# Patient Record
Sex: Male | Born: 1935 | Race: Black or African American | Hispanic: No | Marital: Married | Smoking: Never smoker
Health system: Southern US, Community
[De-identification: ages and names within clinical notes are randomized; demographics above are authoritative.]

## PROBLEM LIST (undated history)

## (undated) DIAGNOSIS — I639 Cerebral infarction, unspecified: Secondary | ICD-10-CM

## (undated) DIAGNOSIS — F0280 Dementia in other diseases classified elsewhere without behavioral disturbance: Secondary | ICD-10-CM

## (undated) DIAGNOSIS — F0390 Unspecified dementia without behavioral disturbance: Secondary | ICD-10-CM

## (undated) DIAGNOSIS — E119 Type 2 diabetes mellitus without complications: Secondary | ICD-10-CM

## (undated) NOTE — *Deleted (*Deleted)
Medical screening examination/treatment/procedure(s) were conducted as a shared visit with non-physician practitioner(s) and myself.  I personally evaluated the patient during the encounter  

---

## 2010-12-18 ENCOUNTER — Observation Stay: Payer: Self-pay | Admitting: *Deleted

## 2010-12-18 IMAGING — CT CT HEAD WITHOUT CONTRAST
2 series · 16 of 30 positions shown, 20 images · non-contrast
Comparison: none

REASON FOR EXAM: weakness
COMMENTS:

[Series 2: without · axial · non-contrast · 0.45mm/px · z∈[-140,-14]mm · 13 of 31 slices shown, 17 images]
[im 3/31  brain]
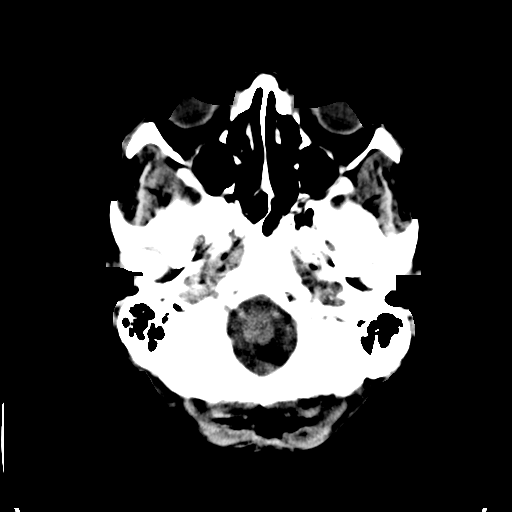
[im 3/31  bone]
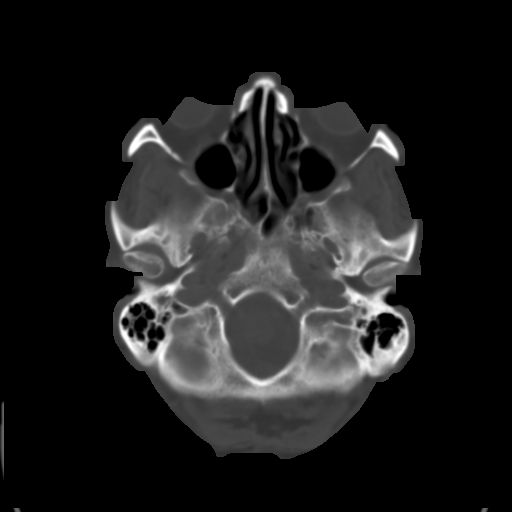
[im 5/31  brain]
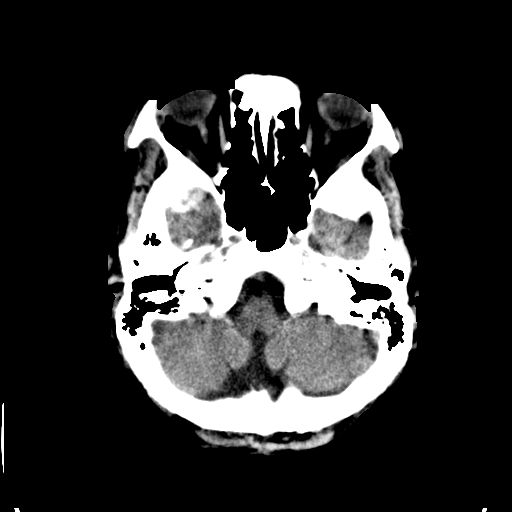
[im 7/31  brain]
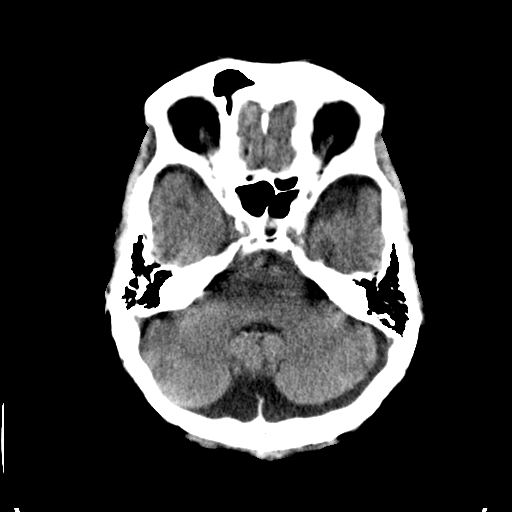
[im 9/31  brain]
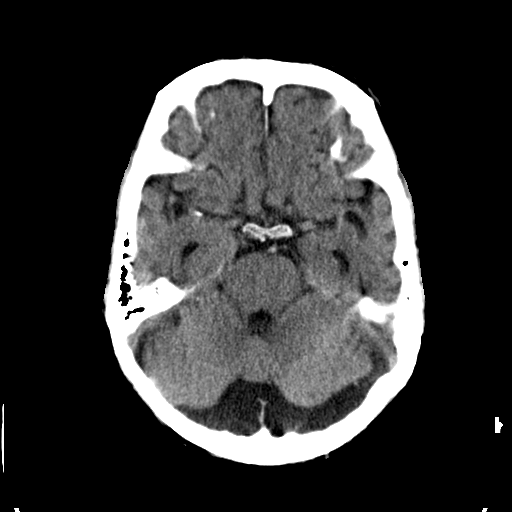
[im 11/31  brain]
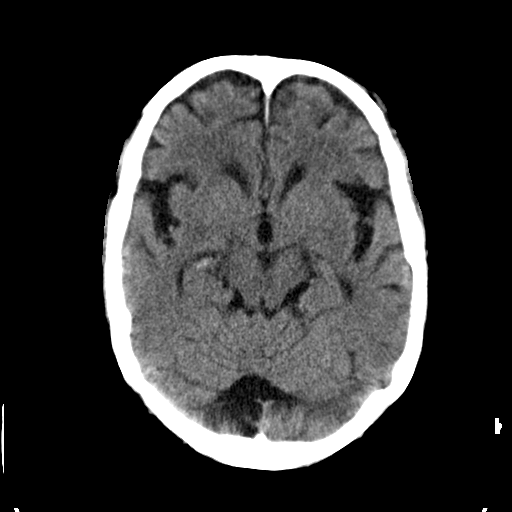
[im 11/31  bone]
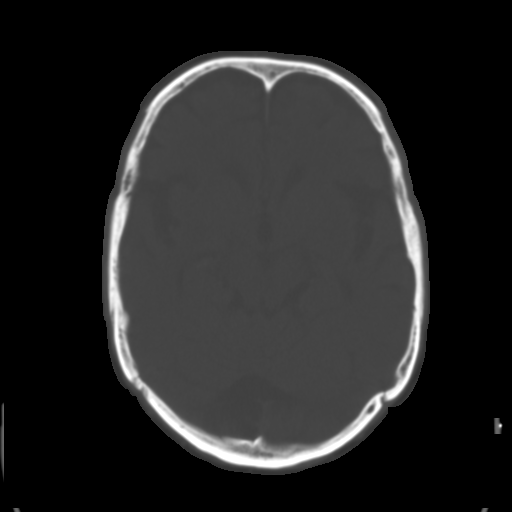
[im 13/31  brain]
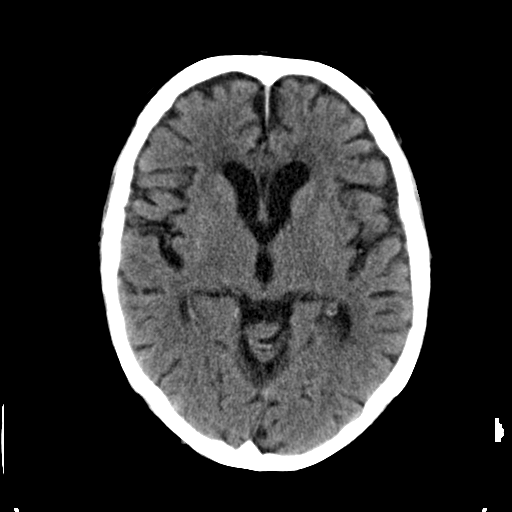
[im 16/31  brain]
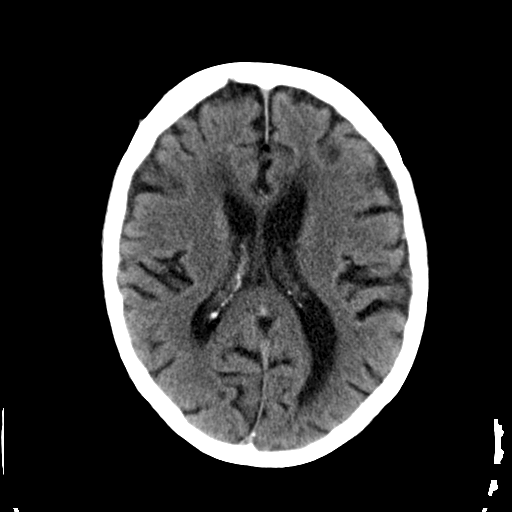
[im 18/31  brain]
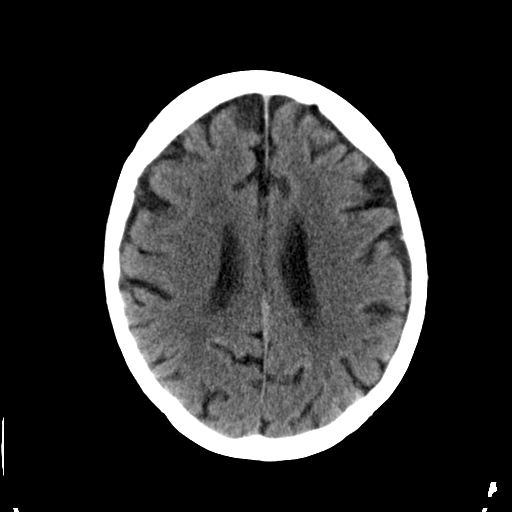
[im 20/31  brain]
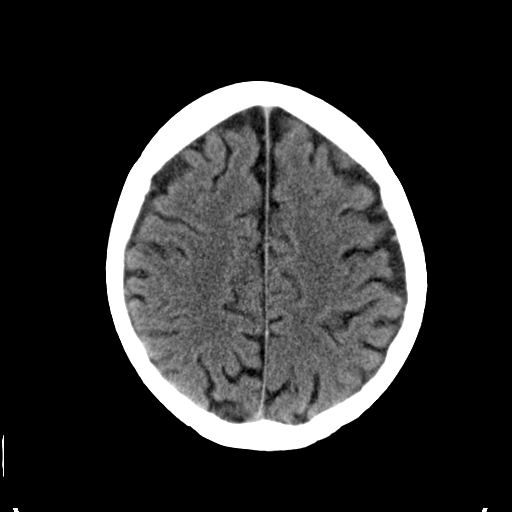
[im 20/31  bone]
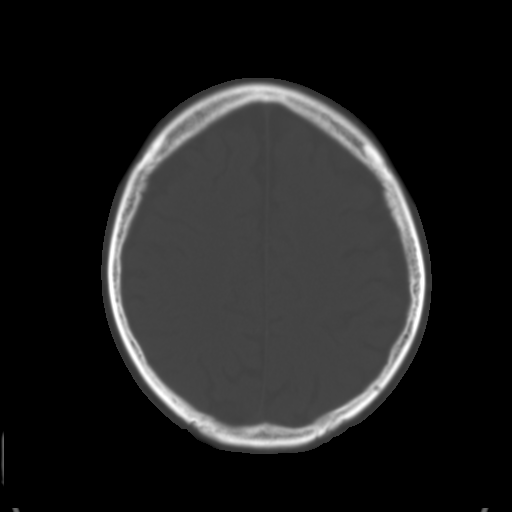
[im 22/31  brain]
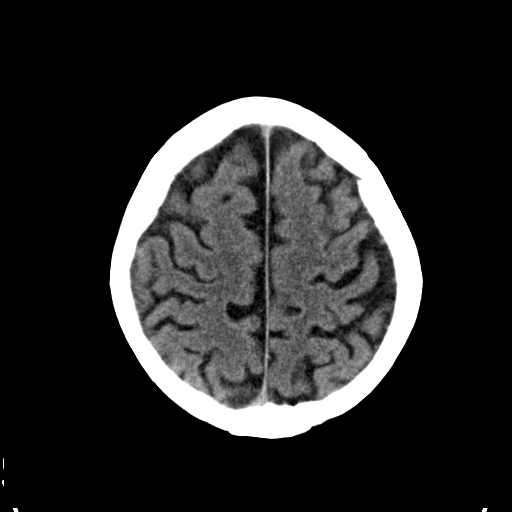
[im 24/31  brain]
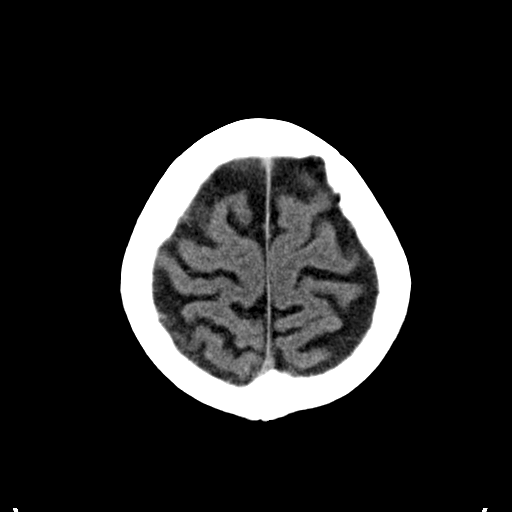
[im 26/31  brain]
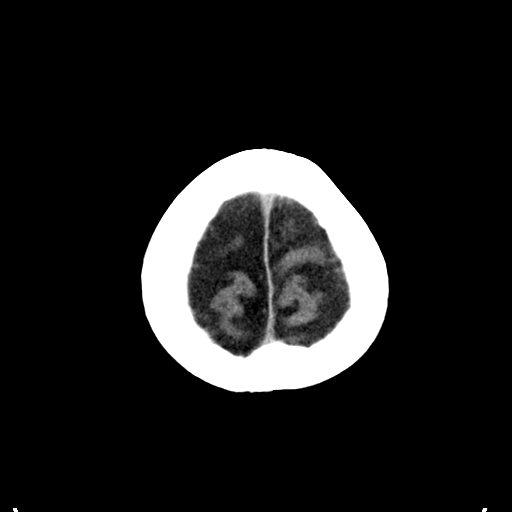
[im 28/31  brain]
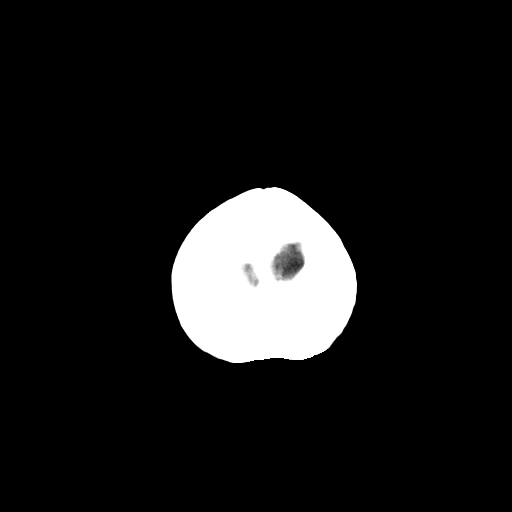
[im 28/31  bone]
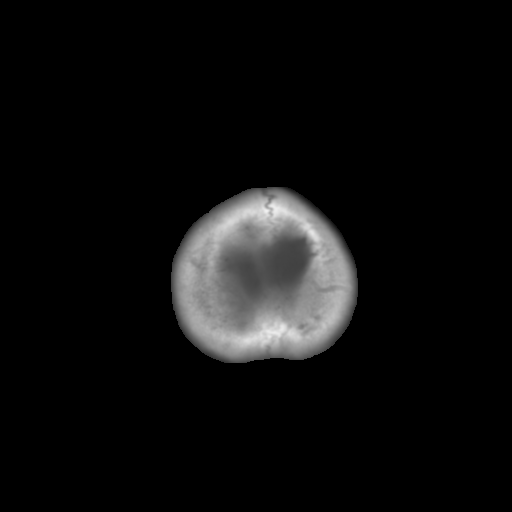

[Series 3: bone · axial · 0.45mm/px · z∈[-140,-100]mm · 3 of 31 slices shown]
[im 3/31  bone]
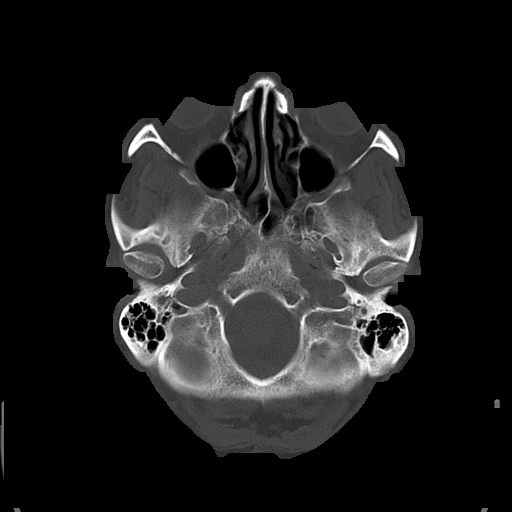
[im 7/31  bone]
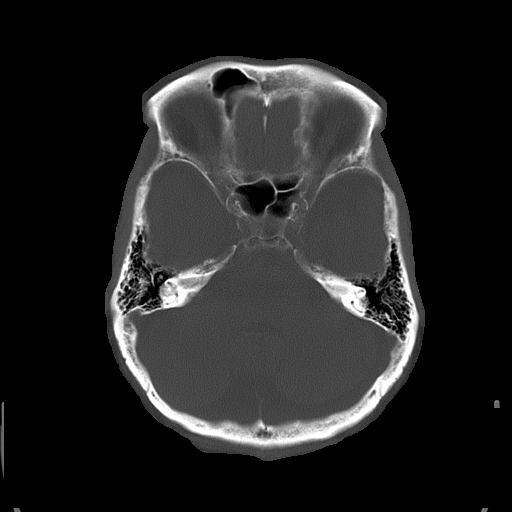
[im 11/31  bone]
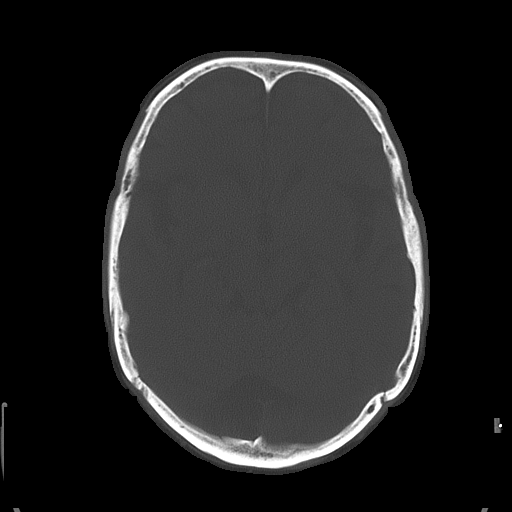

[16 of 30 positions shown; findings below may reference images not displayed]

PROCEDURE:     CT  - CT HEAD WITHOUT CONTRAST  - [DATE]  [DATE]

RESULT:     Emergent noncontrast CT of the brain demonstrates changes of
atrophy with mild prominence of the ventricles and sulci. There appears to
be a megacisterna magna or posterior fossa arachnoid cyst. There is no large
territorial infarct, hemorrhage, mass or mass effect. Low-attenuation in the
periventricular and subcortical white matter regions is most likely
secondary to chronic small vessel ischemic disease. The included sinuses and
mastoid air cells show grossly normal aeration. The left frontal sinus is
aplastic.
IMPRESSION: Changes of atrophy with chronic small vessel ischemic
disease. No acute intracranial abnormality evident.

## 2010-12-18 IMAGING — CR DG CHEST 2V
1 series · 2 of 2 positions shown · non-contrast
Comparison: none

REASON FOR EXAM: weakness
COMMENTS:

[Series 1: view not recorded · 0.17mm/px · 2 of 2 slices shown]
[im 1/2]
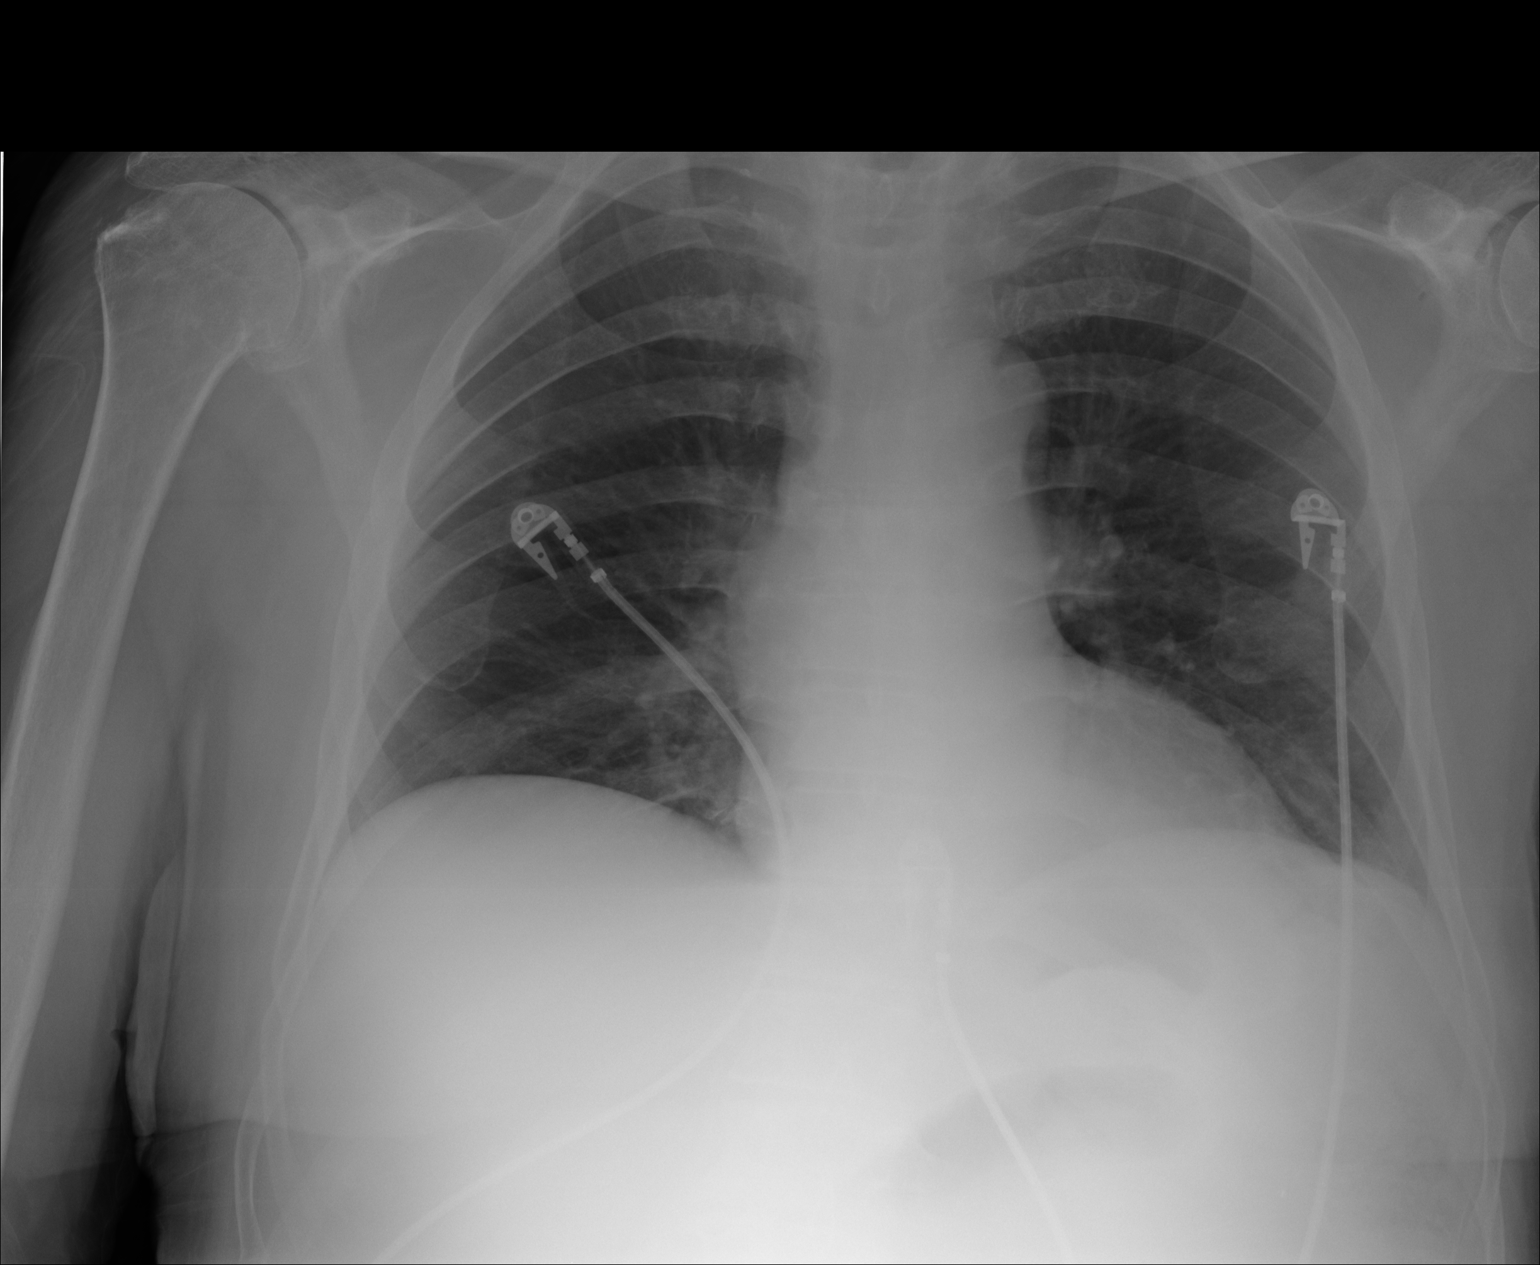
[im 2/2]
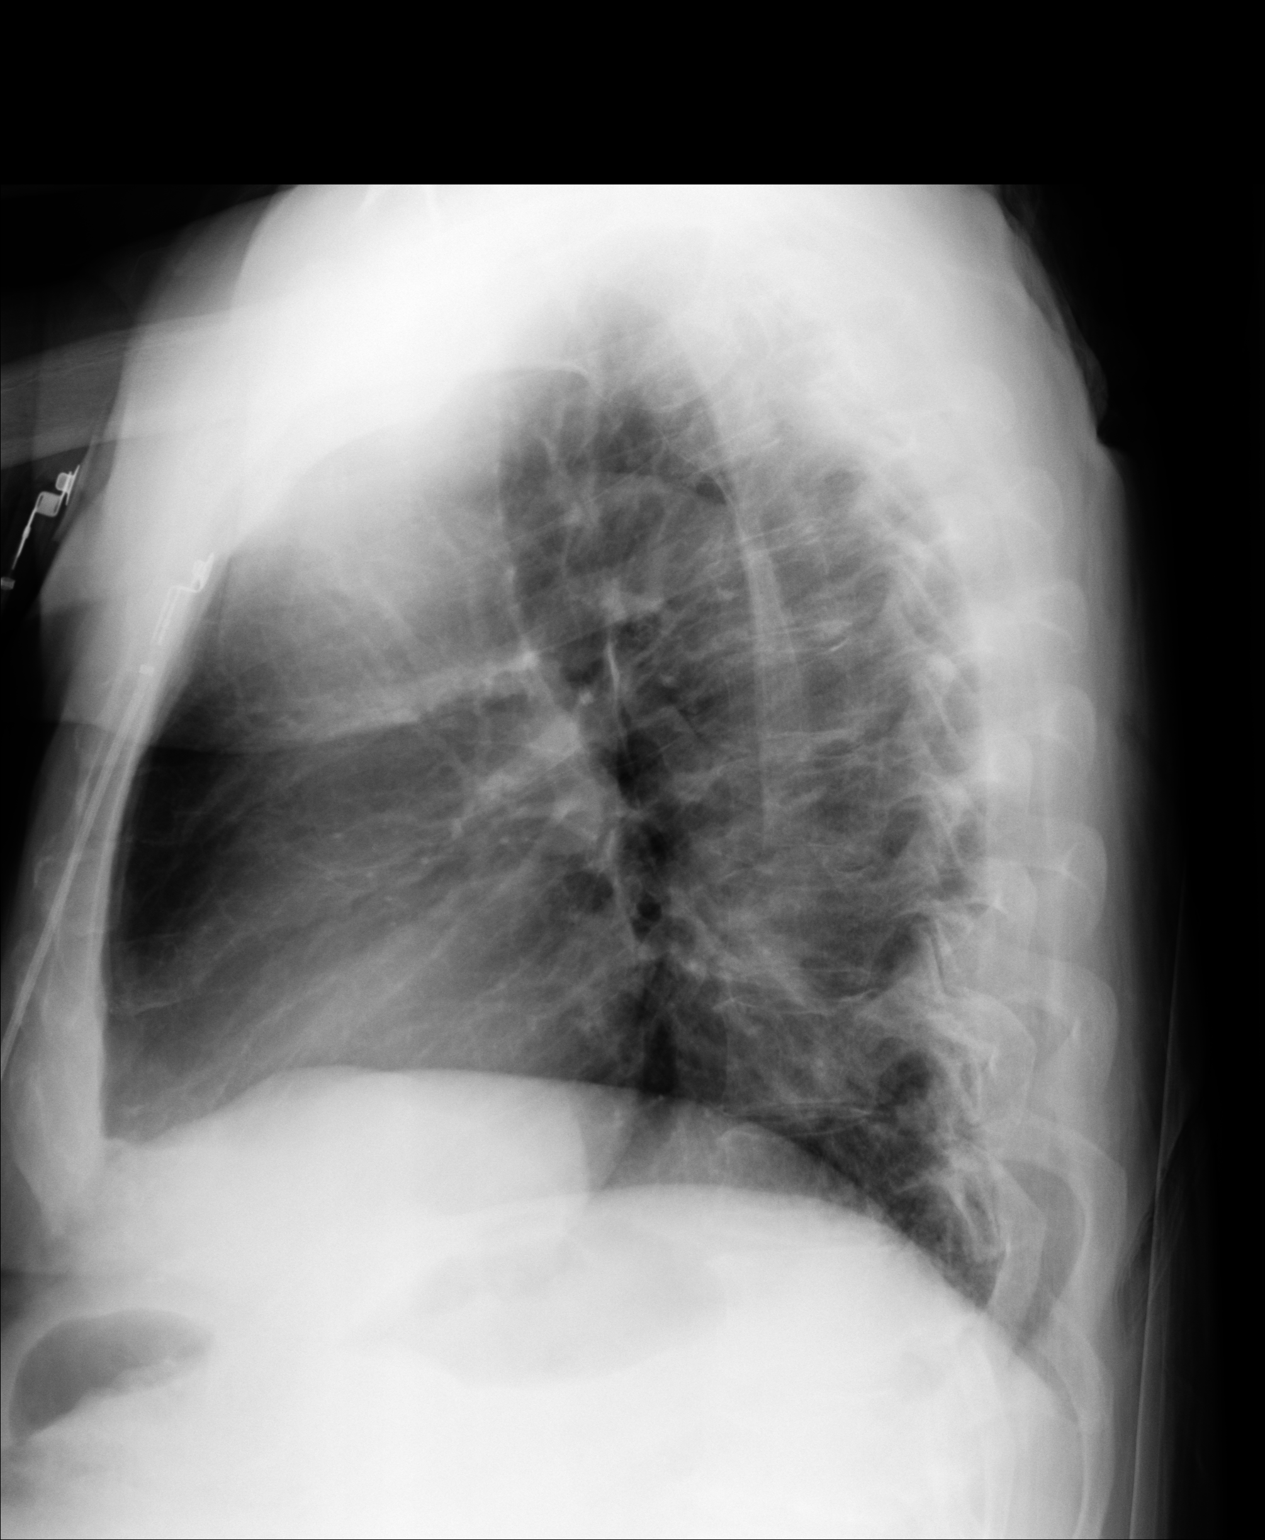

[2 of 2 positions shown; findings below may reference images not displayed]

PROCEDURE:     DXR - DXR CHEST PA (OR AP) AND LATERAL  - [DATE]  [DATE]

RESULT:     There is a slight increase in density lateral to the right heart
border and consistent with minimal fibrosis or atelectasis in the right
lower lobe. The lung fields otherwise are clear. No pneumonia, pneumothorax
or pleural effusion is seen. Heart size is normal. The mediastinal and
osseous structures show no acute changes.
IMPRESSION: 1. No acute changes are identified.
2. There is minimal discoid atelectasis or fibrosis in the right lower lobe.
3. Heart size is normal.

## 2019-01-19 ENCOUNTER — Other Ambulatory Visit: Payer: Self-pay

## 2019-01-19 ENCOUNTER — Emergency Department (HOSPITAL_COMMUNITY): Payer: Medicare Other

## 2019-01-19 ENCOUNTER — Encounter (HOSPITAL_COMMUNITY): Payer: Self-pay

## 2019-01-19 ENCOUNTER — Emergency Department (HOSPITAL_COMMUNITY)
Admission: EM | Admit: 2019-01-19 | Discharge: 2019-01-19 | Disposition: A | Discharge status: Home / Self Care | Payer: Medicare Other | Attending: Emergency Medicine | Admitting: Emergency Medicine

## 2019-01-19 DIAGNOSIS — W19XXXA Unspecified fall, initial encounter: Secondary | ICD-10-CM | POA: Insufficient documentation

## 2019-01-19 DIAGNOSIS — E119 Type 2 diabetes mellitus without complications: Secondary | ICD-10-CM | POA: Insufficient documentation

## 2019-01-19 DIAGNOSIS — F0280 Dementia in other diseases classified elsewhere without behavioral disturbance: Secondary | ICD-10-CM | POA: Diagnosis not present

## 2019-01-19 DIAGNOSIS — S0083XA Contusion of other part of head, initial encounter: Secondary | ICD-10-CM | POA: Diagnosis not present

## 2019-01-19 DIAGNOSIS — F0390 Unspecified dementia without behavioral disturbance: Secondary | ICD-10-CM | POA: Insufficient documentation

## 2019-01-19 DIAGNOSIS — Y999 Unspecified external cause status: Secondary | ICD-10-CM | POA: Insufficient documentation

## 2019-01-19 DIAGNOSIS — G309 Alzheimer's disease, unspecified: Secondary | ICD-10-CM | POA: Diagnosis not present

## 2019-01-19 DIAGNOSIS — Z7902 Long term (current) use of antithrombotics/antiplatelets: Secondary | ICD-10-CM | POA: Insufficient documentation

## 2019-01-19 DIAGNOSIS — Y939 Activity, unspecified: Secondary | ICD-10-CM | POA: Insufficient documentation

## 2019-01-19 DIAGNOSIS — S0993XA Unspecified injury of face, initial encounter: Secondary | ICD-10-CM | POA: Diagnosis present

## 2019-01-19 DIAGNOSIS — Y92129 Unspecified place in nursing home as the place of occurrence of the external cause: Secondary | ICD-10-CM | POA: Insufficient documentation

## 2019-01-19 HISTORY — DX: Dementia in other diseases classified elsewhere, unspecified severity, without behavioral disturbance, psychotic disturbance, mood disturbance, and anxiety: F02.80

## 2019-01-19 HISTORY — DX: Type 2 diabetes mellitus without complications: E11.9

## 2019-01-19 HISTORY — DX: Unspecified dementia, unspecified severity, without behavioral disturbance, psychotic disturbance, mood disturbance, and anxiety: F03.90

## 2019-01-19 LAB — CBC WITH DIFFERENTIAL/PLATELET
Abs Immature Granulocytes: 0.04 10*3/uL (ref 0.00–0.07)
Basophils Absolute: 0 10*3/uL (ref 0.0–0.1)
Basophils Relative: 0 %
Eosinophils Absolute: 0.1 10*3/uL (ref 0.0–0.5)
Eosinophils Relative: 1 %
HCT: 45.1 % (ref 39.0–52.0)
Hemoglobin: 14.2 g/dL (ref 13.0–17.0)
Immature Granulocytes: 1 %
Lymphocytes Relative: 20 %
Lymphs Abs: 1.7 10*3/uL (ref 0.7–4.0)
MCH: 28.6 pg (ref 26.0–34.0)
MCHC: 31.5 g/dL (ref 30.0–36.0)
MCV: 90.9 fL (ref 80.0–100.0)
Monocytes Absolute: 0.5 10*3/uL (ref 0.1–1.0)
Monocytes Relative: 6 %
Neutro Abs: 6.2 10*3/uL (ref 1.7–7.7)
Neutrophils Relative %: 72 %
Platelets: 223 10*3/uL (ref 150–400)
RBC: 4.96 MIL/uL (ref 4.22–5.81)
RDW: 13.1 % (ref 11.5–15.5)
WBC: 8.6 10*3/uL (ref 4.0–10.5)
nRBC: 0 % (ref 0.0–0.2)

## 2019-01-19 LAB — BASIC METABOLIC PANEL
Anion gap: 10 (ref 5–15)
BUN: 13 mg/dL (ref 8–23)
CO2: 25 mmol/L (ref 22–32)
Calcium: 9 mg/dL (ref 8.9–10.3)
Chloride: 102 mmol/L (ref 98–111)
Creatinine, Ser: 0.8 mg/dL (ref 0.61–1.24)
GFR calc Af Amer: >60 mL/min (ref >60)
GFR calc non Af Amer: >60 mL/min (ref >60)
Glucose, Bld: 266 mg/dL — ABNORMAL HIGH (ref 70–99)
Potassium: 4.5 mmol/L (ref 3.5–5.1)
Sodium: 137 mmol/L (ref 135–145)

## 2019-01-19 LAB — PROTIME-INR
INR: 1 (ref 0.8–1.2)
Prothrombin Time: 13.1 seconds (ref 11.4–15.2)

## 2019-01-19 IMAGING — CT CT HEAD W/O CM
3 of 4 series · 12 of 47 positions shown, 14 images · non-contrast
Comparison: [DATE]

CLINICAL DATA: Fall, headache, forehead hematoma

EXAM:
CT HEAD WITHOUT CONTRAST
CT CERVICAL SPINE WITHOUT CONTRAST
TECHNIQUE: Multidetector CT imaging of the head and cervical spine was
performed following the standard protocol without intravenous
contrast. Multiplanar CT image reconstructions of the cervical spine
were also generated.

[Series 3: head wo · axial · 0.36mm/px · z∈[-74,+70]mm · 7 of 42 slices shown, 9 images]
[im 6/42  brain]
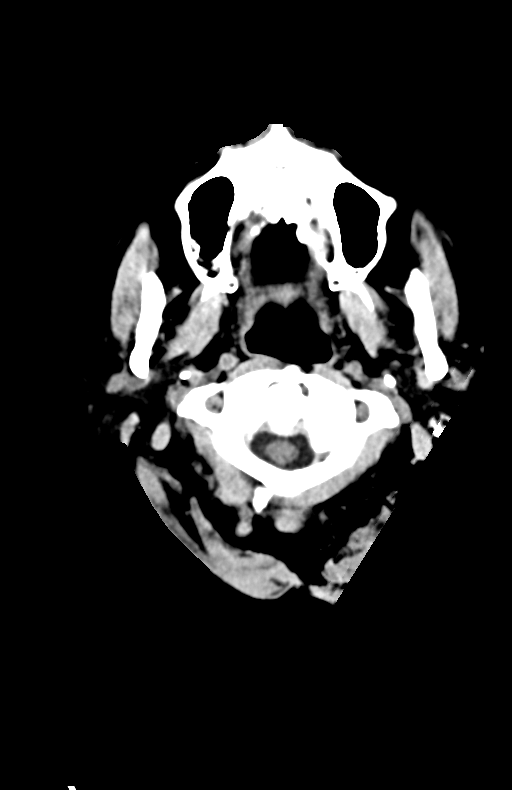
[im 6/42  bone]
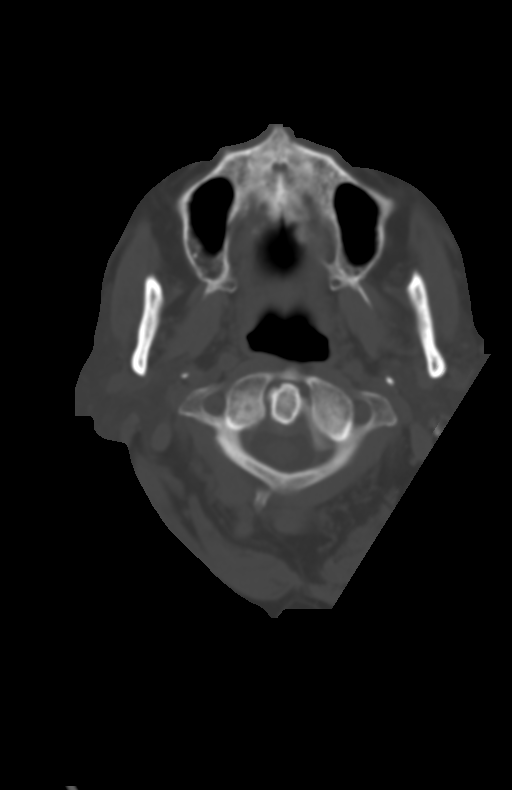
[im 11/42  brain]
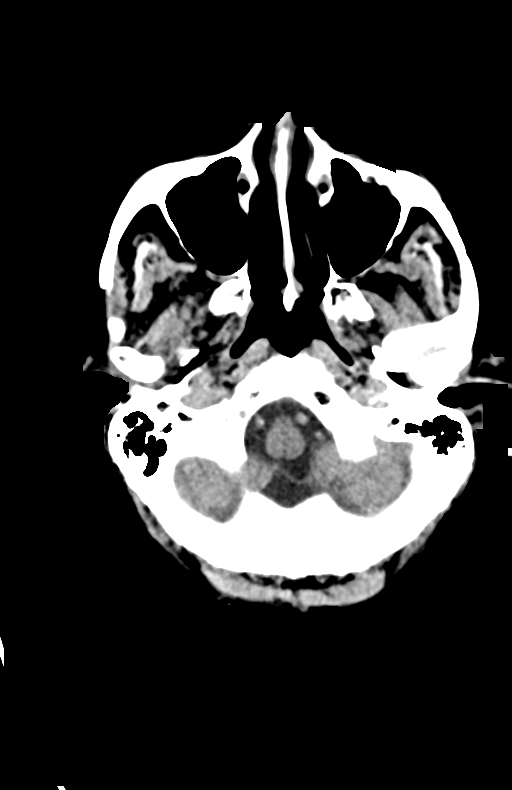
[im 16/42  brain]
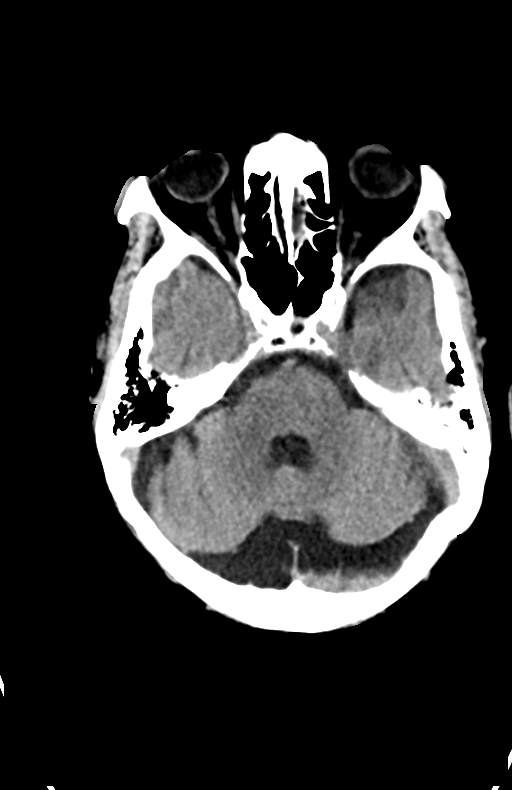
[im 21/42  brain]
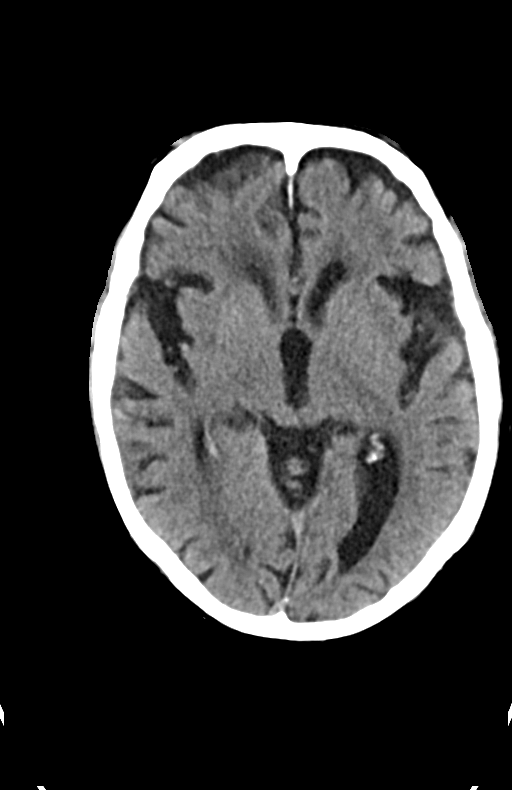
[im 26/42  brain]
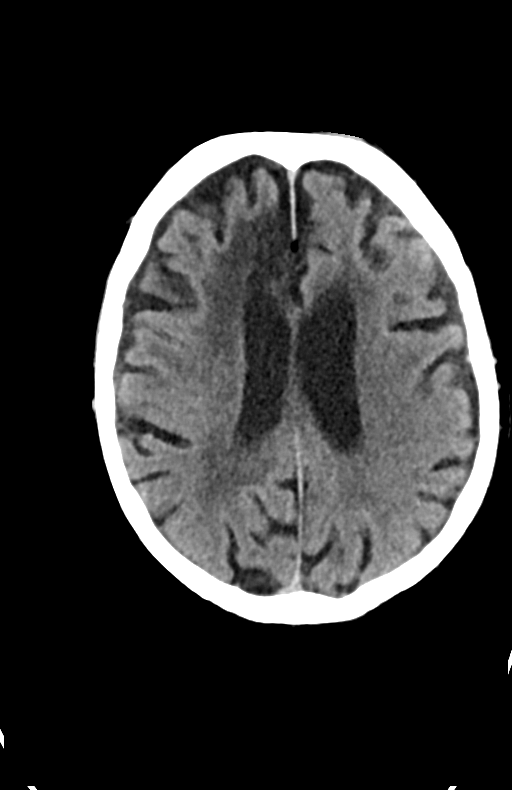
[im 26/42  bone]
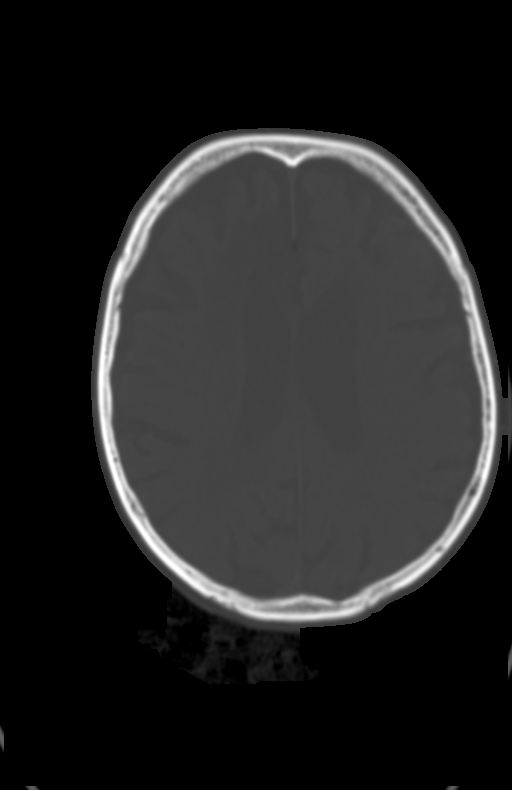
[im 31/42  brain]
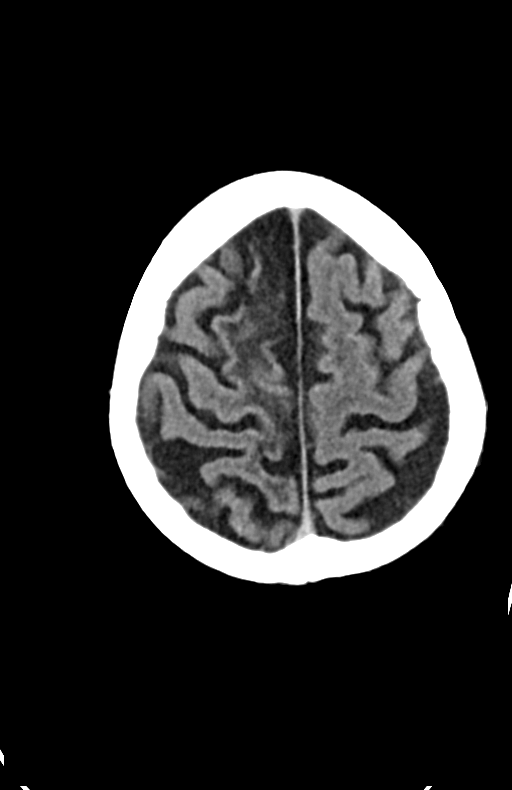
[im 36/42  brain]
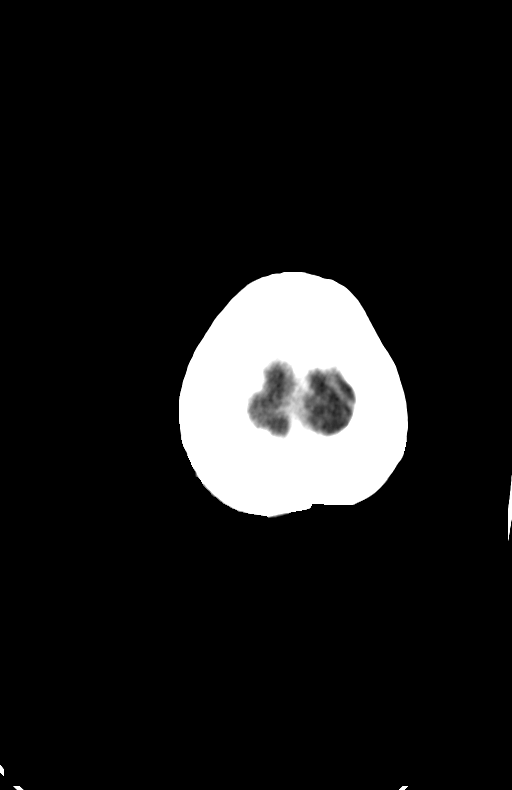

[Series 4: head bone · axial · 0.48mm/px · z∈[-115,-97]mm · 2 of 92 slices shown]
[im 10/92  bone]
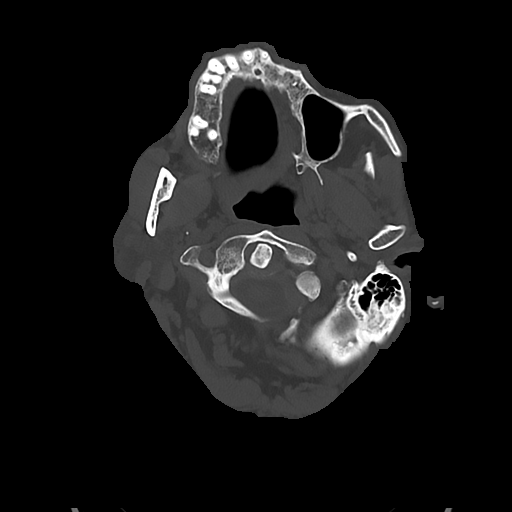
[im 19/92  bone]
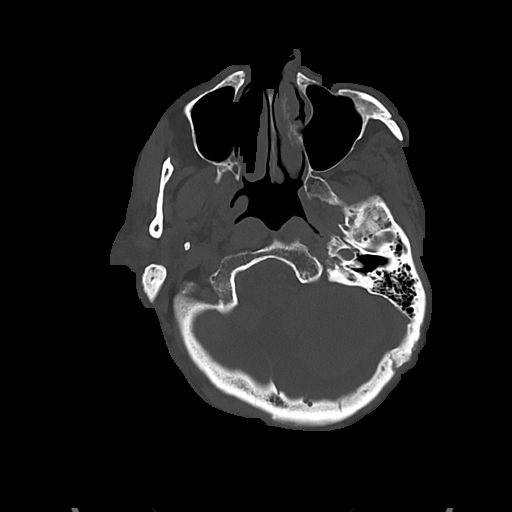

[Series 5: cor soft · coronal · 0.36mm/px · 3 of 96 slices shown]
[im 32/96  brain]
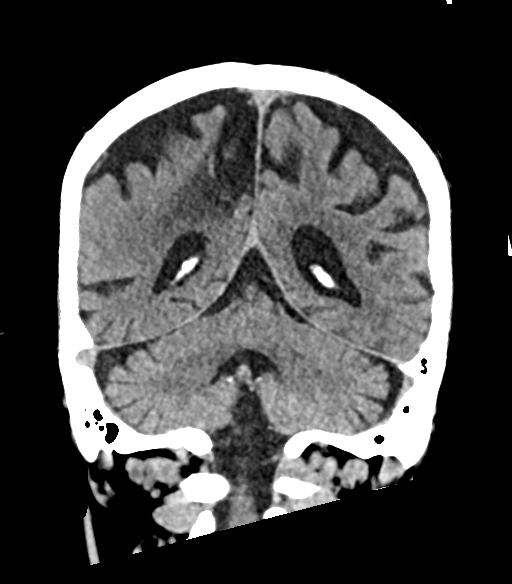
[im 43/96  brain]
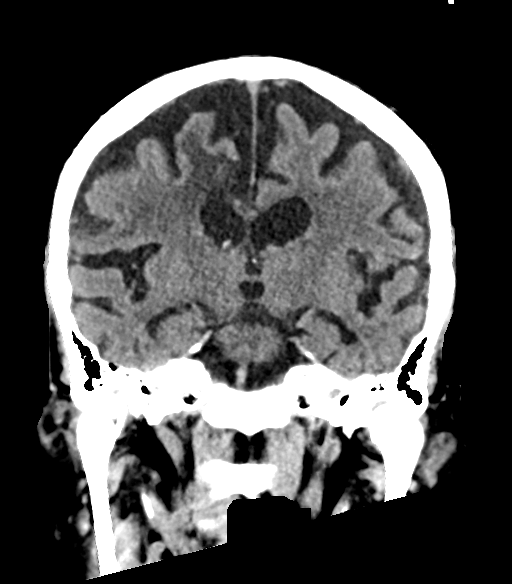
[im 53/96  brain]
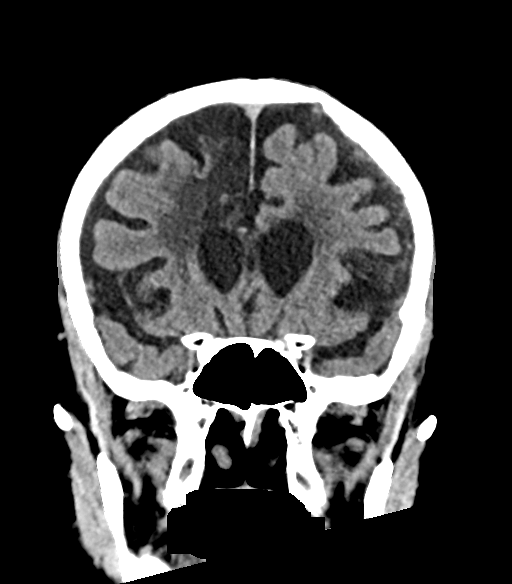

[12 of 47 positions shown; findings below may reference images not displayed]

FINDINGS: CT HEAD FINDINGS

Brain: No evidence of acute infarction, hemorrhage, hydrocephalus,
extra-axial collection or mass lesion/mass effect.

Global cortical atrophy. Subcortical white matter and
periventricular small vessel ischemic changes.

Old right ACA distribution infarct (series 3/image 115).

Vascular: Intracranial atherosclerosis.

Skull: Normal. Negative for fracture or focal lesion.

Sinuses/Orbits: The visualized paranasal sinuses are essentially
clear. The mastoid air cells are unopacified.

Other: Mild soft tissue swelling/hematoma overlying the left frontal
bone (series 3/image 16).

CT CERVICAL SPINE FINDINGS

Alignment: Normal cervical lordosis.

Skull base and vertebrae: No acute fracture. No primary bone lesion
or focal pathologic process.

Soft tissues and spinal canal: No prevertebral fluid or swelling. No
visible canal hematoma.

Disc levels: Mild degenerative changes of the mid/lower cervical
spine. Spinal canal is patent.

Upper chest: Visualized lung apices are clear.

Other: 14 mm right thyroid nodule.
IMPRESSION: Mild soft tissue swelling/extracranial hematoma overlying the left
frontal bone. No evidence of calvarial fracture.

No evidence of acute intracranial abnormality. Atrophy with small
vessel ischemic changes. Old right ACA distribution infarct.

No evidence of traumatic injury to the cervical spine. Mild
degenerative changes of the mid/lower cervical spine.

## 2019-01-19 IMAGING — DX DG PELVIS 1-2V
1 series · 1 of 1 positions shown · non-contrast
Comparison: None.

CLINICAL DATA: Unwitnessed fall

EXAM:
PELVIS - 1-2 VIEW

[pelvis]
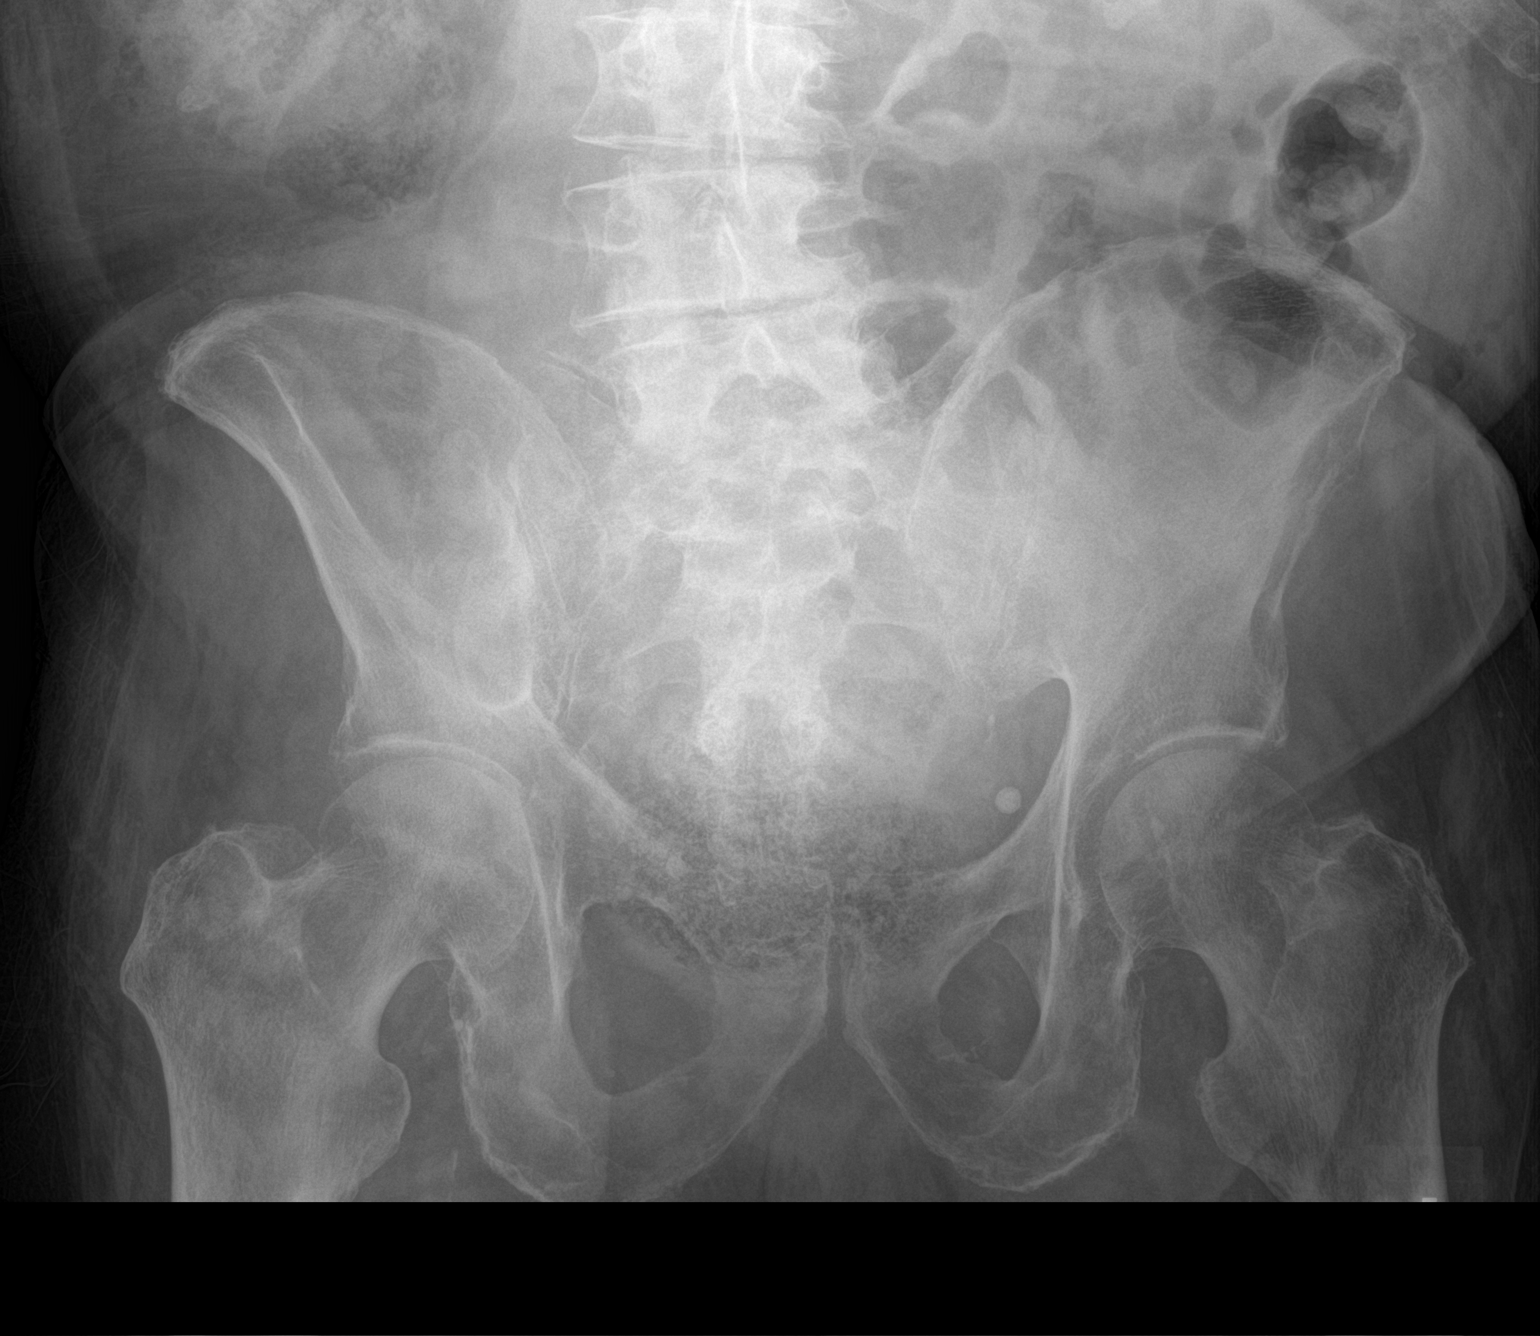

[1 of 1 positions shown; findings below may reference images not displayed]

FINDINGS: There is no acute displaced fracture. No dislocation. There are mild
degenerative changes of both hips. There is a moderate amount of
stool in the colon.
IMPRESSION: No acute osseous abnormality.

## 2019-01-19 IMAGING — DX DG CHEST 1V
1 series · 1 of 1 positions shown · non-contrast
Comparison: [DATE]

CLINICAL DATA: Unwitnessed fall

EXAM:
CHEST  1 VIEW

[chest]
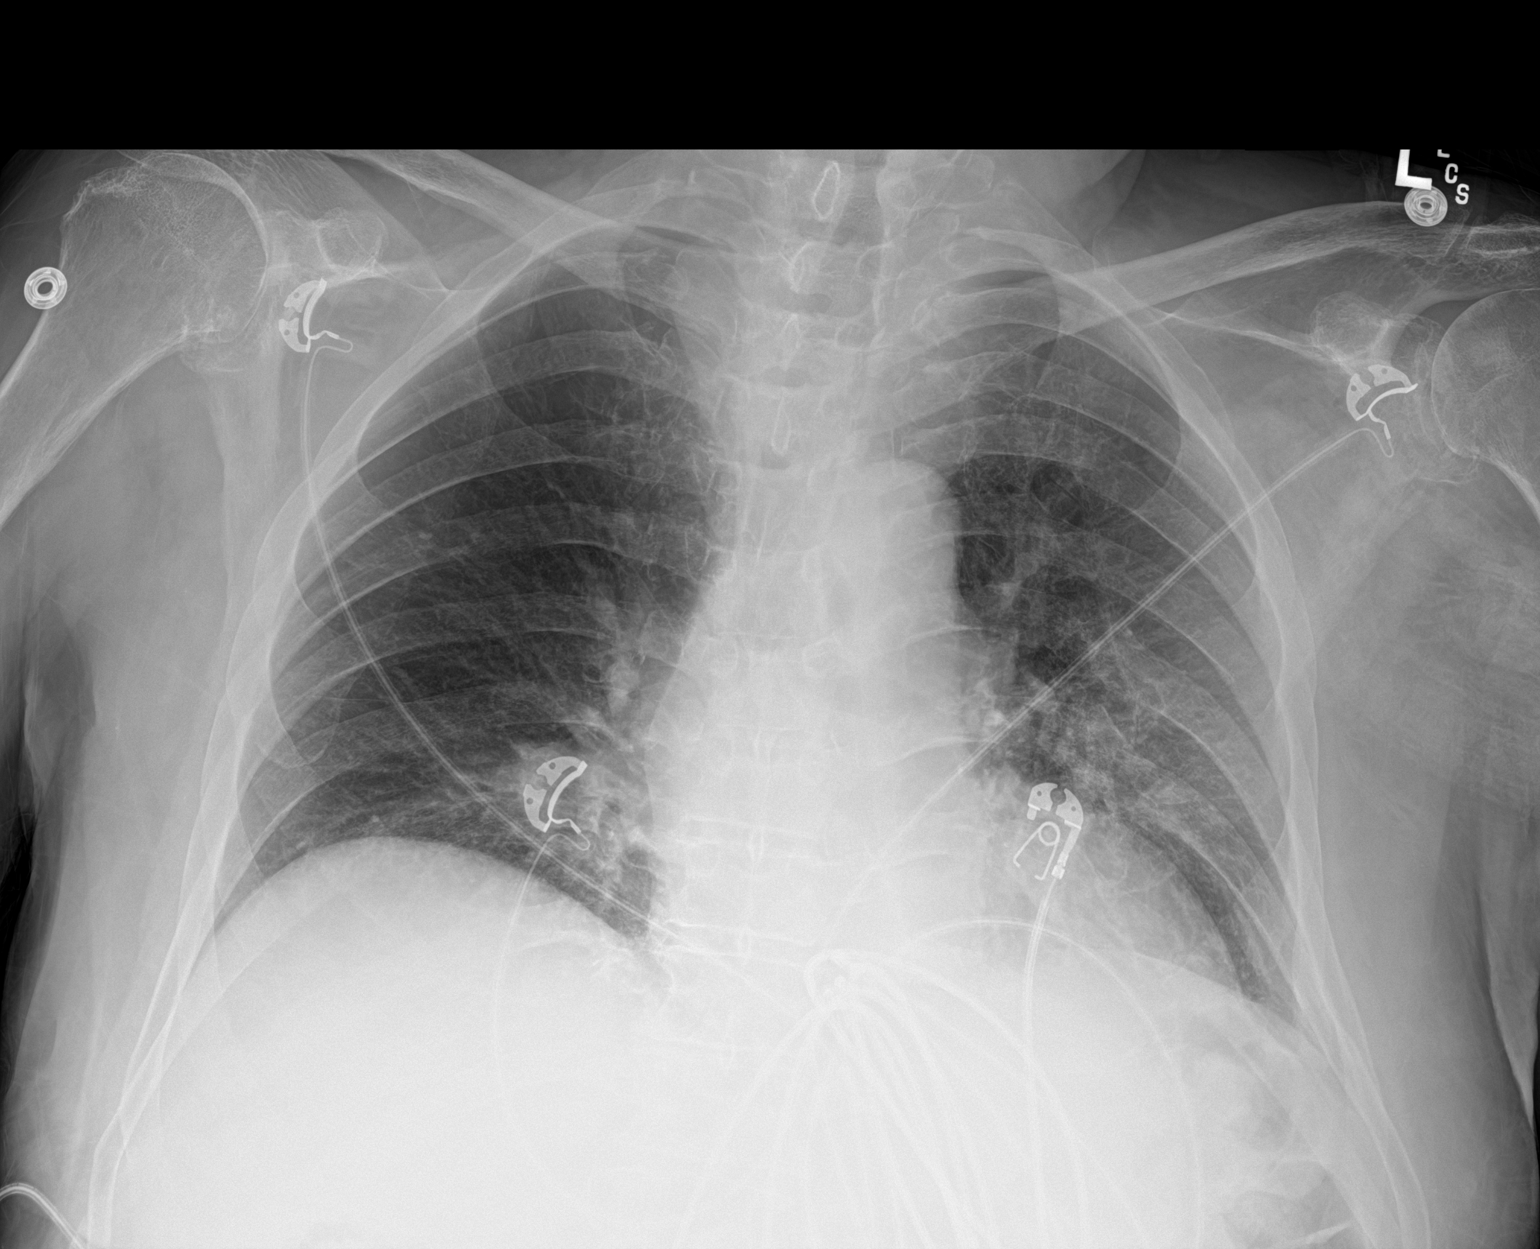

[1 of 1 positions shown; findings below may reference images not displayed]

FINDINGS: The heart size is stable from prior study. There is no pneumothorax.
No large pleural effusion. No acute osseous abnormality. Advanced
degenerative changes are noted of both glenohumeral joints. There is
atelectasis at the lung bases.
IMPRESSION: No active disease.

## 2019-01-19 IMAGING — CT CT CERVICAL SPINE W/O CM
4 series · 15 of 35 positions shown, 18 images · non-contrast
Comparison: [DATE]

CLINICAL DATA: Fall, headache, forehead hematoma

EXAM:
CT HEAD WITHOUT CONTRAST
CT CERVICAL SPINE WITHOUT CONTRAST
TECHNIQUE: Multidetector CT imaging of the head and cervical spine was
performed following the standard protocol without intravenous
contrast. Multiplanar CT image reconstructions of the cervical spine
were also generated.

[Series 5: c spine soft · axial · 0.38mm/px · z∈[-240,-206]mm · 2 of 101 slices shown]
[im 17/101  soft-tissue]
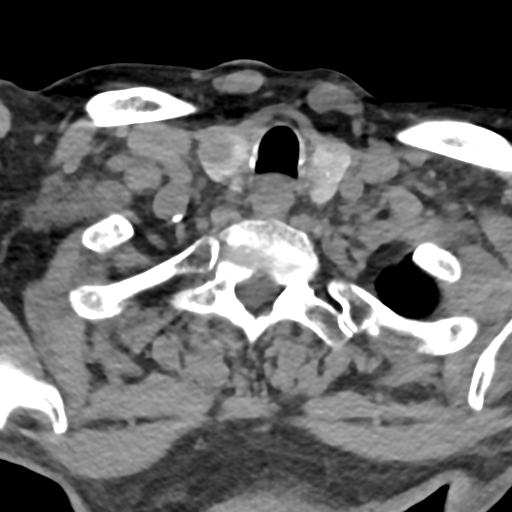
[im 34/101  soft-tissue]
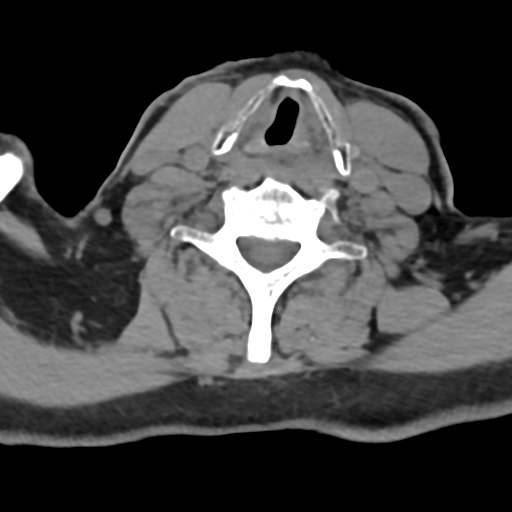

[Series 8: sag bone · sagittal · 0.45mm/px · 5 of 101 slices shown, 6 images]
[im 34/101  bone]
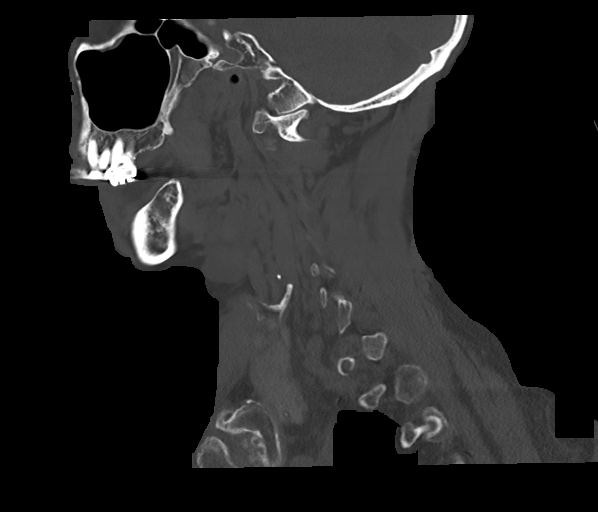
[im 42/101  bone]
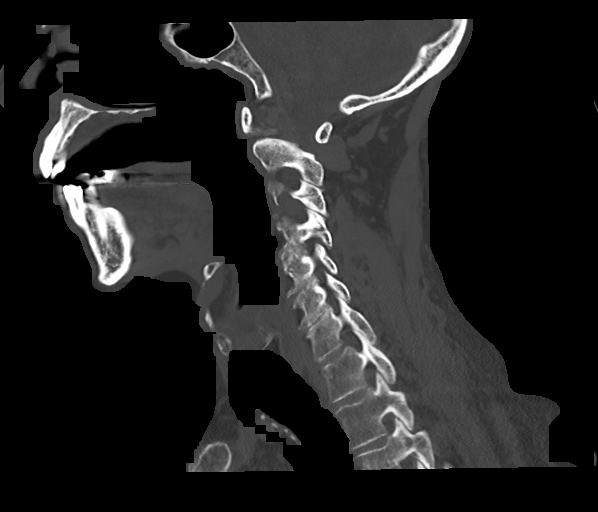
[im 51/101  soft-tissue]
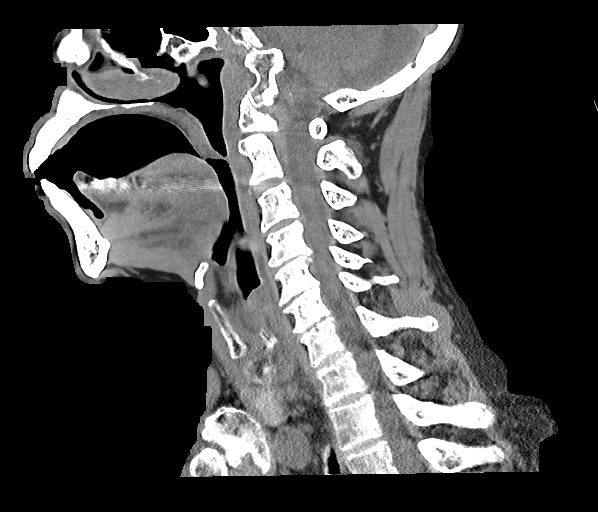
[im 51/101  bone]
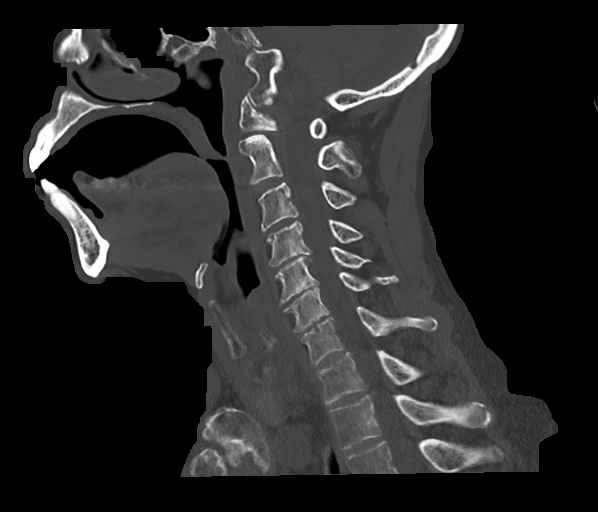
[im 59/101  bone]
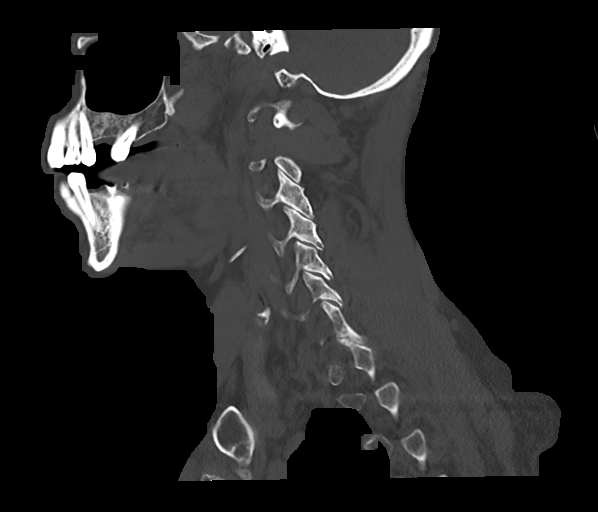
[im 67/101  bone]
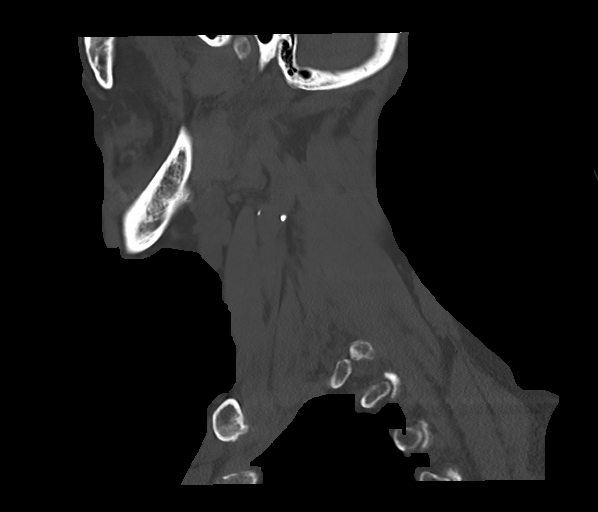

[Series 9: cor bone · coronal · 0.42mm/px · 3 of 134 slices shown]
[im 27/134  bone]
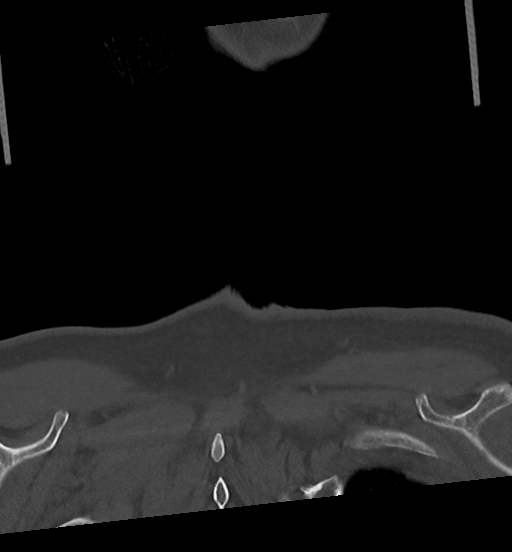
[im 54/134  bone]
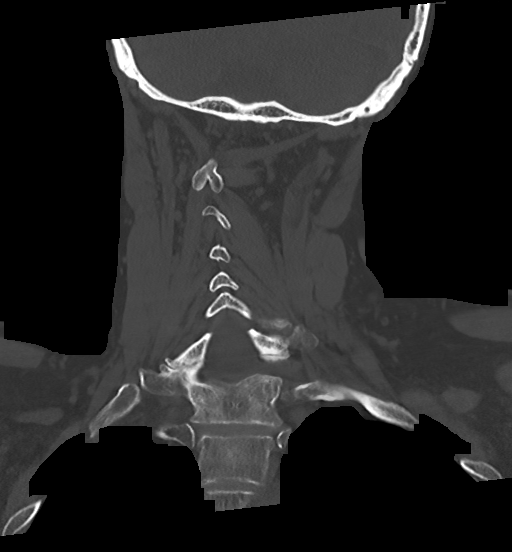
[im 80/134  bone]
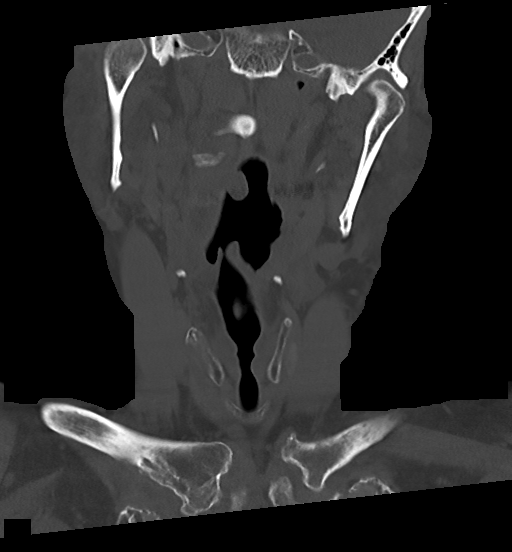

[Series 10: orthogonal axials · axial · 0.21mm/px · z∈[-253,-130]mm · 5 of 100 slices shown, 7 images]
[im 17/100  soft-tissue]
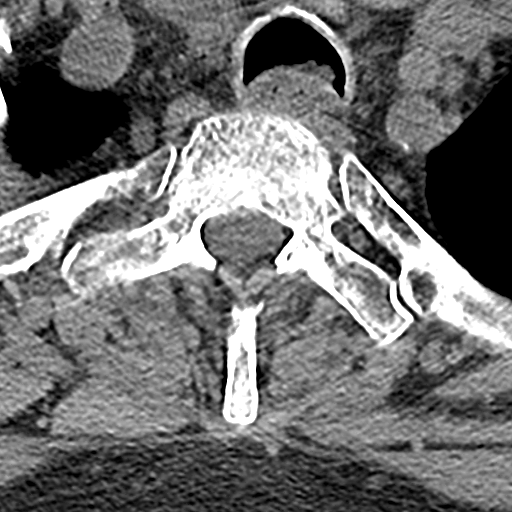
[im 17/100  bone]
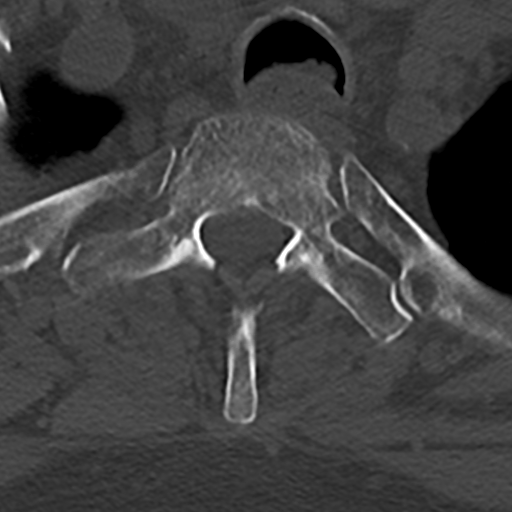
[im 34/100  bone]
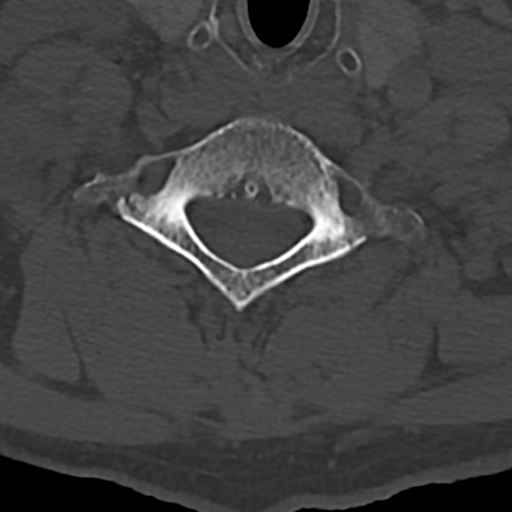
[im 50/100  bone]
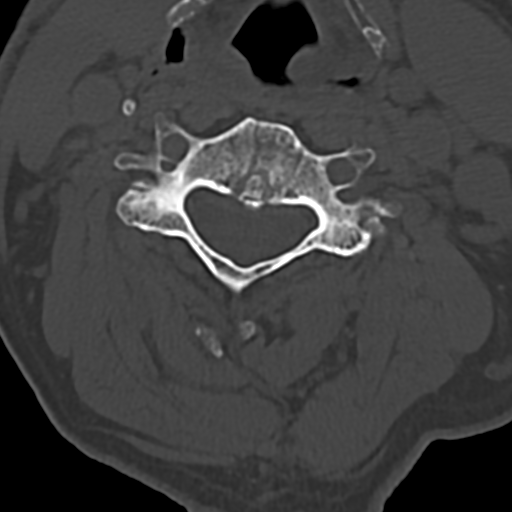
[im 67/100  bone]
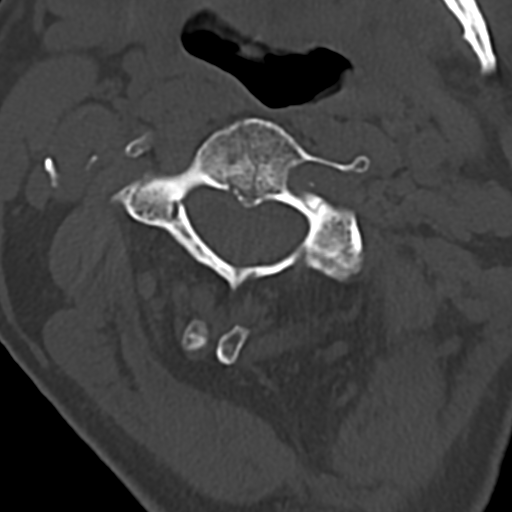
[im 83/100  soft-tissue]
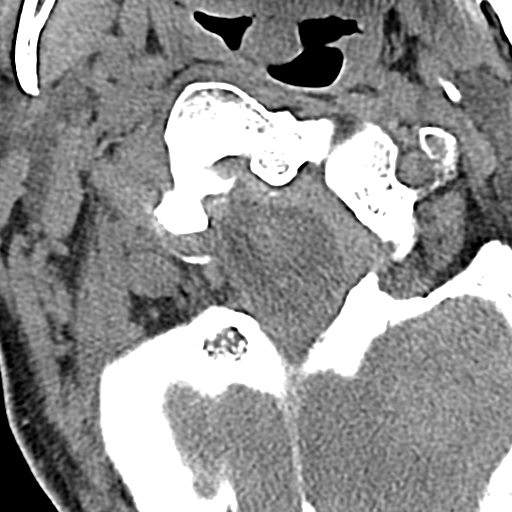
[im 83/100  bone]
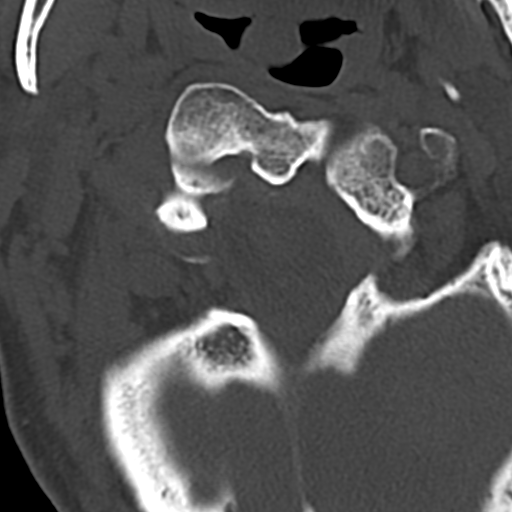

[15 of 35 positions shown; findings below may reference images not displayed]

FINDINGS: CT HEAD FINDINGS

Brain: No evidence of acute infarction, hemorrhage, hydrocephalus,
extra-axial collection or mass lesion/mass effect.

Global cortical atrophy. Subcortical white matter and
periventricular small vessel ischemic changes.

Old right ACA distribution infarct (series 3/image 115).

Vascular: Intracranial atherosclerosis.

Skull: Normal. Negative for fracture or focal lesion.

Sinuses/Orbits: The visualized paranasal sinuses are essentially
clear. The mastoid air cells are unopacified.

Other: Mild soft tissue swelling/hematoma overlying the left frontal
bone (series 3/image 16).

CT CERVICAL SPINE FINDINGS

Alignment: Normal cervical lordosis.

Skull base and vertebrae: No acute fracture. No primary bone lesion
or focal pathologic process.

Soft tissues and spinal canal: No prevertebral fluid or swelling. No
visible canal hematoma.

Disc levels: Mild degenerative changes of the mid/lower cervical
spine. Spinal canal is patent.

Upper chest: Visualized lung apices are clear.

Other: 14 mm right thyroid nodule.
IMPRESSION: Mild soft tissue swelling/extracranial hematoma overlying the left
frontal bone. No evidence of calvarial fracture.

No evidence of acute intracranial abnormality. Atrophy with small
vessel ischemic changes. Old right ACA distribution infarct.

No evidence of traumatic injury to the cervical spine. Mild
degenerative changes of the mid/lower cervical spine.

## 2019-01-19 NOTE — ED Provider Notes (Signed)
[facility] EMERGENCY DEPARTMENT Provider Note   CSN: [id number] Arrival date & time: 01/19/19  1740     History   Chief Complaint Chief Complaint  Patient presents with  . Fall    HPI Jeffery Haynes is a 85 y.o. male.  Presents to ER after fall.  Patient has history of dementia, resides at [facility].  Unwitnessed fall, noted head trauma.  Patient is on Plavix.  At time of interview, patient states that he has no acute complaints.  Specifically he denies any new neck, back, chest or abdominal pain.  History limited due to patient's baseline dementia.  Level 5 caveat.     HPI  Past Medical History:  Diagnosis Date  . Alzheimer disease (HCC)   . Dementia (HCC)   . Diabetes mellitus without complication [facility])     Patient Active Problem List   Diagnosis Date Noted  . Alzheimer disease (HCC)   . Dementia (HCC)      Home Medications    Prior to Admission medications   Not on File    Family History No family history on file.  Social History Social History   Tobacco Use  . Smoking status: Not on file  Substance Use Topics  . Alcohol use: Not on file  . Drug use: Not on file     Allergies   Patient has no known allergies.   Review of Systems Review of Systems  Unable to perform ROS: Dementia     Physical Exam Updated Vital Signs BP 113/82   Pulse 70   Temp 98.8 F (37.1 C) (Oral)   Resp 20   Ht 6' (1.829 m)   Wt 73.5 kg   SpO2 96%   BMI 21.97 kg/m   Physical Exam Vitals signs and nursing note reviewed.  Constitutional:      Appearance: He is well-developed.     Comments: Pleasantly demented gentleman lying in bed in no acute distress  HENT:     Head: Normocephalic.     Comments: 2 cm diameter hematoma noted over left forehead, no laceration Eyes:     Conjunctiva/sclera: Conjunctivae normal.  Neck:     Musculoskeletal: Neck supple.  Cardiovascular:     Rate and Rhythm: Normal rate and regular rhythm.   Heart sounds: No murmur.  Pulmonary:     Effort: Pulmonary effort is normal. No respiratory distress.     Breath sounds: Normal breath sounds.  Abdominal:     Palpations: Abdomen is soft.     Tenderness: There is no abdominal tenderness.  Musculoskeletal:     Comments: No tenderness palpation throughout all 4 extremities, no deformities noted,  No tenderness palpation over C, T, L-spine, no deformity or step-off appreciated  Skin:    General: Skin is warm and dry.  Neurological:     Mental Status: He is alert.     Comments: Alert, baseline dementia, answers basic questions, follows commands      ED Treatments / Results  Labs (all labs ordered are listed, but only abnormal results are displayed) Labs Reviewed  BASIC METABOLIC PANEL - Abnormal; Notable for the following components:      Result Value   Glucose, Bld 266 (*)    All other components within normal limits  CBC WITH DIFFERENTIAL/PLATELET  PROTIME-INR    EKG None  Radiology No results found.  Procedures Procedures (including critical care time)  Medications Ordered in ED Medications - No data to display   Initial Impression /  Assessment and Plan / ED Course  I have reviewed the triage vital signs and the nursing notes.  Pertinent labs & imaging results that were available during my care of the patient were reviewed by me and considered in my medical decision making (see chart for details).        85 year old male presents to ER after unwitnessed fall.  History of dementia, patient is at baseline mental status per facility report.  He is pleasant, noted normal vital signs, small hematoma to forehead but no other trauma on my exam.  Screening labs given unwitnessed fall were negative.  CT head and C-spine were negative.  Screening chest and pelvis x-rays given unwitnessed fall also were negative.  Believe patient is appropriate for discharge at this time.  Updated wife on patient's status.    After the  discussed management above, the patient was determined to be safe for discharge.  The patient was in agreement with this plan and all questions regarding their care were answered.  ED return precautions were discussed and the patient will return to the ED with any significant worsening of condition.    Final Clinical Impressions(s) / ED Diagnoses   Final diagnoses:  Traumatic hematoma of forehead, initial encounter    ED Discharge Orders    None       Lucrezia Starch, MD 01/20/19 [phone]

## 2019-01-19 NOTE — ED Notes (Signed)
[facility] called for transport of patient

## 2019-01-19 NOTE — ED Notes (Signed)
Report given to [facility] LPN at [facility].

## 2019-01-19 NOTE — Discharge Instructions (Signed)
Recommend follow-up with primary doctor.  Return if he develops any difficulty breathing, chest pain or abdominal pain or increased confusion.

## 2019-01-19 NOTE — ED Triage Notes (Addendum)
Pt to ED via EMS from nursing home [facility] d/t unwitnessed fall. Pt uses walker to assist with ambulation. Pt taking blood thinners, on Plavix, hematoma noted to left frontal . Per facility pt orientation at baseline. HX dementia per facility paperwork

## 2019-01-19 NOTE — ED Notes (Signed)
Jeffery Haynes [phone], wife. Asking for an update

## 2019-09-28 ENCOUNTER — Emergency Department (HOSPITAL_COMMUNITY): Payer: Medicare Other

## 2019-09-28 ENCOUNTER — Other Ambulatory Visit: Payer: Self-pay

## 2019-09-28 ENCOUNTER — Encounter (HOSPITAL_COMMUNITY): Payer: Self-pay | Admitting: Emergency Medicine

## 2019-09-28 ENCOUNTER — Emergency Department (HOSPITAL_COMMUNITY)
Admission: EM | Admit: 2019-09-28 | Discharge: 2019-09-28 | Disposition: A | Discharge status: Home / Self Care | Payer: Medicare Other | Attending: Emergency Medicine | Admitting: Emergency Medicine

## 2019-09-28 DIAGNOSIS — R55 Syncope and collapse: Secondary | ICD-10-CM | POA: Diagnosis not present

## 2019-09-28 DIAGNOSIS — M25512 Pain in left shoulder: Secondary | ICD-10-CM | POA: Diagnosis not present

## 2019-09-28 DIAGNOSIS — G309 Alzheimer's disease, unspecified: Secondary | ICD-10-CM | POA: Insufficient documentation

## 2019-09-28 DIAGNOSIS — E119 Type 2 diabetes mellitus without complications: Secondary | ICD-10-CM | POA: Insufficient documentation

## 2019-09-28 LAB — COMPREHENSIVE METABOLIC PANEL
ALT: 13 U/L (ref 0–44)
AST: 19 U/L (ref 15–41)
Albumin: 3.5 g/dL (ref 3.5–5.0)
Alkaline Phosphatase: 47 U/L (ref 38–126)
Anion gap: 9 (ref 5–15)
BUN: 16 mg/dL (ref 8–23)
CO2: 25 mmol/L (ref 22–32)
Calcium: 8.9 mg/dL (ref 8.9–10.3)
Chloride: 105 mmol/L (ref 98–111)
Creatinine, Ser: 0.74 mg/dL (ref 0.61–1.24)
GFR calc Af Amer: >60 mL/min (ref >60)
GFR calc non Af Amer: >60 mL/min (ref >60)
Glucose, Bld: 152 mg/dL — ABNORMAL HIGH (ref 70–99)
Potassium: 3.9 mmol/L (ref 3.5–5.1)
Sodium: 139 mmol/L (ref 135–145)
Total Bilirubin: 0.5 mg/dL (ref 0.3–1.2)
Total Protein: 5.8 g/dL — ABNORMAL LOW (ref 6.5–8.1)

## 2019-09-28 LAB — CBC WITH DIFFERENTIAL/PLATELET
Abs Immature Granulocytes: 0.04 10*3/uL (ref 0.00–0.07)
Basophils Absolute: 0 10*3/uL (ref 0.0–0.1)
Basophils Relative: 0 %
Eosinophils Absolute: 0.1 10*3/uL (ref 0.0–0.5)
Eosinophils Relative: 1 %
HCT: 43 % (ref 39.0–52.0)
Hemoglobin: 13.3 g/dL (ref 13.0–17.0)
Immature Granulocytes: 1 %
Lymphocytes Relative: 18 %
Lymphs Abs: 1.4 10*3/uL (ref 0.7–4.0)
MCH: 29.2 pg (ref 26.0–34.0)
MCHC: 30.9 g/dL (ref 30.0–36.0)
MCV: 94.3 fL (ref 80.0–100.0)
Monocytes Absolute: 0.5 10*3/uL (ref 0.1–1.0)
Monocytes Relative: 6 %
Neutro Abs: 5.8 10*3/uL (ref 1.7–7.7)
Neutrophils Relative %: 74 %
Platelets: 244 10*3/uL (ref 150–400)
RBC: 4.56 MIL/uL (ref 4.22–5.81)
RDW: 13.5 % (ref 11.5–15.5)
WBC: 7.8 10*3/uL (ref 4.0–10.5)
nRBC: 0 % (ref 0.0–0.2)

## 2019-09-28 LAB — TROPONIN I (HIGH SENSITIVITY)
Troponin I (High Sensitivity): 4 ng/L (ref <18)
Troponin I (High Sensitivity): 5 ng/L (ref <18)

## 2019-09-28 LAB — LIPASE, BLOOD: Lipase: 22 U/L (ref 11–51)

## 2019-09-28 IMAGING — DX DG CHEST 1V PORT
1 series · 1 of 1 positions shown · non-contrast
Comparison: [DATE].

CLINICAL DATA: Loss of consciousness.

EXAM:
PORTABLE CHEST 1 VIEW

[chest]
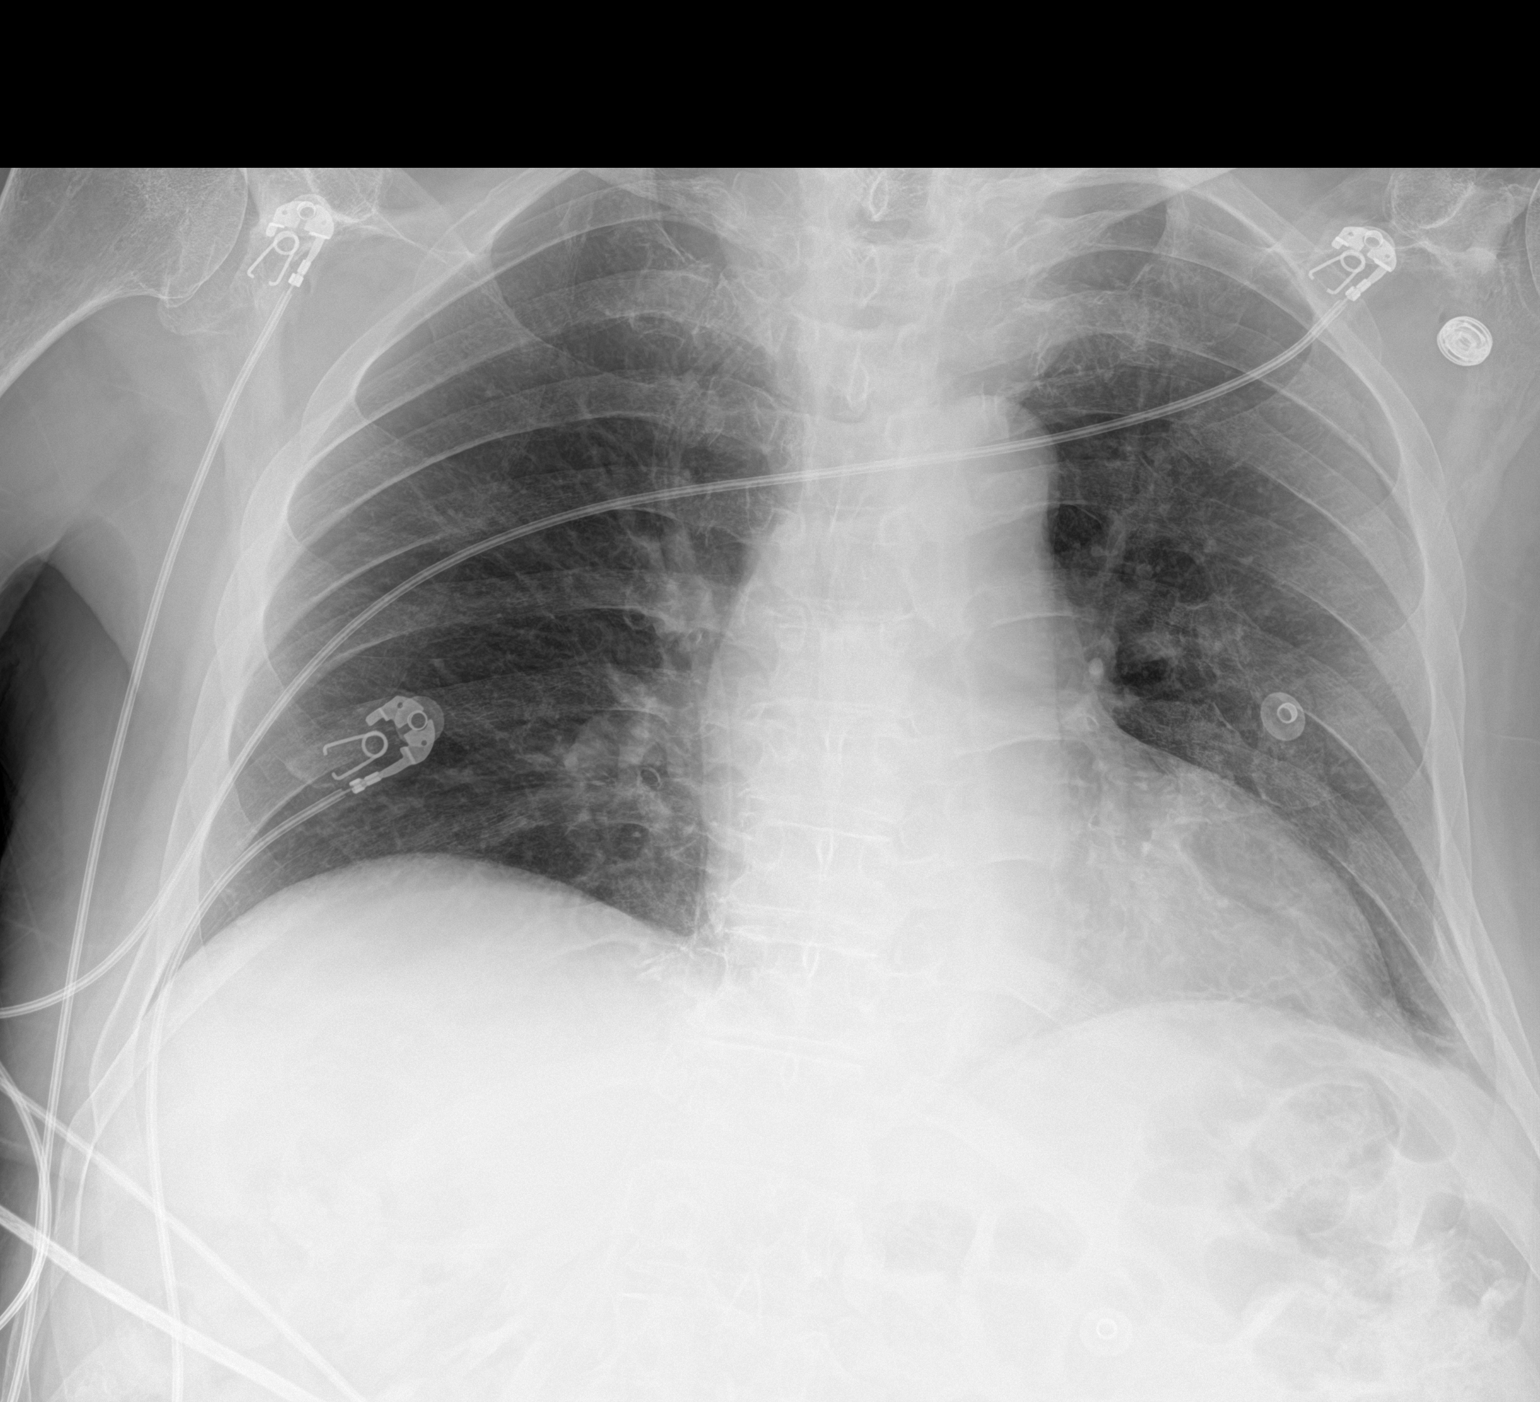

[1 of 1 positions shown; findings below may reference images not displayed]

FINDINGS: The heart size and mediastinal contours are within normal limits.
Both lungs are clear. No pneumothorax or pleural effusion is noted
the visualized skeletal structures are unremarkable.
IMPRESSION: No active disease.

## 2019-09-28 IMAGING — CT CT HEAD W/O CM
3 series · 15 of 46 positions shown, 18 images · non-contrast
Comparison: [DATE]

CLINICAL DATA: Syncope remote history stroke

EXAM:
CT HEAD WITHOUT CONTRAST
TECHNIQUE: Contiguous axial images were obtained from the base of the skull
through the vertex without intravenous contrast.

[Series 3: head 5.0 h30s · axial · 0.49mm/px · z∈[-19,+101]mm · 9 of 29 slices shown, 12 images]
[im 3/29  brain]
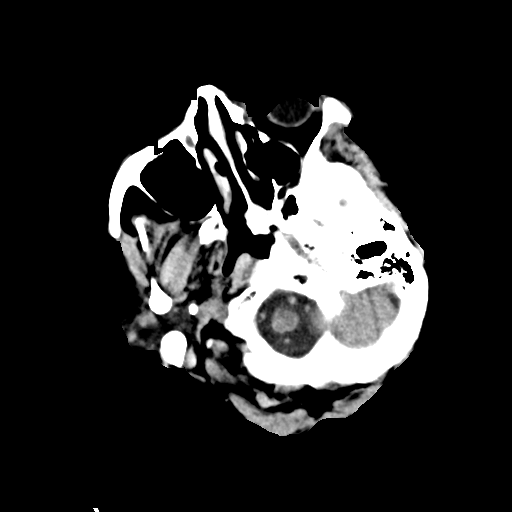
[im 3/29  bone]
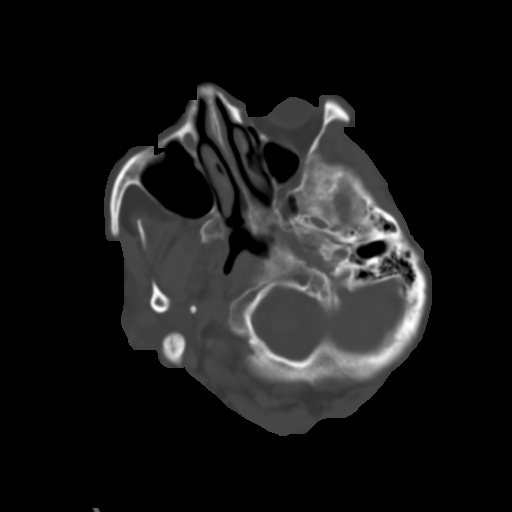
[im 6/29  brain]
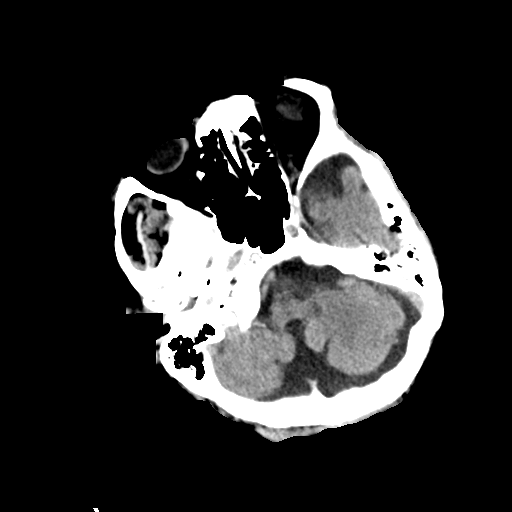
[im 9/29  brain]
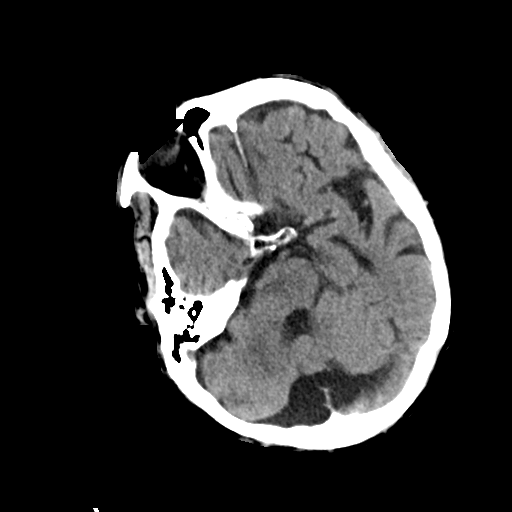
[im 12/29  brain]
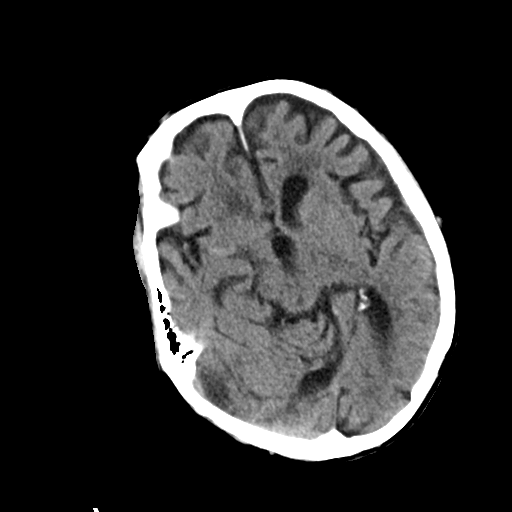
[im 15/29  brain]
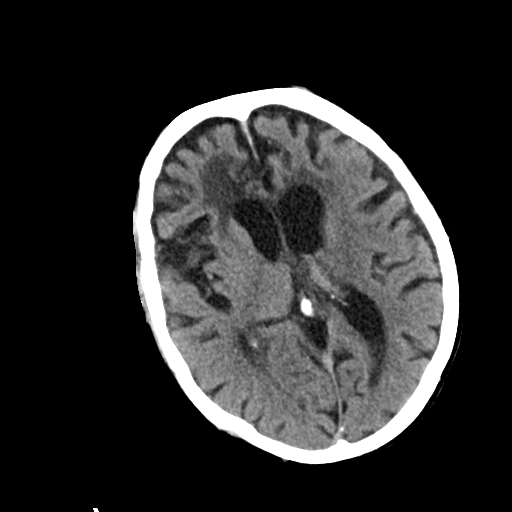
[im 15/29  bone]
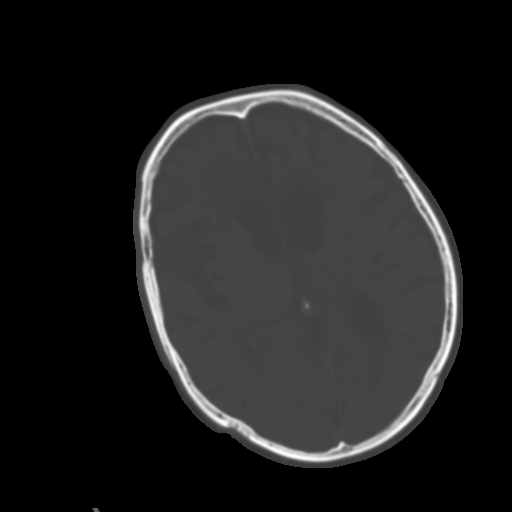
[im 18/29  brain]
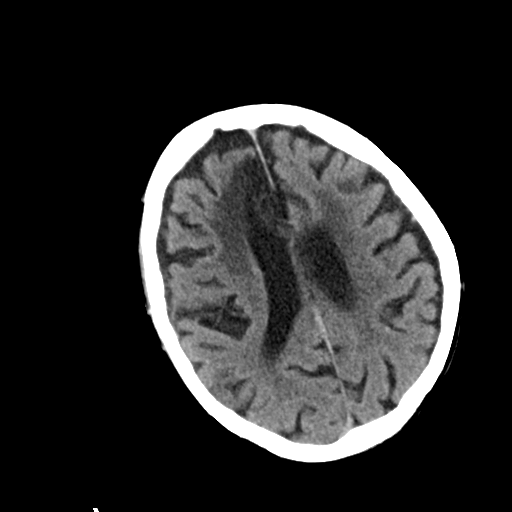
[im 21/29  brain]
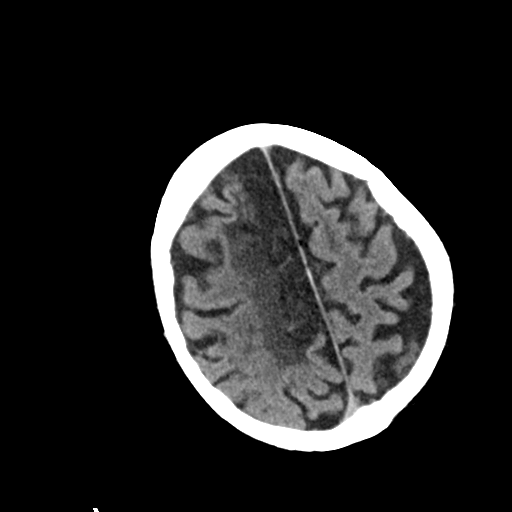
[im 24/29  brain]
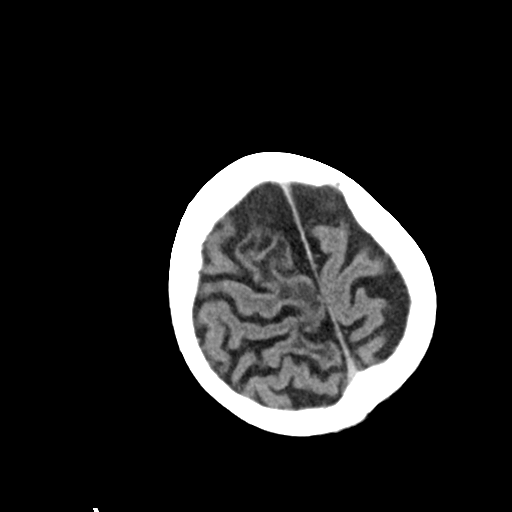
[im 27/29  brain]
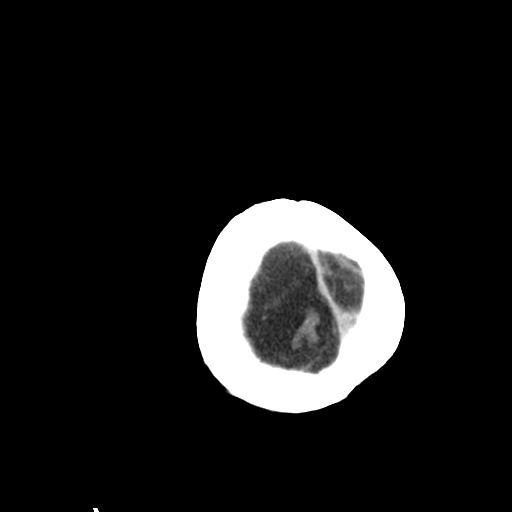
[im 27/29  bone]
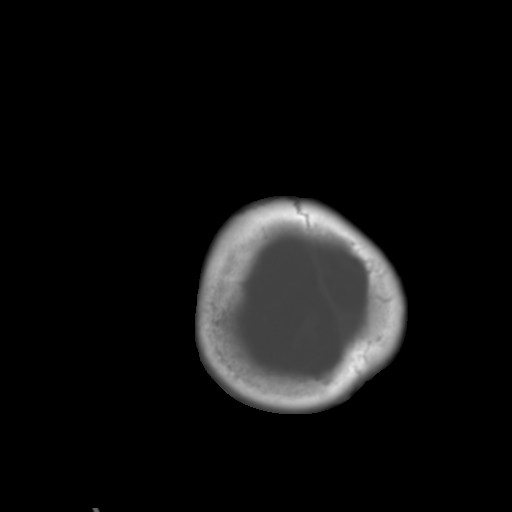

[Series 5: head 3.0 mpr cor · coronal · 0.32mm/px · 3 of 73 slices shown]
[im 25/73  brain]
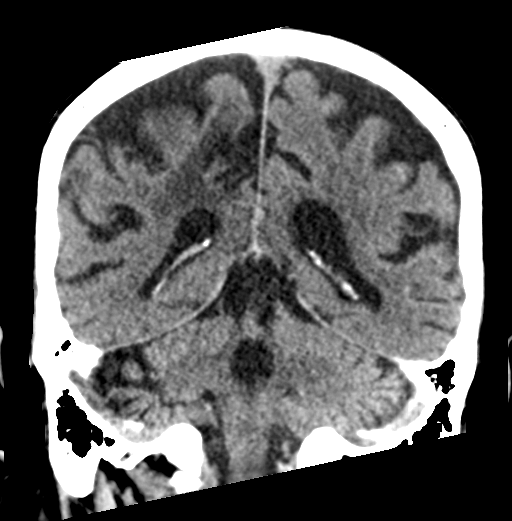
[im 33/73  brain]
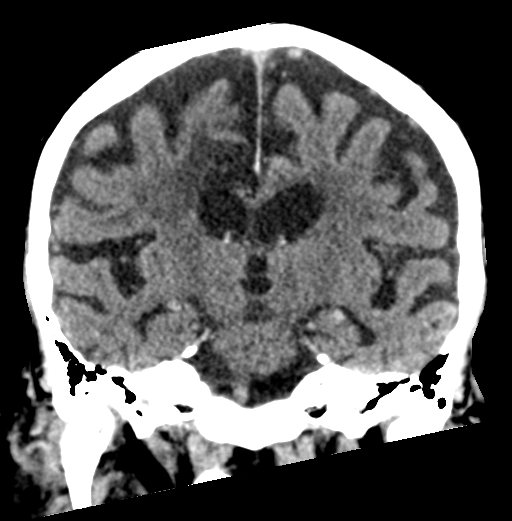
[im 41/73  brain]
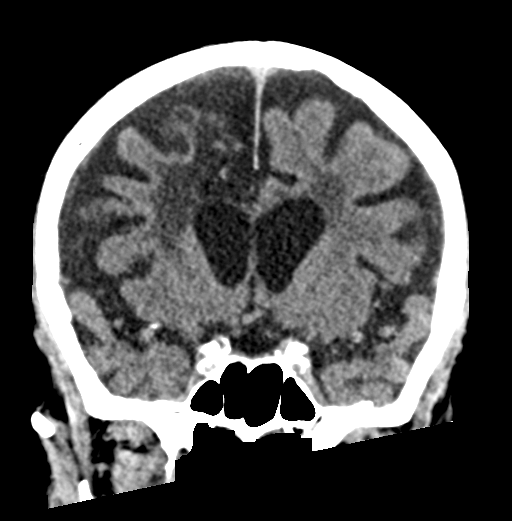

[Series 6: head 3.0 mpr sag · sagittal · 0.32mm/px · 3 of 67 slices shown]
[im 25/67  brain]
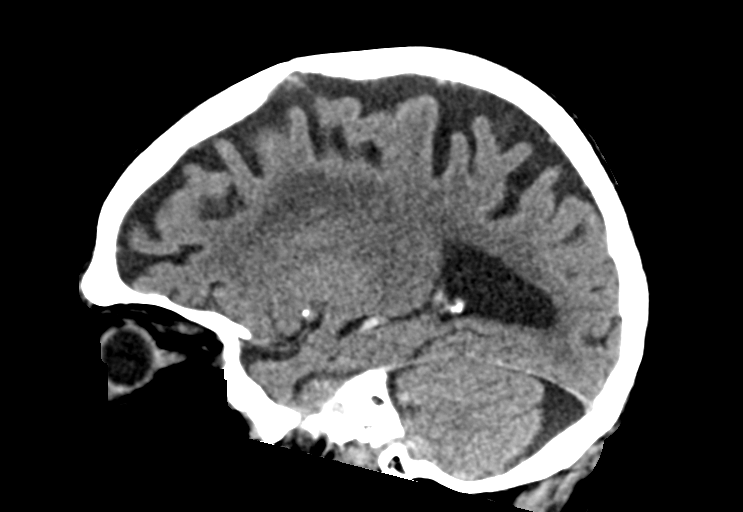
[im 34/67  brain]
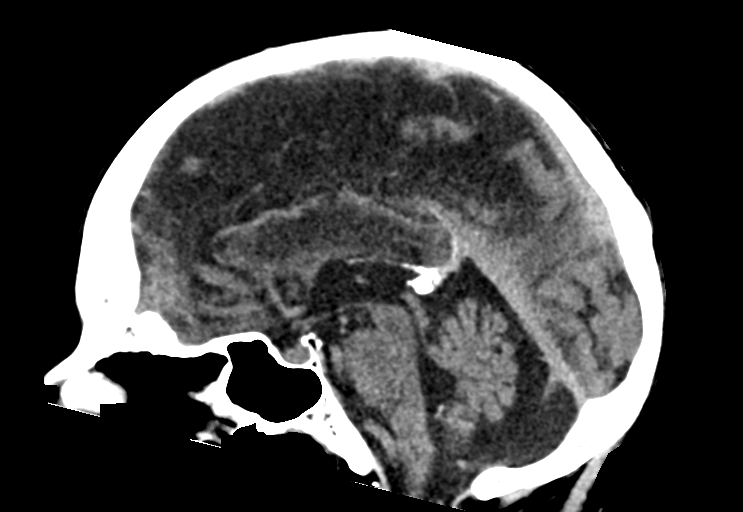
[im 42/67  brain]
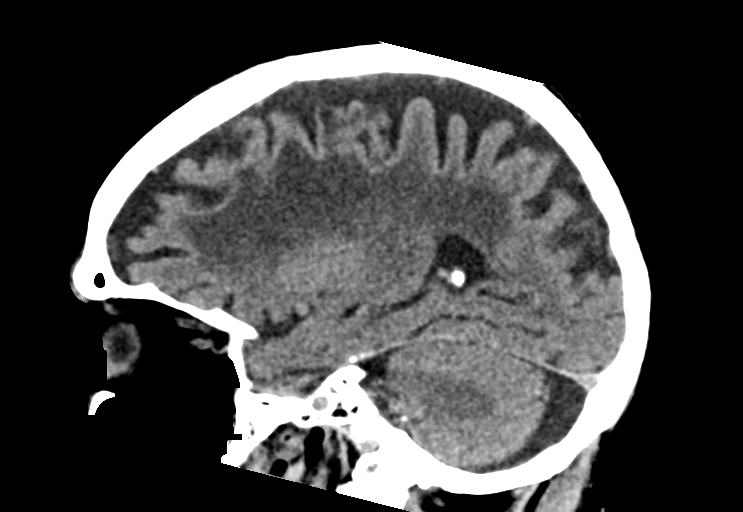

[15 of 46 positions shown; findings below may reference images not displayed]

FINDINGS: Brain: Normal anatomic configuration. Remote right ACA territory
infarct again noted. Moderate parenchymal volume loss appears stable
since prior examination chronic bilateral small subdural hygromas
appears stable since prior examination. Advanced periventricular
white matter changes are present likely reflecting the sequela of
small vessel ischemia. No evidence of acute intracranial hemorrhage
or infarct. No abnormal intra or extra-axial mass lesion. No
abnormal mass effect or midline shift. Ventricular size is normal.
Cerebellum is unremarkable.

Vascular: Advanced vascular calcifications are seen within the
carotid siphons bilaterally. No hyperdense vasculature noted at the
skull base.

Skull: Intact

Sinuses/Orbits: Paranasal sinuses are clear. Orbits are
unremarkable.

Other: Mastoid air cells and middle ear cavities are clear.
IMPRESSION: Stable senescent changes. Remote right ACA territory infarct.
Chronic small bilateral subdural hygroma are stable. No evidence of
acute intracranial hemorrhage or infarct.

## 2019-09-28 NOTE — ED Notes (Signed)
Leanne Chang sister [phone] would like an update on pt

## 2019-09-28 NOTE — ED Provider Notes (Signed)
[facility] EMERGENCY DEPARTMENT Provider Note   CSN: [id number] Arrival date & time: 09/28/19  1212     History Chief Complaint  Patient presents with  . Loss of Consciousness    Jeffery Haynes is a 86 y.o. male w/ hx of significant alzheimer's dementia presenting to the ED with near-syncopal episode.  This occurred at his [employer] home when attempting to stand up the patient, he had a reported syncopal episode.  He has baseline left sided weakness from a prior stroke.  The patient himself tells me he has pain in his left shoulder and feels lightheaded, but denies headache.  He has significant dementia and cannot recall other events.  I spoke to [place] the Associate Professor at [facility].  She tells me,  "He (the patient) was in therapy this morning and had a syncopal episode.  He slumped in his wheelchair and they laid him on the ground.  We found him with good pulses and shallow breathing.  We laid him flat and did some sternal rubs, no chest compressions, and he woke up within 3 minutes.  He was 99% on room air."  I asked about his shoulder pain and she says, "He was complaining of shoulder pain but we had picked him up to lay him flat."  HPI   Past Medical History:  Diagnosis Date  . Alzheimer disease (HCC)   . Dementia (HCC)   . Diabetes mellitus without complication [name])    Patient Active Problem List   Diagnosis Date Noted  . Alzheimer disease (HCC)   . Dementia (HCC)     History reviewed. No pertinent family history.  Social History   Tobacco Use  . Smoking status: Not on file  Substance Use Topics  . Alcohol use: Not on file  . Drug use: Not on file    Home Medications Prior to Admission medications   Not on File    Allergies    Patient has no known allergies.  Review of Systems   Review of Systems  Unable to perform ROS: Dementia (level 5 caveat)    Physical Exam Updated Vital Signs BP 122/67   Pulse 62    Temp 97.9 F (36.6 C) (Oral)   Resp 17   SpO2 100%   Physical Exam Vitals and nursing note reviewed.  Constitutional:      Appearance: He is well-developed.  HENT:     Head: Normocephalic and atraumatic.  Eyes:     Conjunctiva/sclera: Conjunctivae normal.     Pupils: Pupils are equal, round, and reactive to light.  Cardiovascular:     Rate and Rhythm: Normal rate and regular rhythm.     Pulses: Normal pulses.     Heart sounds: No murmur heard.   Pulmonary:     Effort: Pulmonary effort is normal. No respiratory distress.     Breath sounds: Normal breath sounds.  Abdominal:     General: There is no distension.     Palpations: Abdomen is soft.     Tenderness: There is no abdominal tenderness.  Musculoskeletal:     Cervical back: Neck supple.  Skin:    General: Skin is warm and dry.  Neurological:     General: No focal deficit present.     Mental Status: He is alert. Mental status is at baseline.  Psychiatric:        Mood and Affect: Mood normal.     ED Results / Procedures / Treatments  Labs (all labs ordered are listed, but only abnormal results are displayed) Labs Reviewed  COMPREHENSIVE METABOLIC PANEL - Abnormal; Notable for the following components:      Result Value   Glucose, Bld 152 (*)    Total Protein 5.8 (*)    All other components within normal limits  CBC WITH DIFFERENTIAL/PLATELET  LIPASE, BLOOD  URINALYSIS, ROUTINE W REFLEX MICROSCOPIC  TROPONIN I (HIGH SENSITIVITY)  TROPONIN I (HIGH SENSITIVITY)    EKG EKG Interpretation  Date/Time:  Friday September 28 2019 13:50:22 EDT Ventricular Rate:  57 PR Interval:    QRS Duration: 101 QT Interval:  417 QTC Calculation: 406 R Axis:   14 Text Interpretation: Sinus rhythm Low voltage, precordial leads No STEMI Confirmed by Alvester Chou [phone]) on 09/28/2019 1:54:50 PM   Radiology CT Head Wo Contrast  Result Date: 09/28/2019 CLINICAL DATA:  Syncope remote history stroke EXAM: CT HEAD WITHOUT  CONTRAST TECHNIQUE: Contiguous axial images were obtained from the base of the skull through the vertex without intravenous contrast. COMPARISON:  01/19/2019 FINDINGS: Brain: Normal anatomic configuration. Remote right ACA territory infarct again noted. Moderate parenchymal volume loss appears stable since prior examination chronic bilateral small subdural hygromas appears stable since prior examination. Advanced periventricular white matter changes are present likely reflecting the sequela of small vessel ischemia. No evidence of acute intracranial hemorrhage or infarct. No abnormal intra or extra-axial mass lesion. No abnormal mass effect or midline shift. Ventricular size is normal. Cerebellum is unremarkable. Vascular: Advanced vascular calcifications are seen within the carotid siphons bilaterally. No hyperdense vasculature noted at the skull base. Skull: Intact Sinuses/Orbits: Paranasal sinuses are clear. Orbits are unremarkable. Other: Mastoid air cells and middle ear cavities are clear. IMPRESSION: Stable senescent changes. 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 No evidence of acute intracranial hemorrhage or infarct. Electronically Signed   By: Helyn Numbers MD   On: 09/28/2019 15:34   DG Chest Portable 1 View  Result Date: 09/28/2019 CLINICAL DATA:  Loss of consciousness. EXAM: PORTABLE CHEST 1 VIEW COMPARISON:  January 19, 2019. FINDINGS: The heart size and mediastinal contours are within normal limits. Both lungs are clear. No pneumothorax or pleural effusion is noted the visualized skeletal structures are unremarkable. IMPRESSION: No active disease. Electronically Signed   By: Lupita Raider M.D.   On: 09/28/2019 13:59    Procedures Procedures (including critical care time)  Medications Ordered in ED Medications - No data to display  ED Course  I have reviewed the triage vital signs and the nursing notes.  Pertinent labs & imaging  results that were available during my care of the patient were reviewed by me and considered in my medical decision making (see chart for details).  86 yo male presenting from nursing care facility with syncope vs near-syncopal episode earlier today.  Patient has significant dementia and cannot provide reliable history.  He reports some shoulder discomfort (this may be where the staff grabbed him during the episode) but has no deformity and full ROM, not suggestive of dislocation or collar bone fracture.  His orthostatic vital signs are unremarkable  Workup in the ED included ECG and troponin for cardiac evaluation, was was reassuring, and di not show me evidence of acute arrhythmia or ACS. Hgb today was 13.3, wnl.  WBC was 7.8.  Afebrile.  Doubtful of sepsis or infection.  DG chest per my interpretation showed no focal consolidation.  He had no pulsatile abdominal mass to suggest AAA.  Vitals remained stable in the ED, normotensive, HR 60's. CMP shows no significant dehydration, Cr normal at 0.74.  CTH obtained here with old RCA infarct and senescent changes, no acute infarcts.  Signed out at 4 pm to Dr Silverio Lay EDP with plan to f/u 2nd trop and discharge back to facility if this has no significant change.  Clinical Course as of Sep 27 1725  Fri Sep 28, 2019  1624 I updated the patient's wife Gurvir Schrom by phone.  Plan to discharge patient back to facility if 2nd trop is flat.  She verbalized understanding.   [MT]    Clinical Course User Index [MT] Caretha Rumbaugh, Kermit Balo, MD    Final Clinical Impression(s) / ED Diagnoses Final diagnoses:  Near syncope    Rx / DC Orders ED Discharge Orders    None       Renaye Rakers Kermit Balo, MD 09/28/19 1727

## 2019-09-28 NOTE — ED Notes (Signed)
Pt transported to CT ?

## 2019-09-28 NOTE — ED Notes (Signed)
All discharge instructions reviewed with pt and [facility]. Previous nurse called report to nursing facility. Pt cleaned up and discharged without issue to [facility] for transport

## 2019-09-28 NOTE — Discharge Instructions (Signed)
Stay hydrated   Continue current meds   See your doctor   Return to ER if you pass out, chest pain, vomiting.

## 2019-09-28 NOTE — ED Notes (Signed)
Jeffery Haynes wife [phone] would like an update on the pt

## 2019-09-28 NOTE — ED Provider Notes (Signed)
  Physical Exam  BP 122/67   Pulse 62   Temp 97.9 F (36.6 C) (Oral)   Resp 17   SpO2 100%   Physical Exam  ED Course/Procedures   Clinical Course as of Sep 28 1707  Fri Sep 28, 2019  1624 I updated the patient's wife Harald Quevedo by phone.  Plan to discharge patient back to facility if 2nd trop is flat.  She verbalized understanding.   [MT]    Clinical Course User Index [MT] Trifan, Kermit Balo, MD    Procedures  MDM  Patient here with near syncope.  Facility noted that he bent over but there was a pulse at that time.  And he woke up right away.  Never lost pulses.  Signout pending second troponin.  5:10 PM Second troponin negative.  Stable for discharge back to facility.    Charlynne Pander, MD 09/28/19 1710

## 2019-09-28 NOTE — ED Triage Notes (Signed)
Pt BIB [facility] from [facility]. Per facility, pt was participating in physical therapy, they attempted to stand pt up and upon standing pt had a syncopal episode. Pt fully alert now. Pt with history of dementia, also left sided deficits are baseline from previous stroke. VSS. NAD.

## 2019-09-28 NOTE — ED Notes (Signed)
[facility] called to tranport pt °

## 2020-01-29 ENCOUNTER — Encounter (HOSPITAL_COMMUNITY): Payer: Self-pay | Admitting: Internal Medicine

## 2020-01-29 ENCOUNTER — Emergency Department (HOSPITAL_COMMUNITY): Payer: Medicare Other

## 2020-01-29 ENCOUNTER — Inpatient Hospital Stay (HOSPITAL_COMMUNITY)
Admission: EM | Admit: 2020-01-29 | Discharge: 2020-02-01 | DRG: 092 | Disposition: A | Discharge status: Skilled Nursing Facility | Payer: Medicare Other | Source: Skilled Nursing Facility | Attending: Internal Medicine | Admitting: Internal Medicine

## 2020-01-29 ENCOUNTER — Inpatient Hospital Stay (HOSPITAL_COMMUNITY): Payer: Medicare Other

## 2020-01-29 DIAGNOSIS — R410 Disorientation, unspecified: Secondary | ICD-10-CM | POA: Diagnosis not present

## 2020-01-29 DIAGNOSIS — I10 Essential (primary) hypertension: Secondary | ICD-10-CM | POA: Diagnosis present

## 2020-01-29 DIAGNOSIS — R7989 Other specified abnormal findings of blood chemistry: Secondary | ICD-10-CM | POA: Diagnosis present

## 2020-01-29 DIAGNOSIS — I480 Paroxysmal atrial fibrillation: Secondary | ICD-10-CM | POA: Diagnosis present

## 2020-01-29 DIAGNOSIS — E872 Acidosis: Secondary | ICD-10-CM | POA: Diagnosis present

## 2020-01-29 DIAGNOSIS — R81 Glycosuria: Secondary | ICD-10-CM | POA: Diagnosis present

## 2020-01-29 DIAGNOSIS — Z23 Encounter for immunization: Secondary | ICD-10-CM | POA: Diagnosis present

## 2020-01-29 DIAGNOSIS — R001 Bradycardia, unspecified: Secondary | ICD-10-CM | POA: Diagnosis present

## 2020-01-29 DIAGNOSIS — Z20822 Contact with and (suspected) exposure to covid-19: Secondary | ICD-10-CM | POA: Diagnosis present

## 2020-01-29 DIAGNOSIS — T43595A Adverse effect of other antipsychotics and neuroleptics, initial encounter: Secondary | ICD-10-CM | POA: Diagnosis present

## 2020-01-29 DIAGNOSIS — E871 Hypo-osmolality and hyponatremia: Secondary | ICD-10-CM | POA: Diagnosis present

## 2020-01-29 DIAGNOSIS — E869 Volume depletion, unspecified: Secondary | ICD-10-CM | POA: Diagnosis present

## 2020-01-29 DIAGNOSIS — Z7401 Bed confinement status: Secondary | ICD-10-CM | POA: Diagnosis not present

## 2020-01-29 DIAGNOSIS — E119 Type 2 diabetes mellitus without complications: Secondary | ICD-10-CM | POA: Diagnosis present

## 2020-01-29 DIAGNOSIS — G9341 Metabolic encephalopathy: Secondary | ICD-10-CM | POA: Diagnosis present

## 2020-01-29 DIAGNOSIS — E876 Hypokalemia: Secondary | ICD-10-CM | POA: Diagnosis not present

## 2020-01-29 DIAGNOSIS — Z8673 Personal history of transient ischemic attack (TIA), and cerebral infarction without residual deficits: Secondary | ICD-10-CM

## 2020-01-29 DIAGNOSIS — F0280 Dementia in other diseases classified elsewhere without behavioral disturbance: Secondary | ICD-10-CM | POA: Diagnosis present

## 2020-01-29 DIAGNOSIS — Z7984 Long term (current) use of oral hypoglycemic drugs: Secondary | ICD-10-CM

## 2020-01-29 DIAGNOSIS — Z7902 Long term (current) use of antithrombotics/antiplatelets: Secondary | ICD-10-CM | POA: Diagnosis not present

## 2020-01-29 DIAGNOSIS — I4891 Unspecified atrial fibrillation: Secondary | ICD-10-CM | POA: Diagnosis not present

## 2020-01-29 DIAGNOSIS — G309 Alzheimer's disease, unspecified: Secondary | ICD-10-CM | POA: Diagnosis present

## 2020-01-29 DIAGNOSIS — Z79899 Other long term (current) drug therapy: Secondary | ICD-10-CM | POA: Diagnosis not present

## 2020-01-29 DIAGNOSIS — J9601 Acute respiratory failure with hypoxia: Secondary | ICD-10-CM

## 2020-01-29 DIAGNOSIS — G210 Malignant neuroleptic syndrome: Principal | ICD-10-CM | POA: Diagnosis present

## 2020-01-29 DIAGNOSIS — A419 Sepsis, unspecified organism: Secondary | ICD-10-CM

## 2020-01-29 LAB — CBC WITH DIFFERENTIAL/PLATELET
Abs Immature Granulocytes: 0.1 10*3/uL — ABNORMAL HIGH (ref 0.00–0.07)
Basophils Absolute: 0.1 10*3/uL (ref 0.0–0.1)
Basophils Relative: 0 %
Eosinophils Absolute: 0 10*3/uL (ref 0.0–0.5)
Eosinophils Relative: 0 %
HCT: 47 % (ref 39.0–52.0)
Hemoglobin: 14.8 g/dL (ref 13.0–17.0)
Immature Granulocytes: 1 %
Lymphocytes Relative: 8 %
Lymphs Abs: 1.1 10*3/uL (ref 0.7–4.0)
MCH: 29 pg (ref 26.0–34.0)
MCHC: 31.5 g/dL (ref 30.0–36.0)
MCV: 92 fL (ref 80.0–100.0)
Monocytes Absolute: 0.7 10*3/uL (ref 0.1–1.0)
Monocytes Relative: 5 %
Neutro Abs: 11.8 10*3/uL — ABNORMAL HIGH (ref 1.7–7.7)
Neutrophils Relative %: 86 %
Platelets: 247 10*3/uL (ref 150–400)
RBC: 5.11 MIL/uL (ref 4.22–5.81)
RDW: 12.7 % (ref 11.5–15.5)
WBC: 13.7 10*3/uL — ABNORMAL HIGH (ref 4.0–10.5)
nRBC: 0 % (ref 0.0–0.2)

## 2020-01-29 LAB — CK: Total CK: 28 U/L — ABNORMAL LOW (ref 49–397)

## 2020-01-29 LAB — URINALYSIS, ROUTINE W REFLEX MICROSCOPIC
Bacteria, UA: NONE SEEN
Bilirubin Urine: NEGATIVE
Glucose, UA: >=500 mg/dL — AB
Hgb urine dipstick: NEGATIVE
Ketones, ur: 5 mg/dL — AB
Leukocytes,Ua: NEGATIVE
Nitrite: NEGATIVE
Protein, ur: NEGATIVE mg/dL
Specific Gravity, Urine: 1.012 (ref 1.005–1.030)
pH: 7 (ref 5.0–8.0)

## 2020-01-29 LAB — HEMOGLOBIN A1C
Hgb A1c MFr Bld: 7.2 % — ABNORMAL HIGH (ref 4.8–5.6)
Mean Plasma Glucose: 159.94 mg/dL

## 2020-01-29 LAB — COMPREHENSIVE METABOLIC PANEL
ALT: 10 U/L (ref 0–44)
AST: 27 U/L (ref 15–41)
Albumin: 3.6 g/dL (ref 3.5–5.0)
Alkaline Phosphatase: 74 U/L (ref 38–126)
Anion gap: 12 (ref 5–15)
BUN: 10 mg/dL (ref 8–23)
CO2: 24 mmol/L (ref 22–32)
Calcium: 9.1 mg/dL (ref 8.9–10.3)
Chloride: 101 mmol/L (ref 98–111)
Creatinine, Ser: 0.64 mg/dL (ref 0.61–1.24)
GFR, Estimated: >60 mL/min (ref >60)
Glucose, Bld: 201 mg/dL — ABNORMAL HIGH (ref 70–99)
Potassium: 5.2 mmol/L — ABNORMAL HIGH (ref 3.5–5.1)
Sodium: 137 mmol/L (ref 135–145)
Total Bilirubin: 0.8 mg/dL (ref 0.3–1.2)
Total Protein: 6.3 g/dL — ABNORMAL LOW (ref 6.5–8.1)

## 2020-01-29 LAB — VALPROIC ACID LEVEL: Valproic Acid Lvl: 20 ug/mL — ABNORMAL LOW (ref 50.0–100.0)

## 2020-01-29 LAB — PROTIME-INR
INR: 1 (ref 0.8–1.2)
Prothrombin Time: 13.2 seconds (ref 11.4–15.2)

## 2020-01-29 LAB — LACTIC ACID, PLASMA
Lactic Acid, Venous: 3.9 mmol/L (ref 0.5–1.9)
Lactic Acid, Venous: 2.3 mmol/L (ref 0.5–1.9)

## 2020-01-29 LAB — AMMONIA: Ammonia: 32 umol/L (ref 9–35)

## 2020-01-29 LAB — PROCALCITONIN: Procalcitonin: <0.1 ng/mL

## 2020-01-29 LAB — CBG MONITORING, ED
Glucose-Capillary: 167 mg/dL — ABNORMAL HIGH (ref 70–99)
Glucose-Capillary: 190 mg/dL — ABNORMAL HIGH (ref 70–99)

## 2020-01-29 LAB — APTT: aPTT: 31 seconds (ref 24–36)

## 2020-01-29 IMAGING — DX DG CHEST 1V PORT
1 series · 1 of 1 positions shown · non-contrast
Comparison: Earlier film, same date.

CLINICAL DATA: Acute hypoxic respiratory failure.

EXAM:
PORTABLE CHEST 1 VIEW

[chest]
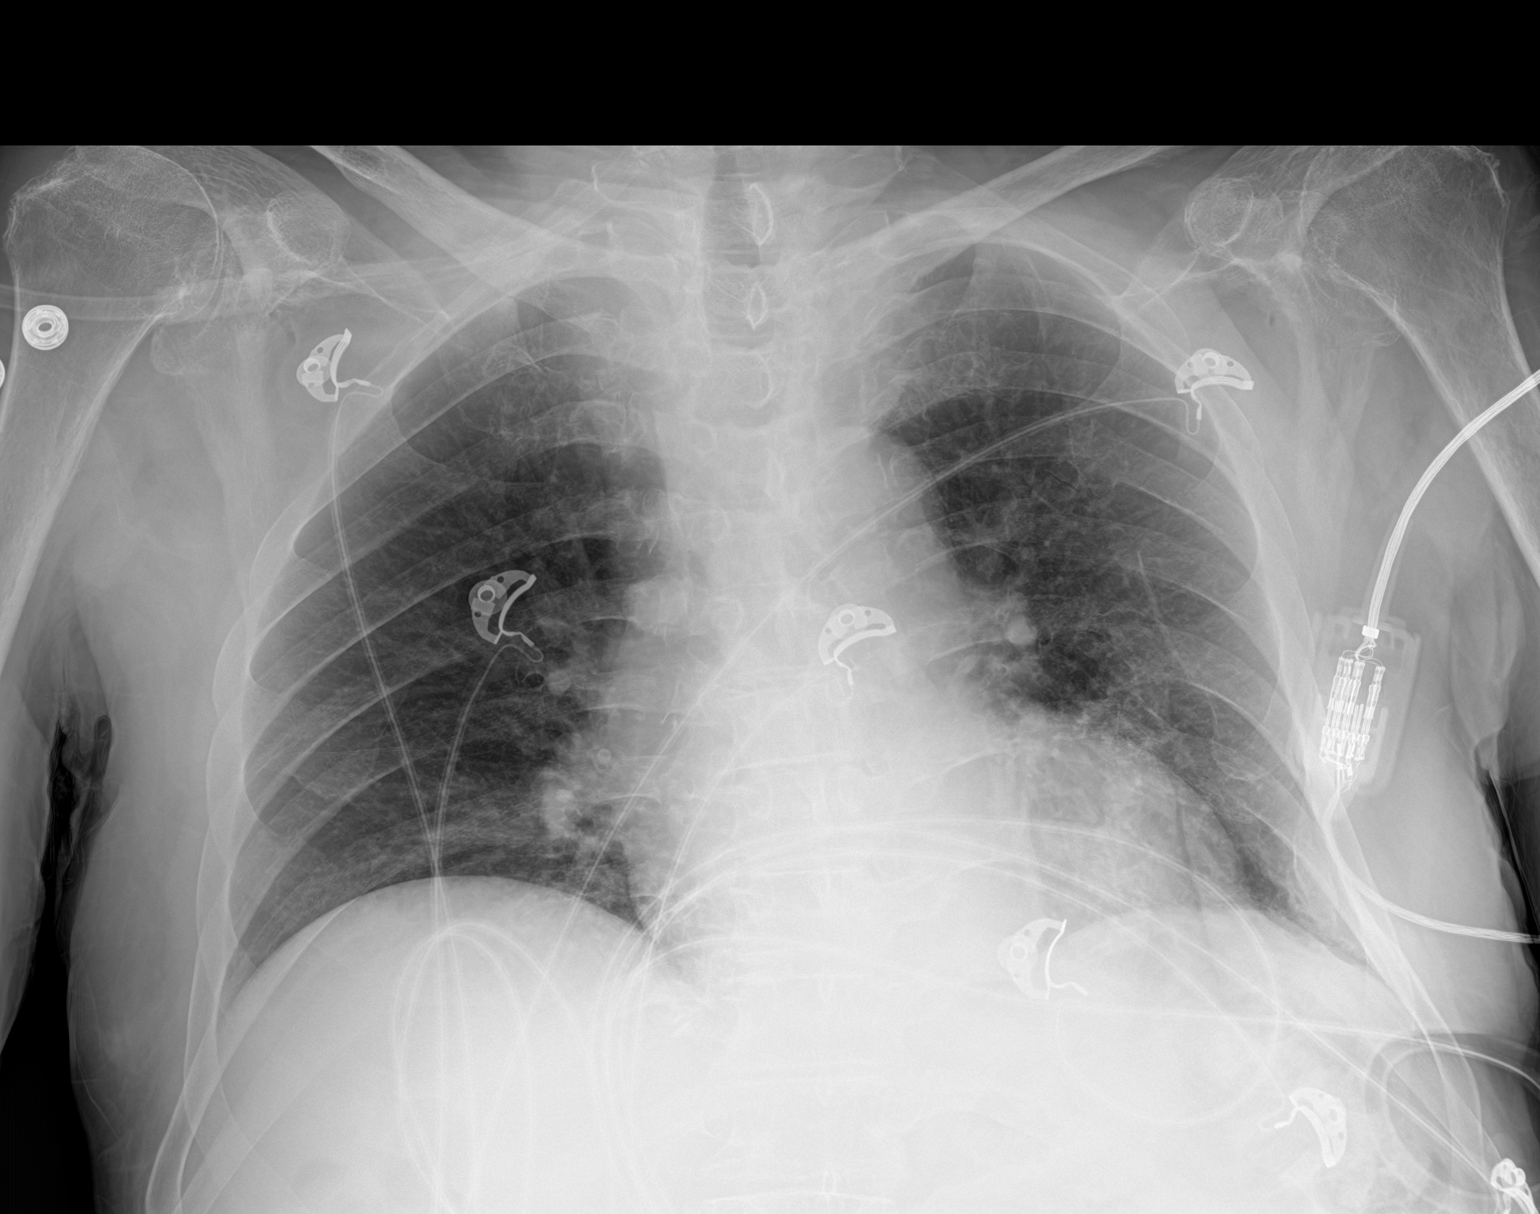

[1 of 1 positions shown; findings below may reference images not displayed]

FINDINGS: The cardiac silhouette, mediastinal and hilar contours are stable.

Low lung volumes with vascular crowding and streaky basilar
atelectasis. No definite infiltrates or effusions. No pneumothorax.
IMPRESSION: Low lung volumes with vascular crowding and streaky basilar
atelectasis.

## 2020-01-29 IMAGING — DX DG CHEST 1V PORT
1 series · 1 of 1 positions shown · non-contrast
Comparison: Portable chest [DATE] and earlier.

CLINICAL DATA: 88-year-old male with possible sepsis. Possible
seizure activity at [QM] hours.

EXAM:
PORTABLE CHEST 1 VIEW

[chest ap]
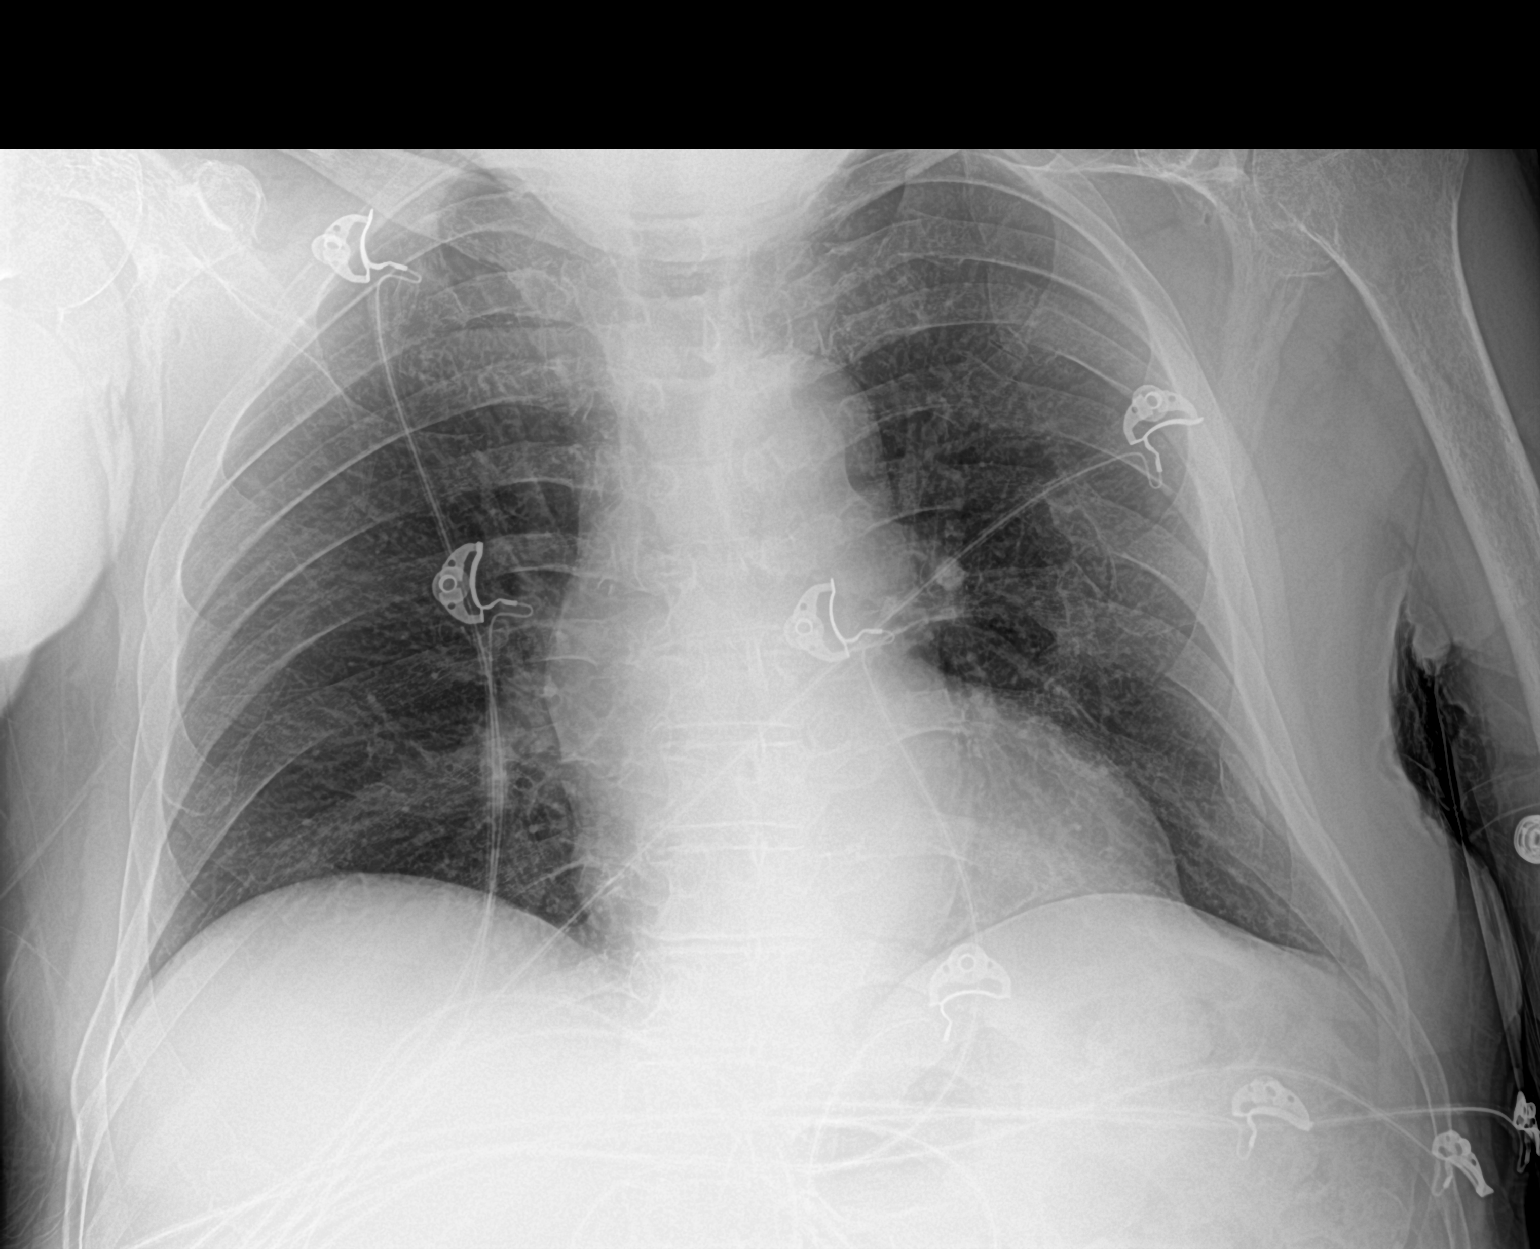

[1 of 1 positions shown; findings below may reference images not displayed]

FINDINGS: Portable AP semi upright view at [QM] hours. Improved lung volumes.
Mediastinal contours are stable with mild tortuosity of the aorta.
Visualized tracheal air column is within normal limits. Allowing for
portable technique the lungs are clear. Negative visible bowel gas
pattern. No acute osseous abnormality identified.
IMPRESSION: No acute cardiopulmonary abnormality.

## 2020-01-29 IMAGING — CT CT HEAD W/O CM
4 series · 15 of 47 positions shown, 17 images · non-contrast
Comparison: Head CT [DATE].

CLINICAL DATA: Delirium. Additional history provided: Altered
mental status, possible seizure.

EXAM:
CT HEAD WITHOUT CONTRAST
TECHNIQUE: Contiguous axial images were obtained from the base of the skull
through the vertex without intravenous contrast.

[Series 3: head without · axial · non-contrast · 0.47mm/px · z∈[+1308,+1428]mm · 7 of 33 slices shown, 9 images]
[im 5/33  brain]
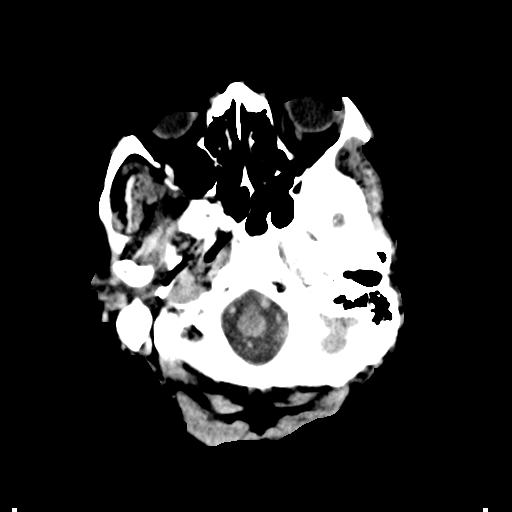
[im 5/33  bone]
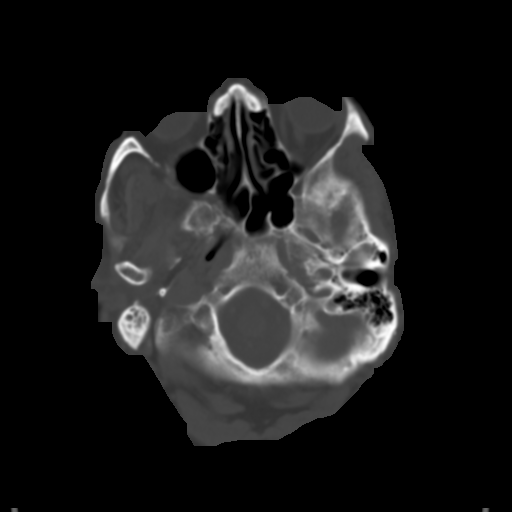
[im 9/33  brain]
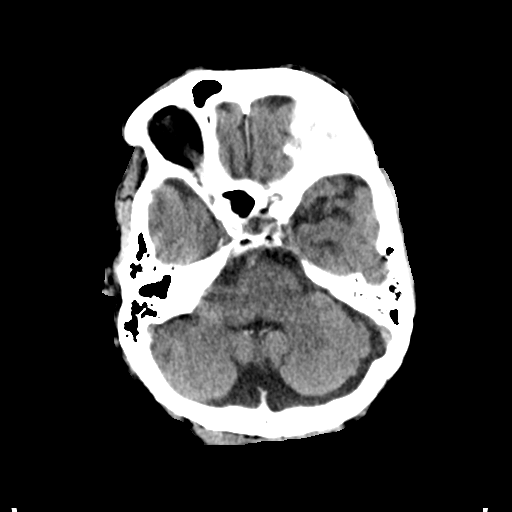
[im 13/33  brain]
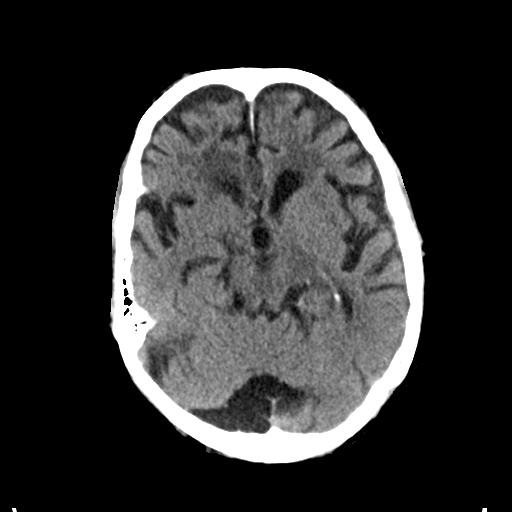
[im 17/33  brain]
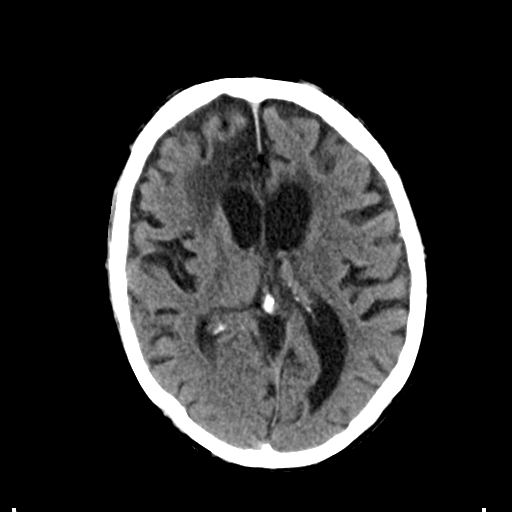
[im 21/33  brain]
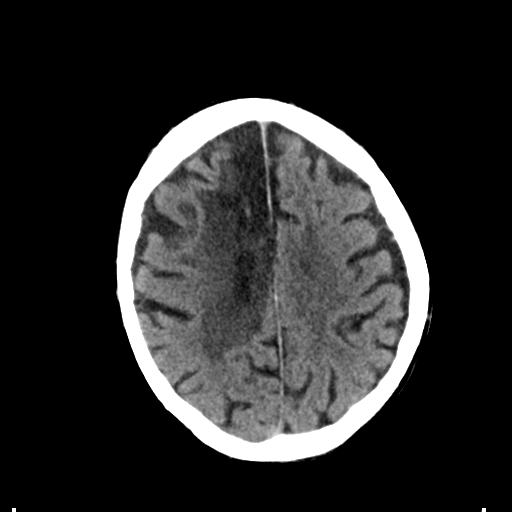
[im 21/33  bone]
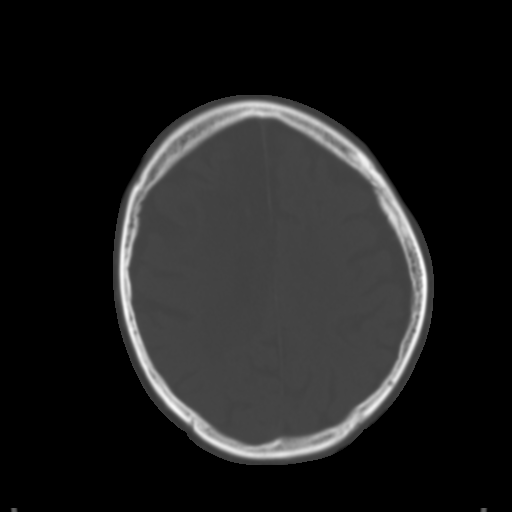
[im 25/33  brain]
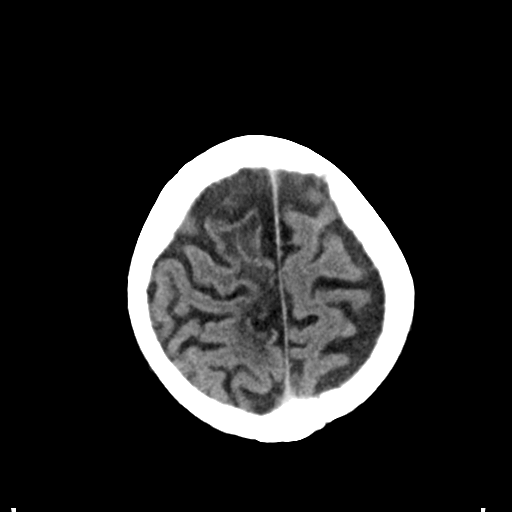
[im 29/33  brain]
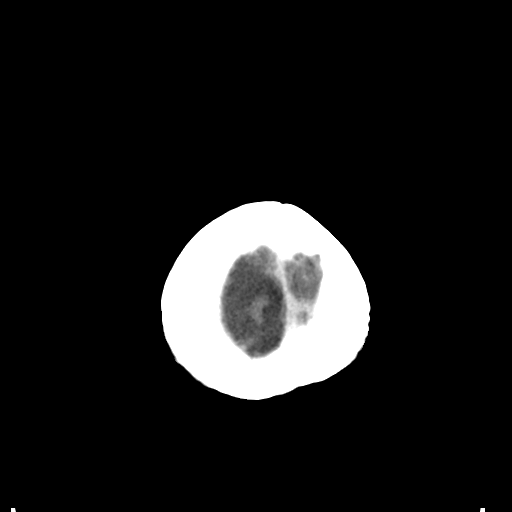

[Series 4: head bone · axial · 0.47mm/px · z∈[+1304,+1320]mm · 2 of 82 slices shown]
[im 9/82  bone]
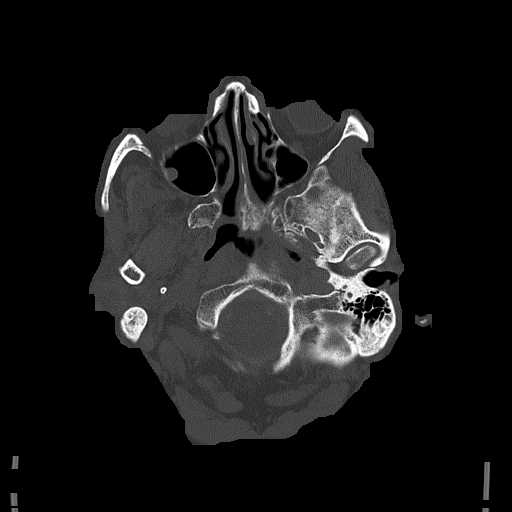
[im 17/82  bone]
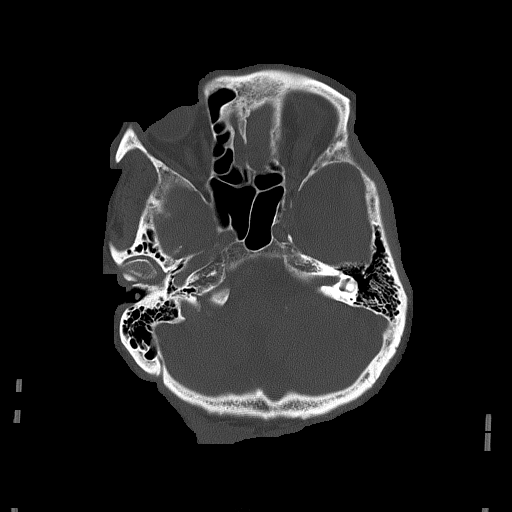

[Series 5: head without cor · coronal · non-contrast · 0.32mm/px · 3 of 70 slices shown]
[im 24/70  brain]
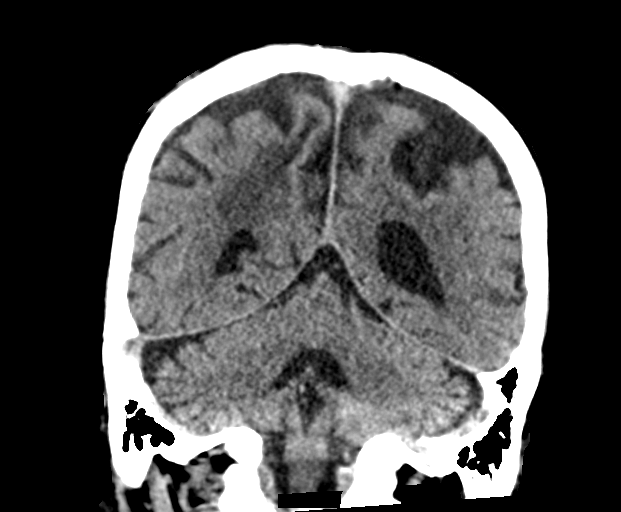
[im 31/70  brain]
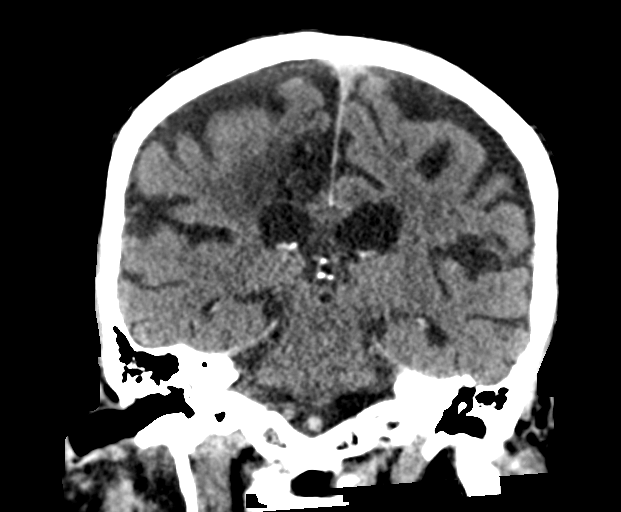
[im 39/70  brain]
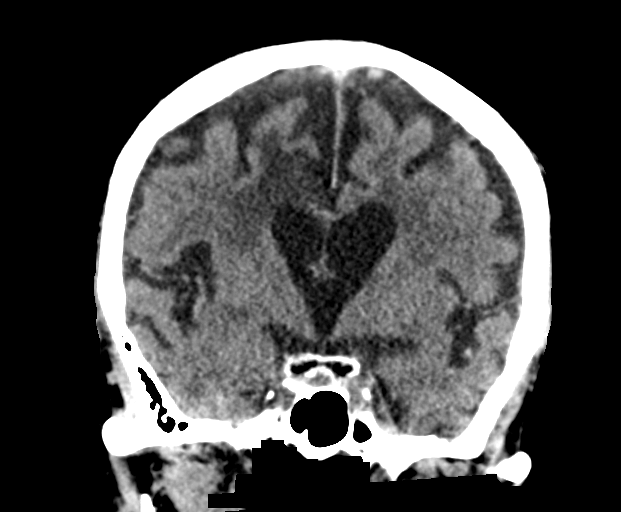

[Series 6: head without sag · sagittal · non-contrast · 0.33mm/px · 3 of 66 slices shown]
[im 22/66  brain]
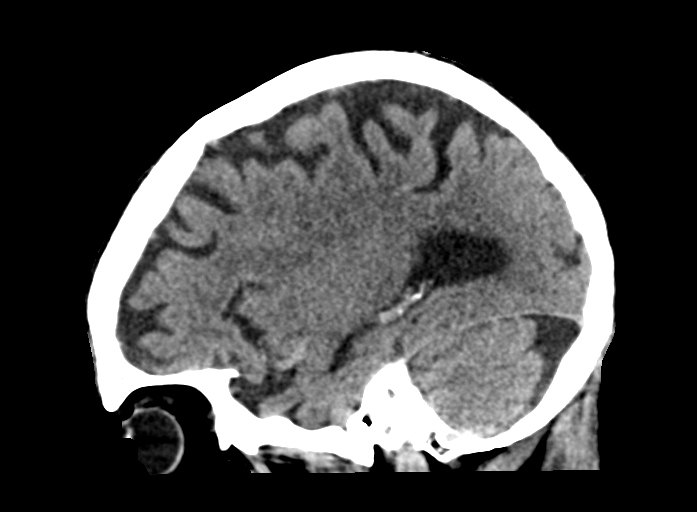
[im 33/66  brain]
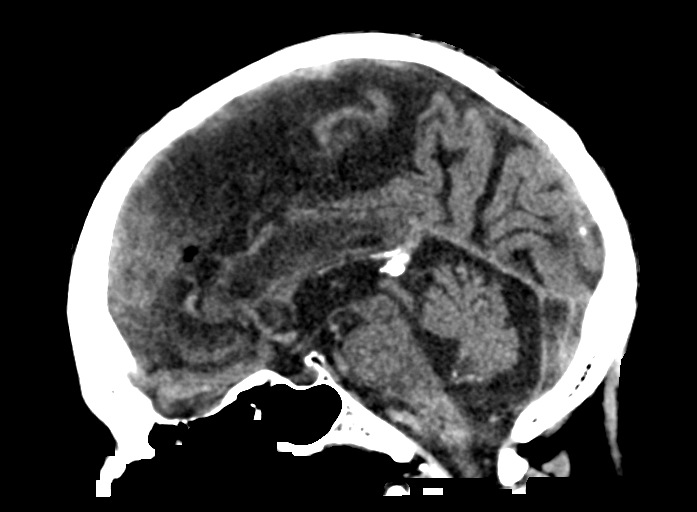
[im 44/66  brain]
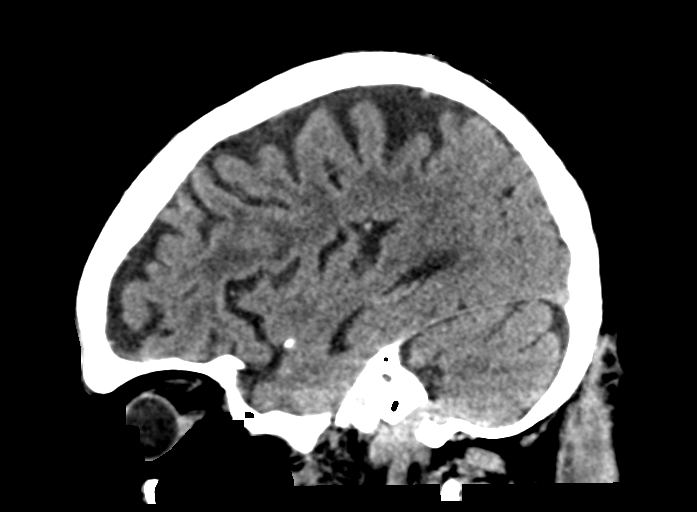

[15 of 47 positions shown; findings below may reference images not displayed]

FINDINGS: Brain:

Moderate generalized cerebral atrophy.

Redemonstrated large chronic right ACA territory infarct.

Background mild ill-defined hypoattenuation within the cerebral
white matter is nonspecific, but compatible with chronic small
vessel ischemic disease.

There is no acute intracranial hemorrhage.

No acute demarcated cortical infarct is identified.

No extra-axial fluid collection.

No evidence of intracranial mass.

No midline shift.

Vascular: No hyperdense vessel.  Atherosclerotic calcifications.

Skull: Normal. Negative for fracture or focal lesion.

Sinuses/Orbits: Visualized orbits show no acute finding. Mild
ethmoid sinus mucosal thickening. Small right maxillary sinus mucous
retention cyst. Trace fluid within the bilateral mastoid air cells.
IMPRESSION: No evidence of acute intracranial abnormality.

Redemonstrated large chronic right ACA territory infarct.

Stable background moderate cerebral atrophy and mild chronic small
vessel ischemic disease.

Mild paranasal sinus disease as described.

Trace bilateral mastoid effusions.

## 2020-01-29 MED ORDER — DILTIAZEM HCL 25 MG/5ML IV SOLN: 5.0000 mg | Freq: Once | INTRAVENOUS | Status: DC

## 2020-01-29 MED ORDER — SODIUM CHLORIDE 0.9 % IV BOLUS: 1000.0000 mL | Freq: Once | INTRAVENOUS | Status: DC

## 2020-01-29 MED ORDER — PIPERACILLIN-TAZOBACTAM 3.375 G IVPB 30 MIN
3.3750 g | Freq: Once | INTRAVENOUS | Status: AC
  Administered 2020-01-29: 3.375 g via INTRAVENOUS
  Filled 2020-01-29: qty 50

## 2020-01-29 MED ORDER — LACTATED RINGERS IV BOLUS (SEPSIS)
250.0000 mL | Freq: Once | INTRAVENOUS | Status: AC
  Administered 2020-01-29: 250 mL via INTRAVENOUS

## 2020-01-29 MED ORDER — HYDRALAZINE HCL 20 MG/ML IJ SOLN: 10.0000 mg | INTRAMUSCULAR | Status: DC | PRN

## 2020-01-29 MED ORDER — VANCOMYCIN HCL 1750 MG/350ML IV SOLN
1750.0000 mg | Freq: Once | INTRAVENOUS | Status: AC
  Administered 2020-01-29: 1750 mg via INTRAVENOUS
  Filled 2020-01-29: qty 350

## 2020-01-29 MED ORDER — LACTATED RINGERS IV SOLN: INTRAVENOUS | Status: DC

## 2020-01-29 MED ORDER — LACTATED RINGERS IV BOLUS (SEPSIS)
1000.0000 mL | Freq: Once | INTRAVENOUS | Status: AC
  Administered 2020-01-29: 1000 mL via INTRAVENOUS

## 2020-01-29 MED ORDER — PIPERACILLIN-TAZOBACTAM 3.375 G IVPB
3.3750 g | Freq: Three times a day (TID) | INTRAVENOUS | Status: DC
  Administered 2020-01-29 – 2020-01-31 (×6): 3.375 g via INTRAVENOUS
  Filled 2020-01-29 (×5): qty 50

## 2020-01-29 MED ORDER — DILTIAZEM HCL-DEXTROSE 125-5 MG/125ML-% IV SOLN (PREMIX)
5.0000 mg/h | INTRAVENOUS | Status: DC
  Administered 2020-01-29 – 2020-01-30 (×2): 5 mg/h via INTRAVENOUS
  Filled 2020-01-29 (×2): qty 125

## 2020-01-29 MED ORDER — ENOXAPARIN SODIUM 40 MG/0.4ML ~~LOC~~ SOLN
40.0000 mg | Freq: Every day | SUBCUTANEOUS | Status: DC
  Administered 2020-01-30 – 2020-01-31 (×2): 40 mg via SUBCUTANEOUS
  Filled 2020-01-29 (×2): qty 0.4

## 2020-01-29 MED ORDER — ACETAMINOPHEN 650 MG RE SUPP
650.0000 mg | Freq: Once | RECTAL | Status: AC
  Administered 2020-01-29: 650 mg via RECTAL
  Filled 2020-01-29: qty 1

## 2020-01-29 MED ORDER — VANCOMYCIN HCL IN DEXTROSE 1-5 GM/200ML-% IV SOLN
1000.0000 mg | Freq: Two times a day (BID) | INTRAVENOUS | Status: DC
  Administered 2020-01-30 – 2020-01-31 (×3): 1000 mg via INTRAVENOUS
  Filled 2020-01-29 (×3): qty 200

## 2020-01-29 MED ORDER — ACETAMINOPHEN 650 MG RE SUPP
650.0000 mg | RECTAL | Status: DC | PRN
  Administered 2020-01-29: 650 mg via RECTAL
  Filled 2020-01-29: qty 1

## 2020-01-29 MED ORDER — LABETALOL HCL 5 MG/ML IV SOLN: 10.0000 mg | INTRAVENOUS | Status: DC | PRN

## 2020-01-29 MED ORDER — INSULIN ASPART 100 UNIT/ML ~~LOC~~ SOLN
0.0000 [IU] | SUBCUTANEOUS | Status: DC
  Administered 2020-01-29 (×2): 2 [IU] via SUBCUTANEOUS
  Administered 2020-01-30: 1 [IU] via SUBCUTANEOUS
  Administered 2020-01-30 (×2): 2 [IU] via SUBCUTANEOUS
  Administered 2020-01-31: 1 [IU] via SUBCUTANEOUS
  Administered 2020-01-31: 2 [IU] via SUBCUTANEOUS
  Administered 2020-01-31 – 2020-02-01 (×5): 1 [IU] via SUBCUTANEOUS

## 2020-01-29 MED ORDER — DILTIAZEM HCL 25 MG/5ML IV SOLN
5.0000 mg | Freq: Once | INTRAVENOUS | Status: AC
  Administered 2020-01-29: 5 mg via INTRAVENOUS
  Filled 2020-01-29: qty 5

## 2020-01-29 NOTE — Procedures (Signed)
Patient Name: Jeffery Haynes  MRN: [id number]  Epilepsy Attending: Charlsie Quest  Referring Physician/Provider: Dr. Caryl Pina Date: 01/29/2020 Duration: 25.23 mins  Patient history: 86 year old male with history of Alzheimer's disease presented with altered mental status.  In the ED patient was noted to have an episode of eye rolling concerning for seizure.  EEG to evaluate for seizure.  Level of alertness: Asleep/ lethargic  AEDs during EEG study: None  Technical aspects: This EEG study was done with scalp electrodes positioned according to the 10-20 International system of electrode placement. Electrical activity was acquired at a sampling rate of 500Hz  and reviewed with a high frequency filter of 70Hz  and a low frequency filter of 1Hz . EEG data were recorded continuously and digitally stored.   Description:  Sleep was characterized by vertex waves, sleep spindles (12 to 14 Hz), maximal frontocentral region.  EEG showed continuous generalized ad lateralized right hemisphere 3 to 6 Hz theta-delta slowing. Single sharp transient was seen in right temporal region. Hyperventilation and photic stimulation were not performed.     ABNORMALITY -Continuous slow, generalized and lateralized right hemisphere  IMPRESSION: This study is suggestive of cortical dysfunction in right hemisphere which could be secondary to underlying stroke, post-ictal state. There is also moderate to severe diffuse encephalopathy, nonspecific etiology. No seizures or definite epileptiform discharges were seen throughout the recording   Nyliah Nierenberg 

## 2020-01-29 NOTE — ED Notes (Signed)
PLEASE CALL WIFE WITH AN UPDATE HER NUMBER IS IN THE CHART

## 2020-01-29 NOTE — ED Notes (Signed)
Pharmacy trying to locate Vancomycin.

## 2020-01-29 NOTE — ED Provider Notes (Signed)
[facility] EMERGENCY DEPARTMENT Provider Note   CSN: [id number] Arrival date & time: 01/29/20  1200     History Chief Complaint  Patient presents with  . Altered Mental Status    possible seizure activity     Jeffery Haynes is a 86 y.o. male with pertinent past medical history of Alzheimer's disease living at [facility] health facility presents by EMS for altered mental status.Spoke to Jeffery Haynes, Charity fundraiser from the facility who takes care of patient.  She states that he was acting normal last night, when they woke him this morning he was disoriented, not speaking as he normally does and had multiple episodes of eye rolling where he tended to look towards the right, this lasted about 5 minutes.  Patient was sent here for evaluation of seizure-like activity.  No tonic-clonic movements.  Upon my evaluation, patient is unable to speak to me due to dementia and altered mental status.  Patient is level 5 caveat.  Per Jeffery Haynes no unusual behavior yesterday, he was in normal health.  No fevers, nausea, vomiting, diarrhea.  Patient is full code. HPI     Past Medical History:  Diagnosis Date  . Alzheimer disease (HCC)   . Dementia (HCC)   . Diabetes mellitus without complication [facility])     Patient Active Problem List   Diagnosis Date Noted  . Alzheimer disease (HCC)   . Dementia (HCC)        No family history on file.  Social History   Tobacco Use  . Smoking status: Not on file  Substance Use Topics  . Alcohol use: Not on file  . Drug use: Not on file    Home Medications Prior to Admission medications   Medication Sig Start Date End Date Taking? Authorizing Provider  acetaminophen (TYLENOL) 325 MG tablet Take 650 mg by mouth See admin instructions. Take 650 mg by mouth three times a day and an additional 650 mg every six hours as needed for pain   Yes [provider]  amLODipine (NORVASC) 5 MG tablet Take 5 mg by mouth in the morning.   Yes [provider]  citalopram (CELEXA) 10 MG tablet Take 15 mg by mouth in the morning.   Yes [provider]  clopidogrel (PLAVIX) 75 MG tablet Take 75 mg by mouth in the morning.   Yes [provider]  diclofenac Sodium (VOLTAREN) 1 % GEL Apply 2 g topically See admin instructions. Apply 2 grams to both shoulders two times a day   Yes [provider]  divalproex (DEPAKOTE SPRINKLE) 125 MG capsule Take 250 mg by mouth in the morning and at bedtime.   Yes [provider]  metFORMIN (GLUCOPHAGE) 500 MG tablet Take 1,000 mg by mouth 2 (two) times daily.   Yes [provider]  Multiple Vitamins-Minerals (MULTIVITAMIN WITH MINERALS) tablet Take 1 tablet by mouth daily.   Yes [provider]  NONFORMULARY OR COMPOUNDED ITEM Apply 1 application topically See admin instructions. [facility] compounded gel: Lorazepam 0.5 mg/Diphenhydramine HCl 25 mg/Haloperidol 0.5 mg/ml and Lipoderm gel- Apply to skin every 12 hours AND an additional application every 8 hours as needed for restlessness or agitation   Yes [provider]  OLANZapine (ZYPREXA) 5 MG tablet Take 5 mg by mouth at bedtime.   Yes [provider]  polyethylene glycol powder (GLYCOLAX/MIRALAX) 17 GM/SCOOP powder Take 17 g by mouth daily.   Yes [provider]  sennosides-docusate sodium (SENOKOT-S) 8.6-50 MG tablet Take 2  tablets by mouth 2 (two) times daily.   Yes [provider]  tamsulosin (FLOMAX) 0.4 MG CAPS capsule Take 0.4 mg by mouth at bedtime.   Yes [provider]    Allergies    Patient has no known allergies.  Review of Systems   Review of Systems  Unable to perform ROS: Dementia    Physical Exam Updated Vital Signs BP (!) 191/91   Pulse (!) 118   Temp (!) 100.5 F (38.1 C) (Rectal)   Resp (!) 35   SpO2 95%   Physical Exam Constitutional:      General: He is not in acute distress.    Appearance: Normal appearance. He is  ill-appearing. He is not toxic-appearing or diaphoretic.  HENT:     Head: Normocephalic and atraumatic.     Mouth/Throat:     Mouth: Mucous membranes are moist.  Cardiovascular:     Rate and Rhythm: Regular rhythm. Tachycardia present.     Pulses: Normal pulses.  Pulmonary:     Effort: Pulmonary effort is normal. No respiratory distress.     Breath sounds: Normal breath sounds. No stridor. No wheezing.  Chest:     Chest wall: No tenderness.  Abdominal:     General: Abdomen is flat. There is no distension.     Tenderness: There is no abdominal tenderness. There is no guarding.  Musculoskeletal:        General: No swelling. Normal range of motion.     Cervical back: Normal range of motion.     Right lower leg: No edema.     Left lower leg: No edema.  Skin:    General: Skin is warm and dry.     Capillary Refill: Capillary refill takes less than 2 seconds.  Neurological:     Mental Status: He is alert. He is disoriented.     Comments: Moving right upper extremity, neuro deficits on left side from prior stroke     ED Results / Procedures / Treatments   Labs (all labs ordered are listed, but only abnormal results are displayed) Labs Reviewed  LACTIC ACID, PLASMA - Abnormal; Notable for the following components:      Result Value   Lactic Acid, Venous 3.9 (*)    All other components within normal limits  COMPREHENSIVE METABOLIC PANEL - Abnormal; Notable for the following components:   Potassium 5.2 (*)    Glucose, Bld 201 (*)    Total Protein 6.3 (*)    All other components within normal limits  CBC WITH DIFFERENTIAL/PLATELET - Abnormal; Notable for the following components:   WBC 13.7 (*)    Neutro Abs 11.8 (*)    Abs Immature Granulocytes 0.10 (*)    All other components within normal limits  URINALYSIS, ROUTINE W REFLEX MICROSCOPIC - Abnormal; Notable for the following components:   Color, Urine STRAW (*)    Glucose, UA >=500 (*)    Ketones, ur 5 (*)    All other  components within normal limits  URINE CULTURE  CULTURE, BLOOD (ROUTINE X 2)  CULTURE, BLOOD (ROUTINE X 2)  AMMONIA  PROTIME-INR  APTT  LACTIC ACID, PLASMA  VALPROIC ACID LEVEL  CK    EKG None  Radiology CT Head Wo Contrast  Result Date: 01/29/2020 CLINICAL DATA:  Delirium. Additional history provided: Altered mental status, possible seizure. EXAM: CT HEAD WITHOUT CONTRAST TECHNIQUE: Contiguous axial images were obtained from the base of the skull through the vertex without intravenous contrast. COMPARISON:  Head CT 09/28/2019. FINDINGS: Brain: Moderate generalized cerebral atrophy. Redemonstrated large chronic right ACA territory infarct. Background mild ill-defined hypoattenuation within the cerebral white matter is nonspecific, but compatible with chronic small vessel ischemic disease. There is no acute intracranial hemorrhage. No acute demarcated cortical infarct is identified. No extra-axial fluid collection. No evidence of intracranial mass. No midline shift. Vascular: No hyperdense vessel.  Atherosclerotic calcifications. Skull: Normal. Negative for fracture or focal lesion. Sinuses/Orbits: Visualized orbits show no acute finding. Mild ethmoid sinus mucosal thickening. Small right maxillary sinus mucous retention cyst. Trace fluid within the bilateral mastoid air cells. IMPRESSION: No evidence of acute intracranial abnormality. Redemonstrated large chronic right ACA territory infarct. Stable background moderate cerebral atrophy and mild chronic small vessel ischemic disease. Mild paranasal sinus disease as described. Trace bilateral mastoid effusions. Electronically Signed   By: Jackey Loge DO   On: 01/29/2020 14:59   DG Chest Port 1 View  Result Date: 01/29/2020 CLINICAL DATA:  86 year old male with possible sepsis. Possible seizure activity at 0730 hours. EXAM: PORTABLE CHEST 1 VIEW COMPARISON:  Portable chest 09/28/2019 and earlier. FINDINGS: Portable AP semi upright view at 1247  hours. Improved lung volumes. Mediastinal contours are stable with mild tortuosity of the aorta. Visualized tracheal air column is within normal limits. Allowing for portable technique the lungs are clear. Negative visible bowel gas pattern. No acute osseous abnormality identified. IMPRESSION: No acute cardiopulmonary abnormality. Electronically Signed   By: Odessa Fleming M.D.   On: 01/29/2020 12:56    Procedures .Critical Care Performed by: Farrel Gordon, PA-C Authorized by: Farrel Gordon, PA-C   Critical care provider statement:    Critical care time (minutes):  45   Critical care was necessary to treat or prevent imminent or life-threatening deterioration of the following conditions:  Sepsis   Critical care was time spent personally by me on the following activities:  Discussions with consultants, evaluation of patient's response to treatment, examination of patient, ordering and performing treatments and interventions, ordering and review of laboratory studies, ordering and review of radiographic studies, pulse oximetry, re-evaluation of patient's condition, obtaining history from patient or surrogate and review of old charts   (including critical care time)  Medications Ordered in ED Medications  lactated ringers infusion ( Intravenous New Bag/Given 01/29/20 1432)  vancomycin (VANCOREADY) IVPB 1750 mg/350 mL (has no administration in time range)  piperacillin-tazobactam (ZOSYN) IVPB 3.375 g (has no administration in time range)  vancomycin (VANCOCIN) IVPB 1000 mg/200 mL premix (has no administration in time range)  lactated ringers bolus 1,000 mL (0 mLs Intravenous Stopped 01/29/20 1358)    And  lactated ringers bolus 1,000 mL (0 mLs Intravenous Stopped 01/29/20 1358)    And  lactated ringers bolus 250 mL (0 mLs Intravenous Stopped 01/29/20 1425)  piperacillin-tazobactam (ZOSYN) IVPB 3.375 g (0 g Intravenous Stopped 01/29/20 1358)  diltiazem (CARDIZEM) injection 5 mg (5 mg Intravenous Given  01/29/20 1319)  acetaminophen (TYLENOL) suppository 650 mg (650 mg Rectal Given 01/29/20 1459)    ED Course  I have reviewed the triage vital signs and the nursing notes.  Pertinent labs & imaging results that were available during my care of the patient were reviewed by me and considered in my medical decision making (see chart for details).    MDM Rules/Calculators/A&P                          Jeffery Haynes is a 86 y.o. male with pertinent past medical  history of Alzheimer's disease living at [facility] health facility presents by EMS for altered mental status.  Patient unable to speak to me at this time, was able to obtain additional information from [place], RN at his assisted living facility.  Appears as if patient was at baseline yesterday, had acute neurological change this morning with concerns for seizure-like activity.  When patient arrived patient's pulse was 132 with rectal temperature of 100.5, and concern for sepsis at this time.  Sepsis order set initiated.  Differential also include hepatic encephalopathy, hypertensive encephalopathy, sepsis. Neck with normal range of motion.  EKG with baseline wander, most likely A. fib with RVR.  Will give 5 mg until at this time to control blood pressure and pulse.  Work-up today with lactic acid of 3.9, urinalysis, CMP unremarkable.  Chest x-ray interpreted with any acute cardiopulmonary disease...we will also add on Depakote level and CK at this time.  Differential to also include serotonin syndrome. No meningismus.   Upon reassessment patient is still nonverbal, tachycardia has improved to 103 and blood pressure has been decreasing with systolic of 178 at this time.  Patient will need admission at this time for sepsis with unknown source.   328 spoke to hospitalist, [facility] who accepts patient.  The patient appears reasonably stabilized for admission considering the current resources, flow, and capabilities available in the ED at this time,  and I doubt any other [facility] requiring further screening and/or treatment in the ED prior to admission.  I discussed this case with my attending physician who cosigned this note including patient's presenting symptoms, physical exam, and planned diagnostics and interventions. Attending physician stated agreement with plan or made changes to plan which were implemented.   Attending physician assessed patient at bedside.  Final Clinical Impression(s) / ED Diagnoses Final diagnoses:  Delirium  Sepsis without acute organ dysfunction, due to unspecified organism [facility])    Rx / DC Orders ED Discharge Orders    None       Farrel Gordon, PA-C 01/29/20 1547    Arby Barrette, MD 02/01/20 [phone]
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
## 2020-01-29 NOTE — Progress Notes (Signed)
Pharmacy Antibiotic Note  Jeffery Haynes is a 86 y.o. male admitted on 01/29/2020 with sepsis.  Pharmacy has been consulted for Zosyn and vancomycin dosing.  Plan: Zosyn 3.375 g IV q8h Vancomycin 1750 mg IV x1 followed by 1000 mg IV q12h Trough goal 15-20 mcg/mL Monitor cultures, clinical progress     Temp (24hrs), Avg:99.8 F (37.7 C), Min:99.1 F (37.3 C), Max:100.5 F (38.1 C)  Recent Labs  Lab 01/29/20 1224  WBC 13.7*    CrCl cannot be calculated (Patient's most recent lab result is older than the maximum 21 days allowed.).    No Known Allergies  Antimicrobials this admission: Zosyn 11/16 >>  Vancomycin 11/16 >>   Dose adjustments this admission:   Microbiology results: 11/16 BCx: sent 11/16 UCx:     Thank you for allowing pharmacy to be a part of this patient's care.  Pervis Hocking, PharmD PGY1 Pharmacy Resident 01/29/2020 1:09 PM  Please check [email or web address] for unit-specific pharmacy phone numbers.

## 2020-01-29 NOTE — Hospital Course (Addendum)
Jeffery Haynes is an 86 yo male with PMH large R ACA stroke (06/2016), Alzheimer's, HTN, DMII, seizure history who presented with AMS from his SNF. He was described to have had his "eyes roll in the back of his head" and has since been nonverbal (typically can talk).  He is usually bedbound/wheelchair bound.  His wife states that he can typically feed himself.  When he does talk and interact he can typically hold a conversation relatively well with intermittent times of confusion in the setting of his dementia.  His wife accompanies him in the ER and provides collateral information as needed.  The patient is nonverbal, noninteractive, and unable to provide any meaningful information.  Reviewed last neuro note from 05/10/17 in careeverywhere. Has had a seizure in July 2018 and was on vimpat which was stopped as his wife felt he was oversedated at that time.  EEG was done in 2012 when there was suspicion for a seizure as well but prior notes suggest that it was due to Seroquel.  EEG in 2012 showed R slowing and R sharp waves.  EEG in July 2018 showed diffuse slowing with focal slowing over the left. Because of wife's concern for sedation with seizure medication, he was weaned off of seizure medications with "watchful monitoring" for seizure activity.  If there was any seizure-like activity he would be recommended to restart seizure medication at that time.  He was to follow-up in 9 months from last neurology visit and there are no further notes to review from February 2019.  Appears that current med rec does have depakote noted and he resides in a SNF so compliance is assumed at this time.  His wife also states that he does typically take his medications.  If he refuses, staff usually waits for him to calm down and then reattempt later.  In the ER initial vitals were: Temp 99.1, heart rate 132, respirations 20, BP 182/122, 99% room air.  His temperature slightly trended up, 100.5.  Blood pressure remained  elevated 180s-200 for SBP, and he remained tachycardic. Notable labs include: Na 137, K 5.2, BUN 10, Cr 0.64 WBC 13.7, no bandemia, Hgb 14.8, Hct 47, PLTC 247 UA: neg nitrite, neg LE, >500 glucose, 0-5 WBC, no bacteria Lactic 3.9 Ammonia 32  CXR was clear. CTH showed moderate generalized cerebral atrophy, old large right ACA territory infarct, and chronic small vessel disease.  No acute hemorrhage or other acute findings.   Blood cultures were obtained.  He was given a dose of Zosyn and vancomycin for possible infection of unknown source given his low-grade fever, tachycardia, lactic acidosis, and intermittent tachypnea.Marland Kitchen  He is admitted for further work-up of seizure versus infection.  Neurology was consulted while in the ER.

## 2020-01-29 NOTE — ED Notes (Signed)
Patient transported to CT 

## 2020-01-29 NOTE — H&P (Signed)
History and Physical    Kaseem Vastine  TIR:[id number]  DOB: Aug 24, 1931  DOA: 01/29/2020  PCP: Patient, No Pcp Per Patient coming from: SNF  Chief Complaint: AMS  HPI:  Mr. Kielbasa is an 86 yo male with PMH large R ACA stroke (06/2016), Alzheimer's, HTN, DMII, seizure history who presented with AMS from his SNF. He was described to have had his "eyes roll in the back of his head" and has since been nonverbal (typically can talk).  He is usually bedbound/wheelchair bound.  His wife states that he can typically feed himself.  When he does talk and interact he can typically hold a conversation relatively well with intermittent times of confusion in the setting of his dementia.  His wife accompanies him in the ER and provides collateral information as needed.  The patient is nonverbal, noninteractive, and unable to provide any meaningful information.  Reviewed last neuro note from 05/10/17 in careeverywhere. Has had a seizure in July 2018 and was on vimpat which was stopped as his wife felt he was oversedated at that time.  EEG was done in 2012 when there was suspicion for a seizure as well but prior notes suggest that it was due to Seroquel.  EEG in 2012 showed R slowing and R sharp waves.  EEG in July 2018 showed diffuse slowing with focal slowing over the left. Because of wife's concern for sedation with seizure medication, he was weaned off of seizure medications with "watchful monitoring" for seizure activity.  If there was any seizure-like activity he would be recommended to restart seizure medication at that time.  He was to follow-up in 9 months from last neurology visit and there are no further notes to review from February 2019.  Appears that current med rec does have depakote noted and he resides in a SNF so compliance is assumed at this time.  His wife also states that he does typically take his medications.  If he refuses, staff usually waits for him to calm down and then reattempt  later.  In the ER initial vitals were: Temp 99.1, heart rate 132, respirations 20, BP 182/122, 99% room air.  His temperature slightly trended up, 100.5.  Blood pressure remained elevated 180s-200 for SBP, and he remained tachycardic. Notable labs include: Na 137, K 5.2, BUN 10, Cr 0.64 WBC 13.7, no bandemia, Hgb 14.8, Hct 47, PLTC 247 UA: neg nitrite, neg LE, >500 glucose, 0-5 WBC, no bacteria Lactic 3.9 Ammonia 32  CXR was clear. CTH showed moderate generalized cerebral atrophy, old large right ACA territory infarct, and chronic small vessel disease.  No acute hemorrhage or other acute findings.   Blood cultures were obtained.  He was given a dose of Zosyn and vancomycin for possible infection of unknown source given his low-grade fever, tachycardia, lactic acidosis, and intermittent tachypnea.Marland Kitchen  He is admitted for further work-up of seizure versus infection.  Neurology was consulted while in the ER.   I have personally briefly reviewed patient's old medical records in [facility] and discussed patient with the ER provider when appropriate/indicated.  Assessment/Plan: * Acute metabolic encephalopathy -Presentation is concerning for nonconvulsive seizure activity (unresponsive, subtle nystagmus, clenched teeth; SNF also reported "eyes rolled back") .  He has known seizure history at least in 2018 and has been on antiepileptics since.  Wife does not like Vimpat due to oversedation in the past and he has been on Depakote since.  Other suspicion in ER was infection due to his 100.5 temp, tachycardia,  tachypnea, lactic 3.9, WBC 13.7 however this can also be seen in a seizure. He does not appear toxic and there is no obvious source at this time (CXR clear, UA negative, nothing acute on CTH, abdomen soft, no LE edema/swelling, no obvious open wounds). This is not severe sepsis until there is a suspected source. - s/p vanc/zosyn in ER. Until seizure workup complete, reasonable to continue on  abx for now but quickly stop/de-escalate if no further suspicion of infection (careful for AKI with vanc/zosyn as cefepime on backorder/low supply) - follow up neuro eval - EEG ordered in ER STAT - await neuro recs for AED therapy and if load needed, etc; currently he is NPO and will need IV until awake/alert - trend PCT; follow up cultures   Atrial fibrillation with RVR (HCC) - no prior known history; possibly precipitated from seizure activity vs infection - BP elevated - treated with cardizem bolus in ER with no response; continue on cardizem drip - update echo  Alzheimer disease (HCC) -Wife present in the ER and able to provide collateral information; patient typically conversant and usually able to carry on conversation although does have times of dementia.  She states that he also can typically feed himself but is otherwise bedbound -Hold home meds, resume when awake and alert    Code Status: Partial (no intubation) DVT Prophylaxis: Lovenox Anticipated disposition is to: SNF  History: Past Medical History:  Diagnosis Date  . Alzheimer disease (HCC)   . Dementia (HCC)   . Diabetes mellitus without complication (HCC)     History reviewed. No pertinent surgical history.   has no history on file for tobacco use, alcohol use, and drug use.  No Known Allergies  Family History  Problem Relation Age of Onset  . Seizures Neg Hx    Home Medications: Prior to Admission medications   Medication Sig Start Date End Date Taking? Authorizing Provider  acetaminophen (TYLENOL) 325 MG tablet Take 650 mg by mouth See admin instructions. Take 650 mg by mouth three times a day and an additional 650 mg every six hours as needed for pain   Yes [provider]  amLODipine (NORVASC) 5 MG tablet Take 5 mg by mouth in the morning.   Yes [provider]  citalopram (CELEXA) 10 MG tablet Take 15 mg by mouth in the morning.   Yes [provider]  clopidogrel (PLAVIX) 75  MG tablet Take 75 mg by mouth in the morning.   Yes [provider]  diclofenac Sodium (VOLTAREN) 1 % GEL Apply 2 g topically See admin instructions. Apply 2 grams to both shoulders two times a day   Yes [provider]  divalproex (DEPAKOTE SPRINKLE) 125 MG capsule Take 250 mg by mouth in the morning and at bedtime.   Yes [provider]  metFORMIN (GLUCOPHAGE) 500 MG tablet Take 1,000 mg by mouth 2 (two) times daily.   Yes [provider]  Multiple Vitamins-Minerals (MULTIVITAMIN WITH MINERALS) tablet Take 1 tablet by mouth daily.   Yes [provider]  NONFORMULARY OR COMPOUNDED ITEM Apply 1 application topically See admin instructions. ABH compounded gel: Lorazepam 0.5 mg/Diphenhydramine HCl 25 mg/Haloperidol 0.5 mg/ml and Lipoderm gel- Apply to skin every 12 hours AND an additional application every 8 hours as needed for restlessness or agitation   Yes [provider]  OLANZapine (ZYPREXA) 5 MG tablet Take 5 mg by mouth at bedtime.   Yes [provider]  polyethylene glycol powder (GLYCOLAX/MIRALAX) 17  GM/SCOOP powder Take 17 g by mouth daily.   Yes [provider]  sennosides-docusate sodium (SENOKOT-S) 8.6-50 MG tablet Take 2 tablets by mouth 2 (two) times daily.   Yes [provider]  tamsulosin (FLOMAX) 0.4 MG CAPS capsule Take 0.4 mg by mouth at bedtime.   Yes [provider]    Review of Systems:  Review of systems not obtained due to patient factors. Cognitive impairment   Physical Exam: Vitals:   01/29/20 1345 01/29/20 1400 01/29/20 1430 01/29/20 1600  BP: (!) 206/97 (!) 197/99 (!) 191/91 (!) 184/94  Pulse: (!) 126 (!) 120 (!) 118 (!) 106  Resp: (!) 23 15 (!) 35 17  Temp:      TempSrc:      SpO2: 97% 96% 95% 96%   General appearance: nonverbal, noninteractive; laying in bed with teeth clinched, left arm contracted, intermittent response to sternal rub Head: Normocephalic, without obvious  abnormality, teetch clinched Eyes: subtle nystagmus; pupils 1-942mm minimally responsive to light Lungs: clear to auscultation bilaterally Heart: irregularly irregular rhythm and S1, S2 normal Abdomen: soft, ND, BS present Extremities: thin, no edema Skin: scattered excoriations on LE Neurologic: withdrew to pain in all 4 extremities; grimace to sternal rub; otherwise nonresponsive in bed  Labs on Admission:  I have personally reviewed following labs and imaging studies Results for orders placed or performed during the hospital encounter of 01/29/20 (from the past 24 hour(s))  Comprehensive metabolic panel     Status: Abnormal   Collection Time: 01/29/20 12:24 PM  Result Value Ref Range   Sodium 137 135 - 145 mmol/L   Potassium 5.2 (H) 3.5 - 5.1 mmol/L   Chloride 101 98 - 111 mmol/L   CO2 24 22 - 32 mmol/L   Glucose, Bld 201 (H) 70 - 99 mg/dL   BUN 10 8 - 23 mg/dL   Creatinine, Ser 6.210.64 0.61 - 1.24 mg/dL   Calcium 9.1 8.9 - 30.810.3 mg/dL   Total Protein 6.3 (L) 6.5 - 8.1 g/dL   Albumin 3.6 3.5 - 5.0 g/dL   AST 27 15 - 41 U/L   ALT 10 0 - 44 U/L   Alkaline Phosphatase 74 38 - 126 U/L   Total Bilirubin 0.8 0.3 - 1.2 mg/dL   GFR, Estimated >65>60 >78>60 mL/min   Anion gap 12 5 - 15  CBC WITH DIFFERENTIAL     Status: Abnormal   Collection Time: 01/29/20 12:24 PM  Result Value Ref Range   WBC 13.7 (H) 4.0 - 10.5 K/uL   RBC 5.11 4.22 - 5.81 MIL/uL   Hemoglobin 14.8 13.0 - 17.0 g/dL   HCT 46.947.0 39 - 52 %   MCV 92.0 80.0 - 100.0 fL   MCH 29.0 26.0 - 34.0 pg   MCHC 31.5 30.0 - 36.0 g/dL   RDW 62.912.7 52.811.5 - 41.315.5 %   Platelets 247 150 - 400 K/uL   nRBC 0.0 0.0 - 0.2 %   Neutrophils Relative % 86 %   Neutro Abs 11.8 (H) 1.7 - 7.7 K/uL   Lymphocytes Relative 8 %   Lymphs Abs 1.1 0.7 - 4.0 K/uL   Monocytes Relative 5 %   Monocytes Absolute 0.7 0.1 - 1.0 K/uL   Eosinophils Relative 0 %   Eosinophils Absolute 0.0 0.0 - 0.5 K/uL   Basophils Relative 0 %   Basophils Absolute 0.1 0.0 - 0.1 K/uL    Immature Granulocytes 1 %   Abs Immature Granulocytes 0.10 (H) 0.00 - 0.07  K/uL  Urinalysis, Routine w reflex microscopic     Status: Abnormal   Collection Time: 01/29/20 12:28 PM  Result Value Ref Range   Color, Urine STRAW (A) YELLOW   APPearance CLEAR CLEAR   Specific Gravity, Urine 1.012 1.005 - 1.030   pH 7.0 5.0 - 8.0   Glucose, UA >=500 (A) NEGATIVE mg/dL   Hgb urine dipstick NEGATIVE NEGATIVE   Bilirubin Urine NEGATIVE NEGATIVE   Ketones, ur 5 (A) NEGATIVE mg/dL   Protein, ur NEGATIVE NEGATIVE mg/dL   Nitrite NEGATIVE NEGATIVE   Leukocytes,Ua NEGATIVE NEGATIVE   WBC, UA 0-5 0 - 5 WBC/hpf   Bacteria, UA NONE SEEN NONE SEEN  Lactic acid, plasma     Status: Abnormal   Collection Time: 01/29/20  1:31 PM  Result Value Ref Range   Lactic Acid, Venous 3.9 (HH) 0.5 - 1.9 mmol/L  Ammonia     Status: None   Collection Time: 01/29/20  1:31 PM  Result Value Ref Range   Ammonia 32 9 - 35 umol/L  Protime-INR     Status: None   Collection Time: 01/29/20  1:31 PM  Result Value Ref Range   Prothrombin Time 13.2 11.4 - 15.2 seconds   INR 1.0 0.8 - 1.2  APTT     Status: None   Collection Time: 01/29/20  1:31 PM  Result Value Ref Range   aPTT 31 24 - 36 seconds  Valproic acid level     Status: Abnormal   Collection Time: 01/29/20  3:31 PM  Result Value Ref Range   Valproic Acid Lvl 20 (L) 50.0 - 100.0 ug/mL  CK     Status: Abnormal   Collection Time: 01/29/20  3:31 PM  Result Value Ref Range   Total CK 28 (L) 49.0 - 397.0 U/L  Lactic acid, plasma     Status: Abnormal   Collection Time: 01/29/20  3:46 PM  Result Value Ref Range   Lactic Acid, Venous 2.3 (HH) 0.5 - 1.9 mmol/L     Radiological Exams on Admission: CT Head Wo Contrast  Result Date: 01/29/2020 CLINICAL DATA:  Delirium. Additional history provided: Altered mental status, possible seizure. EXAM: CT HEAD WITHOUT CONTRAST TECHNIQUE: Contiguous axial images were obtained from the base of the skull through the vertex  without intravenous contrast. COMPARISON:  Head CT 09/28/2019. FINDINGS: Brain: Moderate generalized cerebral atrophy. Redemonstrated large chronic right ACA territory infarct. Background mild ill-defined hypoattenuation within the cerebral white matter is nonspecific, but compatible with chronic small vessel ischemic disease. There is no acute intracranial hemorrhage. No acute demarcated cortical infarct is identified. No extra-axial fluid collection. No evidence of intracranial mass. No midline shift. Vascular: No hyperdense vessel.  Atherosclerotic calcifications. Skull: Normal. Negative for fracture or focal lesion. Sinuses/Orbits: Visualized orbits show no acute finding. Mild ethmoid sinus mucosal thickening. Small right maxillary sinus mucous retention cyst. Trace fluid within the bilateral mastoid air cells. IMPRESSION: No evidence of acute intracranial abnormality. Redemonstrated large chronic right ACA territory infarct. Stable background moderate cerebral atrophy and mild chronic small vessel ischemic disease. Mild paranasal sinus disease as described. Trace bilateral mastoid effusions. Electronically Signed   By: Jackey Loge DO   On: 01/29/2020 14:59   DG Chest Port 1 View  Result Date: 01/29/2020 CLINICAL DATA:  86 year old male with possible sepsis. Possible seizure activity at 0730 hours. EXAM: PORTABLE CHEST 1 VIEW COMPARISON:  Portable chest 09/28/2019 and earlier. FINDINGS: Portable AP semi upright view at 1247 hours. Improved lung volumes. Mediastinal  contours are stable with mild tortuosity of the aorta. Visualized tracheal air column is within normal limits. Allowing for portable technique the lungs are clear. Negative visible bowel gas pattern. No acute osseous abnormality identified. IMPRESSION: No acute cardiopulmonary abnormality. Electronically Signed   By: Odessa Fleming M.D.   On: 01/29/2020 12:56   CT Head Wo Contrast  Final Result    DG Chest [facility]  Final Result       Consults called:  Neuro   EKG: Independently reviewed. afib with RVR   Lewie Chamber, MD [facility] 01/29/2020, 4:54 PM

## 2020-01-29 NOTE — ED Notes (Signed)
Date and time results received: 01/29/20 1332 (use smartphrase ".now" to insert current time)  Test: Lactic acid Critical Value: 3.9  Name of Provider Notified: Dr. Donnald Garre  Orders Received? Or Actions Taken?: Orders Received - See Orders for details and Actions Taken: waiting for new orders

## 2020-01-29 NOTE — ED Notes (Signed)
MD at bedside to reassess patient.

## 2020-01-29 NOTE — ED Notes (Signed)
Pts O2 sats dropped to 83% on RA. Placed on [place] 5L momentarily to get sats above 92. O2 now at 2L [place], Sats are 97%. MD notified of change in patients respiratory status.

## 2020-01-29 NOTE — Progress Notes (Signed)
STAT EEG completed; results pending. Dr Yadav notified. 

## 2020-01-29 NOTE — ED Notes (Signed)
Unable to do neurological assessment on pt. Pt is non verbal for this nurse. Repetitively rubs head. Does not appear to make eye contact when spoken to. Does not appear to move his left arm. Head CT pending. Antibiotics infusing after blood cultures X2 sent. MD aware of pts elevated BP and HR.

## 2020-01-29 NOTE — ED Notes (Signed)
Help get patient cleaned up and new brief placed on patient is resting with call bell in reach

## 2020-01-29 NOTE — Assessment & Plan Note (Addendum)
-  Wife present in the ER and able to provide collateral information; patient typically conversant and usually able to carry on conversation although does have times of dementia.  She states that he also can typically feed himself but is otherwise bedbound -Hold home meds, resume when awake and alert

## 2020-01-29 NOTE — Assessment & Plan Note (Signed)
-   no prior known history; possibly precipitated from seizure activity vs infection - BP elevated - treated with cardizem bolus in ER with no response; continue on cardizem drip - update echo

## 2020-01-29 NOTE — ED Notes (Signed)
Got patient into a gown on the monitor patient is resting with call bell in reach 

## 2020-01-29 NOTE — ED Notes (Signed)
Patient transported from CT. Brief changed, Tylenol PR given, pt appears to be more comfortable lying flat.

## 2020-01-29 NOTE — Assessment & Plan Note (Signed)
-  Presentation is concerning for nonconvulsive seizure activity (unresponsive, subtle nystagmus, clenched teeth; SNF also reported "eyes rolled back") .  He has known seizure history at least in 2018 and has been on antiepileptics since.  Wife does not like Vimpat due to oversedation in the past and he has been on Depakote since.  Other suspicion in ER was infection due to his 100.5 temp, tachycardia, tachypnea, lactic 3.9, WBC 13.7 however this can also be seen in a seizure. He does not appear toxic and there is no obvious source at this time (CXR clear, UA negative, nothing acute on CTH, abdomen soft, no LE edema/swelling, no obvious open wounds). This is not severe sepsis until there is a suspected source. - s/p vanc/zosyn in ER. Until seizure workup complete, reasonable to continue on abx for now but quickly stop/de-escalate if no further suspicion of infection (careful for AKI with vanc/zosyn as cefepime on backorder/low supply) - follow up neuro eval - EEG ordered in ER STAT - await neuro recs for AED therapy and if load needed, etc; currently he is NPO and will need IV until awake/alert - trend PCT; follow up cultures

## 2020-01-29 NOTE — ED Triage Notes (Signed)
BIB by [facility] after [facility] facility called to report pt having AMS related to possible seizure activity since 0730 AM. PT at baseline has dementia but could talk and obey some commands prior to this morning. Seizure activity unwitnessed, but facility reports that pt eyes did roll in the back of head. HX of DM, HTN, Dementia.

## 2020-01-29 NOTE — ED Notes (Signed)
Unable to abscess

## 2020-01-29 NOTE — ED Notes (Signed)
Pt appears much more comfortable. Will hold Cardizem per MD as HR is 103.

## 2020-01-30 ENCOUNTER — Inpatient Hospital Stay (HOSPITAL_COMMUNITY): Payer: Medicare Other

## 2020-01-30 ENCOUNTER — Encounter (HOSPITAL_COMMUNITY): Payer: Self-pay | Admitting: Internal Medicine

## 2020-01-30 DIAGNOSIS — F0280 Dementia in other diseases classified elsewhere without behavioral disturbance: Secondary | ICD-10-CM | POA: Diagnosis not present

## 2020-01-30 DIAGNOSIS — G309 Alzheimer's disease, unspecified: Secondary | ICD-10-CM | POA: Diagnosis not present

## 2020-01-30 DIAGNOSIS — I4891 Unspecified atrial fibrillation: Secondary | ICD-10-CM | POA: Diagnosis not present

## 2020-01-30 LAB — BASIC METABOLIC PANEL
Anion gap: 8 (ref 5–15)
BUN: 7 mg/dL — ABNORMAL LOW (ref 8–23)
CO2: 28 mmol/L (ref 22–32)
Calcium: 9 mg/dL (ref 8.9–10.3)
Chloride: 97 mmol/L — ABNORMAL LOW (ref 98–111)
Creatinine, Ser: 0.75 mg/dL (ref 0.61–1.24)
GFR, Estimated: >60 mL/min (ref >60)
Glucose, Bld: 125 mg/dL — ABNORMAL HIGH (ref 70–99)
Potassium: 3.7 mmol/L (ref 3.5–5.1)
Sodium: 133 mmol/L — ABNORMAL LOW (ref 135–145)

## 2020-01-30 LAB — CBC WITH DIFFERENTIAL/PLATELET
Abs Immature Granulocytes: 0.05 10*3/uL (ref 0.00–0.07)
Basophils Absolute: 0 10*3/uL (ref 0.0–0.1)
Basophils Relative: 0 %
Eosinophils Absolute: 0 10*3/uL (ref 0.0–0.5)
Eosinophils Relative: 0 %
HCT: 45.8 % (ref 39.0–52.0)
Hemoglobin: 14.5 g/dL (ref 13.0–17.0)
Immature Granulocytes: 1 %
Lymphocytes Relative: 18 %
Lymphs Abs: 2 10*3/uL (ref 0.7–4.0)
MCH: 29.2 pg (ref 26.0–34.0)
MCHC: 31.7 g/dL (ref 30.0–36.0)
MCV: 92.3 fL (ref 80.0–100.0)
Monocytes Absolute: 1.1 10*3/uL — ABNORMAL HIGH (ref 0.1–1.0)
Monocytes Relative: 10 %
Neutro Abs: 7.5 10*3/uL (ref 1.7–7.7)
Neutrophils Relative %: 71 %
Platelets: 270 10*3/uL (ref 150–400)
RBC: 4.96 MIL/uL (ref 4.22–5.81)
RDW: 13 % (ref 11.5–15.5)
WBC: 10.6 10*3/uL — ABNORMAL HIGH (ref 4.0–10.5)
nRBC: 0 % (ref 0.0–0.2)

## 2020-01-30 LAB — URINE CULTURE

## 2020-01-30 LAB — CBG MONITORING, ED
Glucose-Capillary: 159 mg/dL — ABNORMAL HIGH (ref 70–99)
Glucose-Capillary: 116 mg/dL — ABNORMAL HIGH (ref 70–99)
Glucose-Capillary: 142 mg/dL — ABNORMAL HIGH (ref 70–99)

## 2020-01-30 LAB — GLUCOSE, CAPILLARY
Glucose-Capillary: 121 mg/dL — ABNORMAL HIGH (ref 70–99)
Glucose-Capillary: 120 mg/dL — ABNORMAL HIGH (ref 70–99)
Glucose-Capillary: 153 mg/dL — ABNORMAL HIGH (ref 70–99)

## 2020-01-30 LAB — PROCALCITONIN: Procalcitonin: <0.1 ng/mL

## 2020-01-30 LAB — ECHOCARDIOGRAM COMPLETE: S' Lateral: 3.5 cm

## 2020-01-30 LAB — MAGNESIUM: Magnesium: 1.4 mg/dL — ABNORMAL LOW (ref 1.7–2.4)

## 2020-01-30 LAB — MRSA PCR SCREENING: MRSA by PCR: NEGATIVE

## 2020-01-30 LAB — RESPIRATORY PANEL BY RT PCR (FLU A&B, COVID)
Influenza A by PCR: NEGATIVE
Influenza B by PCR: NEGATIVE
SARS Coronavirus 2 by RT PCR: NEGATIVE

## 2020-01-30 MED ORDER — DEXTROSE-NACL 5-0.9 % IV SOLN: INTRAVENOUS | Status: DC

## 2020-01-30 MED ORDER — MAGNESIUM SULFATE 2 GM/50ML IV SOLN
2.0000 g | Freq: Once | INTRAVENOUS | Status: AC
  Administered 2020-01-30: 2 g via INTRAVENOUS
  Filled 2020-01-30: qty 50

## 2020-01-30 MED ORDER — SODIUM CHLORIDE 0.9 % IV SOLN
INTRAVENOUS | Status: DC | PRN
  Administered 2020-01-30: 250 mL via INTRAVENOUS

## 2020-01-30 MED ORDER — PNEUMOCOCCAL VAC POLYVALENT 25 MCG/0.5ML IJ INJ
0.5000 mL | INJECTION | INTRAMUSCULAR | Status: AC
  Administered 2020-01-31: 0.5 mL via INTRAMUSCULAR
  Filled 2020-01-30: qty 0.5

## 2020-01-30 MED ORDER — PERFLUTREN LIPID MICROSPHERE
1.0000 mL | INTRAVENOUS | Status: AC | PRN
  Administered 2020-01-30: 6 mL via INTRAVENOUS
  Filled 2020-01-30: qty 10

## 2020-01-30 MED ORDER — INFLUENZA VAC A&B SA ADJ QUAD 0.5 ML IM PRSY
0.5000 mL | PREFILLED_SYRINGE | INTRAMUSCULAR | Status: AC
  Administered 2020-01-31: 0.5 mL via INTRAMUSCULAR
  Filled 2020-01-30: qty 0.5

## 2020-01-30 MED FILL — Perflutren Lipid Microsphere IV Susp 1.1 MG/ML: INTRAVENOUS | Qty: 10 | Status: AC

## 2020-01-30 NOTE — Progress Notes (Signed)
PROGRESS NOTE  Jeffery Haynes SKA:[id number] DOB: 1931-04-22 DOA: 01/29/2020 PCP: Patient, No Pcp Per   LOS: 1 day   Brief narrative: As per HPI,  Jeffery Haynes is an 86 years old male with  large Right ACA stroke (06/2016), Alzheimer's, HTN, DM type II, seizure history  presented hospital with complaints of altered mental status from skilled nursing facility.  He was noted to have rolling of his eyes in the back of his head at the nursing home and is nonverbal.  He typically is wheelchair bound but able to verbalize and feel himself.  He does have intermittent episodes of confusion and background of his dementia.  Patient had history of seizure in July 2018 and was on Vimpat which was weaned off due to sedation.  And was to resume if seizure-like activity.  On arrival to the hospital this time patient does have difficulty in his medication list.  In the ED patient had a mild leukocytosis.  UA showed glucosuria.  Ammonia was 32. CXR was clear. CT Head showed moderate generalized cerebral atrophy, old large right ACA territory infarct, and chronic small vessel disease.  No acute hemorrhage or other acute findings. Blood cultures were obtained.  He was given a dose of Zosyn and vancomycin for possible infection of unknown source given his low-grade fever, tachycardia, lactic acidosis, and intermittent tachypnea.  Patient presented to the hospital forwork-up of seizure versus infection.  Neurology was consulted while in the ER.   Assessment/Plan:  Principal Problem:   Acute metabolic encephalopathy Active Problems:   Alzheimer disease (HCC)   Atrial fibrillation with RVR (HCC)  Acute metabolic encephalopathy.  Questionable concern for seizures versus infection on admission.  Patient appears to rigid on physical examination.  Neurology has seen the patient and Celexa and olanzapine has been discontinued for possible concern of neuroleptic malignant syndrome.  We will add CK levels and LFTs. Chest  x-ray was clear.  UA without any acute findings.  Patient received vancomycin and Zosyn in the ED.    EEG focal seizures.  Neuro on board.  Blood culture negative so far.  Urine culture with multiple species.  Keep the patient NPO.  Continue IV fluids for now.  Appears to be mildly volume depleted.  WBC 10.6.  Procalcitonin less than 0.10.  Paroxysmal atrial fibrillation with RVR.  Patient was given Cardizem bolus in the ED followed by Cardizem drip.  At this time, he had bradycardia so Cardizem on hold.   2D echocardiogram from 01/30/2020 showed left ventricle ejection fraction of 55 to 60% with no regional wall motion abnormality.  Diastolic function could not be evaluated.  Left atrial size was normal.  History of Alzheimer's dementia.  Able to carry conversation at baseline.  Mostly bedbound but able to feed by himself.  Spoke with the patient's wife at bedside.  Diabetes mellitus type 2.  Continue sliding scale insulin, Accu-Cheks.  Patient will be n.p.o. so we will continue with D5 normal saline and cover with sliding scale insulin.  Hyponatremia.  Likely volume depletion.  Sodium of 133.  Continue with IV fluids.  Lactate was slightly elevated at 2.3 improved from 3.9.  Check lactate in a.m..  Bedbound status total care except for some feeding.  Is from skilled nursing facility.  Hypomagnesemia.  Will give 2 g of IV magnesium today.    DVT prophylaxis: enoxaparin (LOVENOX) injection 40 mg Start: 01/30/20 1000   Code Status: Partial code, no intubation  Family Communication: Spoke with the patient's  wife at bedside.  Status is: Inpatient  Remains inpatient appropriate because:IV treatments appropriate due to intensity of illness or inability to take PO and Inpatient level of care appropriate due to severity of illness   Dispo: The patient is from: SNF              Anticipated d/c is to: SNF              Anticipated d/c date is: 2 days              Patient currently is not  medically stable to d/c.  Consultants:  Neurology  Procedures:  EEG  Antibiotics:  . Vancomycin and Zosyn IV  Anti-infectives (From admission, onward)   Start     Dose/Rate Route Frequency Ordered Stop   01/30/20 0230  vancomycin (VANCOCIN) IVPB 1000 mg/200 mL premix        1,000 mg 200 mL/hr over 60 Minutes Intravenous Every 12 hours 01/29/20 1358     01/29/20 2000  piperacillin-tazobactam (ZOSYN) IVPB 3.375 g        3.375 g 12.5 mL/hr over 240 Minutes Intravenous Every 8 hours 01/29/20 1358     01/29/20 1315  piperacillin-tazobactam (ZOSYN) IVPB 3.375 g        3.375 g 100 mL/hr over 30 Minutes Intravenous  Once 01/29/20 1306 01/29/20 1358   01/29/20 1315  vancomycin (VANCOREADY) IVPB 1750 mg/350 mL        1,750 mg 175 mL/hr over 120 Minutes Intravenous  Once 01/29/20 1306 01/29/20 2142     Subjective: Today, patient was seen and examined at bedside.  Patient is nonverbal.  Minimally interactive on verbal command.  Closing his eyes  Objective: Vitals:   01/30/20 1327 01/30/20 1606  BP: 112/68 121/63  Pulse: 62 (!) 56  Resp: 18 19  Temp:    SpO2: 99% 97%    Intake/Output Summary (Last 24 hours) at 01/30/2020 1608 Last data filed at 01/30/2020 1552 Gross per 24 hour  Intake 409.36 ml  Output --  Net 409.36 ml   There were no vitals filed for this visit. There is no height or weight on file to calculate BMI.   Physical Exam: GENERAL: Patient is responsive on verbal command, closing his eyes, appears to be rigid and stiff HENT: No scleral pallor or icterus. Pupils equally reactive to light. Oral mucosa is mildly dry. NECK: is supple, no gross swelling noted. CHEST: Clear to auscultation. No crackles or wheezes.  Diminished breath sounds bilaterally. CVS: S1 and S2 heard, no murmur.  Irregular rhythm. ABDOMEN: Soft, non-tender, bowel sounds are present. EXTREMITIES: No edema.  Generalized stiffness of the face and extremities. CNS: Grossly nonfocal, stiffness  of his body. SKIN: warm and dry without rashes.  Decreased skin turgor.  Data Review: I have personally reviewed the following laboratory data and studies,  CBC: Recent Labs  Lab 01/29/20 1224 01/30/20 0224  WBC 13.7* 10.6*  NEUTROABS 11.8* 7.5  HGB 14.8 14.5  HCT 47.0 45.8  MCV 92.0 92.3  PLT 247 270   Basic Metabolic Panel: Recent Labs  Lab 01/29/20 1224 01/30/20 0224  NA 137 133*  K 5.2* 3.7  CL 101 97*  CO2 24 28  GLUCOSE 201* 125*  BUN 10 7*  CREATININE 0.64 0.75  CALCIUM 9.1 9.0  MG  --  1.4*   Liver Function Tests: Recent Labs  Lab 01/29/20 1224  AST 27  ALT 10  ALKPHOS 74  BILITOT 0.8  PROT 6.3*  ALBUMIN 3.6   No results for input(s): LIPASE, AMYLASE in the last 168 hours. Recent Labs  Lab 01/29/20 1331  AMMONIA 32   Cardiac Enzymes: Recent Labs  Lab 01/29/20 1531  CKTOTAL 28*   BNP (last 3 results) No results for input(s): BNP in the last 8760 hours.  ProBNP (last 3 results) No results for input(s): PROBNP in the last 8760 hours.  CBG: Recent Labs  Lab 01/30/20 0351 01/30/20 0831 01/30/20 1218 01/30/20 1327 01/30/20 1558  GLUCAP 159* 116* 142* 121* 120*   Recent Results (from the past 240 hour(s))  Blood Culture (routine x 2)     Status: None (Preliminary result)   Collection Time: 01/29/20  1:29 PM   Specimen: BLOOD  Result Value Ref Range Status   Specimen Description BLOOD SITE NOT SPECIFIED  Final   Special Requests   Final    BOTTLES DRAWN AEROBIC AND ANAEROBIC Blood Culture adequate volume   Culture   Final    NO GROWTH < 24 HOURS Performed at [facility], [address], [place], [place] [address]    Report Status PENDING  Incomplete  Blood Culture (routine x 2)     Status: None (Preliminary result)   Collection Time: 01/29/20  1:30 PM   Specimen: BLOOD  Result Value Ref Range Status   Specimen Description BLOOD SITE NOT SPECIFIED  Final   Special Requests   Final    BOTTLES DRAWN AEROBIC AND ANAEROBIC  Blood Culture results may not be optimal due to an inadequate volume of blood received in culture bottles   Culture   Final    NO GROWTH < 24 HOURS Performed at [facility], [address] [place], [place] [address]    Report Status PENDING  Incomplete  Urine culture     Status: Abnormal   Collection Time: 01/29/20  3:06 PM   Specimen: In/Out Cath Urine  Result Value Ref Range Status   Specimen Description IN/OUT CATH URINE  Final   Special Requests   Final    NONE Performed at [facility], [address] [place], [place] [address]    Culture MULTIPLE SPECIES PRESENT, SUGGEST RECOLLECTION (A)  Final   Report Status 01/30/2020 FINAL  Final     Studies: EEG  Result Date: 01/29/2020 Charlsie Quest, MD     01/29/2020  5:13 PM Patient Name: Jeffery Haynes MRN: [id number] Epilepsy Attending: Charlsie Quest Referring Physician/Provider: Dr. Caryl Pina Date: 01/29/2020 Duration: 25.23 mins Patient history: 86 year old male with history of Alzheimer's disease presented with altered mental status.  In the ED patient was noted to have an episode of eye rolling concerning for seizure.  EEG to evaluate for seizure. Level of alertness: Asleep/ lethargic AEDs during EEG study: None Technical aspects: This EEG study was done with scalp electrodes positioned according to the 10-20 International system of electrode placement. Electrical activity was acquired at a sampling rate of 500Hz  and reviewed with a high frequency filter of 70Hz  and a low frequency filter of 1Hz . EEG data were recorded continuously and digitally stored. Description:  Sleep was characterized by vertex waves, sleep spindles (12 to 14 Hz), maximal frontocentral region.  EEG showed continuous generalized ad lateralized right hemisphere 3 to 6 Hz theta-delta slowing. Single sharp transient was seen in right temporal region. Hyperventilation and photic stimulation were not performed.   ABNORMALITY -Continuous slow,  generalized and lateralized right hemisphere IMPRESSION: This study is suggestive of cortical dysfunction  in right hemisphere which could be secondary to underlying stroke, post-ictal state. There is also moderate to severe diffuse encephalopathy, nonspecific etiology. No seizures or definite epileptiform discharges were seen throughout the recording Charlsie Quest   CT Head Wo Contrast  Result Date: 01/29/2020 CLINICAL DATA:  Delirium. Additional history provided: Altered mental status, possible seizure. EXAM: CT HEAD WITHOUT CONTRAST TECHNIQUE: Contiguous axial images were obtained from the base of the skull through the vertex without intravenous contrast. COMPARISON:  Head CT 09/28/2019. FINDINGS: Brain: Moderate generalized cerebral atrophy. Redemonstrated large chronic right ACA territory infarct. Background mild ill-defined hypoattenuation within the cerebral white matter is nonspecific, but compatible with chronic small vessel ischemic disease. There is no acute intracranial hemorrhage. No acute demarcated cortical infarct is identified. No extra-axial fluid collection. No evidence of intracranial mass. No midline shift. Vascular: No hyperdense vessel.  Atherosclerotic calcifications. Skull: Normal. Negative for fracture or focal lesion. Sinuses/Orbits: Visualized orbits show no acute finding. Mild ethmoid sinus mucosal thickening. Small right maxillary sinus mucous retention cyst. Trace fluid within the bilateral mastoid air cells. IMPRESSION: No evidence of acute intracranial abnormality. Redemonstrated large chronic right ACA territory infarct. Stable background moderate cerebral atrophy and mild chronic small vessel ischemic disease. Mild paranasal sinus disease as described. Trace bilateral mastoid effusions. Electronically Signed   By: Jackey Loge DO   On: 01/29/2020 14:59   DG CHEST PORT 1 VIEW  Result Date: 01/29/2020 CLINICAL DATA:  Acute hypoxic respiratory failure. EXAM: PORTABLE CHEST  1 VIEW COMPARISON:  Earlier film, same date. FINDINGS: The cardiac silhouette, mediastinal and hilar contours are stable. Low lung volumes with vascular crowding and streaky basilar atelectasis. No definite infiltrates or effusions. No pneumothorax. IMPRESSION: Low lung volumes with vascular crowding and streaky basilar atelectasis. Electronically Signed   By: Rudie Meyer M.D.   On: 01/29/2020 18:34   DG Chest Port 1 View  Result Date: 01/29/2020 CLINICAL DATA:  86 year old male with possible sepsis. Possible seizure activity at 0730 hours. EXAM: PORTABLE CHEST 1 VIEW COMPARISON:  Portable chest 09/28/2019 and earlier. FINDINGS: Portable AP semi upright view at 1247 hours. Improved lung volumes. Mediastinal contours are stable with mild tortuosity of the aorta. Visualized tracheal air column is within normal limits. Allowing for portable technique the lungs are clear. Negative visible bowel gas pattern. No acute osseous abnormality identified. IMPRESSION: No acute cardiopulmonary abnormality. Electronically Signed   By: Odessa Fleming M.D.   On: 01/29/2020 12:56   ECHOCARDIOGRAM COMPLETE  Result Date: 01/30/2020    ECHOCARDIOGRAM REPORT   Patient Name:   Jeffery Haynes Date of Exam: 01/30/2020 Medical Rec #:  [id number]        Height:       72.0 in Accession #:    [id number]       Weight:       162.0 lb Date of Birth:  15-Dec-1931        BSA:          1.948 m Patient Age:    88 years         BP:           128/65 mmHg Patient Gender: M                HR:           60 bpm. Exam Location:  Inpatient Procedure: 2D Echo, Cardiac Doppler, Color Doppler and Intracardiac            Opacification Agent Indications:  Atrial fibrillation  History:        Patient has no prior history of Echocardiogram examinations.                 Risk Factors:Hypertension and Diabetes. H/O stroke.  Sonographer:    Ross Ludwig RDCS (AE) Referring Phys: [id number] DAVID GIRGUIS IMPRESSIONS  1. Left ventricular ejection fraction, by estimation,  is 55 to 60%. The left ventricle has normal function. The left ventricle has no regional wall motion abnormalities. There is moderate concentric left ventricular hypertrophy. Left ventricular diastolic function could not be evaluated.  2. Right ventricular systolic function is normal. The right ventricular size is normal. Mildly increased right ventricular wall thickness.  3. The mitral valve is normal in structure. No evidence of mitral valve regurgitation.  4. The aortic valve is grossly normal. Aortic valve regurgitation is not visualized.  5. There is borderline dilatation of the ascending aorta, measuring 39 mm. Comparison(s): No prior Echocardiogram. FINDINGS  Left Ventricle: Suboptimal gradients. Left ventricular ejection fraction, by estimation, is 55 to 60%. The left ventricle has normal function. The left ventricle has no regional wall motion abnormalities. Definity contrast agent was given IV to delineate the left ventricular endocardial borders. The left ventricular internal cavity size was normal in size. There is moderate concentric left ventricular hypertrophy. Left ventricular diastolic function could not be evaluated. Right Ventricle: The right ventricular size is normal. Mildly increased right ventricular wall thickness. Right ventricular systolic function is normal. Left Atrium: Left atrial size was normal in size. Right Atrium: Right atrial size was normal in size. Pericardium: There is no evidence of pericardial effusion. Mitral Valve: The mitral valve is normal in structure. No evidence of mitral valve regurgitation. Tricuspid Valve: The tricuspid valve is grossly normal. Tricuspid valve regurgitation is not demonstrated. Aortic Valve: The aortic valve is grossly normal. Aortic valve regurgitation is not visualized. Pulmonic Valve: The pulmonic valve was normal in structure. Pulmonic valve regurgitation is not visualized. Aorta: The aortic root is normal in size and structure. There is  borderline dilatation of the ascending aorta, measuring 39 mm. Venous: The pulmonary veins were not well visualized. IAS/Shunts: The atrial septum is grossly normal.  LEFT VENTRICLE PLAX 2D LVIDd:         5.00 cm LVIDs:         3.50 cm LV PW:         1.30 cm LV IVS:        1.30 cm LVOT diam:     2.20 cm LV SV:         46 LV SV Index:   24 LVOT Area:     3.80 cm  RIGHT VENTRICLE RV Basal diam:  3.50 cm RV Mid diam:    3.00 cm RV S prime:     13.70 cm/s TAPSE (M-mode): 1.8 cm LEFT ATRIUM             Index       RIGHT ATRIUM           Index LA diam:        2.80 cm 1.44 cm/m  RA Area:     16.50 cm LA Vol (A2C):   31.8 ml 16.32 ml/m RA Volume:   42.60 ml  21.87 ml/m LA Vol (A4C):   39.3 ml 20.17 ml/m LA Biplane Vol: 36.1 ml 18.53 ml/m  AORTIC VALVE LVOT Vmax:   67.90 cm/s LVOT Vmean:  42.733 cm/s LVOT VTI:    0.121 m  AORTA Ao Root diam: 3.40 cm Ao Asc diam:  3.90 cm  SHUNTS Systemic VTI:  0.12 m Systemic Diam: 2.20 cm Riley LamMahesh Chandrasekhar MD Electronically signed by Riley LamMahesh Chandrasekhar MD Signature Date/Time: 01/30/2020/2:26:14 PM    Final       Joycelyn DasLaxman Gad Aymond, MD  [facility] 01/30/2020

## 2020-01-30 NOTE — ED Notes (Signed)
Pt's wife updated.

## 2020-01-30 NOTE — ED Notes (Signed)
Dr. Tyson Babinski notified of bradycardia and discontinuation of Cardizem drip; verbal order given to restart cardizem if HR greater than 100

## 2020-01-30 NOTE — Progress Notes (Signed)
  Echocardiogram 2D Echocardiogram has been performed.  Gerda Diss 01/30/2020, 12:09 PM

## 2020-01-30 NOTE — ED Notes (Signed)
Patient CBG was 116. 

## 2020-01-31 DIAGNOSIS — G309 Alzheimer's disease, unspecified: Secondary | ICD-10-CM | POA: Diagnosis not present

## 2020-01-31 DIAGNOSIS — I4891 Unspecified atrial fibrillation: Secondary | ICD-10-CM | POA: Diagnosis not present

## 2020-01-31 DIAGNOSIS — E869 Volume depletion, unspecified: Secondary | ICD-10-CM

## 2020-01-31 DIAGNOSIS — G9341 Metabolic encephalopathy: Secondary | ICD-10-CM | POA: Diagnosis not present

## 2020-01-31 DIAGNOSIS — R410 Disorientation, unspecified: Secondary | ICD-10-CM | POA: Diagnosis not present

## 2020-01-31 DIAGNOSIS — F0280 Dementia in other diseases classified elsewhere without behavioral disturbance: Secondary | ICD-10-CM | POA: Diagnosis not present

## 2020-01-31 LAB — CBC WITH DIFFERENTIAL/PLATELET
Abs Immature Granulocytes: 0.07 10*3/uL (ref 0.00–0.07)
Basophils Absolute: 0.1 10*3/uL (ref 0.0–0.1)
Basophils Relative: 1 %
Eosinophils Absolute: 0 10*3/uL (ref 0.0–0.5)
Eosinophils Relative: 1 %
HCT: 42.9 % (ref 39.0–52.0)
Hemoglobin: 13.5 g/dL (ref 13.0–17.0)
Immature Granulocytes: 1 %
Lymphocytes Relative: 29 %
Lymphs Abs: 2.1 10*3/uL (ref 0.7–4.0)
MCH: 29.2 pg (ref 26.0–34.0)
MCHC: 31.5 g/dL (ref 30.0–36.0)
MCV: 92.9 fL (ref 80.0–100.0)
Monocytes Absolute: 1 10*3/uL (ref 0.1–1.0)
Monocytes Relative: 14 %
Neutro Abs: 4.1 10*3/uL (ref 1.7–7.7)
Neutrophils Relative %: 54 %
Platelets: 209 10*3/uL (ref 150–400)
RBC: 4.62 MIL/uL (ref 4.22–5.81)
RDW: 13.2 % (ref 11.5–15.5)
WBC: 7.4 10*3/uL (ref 4.0–10.5)
nRBC: 0 % (ref 0.0–0.2)

## 2020-01-31 LAB — GLUCOSE, CAPILLARY
Glucose-Capillary: 117 mg/dL — ABNORMAL HIGH (ref 70–99)
Glucose-Capillary: 125 mg/dL — ABNORMAL HIGH (ref 70–99)
Glucose-Capillary: 146 mg/dL — ABNORMAL HIGH (ref 70–99)
Glucose-Capillary: 151 mg/dL — ABNORMAL HIGH (ref 70–99)
Glucose-Capillary: 158 mg/dL — ABNORMAL HIGH (ref 70–99)
Glucose-Capillary: 165 mg/dL — ABNORMAL HIGH (ref 70–99)
Glucose-Capillary: 167 mg/dL — ABNORMAL HIGH (ref 70–99)

## 2020-01-31 LAB — BASIC METABOLIC PANEL
Anion gap: 7 (ref 5–15)
BUN: 11 mg/dL (ref 8–23)
CO2: 29 mmol/L (ref 22–32)
Calcium: 8.8 mg/dL — ABNORMAL LOW (ref 8.9–10.3)
Chloride: 102 mmol/L (ref 98–111)
Creatinine, Ser: 0.68 mg/dL (ref 0.61–1.24)
GFR, Estimated: >60 mL/min (ref >60)
Glucose, Bld: 164 mg/dL — ABNORMAL HIGH (ref 70–99)
Potassium: 3.6 mmol/L (ref 3.5–5.1)
Sodium: 138 mmol/L (ref 135–145)

## 2020-01-31 LAB — MAGNESIUM: Magnesium: 2 mg/dL (ref 1.7–2.4)

## 2020-01-31 MED ORDER — AMLODIPINE BESYLATE 5 MG PO TABS
5.0000 mg | ORAL_TABLET | Freq: Every day | ORAL | Status: DC
  Administered 2020-02-01: 5 mg via ORAL
  Filled 2020-01-31: qty 1

## 2020-01-31 MED ORDER — POLYETHYLENE GLYCOL 3350 17 GM/SCOOP PO POWD
17.0000 g | Freq: Every day | ORAL | Status: DC
  Filled 2020-01-31: qty 255

## 2020-01-31 MED ORDER — SENNOSIDES-DOCUSATE SODIUM 8.6-50 MG PO TABS
2.0000 | ORAL_TABLET | Freq: Two times a day (BID) | ORAL | Status: DC
  Administered 2020-01-31 – 2020-02-01 (×3): 2 via ORAL
  Filled 2020-01-31 (×3): qty 2

## 2020-01-31 MED ORDER — TAMSULOSIN HCL 0.4 MG PO CAPS
0.4000 mg | ORAL_CAPSULE | Freq: Every day | ORAL | Status: DC
  Administered 2020-01-31: 0.4 mg via ORAL
  Filled 2020-01-31: qty 1

## 2020-01-31 MED ORDER — POLYETHYLENE GLYCOL 3350 17 G PO PACK
17.0000 g | PACK | Freq: Every day | ORAL | Status: DC
  Administered 2020-01-31 – 2020-02-01 (×2): 17 g via ORAL
  Filled 2020-01-31 (×2): qty 1

## 2020-01-31 MED ORDER — APIXABAN 5 MG PO TABS
5.0000 mg | ORAL_TABLET | Freq: Two times a day (BID) | ORAL | Status: DC
  Administered 2020-01-31 – 2020-02-01 (×2): 5 mg via ORAL
  Filled 2020-01-31 (×2): qty 1

## 2020-01-31 MED ORDER — DIVALPROEX SODIUM 125 MG PO CSDR
250.0000 mg | DELAYED_RELEASE_CAPSULE | Freq: Two times a day (BID) | ORAL | Status: DC
  Administered 2020-01-31 – 2020-02-01 (×3): 250 mg via ORAL
  Filled 2020-01-31 (×2): qty 2

## 2020-01-31 MED ORDER — METOPROLOL TARTRATE 5 MG/5ML IV SOLN: 2.5000 mg | Freq: Four times a day (QID) | INTRAVENOUS | Status: DC

## 2020-01-31 NOTE — Progress Notes (Addendum)
PROGRESS NOTE  Jeffery Haynes [id number] DOB: November 14, 1931 DOA: 01/29/2020 PCP: Patient, No Pcp Per   LOS: 2 days   Brief narrative: As per HPI,  Jeffery Haynes is an 86 years old male with  large Right ACA stroke (06/2016), Alzheimer's, HTN, DM type II, seizure history  presented hospital with complaints of altered mental status from skilled nursing facility.  He was noted to have rolling of his eyes in the back of his head at the nursing home and is nonverbal. At baseline, he is wheelchair bound but able to verbalize and feel himself.  He does have intermittent episodes of confusion on the background of his dementia.  Patient had history of seizure in July 2018 and was on Vimpat which was weaned off due to sedation.    In the ED, patient had a mild leukocytosis.  Urine analysis showed glucosuria.  Ammonia was 32. CXR was clear. CT Head showed moderate generalized cerebral atrophy, old large right ACA territory infarct, and chronic small vessel disease.  No acute hemorrhage or other acute findings. Blood cultures were obtained.  He was given a dose of Zosyn and vancomycin for possible infection of unknown source given his low-grade fever, tachycardia, lactic acidosis, and intermittent tachypnea.  Patient was then admitted to hospital for work-up of seizure versus infection.  Neurology was consulted.   Assessment/Plan:  Principal Problem:   Acute metabolic encephalopathy Active Problems:   Alzheimer disease (HCC)   Atrial fibrillation with RVR (HCC)  Acute metabolic encephalopathy.  Questionable concern for seizures versus infection on admission.  Patient appeared to rigid on physical examination iniitally.  Neurology has seen the patient and Celexa and olanzapine has been discontinued for possible concern of neuroleptic malignant syndrome. Improved. CK level was 28. Chest x-ray was clear.  UA without any acute findings.  Patient received IV vancomycin and Zosyn in the ED.    EEG without  focal seizures.  Blood culture negative in 2 days..  Urine culture with multiple species.  Continue IV fluids for now.  Appears to be mildly volume depleted.  WBC 10.6.  Procalcitonin less than 0.10.  Currently n.p.o. status due to rigidity and baseline decreased mentation.  Will discontinue IV antibiotics today.  Paroxysmal atrial fibrillation with RVR.  Patient was given Cardizem bolus in the ED followed by Cardizem drip.  Subsequently, cardizem was kept on hold.   2D echocardiogram from 01/30/2020 showed left ventricle ejection fraction of 55 to 60% with no regional wall motion abnormality.  Diastolic function could not be evaluated.  Patient does have elevated CHA2DS2-VASc score.  Left atrial size was normal.  Rate controlled and on the lower side so we will hold off with the nodal blockers for now.  Spoke with the patient's wife Ms Jeffery Haynes on the phone and spoke about anticoagulation and risks involved.  She is agreeable with Eliquis at this time.  History of Alzheimer's dementia.  As per the patient's wife patient is able to carry conversation at baseline.  Mostly bedbound but able to feed by himself.  Patient is likely at baseline. Resume valproate.  Diabetes mellitus type 2.  Continue sliding scale insulin, Accu-Cheks. On  D5 normal saline.  Continue sliding scale insulin.  Latest POC glucose of 158. Will start on PO diet.  Hyponatremia.  Likely volume depletion.  Sodium of 138 today which has improved.  Bedbound status total care.  Patient is from skilled nursing facility. Will plan for SNF on discharge.  Hypomagnesemia.  Received 2 g of  magnesium yesterday.  Magnesium level of 2.0 today.   DVT prophylaxis: enoxaparin (LOVENOX) injection 40 mg Start: 01/30/20 1000   Code Status: Partial code, no intubation  Family Communication:  Spoke with the patient's wife on the phone and updated her about the clinical condition of the patient.   Status is: Inpatient  Remains inpatient appropriate  because:IV treatments appropriate due to intensity of illness or inability to take PO and Inpatient level of care appropriate due to severity of illness  Dispo: The patient is from: SNF              Anticipated d/c is to: SNF              Anticipated d/c date is: 1-2 days              Patient currently is not medically stable to d/c.  Consultants:  Neurology  Procedures:  EEG  Antibiotics:  . Vancomycin and Zosyn IV 11/16>  Anti-infectives (From admission, onward)   Start     Dose/Rate Route Frequency Ordered Stop   01/30/20 0230  vancomycin (VANCOCIN) IVPB 1000 mg/200 mL premix        1,000 mg 200 mL/hr over 60 Minutes Intravenous Every 12 hours 01/29/20 1358     01/29/20 2000  piperacillin-tazobactam (ZOSYN) IVPB 3.375 g        3.375 g 12.5 mL/hr over 240 Minutes Intravenous Every 8 hours 01/29/20 1358     01/29/20 1315  piperacillin-tazobactam (ZOSYN) IVPB 3.375 g        3.375 g 100 mL/hr over 30 Minutes Intravenous  Once 01/29/20 1306 01/29/20 1358   01/29/20 1315  vancomycin (VANCOREADY) IVPB 1750 mg/350 mL        1,750 mg 175 mL/hr over 120 Minutes Intravenous  Once 01/29/20 1306 01/29/20 2142     Subjective: Today, patient was seen and examined at bedside. Patient more alert and awake and communicative today.  States that he feels little hungry today.   Objective: Vitals:   01/31/20 0442 01/31/20 0815  BP: (!) 141/80 131/60  Pulse: 72 66  Resp: 18 18  Temp: 98.1 F (36.7 C) 99.1 F (37.3 C)  SpO2: 100% 100%    Intake/Output Summary (Last 24 hours) at 01/31/2020 0854 Last data filed at 01/30/2020 2300 Gross per 24 hour  Intake 229.44 ml  Output 600 ml  Net -370.56 ml   Filed Weights   01/31/20 0442  Weight: 69.2 kg   Body mass index is 20.69 kg/m.   Physical Exam: GENERAL: Patient is responsive and communicative, rigidity has improved.   HENT: No scleral pallor or icterus. Pupils equally reactive to light. Oral mucosa is moist NECK: is supple,  no gross swelling noted. CHEST: Clear to auscultation. No crackles or wheezes.  Diminished breath sounds bilaterally. CVS: S1 and S2 heard, no murmur.  Irregular rhythm. ABDOMEN: Soft, non-tender, bowel sounds are present. EXTREMITIES: No edema.  Improved rigidity. CNS: Grossly nonfocal, alert awake and communicative.  Moving extremities. SKIN: warm and dry without rashes.    Data Review: I have personally reviewed the following laboratory data and studies,  CBC: Recent Labs  Lab 01/29/20 1224 01/30/20 0224 01/31/20 0353  WBC 13.7* 10.6* 7.4  NEUTROABS 11.8* 7.5 4.1  HGB 14.8 14.5 13.5  HCT 47.0 45.8 42.9  MCV 92.0 92.3 92.9  PLT 247 270 209   Basic Metabolic Panel: Recent Labs  Lab 01/29/20 1224 01/30/20 0224 01/31/20 0353  NA 137 133* 138  K  5.2* 3.7 3.6  CL 101 97* 102  CO2 24 28 29   GLUCOSE 201* 125* 164*  BUN 10 7* 11  CREATININE 0.64 0.75 0.68  CALCIUM 9.1 9.0 8.8*  MG  --  1.4* 2.0   Liver Function Tests: Recent Labs  Lab 01/29/20 1224  AST 27  ALT 10  ALKPHOS 74  BILITOT 0.8  PROT 6.3*  ALBUMIN 3.6   No results for input(s): LIPASE, AMYLASE in the last 168 hours. Recent Labs  Lab 01/29/20 1331  AMMONIA 32   Cardiac Enzymes: Recent Labs  Lab 01/29/20 1531  CKTOTAL 28*   BNP (last 3 results) No results for input(s): BNP in the last 8760 hours.  ProBNP (last 3 results) No results for input(s): PROBNP in the last 8760 hours.  CBG: Recent Labs  Lab 01/30/20 1558 01/30/20 1945 01/31/20 0014 01/31/20 0439 01/31/20 0750  GLUCAP 120* 153* 117* 146* 158*   Recent Results (from the past 240 hour(s))  Blood Culture (routine x 2)     Status: None (Preliminary result)   Collection Time: 01/29/20  1:29 PM   Specimen: BLOOD  Result Value Ref Range Status   Specimen Description BLOOD SITE NOT SPECIFIED  Final   Special Requests   Final    BOTTLES DRAWN AEROBIC AND ANAEROBIC Blood Culture adequate volume   Culture   Final    NO GROWTH < 24  HOURS Performed at [facility], [address], [place], [place]    Report Status PENDING  Incomplete  Blood Culture (routine x 2)     Status: None (Preliminary result)   Collection Time: 01/29/20  1:30 PM   Specimen: BLOOD  Result Value Ref Range Status   Specimen Description BLOOD SITE NOT SPECIFIED  Final   Special Requests   Final    BOTTLES DRAWN AEROBIC AND ANAEROBIC Blood Culture results may not be optimal due to an inadequate volume of blood received in culture bottles   Culture   Final    NO GROWTH < 24 HOURS Performed at [facility], [address] [place], [place]    Report Status PENDING  Incomplete  Urine culture     Status: Abnormal   Collection Time: 01/29/20  3:06 PM   Specimen: In/Out Cath Urine  Result Value Ref Range Status   Specimen Description IN/OUT CATH URINE  Final   Special Requests   Final    NONE Performed at [facility], [address] [place], [place]    Culture MULTIPLE SPECIES PRESENT, SUGGEST RECOLLECTION (A)  Final   Report Status 01/30/2020 FINAL  Final  Respiratory Panel by RT PCR (Flu A&B, Covid) - Nasopharyngeal Swab     Status: None   Collection Time: 01/30/20  7:49 PM   Specimen: Nasopharyngeal Swab  Result Value Ref Range Status   SARS Coronavirus 2 by RT PCR NEGATIVE NEGATIVE Final    Comment: (NOTE) SARS-CoV-2 target nucleic acids are NOT DETECTED.  The SARS-CoV-2 RNA is generally detectable in upper respiratoy specimens during the acute phase of infection. The lowest concentration of SARS-CoV-2 viral copies this assay can detect is 131 copies/mL. A negative result does not preclude SARS-Cov-2 infection and should not be used as the sole basis for treatment or other patient management decisions. A negative result may occur with  improper specimen collection/handling, submission of specimen other than nasopharyngeal swab, presence of viral mutation(s) within the areas targeted by  this assay,  and inadequate number of viral copies (<131 copies/mL). A negative result must be combined with clinical observations, patient history, and epidemiological information. The expected result is Negative.  Fact Sheet for Patients:  https://www.moore.com/  Fact Sheet for Healthcare Providers:  https://www.young.biz/  This test is no t yet approved or cleared by the [place] FDA and  has been authorized for detection and/or diagnosis of SARS-CoV-2 by FDA under an Emergency Use Authorization (EUA). This EUA will remain  in effect (meaning this test can be used) for the duration of the COVID-19 declaration under Section 564(b)(1) of the Act, 21 U.S.C. section 360bbb-3(b)(1), unless the authorization is terminated or revoked sooner.     Influenza A by PCR NEGATIVE NEGATIVE Final   Influenza B by PCR NEGATIVE NEGATIVE Final    Comment: (NOTE) The Xpert Xpress SARS-CoV-2/FLU/RSV assay is intended as an aid in  the diagnosis of influenza from Nasopharyngeal swab specimens and  should not be used as a sole basis for treatment. Nasal washings and  aspirates are unacceptable for Xpert Xpress SARS-CoV-2/FLU/RSV  testing.  Fact Sheet for Patients: https://www.moore.com/  Fact Sheet for Healthcare Providers: https://www.young.biz/  This test is not yet approved or cleared by the [place] FDA and  has been authorized for detection and/or diagnosis of SARS-CoV-2 by  FDA under an Emergency Use Authorization (EUA). This EUA will remain  in effect (meaning this test can be used) for the duration of the  Covid-19 declaration under Section 564(b)(1) of the Act, 21  U.S.C. section 360bbb-3(b)(1), unless the authorization is  terminated or revoked. Performed at [facility], [address] [place], [place] [address]   MRSA PCR Screening     Status: None   Collection Time: 01/30/20  7:49 PM    Specimen: Nasopharyngeal Swab  Result Value Ref Range Status   MRSA by PCR NEGATIVE NEGATIVE Final    Comment:        The GeneXpert MRSA Assay (FDA approved for NASAL specimens only), is one component of a comprehensive MRSA colonization surveillance program. It is not intended to diagnose MRSA infection nor to guide or monitor treatment for MRSA infections. Performed at [facility], [address], [place], [place] [address]      Studies: EEG  Result Date: 01/29/2020 Charlsie Quest, MD     01/29/2020  5:13 PM Patient Name: Phong Isenberg MRN: [id number] Epilepsy Attending: Charlsie Quest Referring Physician/Provider: Dr. Caryl Pina Date: 01/29/2020 Duration: 25.23 mins Patient history: 86 year old male with history of Alzheimer's disease presented with altered mental status.  In the ED patient was noted to have an episode of eye rolling concerning for seizure.  EEG to evaluate for seizure. Level of alertness: Asleep/ lethargic AEDs during EEG study: None Technical aspects: This EEG study was done with scalp electrodes positioned according to the 10-20 International system of electrode placement. Electrical activity was acquired at a sampling rate of  and reviewed with a high frequency filter of  and a low frequency filter of . EEG data were recorded continuously and digitally stored. Description:  Sleep was characterized by vertex waves, sleep spindles (12 to 14 Hz), maximal frontocentral region.  EEG showed continuous generalized ad lateralized right hemisphere 3 to 6 Hz theta-delta slowing. Single sharp transient was seen in right temporal region. Hyperventilation and photic stimulation were not performed.   ABNORMALITY -Continuous slow, generalized and lateralized right hemisphere IMPRESSION: This study is suggestive of cortical dysfunction in right hemisphere which could  be secondary to underlying stroke, post-ictal state. There is also moderate to severe  diffuse encephalopathy, nonspecific etiology. No seizures or definite epileptiform discharges were seen throughout the recording Charlsie Quest   CT Head Wo Contrast  Result Date: 01/29/2020 CLINICAL DATA:  Delirium. Additional history provided: Altered mental status, possible seizure. EXAM: CT HEAD WITHOUT CONTRAST TECHNIQUE: Contiguous axial images were obtained from the base of the skull through the vertex without intravenous contrast. COMPARISON:  Head CT 09/28/2019. FINDINGS: Brain: Moderate generalized cerebral atrophy. Redemonstrated large chronic right ACA territory infarct. Background mild ill-defined hypoattenuation within the cerebral white matter is nonspecific, but compatible with chronic small vessel ischemic disease. There is no acute intracranial hemorrhage. No acute demarcated cortical infarct is identified. No extra-axial fluid collection. No evidence of intracranial mass. No midline shift. Vascular: No hyperdense vessel.  Atherosclerotic calcifications. Skull: Normal. Negative for fracture or focal lesion. Sinuses/Orbits: Visualized orbits show no acute finding. Mild ethmoid sinus mucosal thickening. Small right maxillary sinus mucous retention cyst. Trace fluid within the bilateral mastoid air cells. IMPRESSION: No evidence of acute intracranial abnormality. Redemonstrated large chronic right ACA territory infarct. Stable background moderate cerebral atrophy and mild chronic small vessel ischemic disease. Mild paranasal sinus disease as described. Trace bilateral mastoid effusions. Electronically Signed   By: Jackey Loge DO   On: 01/29/2020 14:59   DG CHEST PORT 1 VIEW  Result Date: 01/29/2020 CLINICAL DATA:  Acute hypoxic respiratory failure. EXAM: PORTABLE CHEST 1 VIEW COMPARISON:  Earlier film, same date. FINDINGS: The cardiac silhouette, mediastinal and hilar contours are stable. Low lung volumes with vascular crowding and streaky basilar atelectasis. No definite infiltrates or  effusions. No pneumothorax. IMPRESSION: Low lung volumes with vascular crowding and streaky basilar atelectasis. Electronically Signed   By: Rudie Meyer M.D.   On: 01/29/2020 18:34   DG Chest Port 1 View  Result Date: 01/29/2020 CLINICAL DATA:  86 year old male with possible sepsis. Possible seizure activity at 0730 hours. EXAM: PORTABLE CHEST 1 VIEW COMPARISON:  Portable chest 09/28/2019 and earlier. FINDINGS: Portable AP semi upright view at 1247 hours. Improved lung volumes. Mediastinal contours are stable with mild tortuosity of the aorta. Visualized tracheal air column is within normal limits. Allowing for portable technique the lungs are clear. Negative visible bowel gas pattern. No acute osseous abnormality identified. IMPRESSION: No acute cardiopulmonary abnormality. Electronically Signed   By: Odessa Fleming M.D.   On: 01/29/2020 12:56   ECHOCARDIOGRAM COMPLETE  Result Date: 01/30/2020    ECHOCARDIOGRAM REPORT   Patient Name:   KONA LOVER Date of Exam: 01/30/2020 Medical Rec #:  [id number]        Height:       72.0 in Accession #:    [id number]       Weight:       162.0 lb Date of Birth:  March 14, 1932        BSA:          1.948 m Patient Age:    88 years         BP:           128/65 mmHg Patient Gender: M                HR:           60 bpm. Exam Location:  Inpatient Procedure: 2D Echo, Cardiac Doppler, Color Doppler and Intracardiac            Opacification Agent Indications:    Atrial fibrillation  History:        Patient has no prior history of Echocardiogram examinations.                 Risk Factors:Hypertension and Diabetes. H/O stroke.  Sonographer:    Arthur Guy RRoss LudwigCS (AE) Referring Phys: [phone] DAVID GIRGUIS IMPRESSIONS  1. Left ventricular ejection fraction, by estimation, is 55 to 60%. The left ventricle has normal function. The left ventricle has no regional wall motion abnormalities. There is moderate concentric left ventricular hypertrophy. Left ventricular diastolic function could not be  evaluated.  2. Right ventricular systolic function is normal. The right ventricular size is normal. Mildly increased right ventricular wall thickness.  3. The mitral valve is normal in structure. No evidence of mitral valve regurgitation.  4. The aortic valve is grossly normal. Aortic valve regurgitation is not visualized.  5. There is borderline dilatation of the ascending aorta, measuring 39 mm. Comparison(s): No prior Echocardiogram. FINDINGS  Left Ventricle: Suboptimal gradients. Left ventricular ejection fraction, by estimation, is 55 to 60%. The left ventricle has normal function. The left ventricle has no regional wall motion abnormalities. Definity contrast agent was given IV to delineate the left ventricular endocardial borders. The left ventricular internal cavity size was normal in size. There is moderate concentric left ventricular hypertrophy. Left ventricular diastolic function could not be evaluated. Right Ventricle: The right ventricular size is normal. Mildly increased right ventricular wall thickness. Right ventricular systolic function is normal. Left Atrium: Left atrial size was normal in size. Right Atrium: Right atrial size was normal in size. Pericardium: There is no evidence of pericardial effusion. Mitral Valve: The mitral valve is normal in structure. No evidence of mitral valve regurgitation. Tricuspid Valve: The tricuspid valve is grossly normal. Tricuspid valve regurgitation is not demonstrated. Aortic Valve: The aortic valve is grossly normal. Aortic valve regurgitation is not visualized. Pulmonic Valve: The pulmonic valve was normal in structure. Pulmonic valve regurgitation is not visualized. Aorta: The aortic root is normal in size and structure. There is borderline dilatation of the ascending aorta, measuring 39 mm. Venous: The pulmonary veins were not well visualized. IAS/Shunts: The atrial septum is grossly normal.  LEFT VENTRICLE PLAX 2D LVIDd:         5.00 cm LVIDs:         3.50  cm LV PW:         1.30 cm LV IVS:        1.30 cm LVOT diam:     2.20 cm LV SV:         46 LV SV Index:   24 LVOT Area:     3.80 cm  RIGHT VENTRICLE RV Basal diam:  3.50 cm RV Mid diam:    3.00 cm RV S prime:     13.70 cm/s TAPSE (M-mode): 1.8 cm LEFT ATRIUM             Index       RIGHT ATRIUM           Index LA diam:        2.80 cm 1.44 cm/m  RA Area:     16.50 cm LA Vol (A2C):   31.8 ml 16.32 ml/m RA Volume:   42.60 ml  21.87 ml/m LA Vol (A4C):   39.3 ml 20.17 ml/m LA Biplane Vol: 36.1 ml 18.53 ml/m  AORTIC VALVE LVOT Vmax:   67.90 cm/s LVOT Vmean:  42.733 cm/s LVOT VTI:    0.121 m  AORTA Ao Root  diam: 3.40 cm Ao Asc diam:  3.90 cm  SHUNTS Systemic VTI:  0.12 m Systemic Diam: 2.20 cm Riley Lam MD Electronically signed by Riley Lam MD Signature Date/Time: 01/30/2020/2:26:14 PM    Final       Joycelyn Das, MD  [facility] 01/31/2020

## 2020-01-31 NOTE — Progress Notes (Addendum)
ANTICOAGULATION CONSULT NOTE   Pharmacy Consult for Eliquis Indication: atrial fibrillation  No Known Allergies  Patient Measurements: Height: 6' (182.9 cm) Weight: 69.2 kg (152 lb 8.9 oz) IBW/kg (Calculated) : 77.6   Vital Signs: Temp: 98.1 F (36.7 C) (11/18 1133) Temp Source: Oral (11/18 1133) BP: 107/56 (11/18 1133) Pulse Rate: 61 (11/18 1133)  Labs: Recent Labs    01/29/20 1224 01/29/20 1224 01/29/20 1331 01/29/20 1531 01/30/20 0224 01/31/20 0353  HGB 14.8   < >  --   --  14.5 13.5  HCT 47.0  --   --   --  45.8 42.9  PLT 247  --   --   --  270 209  APTT  --   --  31  --   --   --   LABPROT  --   --  13.2  --   --   --   INR  --   --  1.0  --   --   --   CREATININE 0.64  --   --   --  0.75 0.68  CKTOTAL  --   --   --  28*  --   --    < > = values in this interval not displayed.    Estimated Creatinine Clearance: 64.9 mL/min (by C-G formula based on SCr of 0.68 mg/dL).  Assessment:  To begin Eliquis for atrial fibrillation. Hx CVA (2018) and was on Plavix PTA.   86 yrs old, weight > 60 kg, creatinine < 1.5, so qualifies for full-dose.     Has been on Lovenox 40 mg SQ q24hr for VTE prophylaxis.  Last dose given ~ 9am today.   Goal of Therapy:  appropriate Eliquis dose for indication Monitor platelets by anticoagulation protocol: Yes   Plan:  Eliquis 5 mg PO BID. Begin tonight since Lovenox given this am.  Dennie Fetters, RPh Phone: [phone] 01/31/2020,12:29 PM

## 2020-01-31 NOTE — Progress Notes (Signed)
Neurology Progress Note  S: No complaints from pt. Per RN, he has "woken up" and there has been no seizure activity noted by staff or reported from previous shift.   O: Current vital signs: BP (!) 107/56 (BP Location: Right Arm)   Pulse 61   Temp 98.1 F (36.7 C) (Oral)   Resp 18   Ht 6' (1.829 m)   Wt 69.2 kg   SpO2 98%   BMI 20.69 kg/m  Vital signs in last 24 hours: Temp:  [97.4 F (36.3 C)-99.1 F (37.3 C)] 98.1 F (36.7 C) (11/18 1133) Pulse Rate:  [56-73] 61 (11/18 1133) Resp:  [16-19] 18 (11/18 1133) BP: (107-144)/(56-80) 107/56 (11/18 1133) SpO2:  [97 %-100 %] 98 % (11/18 1133) Weight:  [69.2 kg] 69.2 kg (11/18 0442)  GENERAL: Awake, alert in NAD HEENT: Normocephalic and atraumatic, dry mm LUNGS: Normal respiratory effort.  CV: RRR ABDOMEN: Soft, nontender  NEURO:  Mentatation: AA&O to self, wife's name, month, Nov and Dec holidays, but not place, day, date, or year.  Language: speech is intact, appropriate. No aphasia or dysarthria noted. Naming and fluency intact. Comprehension-follows most commands.  Cranial Nerves:  II-PERRL 45mm. Blinks to threat. III, IV, VI-He lacks comprehension to follow commands for EOMI exam or visual fields. Eyes midline. No forced gaze deviation. V-Moves jaw side to side. Sensation intact to light touch and symmetrical to face. Cold sensation symmetrical.  VII-Smile is symmetrical. Furrows brow. Puffs cheeks. Tongue midline. VIII-Hearing intact to voice and finger rubbing.  IX- Palate rises.  XI-shrug shoulder strength 5/5.  XII-tongue is midline.  Motor-strength is 5/5 throughout. Tone is normal. Sensory-intact to light touch and cold temp.  DTRs- 2+ UE 1+ LEs Babinski-toes are downgoing.  Cerebellar-comprehension lacking to follow commands. Gait-deferred.   Medications  Current Facility-Administered Medications:  .  0.9 %  sodium chloride infusion, , Intravenous, PRN, Pokhrel, Laxman, MD, Last Rate: 10 mL/hr at 01/30/20  1516, 250 mL at 01/30/20 1516 .  acetaminophen (TYLENOL) suppository 650 mg, 650 mg, Rectal, Q4H PRN, Lewie Chamber, MD, 650 mg at 01/29/20 2059 .  dextrose 5 %-0.9 % sodium chloride infusion, , Intravenous, Continuous, Pokhrel, Laxman, MD, Last Rate: 100 mL/hr at 01/31/20 0305, New Bag at 01/31/20 0305 .  diltiazem (CARDIZEM) 125 mg in dextrose 5% 125 mL (1 mg/mL) infusion, 5-15 mg/hr, Intravenous, Titrated, Lewie Chamber, MD, Stopped at 01/30/20 [phone] .  enoxaparin (LOVENOX) injection 40 mg, 40 mg, Subcutaneous, Daily, Girguis, David, MD, 40 mg at 01/31/20 0911 .  hydrALAZINE (APRESOLINE) injection 10 mg, 10 mg, Intravenous, Q4H PRN, Lewie Chamber, MD .  insulin aspart (novoLOG) injection 0-9 Units, 0-9 Units, Subcutaneous, Q4H, Lewie Chamber, MD, 1 Units at 01/31/20 0910 .  metoprolol tartrate (LOPRESSOR) injection 2.5 mg, 2.5 mg, Intravenous, Q6H, Pokhrel, Laxman, MD .  piperacillin-tazobactam (ZOSYN) IVPB 3.375 g, 3.375 g, Intravenous, Q8H, Lewie Chamber, MD, Last Rate: 12.5 mL/hr at 01/31/20 0439, 3.375 g at 01/31/20 0439 .  vancomycin (VANCOCIN) IVPB 1000 mg/200 mL premix, 1,000 mg, Intravenous, Q12H, Lewie Chamber, MD, Last Rate: 200 mL/hr at 01/31/20 0304, 1,000 mg at 01/31/20 0304 Labs CBC    Component Value Date/Time   WBC 7.4 01/31/2020 0353   RBC 4.62 01/31/2020 0353   HGB 13.5 01/31/2020 0353   HCT 42.9 01/31/2020 0353   PLT 209 01/31/2020 0353   MCV 92.9 01/31/2020 0353   MCH 29.2 01/31/2020 0353   MCHC 31.5 01/31/2020 0353   RDW 13.2 01/31/2020 0353   LYMPHSABS 2.1  01/31/2020 0353   MONOABS 1.0 01/31/2020 0353   EOSABS 0.0 01/31/2020 0353   BASOSABS 0.1 01/31/2020 0353    CMP     Component Value Date/Time   NA 138 01/31/2020 0353   K 3.6 01/31/2020 0353   CL 102 01/31/2020 0353   CO2 29 01/31/2020 0353   GLUCOSE 164 (H) 01/31/2020 0353   BUN 11 01/31/2020 0353   CREATININE 0.68 01/31/2020 0353   CALCIUM 8.8 (L) 01/31/2020 0353   PROT 6.3 (L) 01/29/2020  1224   ALBUMIN 3.6 01/29/2020 1224   AST 27 01/29/2020 1224   ALT 10 01/29/2020 1224   ALKPHOS 74 01/29/2020 1224   BILITOT 0.8 01/29/2020 1224   GFRNONAA >60 01/31/2020 0353   GFRAA >60 09/28/2019 1346    Imaging I have reviewed images in epic and the results pertinent to this consultation are: MRI is consistent with known old, large right ACA infarct in 2018. Stable background moderate cerebral atrophy and mild chronic small vessel ischemic disease. No acute abnormalities.   EEG: Cortical dysfunction of right hemisphere sugtestive of post-ictal state vs. Underlying stroke. Moderate to severe diffuse encephalopathy. No seizure or definite epileptiform discharge noted.   Assessment: 86 yo male with a hx of dementia who was brought to the ED via EMS on 01/19/20 due to possible seizure activity at SNF. Per wife, no hx of seizure. Neuro was asked to consult on arrival. On that day, pt was somulent and not communicating. He did not follow commands. He did not respond to name. 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 He has also been tx'd for infection and Afib by internists.   Impression: Acute Metabolic Encephalopathy-No new evidence of intracranial infarct. EEG not consistent with seizure activity/epilepsy.  Recommendations:  - Continue home dose of Plavix and Depakote.  - Continue off Olanzapine - Can restart Celexa.  - Consider a trial of Aricept for his dementia - May need to be started on anticoagulation if telemetry confirms atrial fibrillation. However, risks may outweigh benefits if the patient has a tendency to fall. If started on anticoagulation, then stop Plavix unless there is an indication for this medication such as a cardiac stent.  - Neurohospitalist service will sign off. Please call with additional questions.    Jimmye Norman, MSN, APN-BC/Nurse  Practitioner Pager: [phone]  Electronically signed: Dr. Caryl Pina

## 2020-02-01 DIAGNOSIS — I4891 Unspecified atrial fibrillation: Secondary | ICD-10-CM | POA: Diagnosis not present

## 2020-02-01 DIAGNOSIS — F0280 Dementia in other diseases classified elsewhere without behavioral disturbance: Secondary | ICD-10-CM | POA: Diagnosis not present

## 2020-02-01 DIAGNOSIS — G309 Alzheimer's disease, unspecified: Secondary | ICD-10-CM | POA: Diagnosis not present

## 2020-02-01 LAB — CBC WITH DIFFERENTIAL/PLATELET
Abs Immature Granulocytes: 0.05 10*3/uL (ref 0.00–0.07)
Basophils Absolute: 0 10*3/uL (ref 0.0–0.1)
Basophils Relative: 0 %
Eosinophils Absolute: 0 10*3/uL (ref 0.0–0.5)
Eosinophils Relative: 1 %
HCT: 38.1 % — ABNORMAL LOW (ref 39.0–52.0)
Hemoglobin: 12 g/dL — ABNORMAL LOW (ref 13.0–17.0)
Immature Granulocytes: 1 %
Lymphocytes Relative: 21 %
Lymphs Abs: 1.8 10*3/uL (ref 0.7–4.0)
MCH: 29.1 pg (ref 26.0–34.0)
MCHC: 31.5 g/dL (ref 30.0–36.0)
MCV: 92.3 fL (ref 80.0–100.0)
Monocytes Absolute: 1.1 10*3/uL — ABNORMAL HIGH (ref 0.1–1.0)
Monocytes Relative: 13 %
Neutro Abs: 5.6 10*3/uL (ref 1.7–7.7)
Neutrophils Relative %: 64 %
Platelets: 199 10*3/uL (ref 150–400)
RBC: 4.13 MIL/uL — ABNORMAL LOW (ref 4.22–5.81)
RDW: 13.1 % (ref 11.5–15.5)
WBC: 8.6 10*3/uL (ref 4.0–10.5)
nRBC: 0 % (ref 0.0–0.2)

## 2020-02-01 LAB — BASIC METABOLIC PANEL
Anion gap: 7 (ref 5–15)
BUN: 15 mg/dL (ref 8–23)
CO2: 27 mmol/L (ref 22–32)
Calcium: 8.4 mg/dL — ABNORMAL LOW (ref 8.9–10.3)
Chloride: 106 mmol/L (ref 98–111)
Creatinine, Ser: 1.43 mg/dL — ABNORMAL HIGH (ref 0.61–1.24)
GFR, Estimated: 48 mL/min — ABNORMAL LOW (ref >60)
Glucose, Bld: 148 mg/dL — ABNORMAL HIGH (ref 70–99)
Potassium: 3 mmol/L — ABNORMAL LOW (ref 3.5–5.1)
Sodium: 140 mmol/L (ref 135–145)

## 2020-02-01 LAB — GLUCOSE, CAPILLARY
Glucose-Capillary: 132 mg/dL — ABNORMAL HIGH (ref 70–99)
Glucose-Capillary: 133 mg/dL — ABNORMAL HIGH (ref 70–99)
Glucose-Capillary: 142 mg/dL — ABNORMAL HIGH (ref 70–99)
Glucose-Capillary: 183 mg/dL — ABNORMAL HIGH (ref 70–99)
Glucose-Capillary: 191 mg/dL — ABNORMAL HIGH (ref 70–99)
Glucose-Capillary: 202 mg/dL — ABNORMAL HIGH (ref 70–99)

## 2020-02-01 LAB — MAGNESIUM: Magnesium: 1.8 mg/dL (ref 1.7–2.4)

## 2020-02-01 MED ORDER — POTASSIUM CHLORIDE 10 MEQ/100ML IV SOLN
10.0000 meq | INTRAVENOUS | Status: AC
  Administered 2020-02-01 (×3): 10 meq via INTRAVENOUS
  Filled 2020-02-01 (×3): qty 100

## 2020-02-01 MED ORDER — POTASSIUM CHLORIDE CRYS ER 20 MEQ PO TBCR
40.0000 meq | EXTENDED_RELEASE_TABLET | Freq: Once | ORAL | Status: AC
  Administered 2020-02-01: 40 meq via ORAL
  Filled 2020-02-01: qty 2

## 2020-02-01 MED ORDER — APIXABAN 5 MG PO TABS: 5.0000 mg | ORAL_TABLET | Freq: Two times a day (BID) | ORAL | Status: DC

## 2020-02-01 MED ORDER — DONEPEZIL HCL 5 MG PO TABS: 5.0000 mg | ORAL_TABLET | Freq: Every day | ORAL | Status: AC

## 2020-02-01 MED ORDER — POTASSIUM CHLORIDE ER 10 MEQ PO TBCR: 10.0000 meq | EXTENDED_RELEASE_TABLET | Freq: Every day | ORAL | 0 refills | Status: DC

## 2020-02-01 NOTE — Discharge Instructions (Signed)

## 2020-02-01 NOTE — Progress Notes (Signed)
Patient pulled IV from RFA during 3rd potassium infusion.  He became combative while attempting to dress most recent IV site and spit at caregivers.  Calmed patient and covered site to RFA without incident.  Provider made aware.  Will follow.

## 2020-02-01 NOTE — Progress Notes (Signed)
Patient self removed IV to R wrist.  Site cleaned and covered without incident.  Restart to LFA with 20G, blood return noted and flushed.  IV Potassium restarted.

## 2020-02-01 NOTE — Discharge Summary (Signed)
Physician Discharge Summary  Jeffery Haynes KZS:[id number] DOB: 08-12-1931 DOA: 01/29/2020  PCP: Patient, No Pcp Per  Admit date: 01/29/2020 Discharge date: 02/01/2020  Admitted From: SNF  Discharge disposition: SNF  Recommendations for Outpatient Follow-Up:   . Follow up with your primary care provider in 3 to 5 days . Check CBC, BMP, magnesium in the next visit, she had low potassium and will need to be checked in few days..  Potassium has been prescribed . Please do not use olanzapine or antipsychotics for concerns of neuroleptic malignant syndrome.  Continue Celexa.  Trial of Aricept has been initiated in the hospital . Patient has been initiated on Eliquis for anticoagulation.  Please take precautions. . Plavix has been discontinued since the patient has been started on anticoagulation.   Discharge Diagnosis:   Principal Problem:   Acute metabolic encephalopathy Active Problems:   Alzheimer disease (HCC)   Atrial fibrillation with RVR (HCC)   Discharge Condition: Improved.  Diet recommendation: Low sodium, heart healthy.  Soft diet.  Wound care: None.  Code status: Partial code   History of Present Illness:   Mr. 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  He was noted to have rolling of his eyes in the back of his head at the nursing home and is nonverbal. At baseline, he is wheelchair bound but able to verbalize and feel himself.  He does have intermittent episodes of confusion on the background of his dementia.  Patient had history of seizure in July 2018 and was on Vimpat which was weaned off due to sedation.    In the ED, patient had a mild leukocytosis.  Urine analysis showed glucosuria.  Ammonia was 32. CXR was clear. CT Head showed moderate generalized cerebral atrophy, old large right  ACA territory infarct, and chronic small vessel disease. No acute hemorrhage or other acute findings. Blood cultures were obtained. He was given a dose of Zosyn and vancomycin for possible infection of unknown source given his low-grade fever, tachycardia, lactic acidosis, and intermittent tachypnea.  Patient was then admitted to hospital for work-up of seizure versus infection. Neurology was consulted.   Hospital Course:   Following conditions were addressed during hospitalization as listed below,  Acute metabolic encephalopathy.  Questionable concern for seizures versus infection on admission.  Patient appeared to rigid on physical examination iniitally.  Neurology was consulted and was taken off Celexa and olanzapine  for possible concern of neuroleptic malignant syndrome.  His mental status and rigidity improved after cessation of antipsychotics.   CK level was 28. Chest x-ray was clear.  UA without any acute findings.  Patient received IV vancomycin and Zosyn in the ED.    he did not have signs of infection so antibiotics were discontinued. EEG without focal seizures.  Blood culture was negative and was discharged.  Urine culture with multiple species.    Patient also received IV fluids during hospitalization and volume depletion.  Paroxysmal atrial fibrillation with RVR.    New findings during hospitalization.  He was subsequently bradycardic after Cardizem bolus.  Nodal blockers have not been prescribed at this time.  However patient has high CHA2DS2-VASc score and will be continued on Eliquis for anticoagulation on discharge.  This has been communicated with the patient's wife prior to discharge.    History of Alzheimer's dementia.  As per  the patient's wife, patient is able to carry conversation at baseline.  Mostly bedbound but able to feed by himself.    Currently at baseline. Resume valproate.  Hypokalemia   Potassium of 3.0.  Receiving 40 mEq IV of potassium today.  Will be discharged  on 10 mEq potassium daily for next 2 weeks.  Please check BMP in the next visit.  Diabetes mellitus type 2.    Resume Metformin on discharge.  Hyponatremia.  Likely volume depletion.  Sodium levels have improved at this time.  Sodium level of 140 today.  Mild elevated creatinine.  Monitor as outpatient.  Hypomagnesemia.    Improved after replacement.  Latest magnesium 1.8.  Bedbound status total care.  Patient is from skilled nursing facility.   Plan for discharge back to SNF  Disposition.  At this time, patient is stable for disposition back to skilled nursing facility I had a long conversation with the patient's wife regarding disposition yesterday.  Medical Consultants:    Neurology  Procedures:    EEG Subjective:   Today, patient was seen and examined at bedside.  Appears to be at baseline mentation.  Alert awake and communicative.  Had his breakfast this morning.  Discharge Exam:   Vitals:   01/31/20 2013 02/01/20 0354  BP: (!) 117/55 122/62  Pulse: (!) 57 63  Resp: 18 18  Temp: 98.2 F (36.8 C) 98 F (36.7 C)  SpO2: 96% 98%   Vitals:   01/31/20 0815 01/31/20 1133 01/31/20 2013 02/01/20 0354  BP: 131/60 (!) 107/56 (!) 117/55 122/62  Pulse: 66 61 (!) 57 63  Resp: 18 18 18 18   Temp: 99.1 F (37.3 C) 98.1 F (36.7 C) 98.2 F (36.8 C) 98 F (36.7 C)  TempSrc: Oral Oral Oral Oral  SpO2: 100% 98% 96% 98%  Weight:    69.1 kg  Height:       General: Alert awake, not in obvious distress, communicative, at his baseline mentation with underlying dementia. HENT: pupils equally reacting to light,  No scleral pallor or icterus noted. Oral mucosa is moist.  Chest:  Clear breath sounds.  Diminished breath sounds bilaterally. No crackles or wheezes.  CVS: S1 &S2 heard. No murmur.  Regular rate and rhythm. Abdomen: Soft, nontender, nondistended.  Bowel sounds are heard.   Extremities: No cyanosis, clubbing or edema.  Peripheral pulses are palpable. Psych: Alert,  awake and mildly communicative, underlying dementia. CNS:  No cranial nerve deficits.  Moving all extremities.  No rigidity noted. Skin: Warm and dry.  No rashes noted.  The results of significant diagnostics from this hospitalization (including imaging, microbiology, ancillary and laboratory) are listed below for reference.     Diagnostic Studies:   EEG  Result Date: 01/29/2020 01/31/2020, MD     01/29/2020  5:13 PM Patient Name: Yoan Sallade MRN: [facility] Attending: [id number] Referring Physician/Provider: Dr. Charlsie Quest Date: 01/29/2020 Duration: 25.23 mins Patient history: 86 year old male with history of Alzheimer's disease presented with altered mental status.  In the ED patient was noted to have an episode of eye rolling concerning for seizure.  EEG to evaluate for seizure. Level of alertness: Asleep/ lethargic AEDs during EEG study: None Technical aspects: This EEG study was done with scalp electrodes positioned according to the 10-20 International system of electrode placement. Electrical activity was acquired at a sampling rate of 500Hz  and reviewed with a high frequency filter of 70Hz  and a low frequency filter of 1Hz . EEG data were  recorded continuously and digitally stored. Description:  Sleep was characterized by vertex waves, sleep spindles (12 to 14 Hz), maximal frontocentral region.  EEG showed continuous generalized ad lateralized right hemisphere 3 to 6 Hz theta-delta slowing. Single sharp transient was seen in right temporal region. Hyperventilation and photic stimulation were not performed.   ABNORMALITY -Continuous slow, generalized and lateralized right hemisphere IMPRESSION: This study is suggestive of cortical dysfunction in right hemisphere which could be secondary to underlying stroke, post-ictal state. There is also moderate to severe diffuse encephalopathy, nonspecific etiology. No seizures or definite epileptiform discharges were seen  throughout the recording Charlsie Quest   CT Head Wo Contrast  Result Date: 01/29/2020 CLINICAL DATA:  Delirium. Additional history provided: Altered mental status, possible seizure. EXAM: CT HEAD WITHOUT CONTRAST TECHNIQUE: Contiguous axial images were obtained from the base of the skull through the vertex without intravenous contrast. COMPARISON:  Head CT 09/28/2019. FINDINGS: Brain: Moderate generalized cerebral atrophy. Redemonstrated large chronic right ACA territory infarct. Background mild ill-defined hypoattenuation within the cerebral white matter is nonspecific, but compatible with chronic small vessel ischemic disease. There is no acute intracranial hemorrhage. No acute demarcated cortical infarct is identified. No extra-axial fluid collection. No evidence of intracranial mass. No midline shift. Vascular: No hyperdense vessel.  Atherosclerotic calcifications. Skull: Normal. Negative for fracture or focal lesion. Sinuses/Orbits: Visualized orbits show no acute finding. Mild ethmoid sinus mucosal thickening. Small right maxillary sinus mucous retention cyst. Trace fluid within the bilateral mastoid air cells. IMPRESSION: No evidence of acute intracranial abnormality. Redemonstrated large chronic right ACA territory infarct. Stable background moderate cerebral atrophy and mild chronic small vessel ischemic disease. Mild paranasal sinus disease as described. Trace bilateral mastoid effusions. Electronically Signed   By: Jackey Loge DO   On: 01/29/2020 14:59   DG CHEST PORT 1 VIEW  Result Date: 01/29/2020 CLINICAL DATA:  Acute hypoxic respiratory failure. EXAM: PORTABLE CHEST 1 VIEW COMPARISON:  Earlier film, same date. FINDINGS: The cardiac silhouette, mediastinal and hilar contours are stable. Low lung volumes with vascular crowding and streaky basilar atelectasis. No definite infiltrates or effusions. No pneumothorax. IMPRESSION: Low lung volumes with vascular crowding and streaky basilar  atelectasis. Electronically Signed   By: Rudie Meyer M.D.   On: 01/29/2020 18:34   DG Chest Port 1 View  Result Date: 01/29/2020 CLINICAL DATA:  86 year old male with possible sepsis. Possible seizure activity at 0730 hours. EXAM: PORTABLE CHEST 1 VIEW COMPARISON:  Portable chest 09/28/2019 and earlier. FINDINGS: Portable AP semi upright view at 1247 hours. Improved lung volumes. Mediastinal contours are stable with mild tortuosity of the aorta. Visualized tracheal air column is within normal limits. Allowing for portable technique the lungs are clear. Negative visible bowel gas pattern. No acute osseous abnormality identified. IMPRESSION: No acute cardiopulmonary abnormality. Electronically Signed   By: Odessa Fleming M.D.   On: 01/29/2020 12:56   ECHOCARDIOGRAM COMPLETE  Result Date: 01/30/2020    ECHOCARDIOGRAM REPORT   Patient Name:   MAXIME BECKNER Date of Exam: 01/30/2020 Medical Rec #:  [id number]        Height:       72.0 in Accession #:    [id number]       Weight:       162.0 lb Date of Birth:  04-06-1931        BSA:          1.948 m Patient Age:    88 years         BP:  128/65 mmHg Patient Gender: M                HR:           60 bpm. Exam Location:  Inpatient Procedure: 2D Echo, Cardiac Doppler, Color Doppler and Intracardiac            Opacification Agent Indications:    Atrial fibrillation  History:        Patient has no prior history of Echocardiogram examinations.                 Risk Factors:Hypertension and Diabetes. H/O stroke.  Sonographer:    Ross Ludwig RDCS (AE) Referring Phys: [phone] DAVID GIRGUIS IMPRESSIONS  1. Left ventricular ejection fraction, by estimation, is 55 to 60%. The left ventricle has normal function. The left ventricle has no regional wall motion abnormalities. There is moderate concentric left ventricular hypertrophy. Left ventricular diastolic function could not be evaluated.  2. Right ventricular systolic function is normal. The right ventricular size is normal.  Mildly increased right ventricular wall thickness.  3. The mitral valve is normal in structure. No evidence of mitral valve regurgitation.  4. The aortic valve is grossly normal. Aortic valve regurgitation is not visualized.  5. There is borderline dilatation of the ascending aorta, measuring 39 mm. Comparison(s): No prior Echocardiogram. FINDINGS  Left Ventricle: Suboptimal gradients. Left ventricular ejection fraction, by estimation, is 55 to 60%. The left ventricle has normal function. The left ventricle has no regional wall motion abnormalities. Definity contrast agent was given IV to delineate the left ventricular endocardial borders. The left ventricular internal cavity size was normal in size. There is moderate concentric left ventricular hypertrophy. Left ventricular diastolic function could not be evaluated. Right Ventricle: The right ventricular size is normal. Mildly increased right ventricular wall thickness. Right ventricular systolic function is normal. Left Atrium: Left atrial size was normal in size. Right Atrium: Right atrial size was normal in size. Pericardium: There is no evidence of pericardial effusion. Mitral Valve: The mitral valve is normal in structure. No evidence of mitral valve regurgitation. Tricuspid Valve: The tricuspid valve is grossly normal. Tricuspid valve regurgitation is not demonstrated. Aortic Valve: The aortic valve is grossly normal. Aortic valve regurgitation is not visualized. Pulmonic Valve: The pulmonic valve was normal in structure. Pulmonic valve regurgitation is not visualized. Aorta: The aortic root is normal in size and structure. There is borderline dilatation of the ascending aorta, measuring 39 mm. Venous: The pulmonary veins were not well visualized. IAS/Shunts: The atrial septum is grossly normal.  LEFT VENTRICLE PLAX 2D LVIDd:         5.00 cm LVIDs:         3.50 cm LV PW:         1.30 cm LV IVS:        1.30 cm LVOT diam:     2.20 cm LV SV:         46 LV SV  Index:   24 LVOT Area:     3.80 cm  RIGHT VENTRICLE RV Basal diam:  3.50 cm RV Mid diam:    3.00 cm RV S prime:     13.70 cm/s TAPSE (M-mode): 1.8 cm LEFT ATRIUM             Index       RIGHT ATRIUM           Index LA diam:        2.80 cm 1.44 cm/m  RA  Area:     16.50 cm LA Vol (A2C):   31.8 ml 16.32 ml/m RA Volume:   42.60 ml  21.87 ml/m LA Vol (A4C):   39.3 ml 20.17 ml/m LA Biplane Vol: 36.1 ml 18.53 ml/m  AORTIC VALVE LVOT Vmax:   67.90 cm/s LVOT Vmean:  42.733 cm/s LVOT VTI:    0.121 m  AORTA Ao Root diam: 3.40 cm Ao Asc diam:  3.90 cm  SHUNTS Systemic VTI:  0.12 m Systemic Diam: 2.20 cm Riley LamMahesh Chandrasekhar MD Electronically signed by Riley LamMahesh Chandrasekhar MD Signature Date/Time: 01/30/2020/2:26:14 PM    Final      Labs:   Basic Metabolic Panel: Recent Labs  Lab 01/29/20 1224 01/29/20 1224 01/30/20 0224 01/30/20 0224 01/31/20 0353 02/01/20 0110  NA 137  --  133*  --  138 140  K 5.2*   < > 3.7   < > 3.6 3.0*  CL 101  --  97*  --  102 106  CO2 24  --  28  --  29 27  GLUCOSE 201*  --  125*  --  164* 148*  BUN 10  --  7*  --  11 15  CREATININE 0.64  --  0.75  --  0.68 1.43*  CALCIUM 9.1  --  9.0  --  8.8* 8.4*  MG  --   --  1.4*  --  2.0 1.8   < > = values in this interval not displayed.   GFR Estimated Creatinine Clearance: 36.2 mL/min (A) (by C-G formula based on SCr of 1.43 mg/dL (H)). Liver Function Tests: Recent Labs  Lab 01/29/20 1224  AST 27  ALT 10  ALKPHOS 74  BILITOT 0.8  PROT 6.3*  ALBUMIN 3.6   No results for input(s): LIPASE, AMYLASE in the last 168 hours. Recent Labs  Lab 01/29/20 1331  AMMONIA 32   Coagulation profile Recent Labs  Lab 01/29/20 1331  INR 1.0    CBC: Recent Labs  Lab 01/29/20 1224 01/30/20 0224 01/31/20 0353 02/01/20 0110  WBC 13.7* 10.6* 7.4 8.6  NEUTROABS 11.8* 7.5 4.1 5.6  HGB 14.8 14.5 13.5 12.0*  HCT 47.0 45.8 42.9 38.1*  MCV 92.0 92.3 92.9 92.3  PLT 247 270 209 199   Cardiac Enzymes: Recent Labs  Lab  01/29/20 1531  CKTOTAL 28*   BNP: Invalid input(s): POCBNP CBG: Recent Labs  Lab 01/31/20 2012 01/31/20 2054 02/01/20 0030 02/01/20 0352 02/01/20 0815  GLUCAP 165* 151* 132* 133* 142*   D-Dimer No results for input(s): DDIMER in the last 72 hours. Hgb A1c Recent Labs    01/29/20 1224  HGBA1C 7.2*   Lipid Profile No results for input(s): CHOL, HDL, LDLCALC, TRIG, CHOLHDL, LDLDIRECT in the last 72 hours. Thyroid function studies No results for input(s): TSH, T4TOTAL, T3FREE, THYROIDAB in the last 72 hours.  Invalid input(s): FREET3 Anemia work up No results for input(s): VITAMINB12, FOLATE, FERRITIN, TIBC, IRON, RETICCTPCT in the last 72 hours. Microbiology Recent Results (from the past 240 hour(s))  Blood Culture (routine x 2)     Status: None (Preliminary result)   Collection Time: 01/29/20  1:29 PM   Specimen: BLOOD  Result Value Ref Range Status   Specimen Description BLOOD SITE NOT SPECIFIED  Final   Special Requests   Final    BOTTLES DRAWN AEROBIC AND ANAEROBIC Blood Culture adequate volume   Culture   Final    NO GROWTH 2 DAYS Performed at [facility], [address] [place], [place] [address]    Report Status PENDING  Incomplete  Blood Culture (routine x 2)     Status: None (Preliminary result)   Collection Time: 01/29/20  1:30 PM   Specimen: BLOOD  Result Value Ref Range Status   Specimen Description BLOOD SITE NOT SPECIFIED  Final   Special Requests   Final    BOTTLES DRAWN AEROBIC AND ANAEROBIC Blood Culture results may not be optimal due to an inadequate volume of blood received in culture bottles   Culture   Final    NO GROWTH 2 DAYS Performed at [facility], [address], [place], [place] [address]    Report Status PENDING  Incomplete  Urine culture     Status: Abnormal   Collection Time: 01/29/20  3:06 PM   Specimen: In/Out Cath Urine  Result Value Ref Range Status   Specimen Description IN/OUT CATH URINE  Final   Special  Requests   Final    NONE Performed at [facility], [address] [place], [place] [address]    Culture MULTIPLE SPECIES PRESENT, SUGGEST RECOLLECTION (A)  Final   Report Status 01/30/2020 FINAL  Final  Respiratory Panel by RT PCR (Flu A&B, Covid) - Nasopharyngeal Swab     Status: None   Collection Time: 01/30/20  7:49 PM   Specimen: Nasopharyngeal Swab  Result Value Ref Range Status   SARS Coronavirus 2 by RT PCR NEGATIVE NEGATIVE Final    Comment: (NOTE) SARS-CoV-2 target nucleic acids are NOT DETECTED.  The SARS-CoV-2 RNA is generally detectable in upper respiratoy specimens during the acute phase of infection. The lowest concentration of SARS-CoV-2 viral copies this assay can detect is 131 copies/mL. A negative result does not preclude SARS-Cov-2 infection and should not be used as the sole basis for treatment or other patient management decisions. A negative result may occur with  improper specimen collection/handling, submission of specimen other than nasopharyngeal swab, presence of viral mutation(s) within the areas targeted by this assay, and inadequate number of viral copies (<131 copies/mL). A negative result must be combined with clinical observations, patient history, and epidemiological information. The expected result is Negative.  Fact Sheet for Patients:  https://www.moore.com/  Fact Sheet for Healthcare Providers:  https://www.young.biz/  This test is no t yet approved or cleared by the [place] FDA and  has been authorized for detection and/or diagnosis of SARS-CoV-2 by FDA under an Emergency Use Authorization (EUA). This EUA will remain  in effect (meaning this test can be used) for the duration of the COVID-19 declaration under Section 564(b)(1) of the Act, 21 U.S.C. section 360bbb-3(b)(1), unless the authorization is terminated or revoked sooner.     Influenza A by PCR NEGATIVE NEGATIVE Final    Influenza B by PCR NEGATIVE NEGATIVE Final    Comment: (NOTE) The Xpert Xpress SARS-CoV-2/FLU/RSV assay is intended as an aid in  the diagnosis of influenza from Nasopharyngeal swab specimens and  should not be used as a sole basis for treatment. Nasal washings and  aspirates are unacceptable for Xpert Xpress SARS-CoV-2/FLU/RSV  testing.  Fact Sheet for Patients: https://www.moore.com/  Fact Sheet for Healthcare Providers: https://www.young.biz/  This test is not yet approved or cleared by the [place] FDA and  has been authorized for detection and/or diagnosis of SARS-CoV-2 by  FDA under an Emergency Use Authorization (EUA). This EUA will remain  in effect (meaning this test can be used) for the duration of the  Covid-19  declaration under Section 564(b)(1) of the Act, 21  U.S.C. section 360bbb-3(b)(1), unless the authorization is  terminated or revoked. Performed at [facility], [address] [place], [place] [address]   MRSA PCR Screening     Status: None   Collection Time: 01/30/20  7:49 PM   Specimen: Nasopharyngeal Swab  Result Value Ref Range Status   MRSA by PCR NEGATIVE NEGATIVE Final    Comment:        The GeneXpert MRSA Assay (FDA approved for NASAL specimens only), is one component of a comprehensive MRSA colonization surveillance program. It is not intended to diagnose MRSA infection nor to guide or monitor treatment for MRSA infections. Performed at [facility], [address], [place], [place] [address]      Discharge Instructions:   Discharge Instructions    Diet - low sodium heart healthy   Complete by: As directed    Discharge instructions   Complete by: As directed    Follow-up with your primary care provider at the skilled nursing facility in 3 to 5 days.  Check blood work at that time.  Avoid olanzapine or antipsychotics if possible.  Okay to continue Celexa. Aricept has been added to the  regimen.   Increase activity slowly   Complete by: As directed      Allergies as of 02/01/2020   No Known Allergies     Medication List    STOP taking these medications   clopidogrel 75 MG tablet Commonly known as: PLAVIX   OLANZapine 5 MG tablet Commonly known as: ZYPREXA     TAKE these medications   acetaminophen 325 MG tablet Commonly known as: TYLENOL Take 650 mg by mouth See admin instructions. Take 650 mg by mouth three times a day and an additional 650 mg every six hours as needed for pain   amLODipine 5 MG tablet Commonly known as: NORVASC Take 5 mg by mouth in the morning.   apixaban 5 MG Tabs tablet Commonly known as: ELIQUIS Take 1 tablet (5 mg total) by mouth 2 (two) times daily.   citalopram 10 MG tablet Commonly known as: CELEXA Take 15 mg by mouth in the morning.   divalproex 125 MG capsule Commonly known as: DEPAKOTE SPRINKLE Take 250 mg by mouth in the morning and at bedtime.   donepezil 5 MG tablet Commonly known as: Aricept Take 1 tablet (5 mg total) by mouth at bedtime.   metFORMIN 500 MG tablet Commonly known as: GLUCOPHAGE Take 1,000 mg by mouth 2 (two) times daily.   multivitamin with minerals tablet Take 1 tablet by mouth daily.   NONFORMULARY OR COMPOUNDED ITEM Apply 1 application topically See admin instructions. ABH compounded gel: Lorazepam 0.5 mg/Diphenhydramine HCl 25 mg/Haloperidol 0.5 mg/ml and Lipoderm gel- Apply to skin every 12 hours AND an additional application every 8 hours as needed for restlessness or agitation   polyethylene glycol powder 17 GM/SCOOP powder Commonly known as: GLYCOLAX/MIRALAX Take 17 g by mouth daily.   potassium chloride 10 MEQ tablet Commonly known as: KLOR-CON Take 1 tablet (10 mEq total) by mouth daily.   sennosides-docusate sodium 8.6-50 MG tablet Commonly known as: SENOKOT-S Take 2 tablets by mouth 2 (two) times daily.   tamsulosin 0.4 MG Caps capsule Commonly known as: FLOMAX Take 0.4  mg by mouth at bedtime.   Voltaren 1 % Gel Generic drug: diclofenac Sodium Apply 2 g topically See admin instructions. Apply 2 grams to both shoulders two times a  day         Time coordinating discharge: 39 minutes  Signed:  Nate Perri  [facility] 02/01/2020, 10:02 AM

## 2020-02-01 NOTE — Progress Notes (Signed)
Attempted to call report to receiving facility x2, no answer, unable to leave message.

## 2020-02-01 NOTE — TOC Benefit Eligibility Note (Signed)
Transition of Care [facility]) Benefit Eligibility Note    Patient Details  Name: Jeffery Haynes MRN: [id number] Date of Birth: 03-Jan-1932   Medication/Dose: Everlene Balls   2.5 MG  BID  and  ELIQUIS  5 MG BID                       Additional Notes: MEDICAIS OD [place]  : EFF-DATE- 11-13-1996  CO-PAY- $4.00 FOR EACH PRESCRIPTION    Mardene Sayer Phone Number: 02/01/2020, 12:52 PM

## 2020-02-01 NOTE — Progress Notes (Signed)
Patient discharged via [facility]. All belongings returned. Family notified.

## 2020-02-01 NOTE — Progress Notes (Signed)
New order for potassium replacement by mouth, administered without incident.  Spoke to wife via phone, she is aware of DC plans.  No questions or concerns noted at this time.  Transport arranged.

## 2020-02-01 NOTE — Care Management Important Message (Signed)
Important Message  Patient Details  Name: Jeffery Haynes MRN: [id number] Date of Birth: 21-Jun-1931   Medicare Important Message Given:  Yes  Im mailed to the patient home address.    [name] 02/01/2020, 3:35 PM

## 2020-02-01 NOTE — TOC Transition Note (Signed)
Transition of Care [facility]) - CM/SW Discharge Note   Patient Details  Name: Jeffery Haynes MRN: [id number] Date of Birth: Sep 13, 1931  Transition of Care [facility]) CM/SW Contact:  Carmina Miller, LCSWA Phone Number: 02/01/2020, 12:13 PM   Clinical Narrative:    Patient will DC to: [facility] Care Anticipated DC date: 02/01/20 Family notified: Marchia Meiers  Transport by: Sharin Mons   Per MD patient ready for DC to [facility]. RN to call report prior to discharge [phone]. RN, patient, patient's family, and facility notified of DC. Discharge Summary and FL2 sent to facility. DC packet on chart. Ambulance transport requested for patient.   CSW will sign off for now as social work intervention is no longer needed. Please consult [place] again if new needs arise.           Patient Goals and CMS Choice        Discharge Placement                       Discharge Plan and Services                                     Social Determinants of Health (SDOH) Interventions     Readmission Risk Interventions No flowsheet data found.

## 2020-02-03 LAB — CULTURE, BLOOD (ROUTINE X 2)
Culture: NO GROWTH
Culture: NO GROWTH
Special Requests: ADEQUATE

## 2020-06-06 ENCOUNTER — Emergency Department (HOSPITAL_COMMUNITY): Payer: Medicare Other

## 2020-06-06 ENCOUNTER — Inpatient Hospital Stay (HOSPITAL_COMMUNITY)
Admission: EM | Admit: 2020-06-06 | Discharge: 2020-06-10 | DRG: 535 | Disposition: A | Discharge status: Skilled Nursing Facility | Payer: Medicare Other | Source: Skilled Nursing Facility | Attending: Internal Medicine | Admitting: Internal Medicine

## 2020-06-06 ENCOUNTER — Encounter (HOSPITAL_COMMUNITY): Payer: Self-pay

## 2020-06-06 DIAGNOSIS — S72002A Fracture of unspecified part of neck of left femur, initial encounter for closed fracture: Secondary | ICD-10-CM

## 2020-06-06 DIAGNOSIS — S72402A Unspecified fracture of lower end of left femur, initial encounter for closed fracture: Secondary | ICD-10-CM

## 2020-06-06 DIAGNOSIS — Z7401 Bed confinement status: Secondary | ICD-10-CM

## 2020-06-06 DIAGNOSIS — Z66 Do not resuscitate: Secondary | ICD-10-CM | POA: Diagnosis present

## 2020-06-06 DIAGNOSIS — S72012A Unspecified intracapsular fracture of left femur, initial encounter for closed fracture: Principal | ICD-10-CM | POA: Diagnosis present

## 2020-06-06 DIAGNOSIS — R0902 Hypoxemia: Secondary | ICD-10-CM | POA: Diagnosis not present

## 2020-06-06 DIAGNOSIS — Z993 Dependence on wheelchair: Secondary | ICD-10-CM

## 2020-06-06 DIAGNOSIS — S72409A Unspecified fracture of lower end of unspecified femur, initial encounter for closed fracture: Secondary | ICD-10-CM

## 2020-06-06 DIAGNOSIS — Z7901 Long term (current) use of anticoagulants: Secondary | ICD-10-CM

## 2020-06-06 DIAGNOSIS — W19XXXA Unspecified fall, initial encounter: Secondary | ICD-10-CM | POA: Diagnosis present

## 2020-06-06 DIAGNOSIS — Y92129 Unspecified place in nursing home as the place of occurrence of the external cause: Secondary | ICD-10-CM

## 2020-06-06 DIAGNOSIS — J690 Pneumonitis due to inhalation of food and vomit: Secondary | ICD-10-CM | POA: Diagnosis not present

## 2020-06-06 DIAGNOSIS — F05 Delirium due to known physiological condition: Secondary | ICD-10-CM | POA: Diagnosis present

## 2020-06-06 DIAGNOSIS — F0280 Dementia in other diseases classified elsewhere without behavioral disturbance: Secondary | ICD-10-CM | POA: Diagnosis present

## 2020-06-06 DIAGNOSIS — I10 Essential (primary) hypertension: Secondary | ICD-10-CM | POA: Diagnosis present

## 2020-06-06 DIAGNOSIS — S72492A Other fracture of lower end of left femur, initial encounter for closed fracture: Secondary | ICD-10-CM | POA: Diagnosis present

## 2020-06-06 DIAGNOSIS — Z7984 Long term (current) use of oral hypoglycemic drugs: Secondary | ICD-10-CM

## 2020-06-06 DIAGNOSIS — I480 Paroxysmal atrial fibrillation: Secondary | ICD-10-CM | POA: Diagnosis present

## 2020-06-06 DIAGNOSIS — R059 Cough, unspecified: Secondary | ICD-10-CM

## 2020-06-06 DIAGNOSIS — E119 Type 2 diabetes mellitus without complications: Secondary | ICD-10-CM

## 2020-06-06 DIAGNOSIS — Z20822 Contact with and (suspected) exposure to covid-19: Secondary | ICD-10-CM | POA: Diagnosis present

## 2020-06-06 DIAGNOSIS — G309 Alzheimer's disease, unspecified: Secondary | ICD-10-CM | POA: Diagnosis present

## 2020-06-06 LAB — RESP PANEL BY RT-PCR (FLU A&B, COVID) ARPGX2
Influenza A by PCR: NEGATIVE
Influenza B by PCR: NEGATIVE
SARS Coronavirus 2 by RT PCR: NEGATIVE

## 2020-06-06 IMAGING — CR DG HIP (WITH OR WITHOUT PELVIS) 2-3V*L*
3 series · 3 of 3 positions shown · non-contrast
Comparison: None.

CLINICAL DATA: Fall yesterday, pain

EXAM:
DG HIP (WITH OR WITHOUT PELVIS) 2-3V LEFT

[w hip lat left]
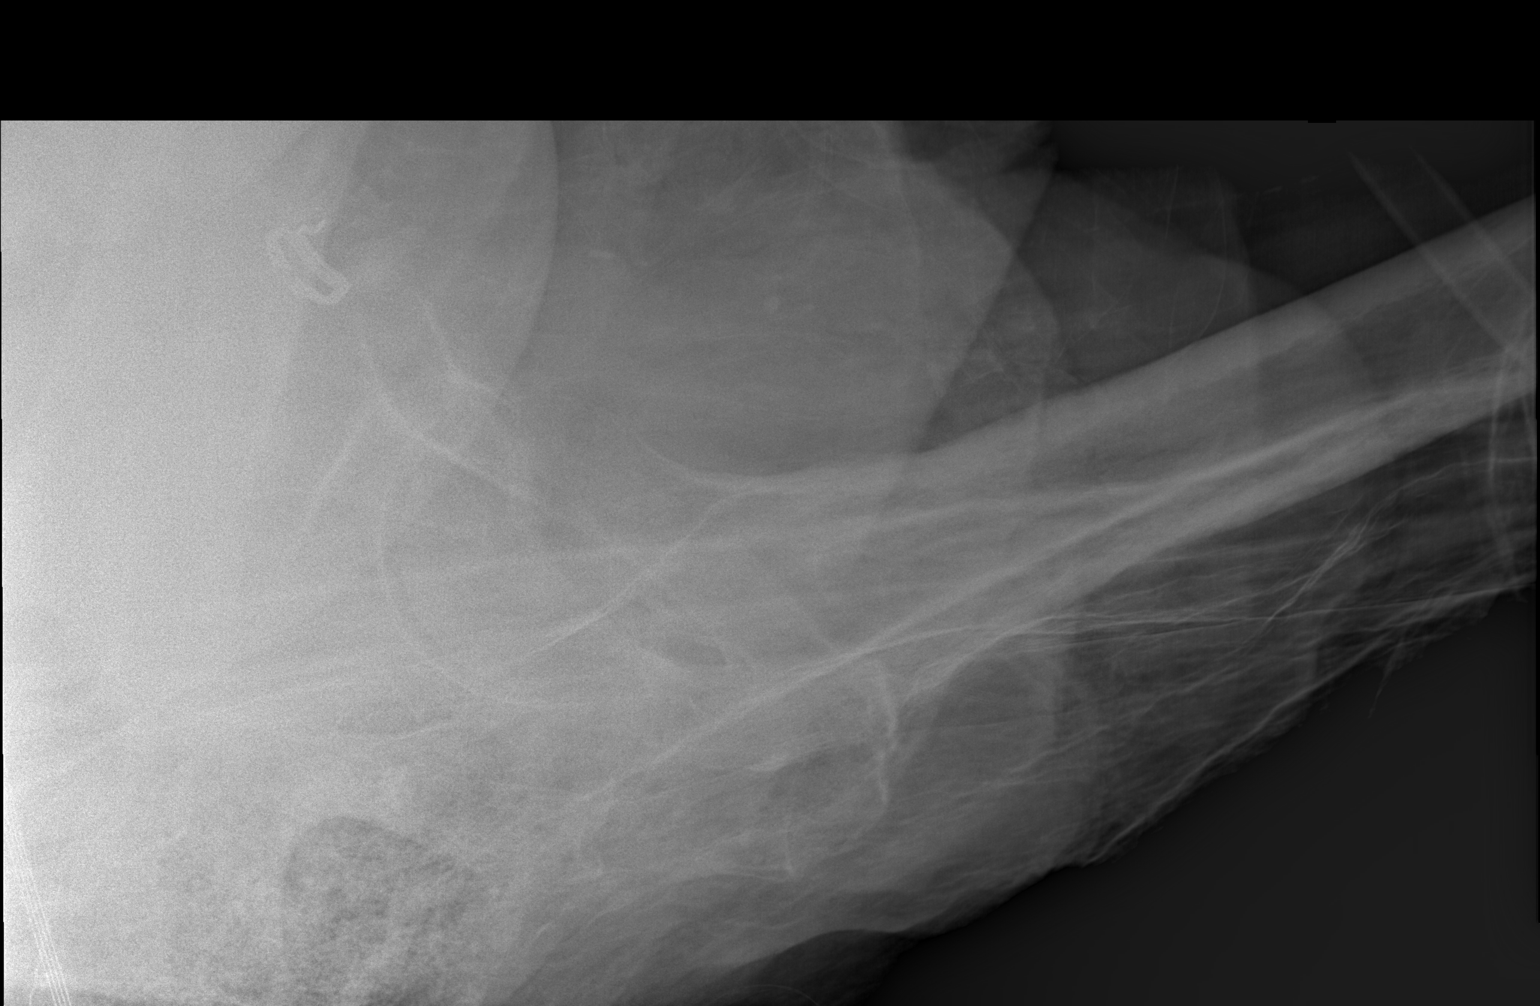

[x pelvis]
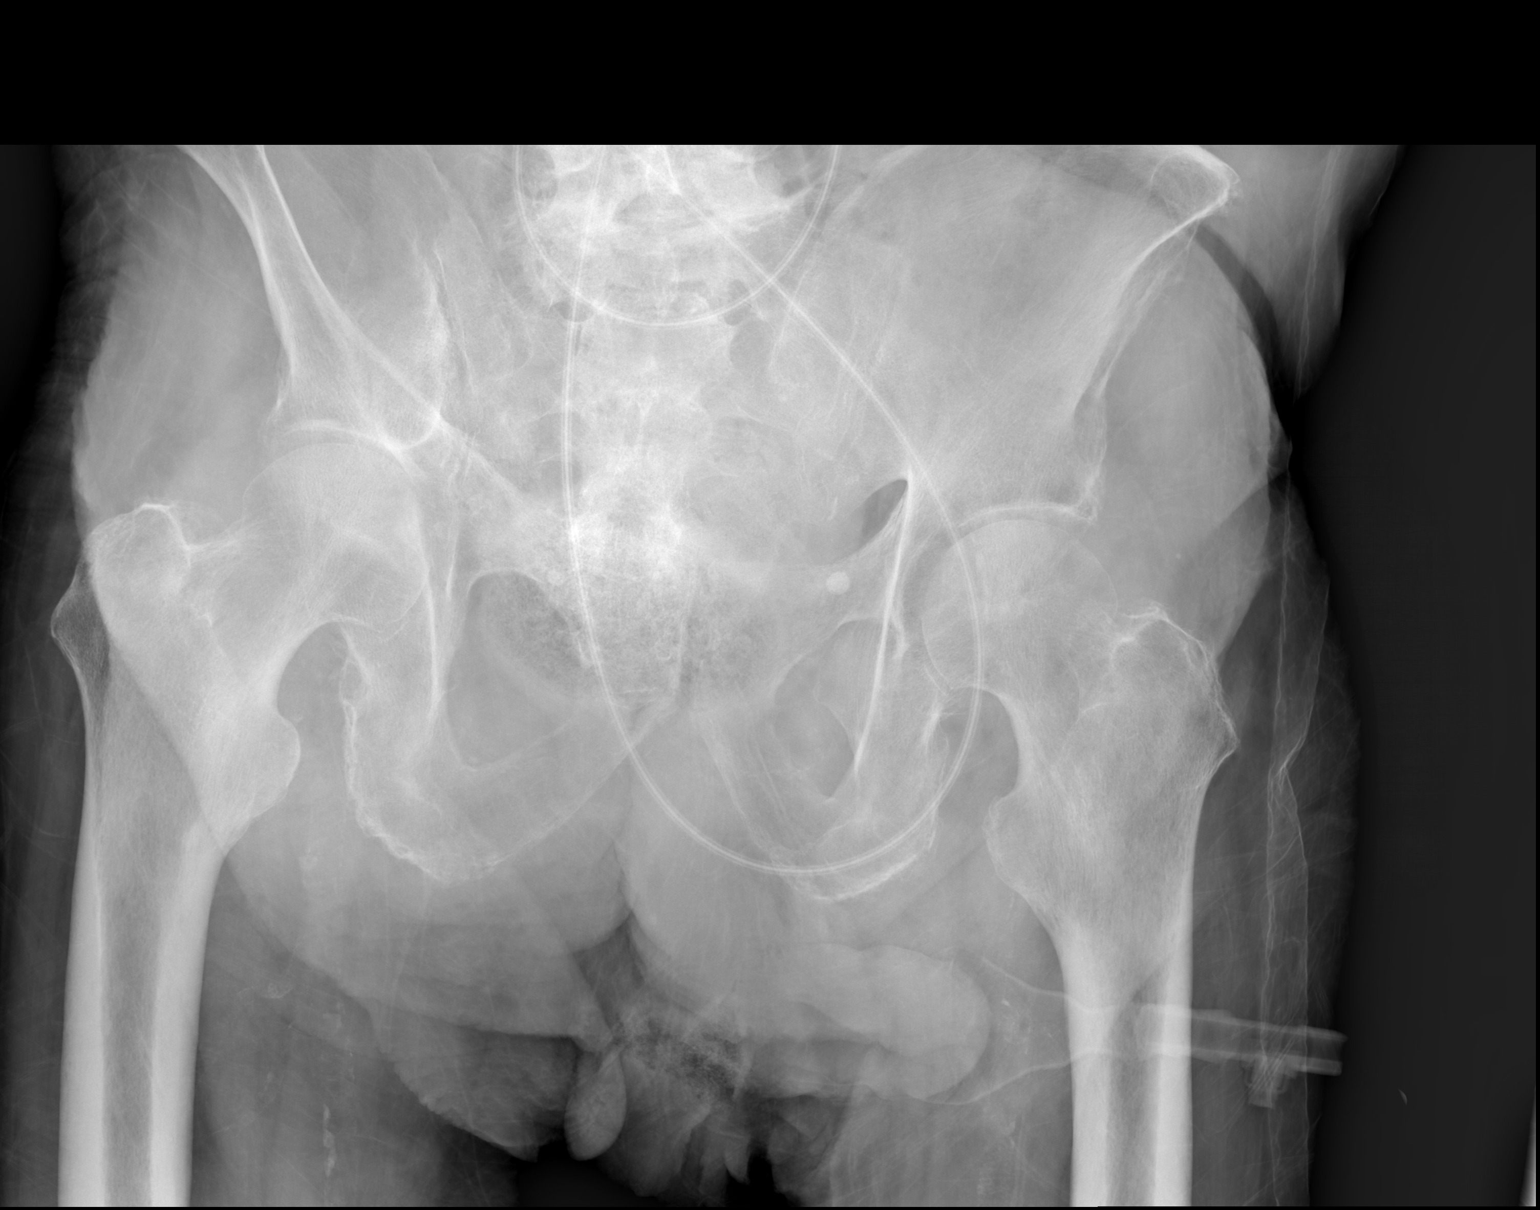

[x hip ap left]
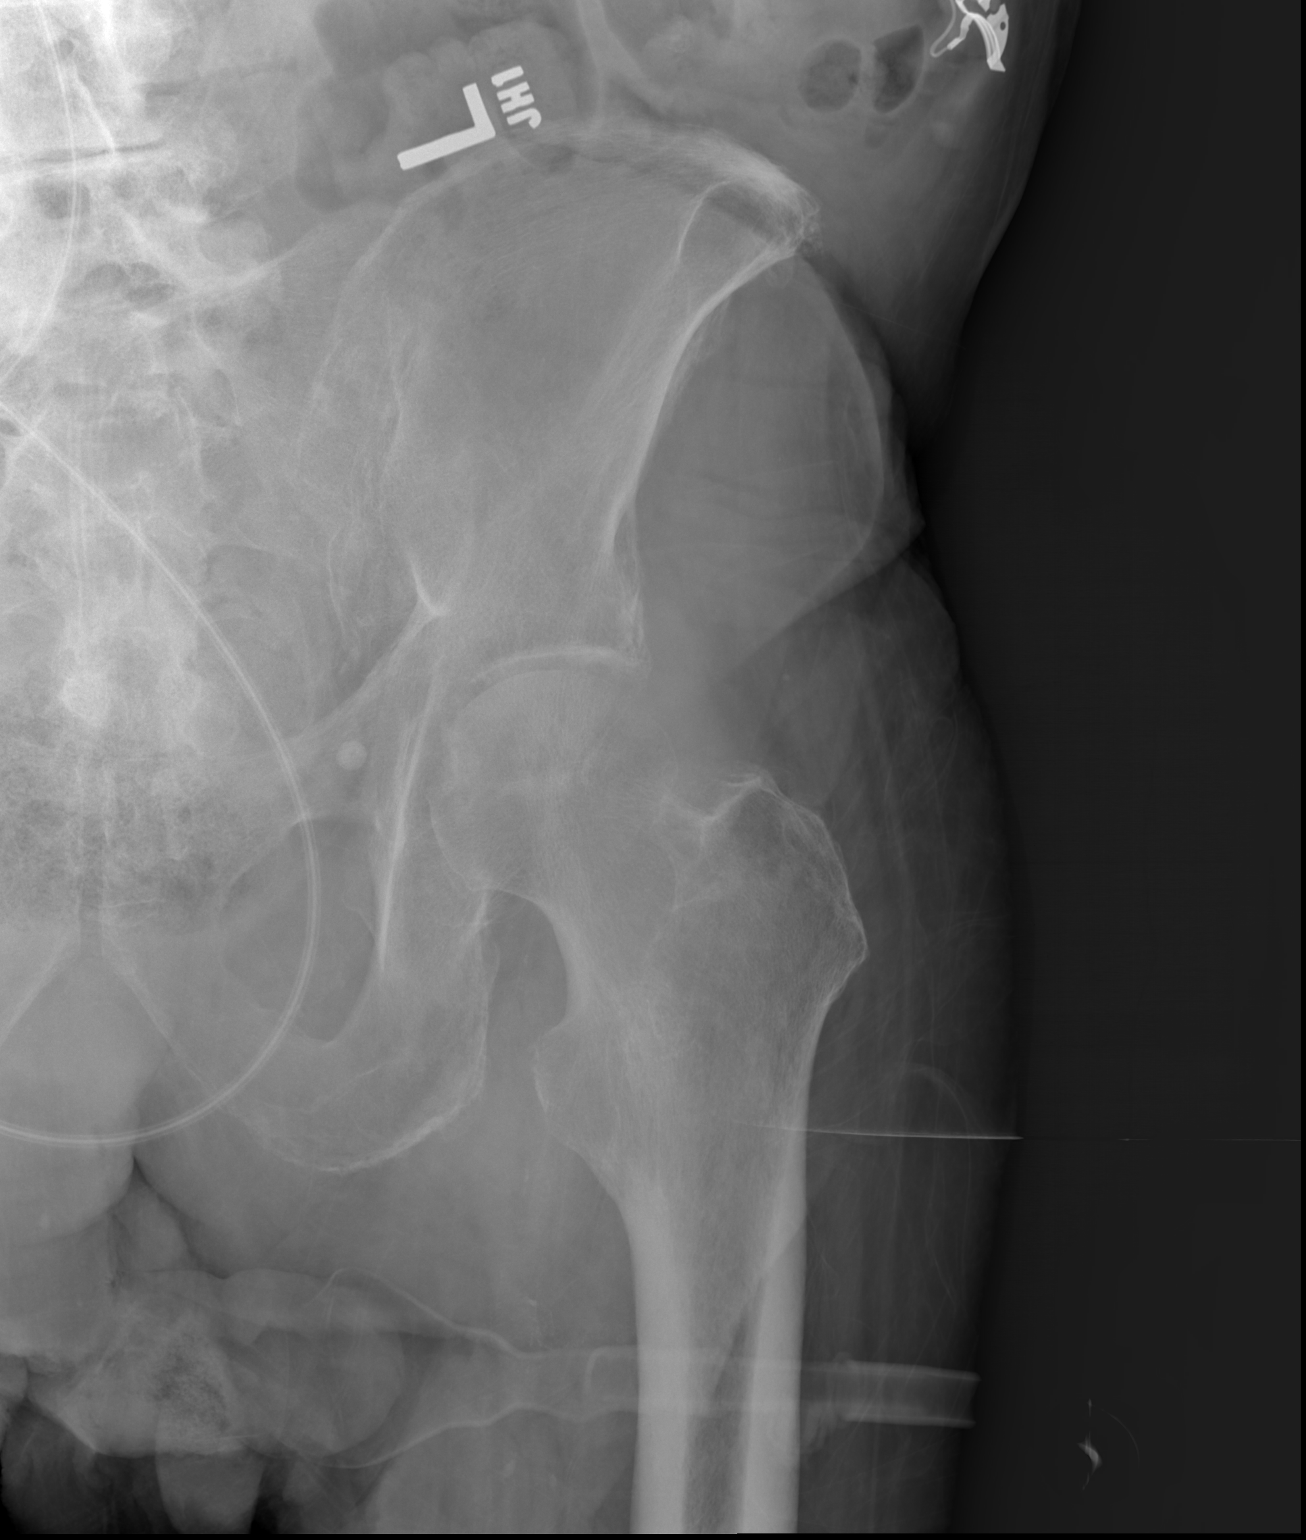

[3 of 3 positions shown; findings below may reference images not displayed]

FINDINGS: Technically very limited radiographs. Osteopenia. Impacted
subcapital fracture of the left femoral neck. No other obvious
fracture of the pelvis or proximal right femur.
IMPRESSION: Impacted subcapital fracture of the left femoral neck. No other
obvious fracture of the pelvis or proximal right femur. Osteopenia.

## 2020-06-06 IMAGING — CT CT HIP*L* W/O CM
2 of 3 series · 16 of 46 positions shown, 18 images · non-contrast
Comparison: None.

CLINICAL DATA: Left leg pain following fall with findings
suggestive of subcapital femoral neck fracture on plain film

EXAM:
CT OF THE LEFT HIP WITHOUT CONTRAST
TECHNIQUE: Multidetector CT imaging of the left hip was performed according to
the standard protocol. Multiplanar CT image reconstructions were
also generated.

[Series 3: axial st · axial · 0.58mm/px · z∈[-334,-30]mm · 13 of 176 slices shown, 15 images]
[im 12/176  soft-tissue]
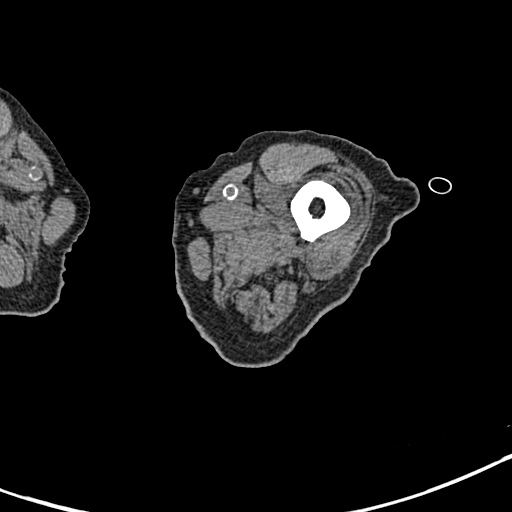
[im 12/176  bone]
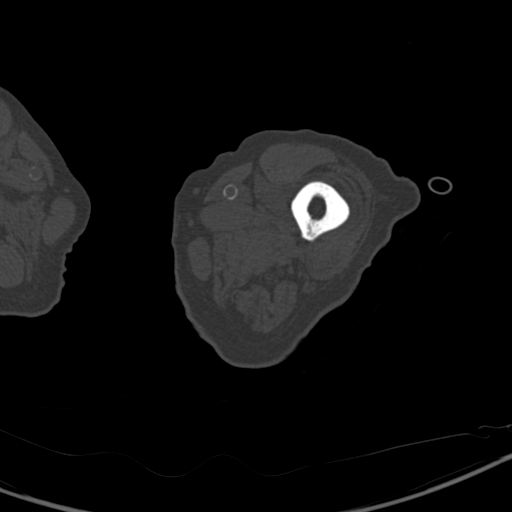
[im 23/176  soft-tissue]
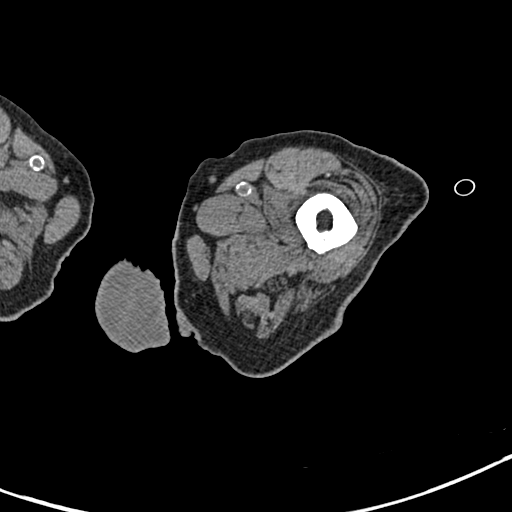
[im 34/176  soft-tissue]
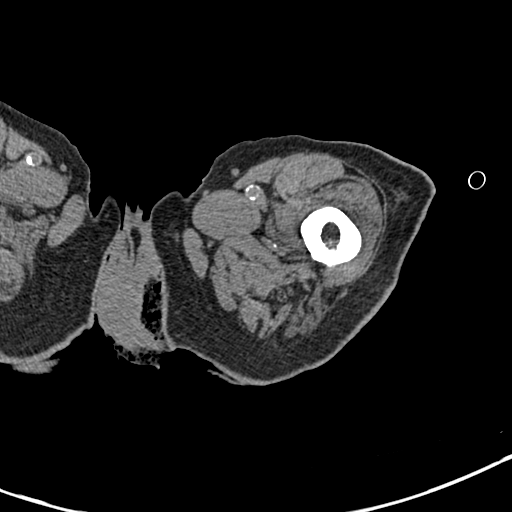
[im 51/176  soft-tissue]
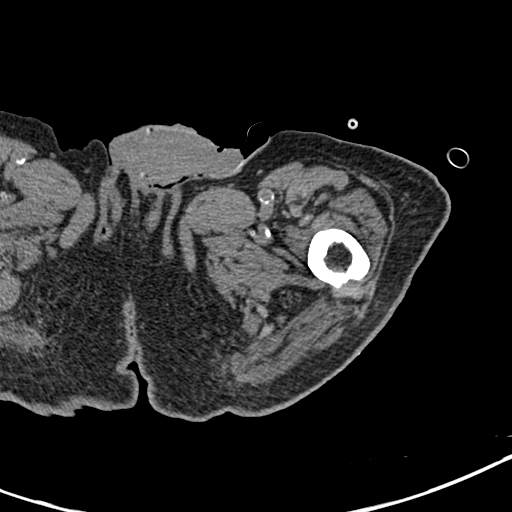
[im 63/176  soft-tissue]
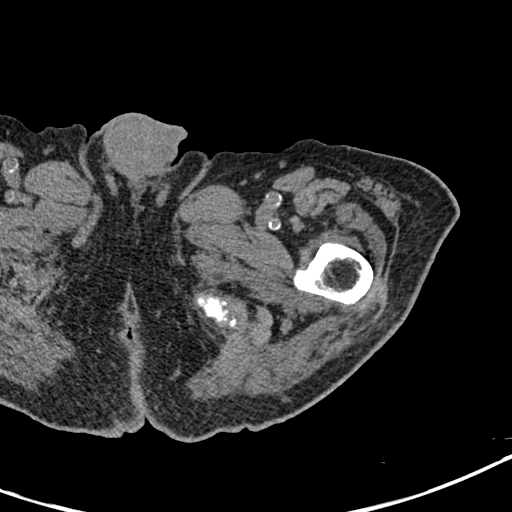
[im 74/176  soft-tissue]
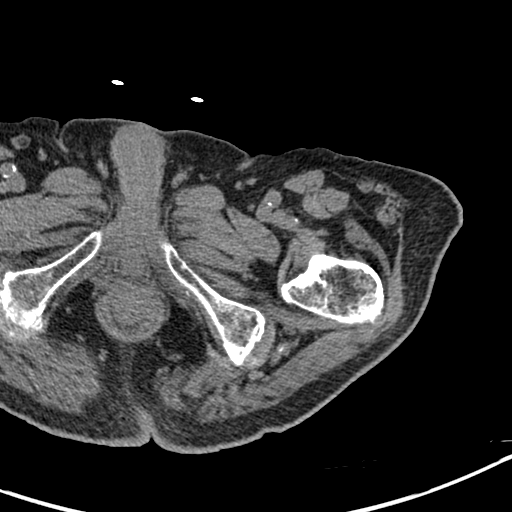
[im 91/176  soft-tissue]
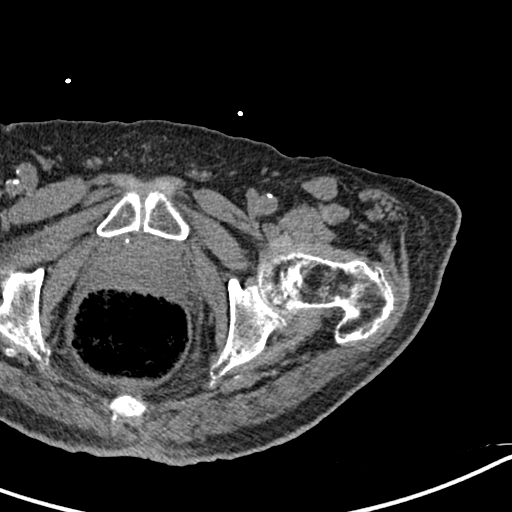
[im 102/176  soft-tissue]
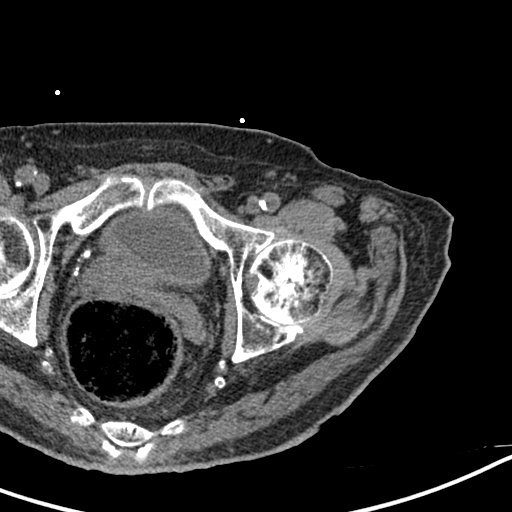
[im 113/176  soft-tissue]
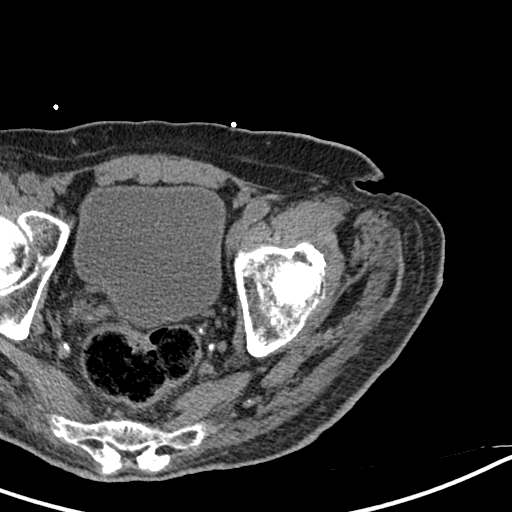
[im 113/176  bone]
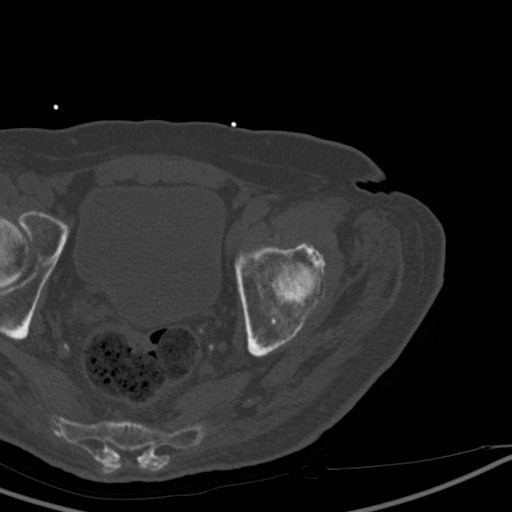
[im 125/176  soft-tissue]
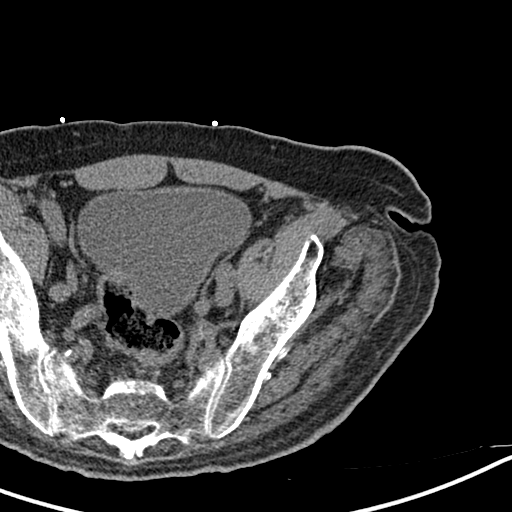
[im 142/176  soft-tissue]
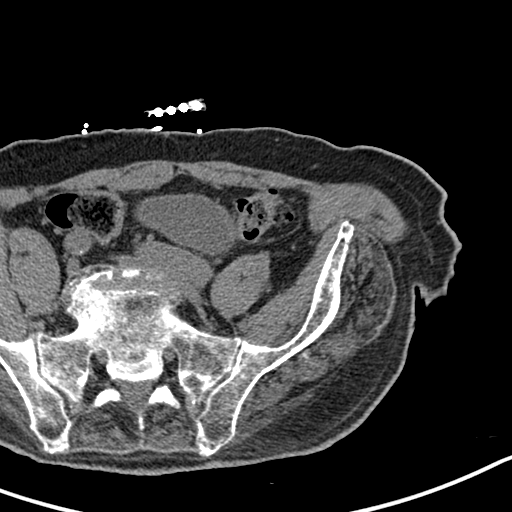
[im 153/176  soft-tissue]
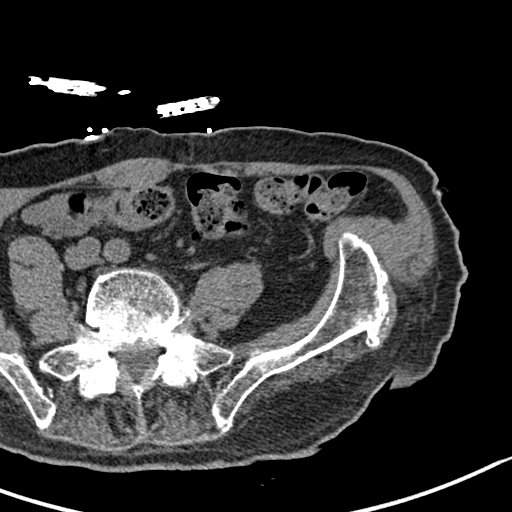
[im 164/176  soft-tissue]
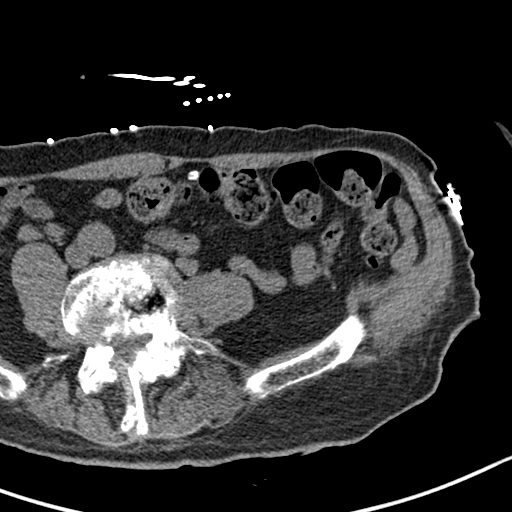

[Series 8: coronal st · coronal · 0.52mm/px · 3 of 125 slices shown]
[im 42/125  soft-tissue]
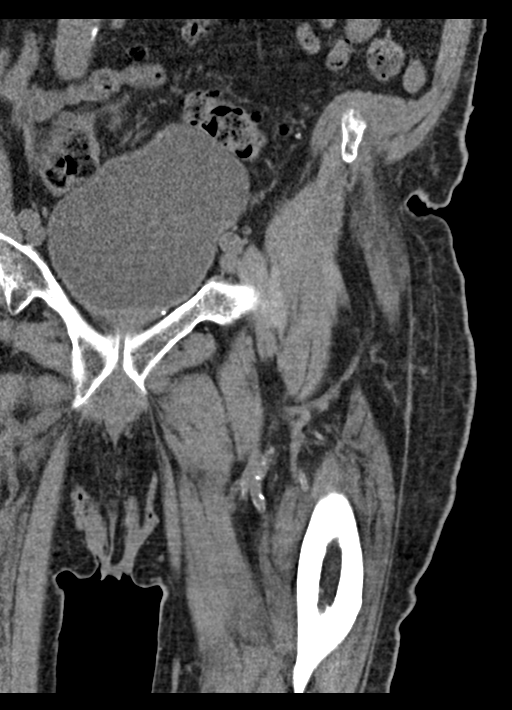
[im 56/125  soft-tissue]
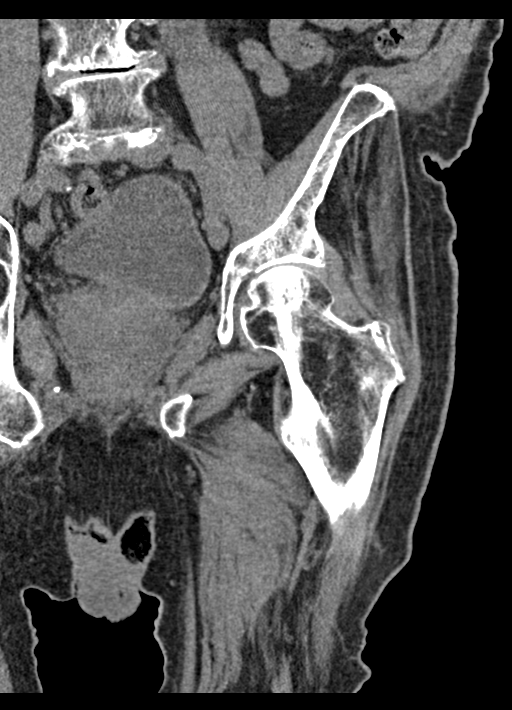
[im 69/125  soft-tissue]
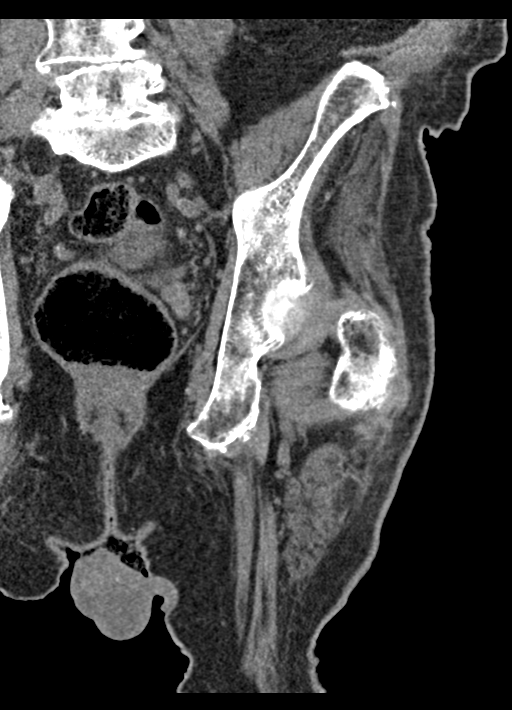

[16 of 46 positions shown; findings below may reference images not displayed]

FINDINGS: Bones/Joint/Cartilage

Diffuse osteoporosis is noted the cortical irregularity on the
lateral aspect of the femoral neck in the subcapital region is noted
similar to that seen on prior plain film. These changes are highly
suspicious for an incomplete subcapital femoral neck fracture. No
other femur fracture is seen. The remainder of the pelvic structures
show no acute fracture. Degenerative changes of the lumbar spine are
noted.

Ligaments

Suboptimally assessed by CT.

Muscles and Tendons

Surrounding musculature is within normal limits.

Soft tissues

Soft tissue structures of the pelvis and upper left thigh show no
acute abnormality.
IMPRESSION: Cortical irregularity along the lateral aspect of the femoral neck
similar to that seen on prior plain film examination. These changes
are highly suspicious for incomplete subcapital femoral neck
fracture. No other fractures are seen. Limited hip protocol MRI may
be helpful for further evaluation.

No other focal abnormality is seen.

## 2020-06-06 MED ORDER — LORAZEPAM 1 MG PO TABS
1.0000 mg | ORAL_TABLET | Freq: Once | ORAL | Status: DC
  Filled 2020-06-06: qty 1

## 2020-06-06 MED ORDER — LORAZEPAM 2 MG/ML IJ SOLN
1.0000 mg | Freq: Once | INTRAMUSCULAR | Status: DC
  Filled 2020-06-06: qty 1

## 2020-06-06 MED ORDER — LORAZEPAM 2 MG/ML IJ SOLN
1.0000 mg | Freq: Once | INTRAMUSCULAR | Status: AC
  Administered 2020-06-06: 1 mg via INTRAMUSCULAR

## 2020-06-06 NOTE — ED Triage Notes (Signed)
Ens brings pt in from nursing home. Reports fall yesterday. Pt complains of left knee and left hip pain.

## 2020-06-06 NOTE — ED Notes (Signed)
Pt spit out pill, MD aware

## 2020-06-06 NOTE — ED Notes (Signed)
Bed alarm on Yellow fall risk armband Fall risk sign outside door Yellow socks

## 2020-06-06 NOTE — ED Provider Notes (Signed)
[facility]-EMERGENCY DEPT Provider Note   CSN: [id number] Arrival date & time: 06/06/20  1643     History Chief Complaint  Patient presents with  . Leg Pain    Jeffery Haynes is a 87 y.o. male.  HPI     This is an 87 year old male with a history of Alzheimer's, diabetes who presents with left lower extremity pain.  Reported fall at his facility yesterday.  He does not provide much information.  At baseline he is only oriented to himself.  When asked he does state that his left knee hurts.  He denies any other pain at this time.  He has a history of atrial fibrillation listed in his chart but no noted history of being on anticoagulants.  Level 5 caveat for dementia  Past Medical History:  Diagnosis Date  . Alzheimer disease (HCC)   . Dementia (HCC)   . Diabetes mellitus without complication [facility])     Patient Active Problem List   Diagnosis Date Noted  . Acute metabolic encephalopathy 01/29/2020  . Atrial fibrillation with RVR (HCC) 01/29/2020  . Alzheimer disease (HCC)   . Dementia (HCC)     No past surgical history on file.     Family History  Problem Relation Age of Onset  . Seizures Neg Hx        Home Medications Prior to Admission medications   Medication Sig Start Date End Date Taking? Authorizing Provider  acetaminophen (TYLENOL) 325 MG tablet Take 650 mg by mouth See admin instructions. Take 650 mg by mouth three times a day and an additional 650 mg every six hours as needed for pain    [provider]  amLODipine (NORVASC) 5 MG tablet Take 5 mg by mouth in the morning.    [provider]  apixaban (ELIQUIS) 5 MG TABS tablet Take 1 tablet (5 mg total) by mouth 2 (two) times daily. 02/01/20 01/31/21  Pokhrel, Rebekah [place], MD  citalopram (CELEXA) 10 MG tablet Take 15 mg by mouth in the morning.    [provider]  diclofenac Sodium (VOLTAREN) 1 % GEL Apply 2 g topically See admin instructions. Apply 2 grams to  both shoulders two times a day    [provider]  divalproex (DEPAKOTE SPRINKLE) 125 MG capsule Take 250 mg by mouth in the morning and at bedtime.    [provider]  donepezil (ARICEPT) 5 MG tablet Take 1 tablet (5 mg total) by mouth at bedtime. 02/01/20 01/31/21  Pokhrel, Rebekah [place], MD  metFORMIN (GLUCOPHAGE) 500 MG tablet Take 1,000 mg by mouth 2 (two) times daily.    [provider]  Multiple Vitamins-Minerals (MULTIVITAMIN WITH MINERALS) tablet Take 1 tablet by mouth daily.    [provider]  NONFORMULARY OR COMPOUNDED ITEM Apply 1 application topically See admin instructions. AB[facility] compounded gel: Lorazepam 0.5 mg/Diphenhydramine HCl 25 mg/Haloperidol 0.5 mg/ml and Lipoderm gel- Apply to skin every 12 hours AND an additional application every 8 hours as needed for restlessness or agitation    [provider]  polyethylene glycol powder (GLYCOLAX/MIRALAX) 17 GM/SCOOP powder Take 17 g by mouth daily.    [provider]  potassium chloride (KLOR-CON) 10 MEQ tablet Take 1 tablet (10 mEq total) by mouth daily. 02/01/20   Pokhrel, Rebekah [place], MD  sennosides-docusate sodium (SENOKOT-S) 8.6-50 MG tablet Take 2 tablets by mouth 2 (two) times daily.    [provider]  tamsulosin (FLOMAX) 0.4 MG CAPS capsule Take 0.4 mg by mouth at  bedtime.    [provider]    Allergies    Olanzapine  Review of Systems   Review of Systems  Unable to perform ROS: Dementia    Physical Exam Updated Vital Signs BP 136/76   Pulse 69   Temp 98.9 F (37.2 C) (Oral)   Resp 14   SpO2 98%   Physical Exam Vitals and nursing note reviewed.  Constitutional:      Appearance: He is well-developed.  HENT:     Head: Normocephalic and atraumatic.     Nose: Nose normal.     Mouth/Throat:     Mouth: Mucous membranes are moist.  Eyes:     Pupils: Pupils are equal, round, and reactive to light.  Cardiovascular:     Rate and Rhythm: Normal rate and  regular rhythm.     Heart sounds: Normal heart sounds. No murmur heard.   Pulmonary:     Effort: Pulmonary effort is normal. No respiratory distress.     Breath sounds: Normal breath sounds. No wheezing.  Abdominal:     General: Bowel sounds are normal.     Palpations: Abdomen is soft.     Tenderness: There is no abdominal tenderness. There is no rebound.  Musculoskeletal:     Cervical back: Neck supple.     Comments: Pain with range of motion of the left knee and left hip, no obvious deformities  Lymphadenopathy:     Cervical: No cervical adenopathy.  Skin:    General: Skin is warm and dry.  Neurological:     Mental Status: He is alert.     Comments: Oriented only to himself  Psychiatric:     Comments: At times agitated     ED Results / Procedures / Treatments   Labs (all labs ordered are listed, but only abnormal results are displayed) Labs Reviewed  RESP PANEL BY RT-PCR (FLU A&B, COVID) ARPGX2  CBC WITH DIFFERENTIAL/PLATELET  BASIC METABOLIC PANEL    EKG EKG Interpretation  Date/Time:  Friday June 06 2020 21:15:54 EDT Ventricular Rate:  66 PR Interval:    QRS Duration: 96 QT Interval:  386 QTC Calculation: 405 R Axis:   46 Text Interpretation: Sinus rhythm Atrial premature complex Low voltage, precordial leads Confirmed by Ross Marcus ([id number]) on 06/06/2020 9:27:07 PM   Radiology DG Knee Complete 4 Views Left  Result Date: 06/06/2020 CLINICAL DATA:  Recent fall with left knee pain, initial encounter EXAM: LEFT KNEE - COMPLETE 4+ VIEW COMPARISON:  None. FINDINGS: There is an undisplaced distal femoral fracture involving the metaphysis without significant extension into the joint space. Degenerative changes of the knee joint are seen. Small joint effusion is noted. IMPRESSION: Undisplaced distal femoral metaphyseal fracture. Electronically Signed   By: Alcide Clever M.D.   On: 06/06/2020 20:28   DG Hip Unilat W or Wo Pelvis 2-3 Views Left  Result Date:  06/06/2020 CLINICAL DATA:  Fall yesterday, pain EXAM: DG HIP (WITH OR WITHOUT PELVIS) 2-3V LEFT COMPARISON:  None. FINDINGS: Technically very limited radiographs. Osteopenia. Impacted subcapital fracture of the left femoral neck. No other obvious fracture of the pelvis or proximal right femur. IMPRESSION: Impacted subcapital fracture of the left femoral neck. No other obvious fracture of the pelvis or proximal right femur. Osteopenia. Electronically Signed   By: Lauralyn Primes M.D.   On: 06/06/2020 20:28    Procedures Procedures   Medications Ordered in ED Medications  LORazepam (ATIVAN) tablet 1 mg (1 mg Oral Patient Refused/Not Given 06/06/20  1927)  LORazepam (ATIVAN) injection 1 mg (1 mg Intramuscular Given 06/06/20 2239)    ED Course  I have reviewed the triage vital signs and the nursing notes.  Pertinent labs & imaging results that were available during my care of the patient were reviewed by me and considered in my medical decision making (see chart for details).  Clinical Course as of 06/06/20 2250  Fri Jun 06, 2020  2054 DG Hip Unilat W or Wo Pelvis 2-3 Views Left [CH]  2056 Spoke to the patient's wife.  He is DNR.  However, she is unsure whether she would want him to have surgery or not.  She would like to know her options.  He does not have an orthopedist. [CH]  2119 Spoke to Dr. Jena Gauss, orthopedics.  Recommends n.p.o. after midnight.  We will see patient in consultation in the morning. [CH]  2249 Delay in lab work as patient is agitated.  He was given IM Ativan.  Will need hospitalist admission when lab work has resulted. [CH]    Clinical Course User Index [CH] Azya Barbero, Mayer Masker, MD   MDM Rules/Calculators/A&P                          Patient presents from his living facility with concern for a fall.  He does not provide much history and obviously has dementia.  He has pain left hip and knee.  X-rays were obtained and show both a femoral neck fracture and a distal femur  fracture.  See my discussion with Dr. Jena Gauss above.  I did speak with the patient's wife who indicates that he is DNR; however, she is interested in understanding his surgical options.  Unfortunately, there has been a significant delay in lab work.  EKG shows no evidence of acute ischemia.  Covid testing is pending.  Awaiting lab work.  Patient will need admission to the hospitalist.   Final Clinical Impression(s) / ED Diagnoses Final diagnoses:  Closed fracture of neck of left femur, initial encounter (HCC)  Closed fracture of distal end of femur, unspecified fracture morphology, initial encounter [facility])    Rx / DC Orders ED Discharge Orders    None       Shon Baton, MD 06/06/20 2251

## 2020-06-06 NOTE — ED Notes (Signed)
Placed external male condom catheter on pateint

## 2020-06-07 ENCOUNTER — Other Ambulatory Visit: Payer: Self-pay

## 2020-06-07 ENCOUNTER — Encounter (HOSPITAL_COMMUNITY): Payer: Self-pay | Admitting: Family Medicine

## 2020-06-07 DIAGNOSIS — R0902 Hypoxemia: Secondary | ICD-10-CM | POA: Diagnosis not present

## 2020-06-07 DIAGNOSIS — Z66 Do not resuscitate: Secondary | ICD-10-CM | POA: Diagnosis present

## 2020-06-07 DIAGNOSIS — E119 Type 2 diabetes mellitus without complications: Secondary | ICD-10-CM | POA: Diagnosis present

## 2020-06-07 DIAGNOSIS — S72492A Other fracture of lower end of left femur, initial encounter for closed fracture: Secondary | ICD-10-CM | POA: Diagnosis present

## 2020-06-07 DIAGNOSIS — I10 Essential (primary) hypertension: Secondary | ICD-10-CM

## 2020-06-07 DIAGNOSIS — F05 Delirium due to known physiological condition: Secondary | ICD-10-CM | POA: Diagnosis present

## 2020-06-07 DIAGNOSIS — F0280 Dementia in other diseases classified elsewhere without behavioral disturbance: Secondary | ICD-10-CM | POA: Diagnosis present

## 2020-06-07 DIAGNOSIS — G309 Alzheimer's disease, unspecified: Secondary | ICD-10-CM | POA: Diagnosis present

## 2020-06-07 DIAGNOSIS — Z993 Dependence on wheelchair: Secondary | ICD-10-CM | POA: Diagnosis not present

## 2020-06-07 DIAGNOSIS — Y92129 Unspecified place in nursing home as the place of occurrence of the external cause: Secondary | ICD-10-CM | POA: Diagnosis not present

## 2020-06-07 DIAGNOSIS — J690 Pneumonitis due to inhalation of food and vomit: Secondary | ICD-10-CM | POA: Diagnosis not present

## 2020-06-07 DIAGNOSIS — Z7901 Long term (current) use of anticoagulants: Secondary | ICD-10-CM | POA: Diagnosis not present

## 2020-06-07 DIAGNOSIS — Z7401 Bed confinement status: Secondary | ICD-10-CM | POA: Diagnosis not present

## 2020-06-07 DIAGNOSIS — S72012A Unspecified intracapsular fracture of left femur, initial encounter for closed fracture: Secondary | ICD-10-CM | POA: Diagnosis present

## 2020-06-07 DIAGNOSIS — S72402A Unspecified fracture of lower end of left femur, initial encounter for closed fracture: Secondary | ICD-10-CM

## 2020-06-07 DIAGNOSIS — S72002A Fracture of unspecified part of neck of left femur, initial encounter for closed fracture: Secondary | ICD-10-CM | POA: Diagnosis present

## 2020-06-07 DIAGNOSIS — Z20822 Contact with and (suspected) exposure to covid-19: Secondary | ICD-10-CM | POA: Diagnosis present

## 2020-06-07 DIAGNOSIS — I480 Paroxysmal atrial fibrillation: Secondary | ICD-10-CM | POA: Diagnosis present

## 2020-06-07 DIAGNOSIS — W19XXXA Unspecified fall, initial encounter: Secondary | ICD-10-CM | POA: Diagnosis present

## 2020-06-07 DIAGNOSIS — Z7984 Long term (current) use of oral hypoglycemic drugs: Secondary | ICD-10-CM | POA: Diagnosis not present

## 2020-06-07 LAB — CBC WITH DIFFERENTIAL/PLATELET
Abs Immature Granulocytes: 0.05 10*3/uL (ref 0.00–0.07)
Basophils Absolute: 0 10*3/uL (ref 0.0–0.1)
Basophils Relative: 0 %
Eosinophils Absolute: 0.2 10*3/uL (ref 0.0–0.5)
Eosinophils Relative: 2 %
HCT: 39.7 % (ref 39.0–52.0)
Hemoglobin: 12.3 g/dL — ABNORMAL LOW (ref 13.0–17.0)
Immature Granulocytes: 1 %
Lymphocytes Relative: 23 %
Lymphs Abs: 2.2 10*3/uL (ref 0.7–4.0)
MCH: 29.6 pg (ref 26.0–34.0)
MCHC: 31 g/dL (ref 30.0–36.0)
MCV: 95.7 fL (ref 80.0–100.0)
Monocytes Absolute: 0.8 10*3/uL (ref 0.1–1.0)
Monocytes Relative: 8 %
Neutro Abs: 6.5 10*3/uL (ref 1.7–7.7)
Neutrophils Relative %: 66 %
Platelets: 234 10*3/uL (ref 150–400)
RBC: 4.15 MIL/uL — ABNORMAL LOW (ref 4.22–5.81)
RDW: 13.9 % (ref 11.5–15.5)
WBC: 9.7 10*3/uL (ref 4.0–10.5)
nRBC: 0 % (ref 0.0–0.2)

## 2020-06-07 LAB — CBC
HCT: 38.6 % — ABNORMAL LOW (ref 39.0–52.0)
Hemoglobin: 12.1 g/dL — ABNORMAL LOW (ref 13.0–17.0)
MCH: 29.7 pg (ref 26.0–34.0)
MCHC: 31.3 g/dL (ref 30.0–36.0)
MCV: 94.6 fL (ref 80.0–100.0)
Platelets: 230 10*3/uL (ref 150–400)
RBC: 4.08 MIL/uL — ABNORMAL LOW (ref 4.22–5.81)
RDW: 13.8 % (ref 11.5–15.5)
WBC: 8.8 10*3/uL (ref 4.0–10.5)
nRBC: 0 % (ref 0.0–0.2)

## 2020-06-07 LAB — BASIC METABOLIC PANEL
Anion gap: 8 (ref 5–15)
Anion gap: 9 (ref 5–15)
BUN: 18 mg/dL (ref 8–23)
BUN: 18 mg/dL (ref 8–23)
CO2: 28 mmol/L (ref 22–32)
CO2: 28 mmol/L (ref 22–32)
Calcium: 8.8 mg/dL — ABNORMAL LOW (ref 8.9–10.3)
Calcium: 9.1 mg/dL (ref 8.9–10.3)
Chloride: 102 mmol/L (ref 98–111)
Chloride: 103 mmol/L (ref 98–111)
Creatinine, Ser: 0.74 mg/dL (ref 0.61–1.24)
Creatinine, Ser: 0.88 mg/dL (ref 0.61–1.24)
GFR, Estimated: >60 mL/min (ref >60)
GFR, Estimated: >60 mL/min (ref >60)
Glucose, Bld: 132 mg/dL — ABNORMAL HIGH (ref 70–99)
Glucose, Bld: 96 mg/dL (ref 70–99)
Potassium: 4 mmol/L (ref 3.5–5.1)
Potassium: 4.4 mmol/L (ref 3.5–5.1)
Sodium: 139 mmol/L (ref 135–145)
Sodium: 139 mmol/L (ref 135–145)

## 2020-06-07 LAB — GLUCOSE, CAPILLARY
Glucose-Capillary: 248 mg/dL — ABNORMAL HIGH (ref 70–99)
Glucose-Capillary: 194 mg/dL — ABNORMAL HIGH (ref 70–99)
Glucose-Capillary: 113 mg/dL — ABNORMAL HIGH (ref 70–99)
Glucose-Capillary: 134 mg/dL — ABNORMAL HIGH (ref 70–99)

## 2020-06-07 LAB — HEMOGLOBIN A1C
Hgb A1c MFr Bld: 6.1 % — ABNORMAL HIGH (ref 4.8–5.6)
Mean Plasma Glucose: 128.37 mg/dL

## 2020-06-07 MED ORDER — DONEPEZIL HCL 10 MG PO TABS
5.0000 mg | ORAL_TABLET | Freq: Every day | ORAL | Status: DC
  Administered 2020-06-08 – 2020-06-09 (×2): 5 mg via ORAL
  Filled 2020-06-07 (×2): qty 1

## 2020-06-07 MED ORDER — INSULIN ASPART 100 UNIT/ML ~~LOC~~ SOLN: 0.0000 [IU] | Freq: Every day | SUBCUTANEOUS | Status: DC

## 2020-06-07 MED ORDER — ENOXAPARIN SODIUM 40 MG/0.4ML ~~LOC~~ SOLN
40.0000 mg | SUBCUTANEOUS | Status: DC
  Administered 2020-06-07 – 2020-06-09 (×3): 40 mg via SUBCUTANEOUS
  Filled 2020-06-07 (×3): qty 0.4

## 2020-06-07 MED ORDER — TAMSULOSIN HCL 0.4 MG PO CAPS
0.4000 mg | ORAL_CAPSULE | Freq: Every day | ORAL | Status: DC
  Administered 2020-06-08 – 2020-06-09 (×2): 0.4 mg via ORAL
  Filled 2020-06-07 (×2): qty 1

## 2020-06-07 MED ORDER — HYDROMORPHONE HCL 1 MG/ML IJ SOLN: 0.5000 mg | INTRAMUSCULAR | Status: DC | PRN

## 2020-06-07 MED ORDER — SENNOSIDES-DOCUSATE SODIUM 8.6-50 MG PO TABS
2.0000 | ORAL_TABLET | Freq: Two times a day (BID) | ORAL | Status: DC
  Administered 2020-06-08 – 2020-06-10 (×5): 2 via ORAL
  Filled 2020-06-07 (×6): qty 2

## 2020-06-07 MED ORDER — CITALOPRAM HYDROBROMIDE 10 MG PO TABS
15.0000 mg | ORAL_TABLET | Freq: Every morning | ORAL | Status: DC
  Administered 2020-06-09 – 2020-06-10 (×2): 15 mg via ORAL
  Filled 2020-06-07 (×4): qty 2

## 2020-06-07 MED ORDER — MUPIROCIN 2 % EX OINT
1.0000 "application " | TOPICAL_OINTMENT | Freq: Two times a day (BID) | CUTANEOUS | Status: DC
  Administered 2020-06-07 – 2020-06-09 (×4): 1 via NASAL
  Filled 2020-06-07 (×3): qty 22

## 2020-06-07 MED ORDER — HYDROMORPHONE HCL 2 MG/ML IJ SOLN
INTRAMUSCULAR | Status: AC
  Administered 2020-06-07: 0.5 mg
  Filled 2020-06-07: qty 1

## 2020-06-07 MED ORDER — AMLODIPINE BESYLATE 5 MG PO TABS
5.0000 mg | ORAL_TABLET | Freq: Every morning | ORAL | Status: DC
  Administered 2020-06-08 – 2020-06-10 (×3): 5 mg via ORAL
  Filled 2020-06-07 (×3): qty 1

## 2020-06-07 MED ORDER — METFORMIN HCL 500 MG PO TABS
1000.0000 mg | ORAL_TABLET | Freq: Two times a day (BID) | ORAL | Status: DC
  Administered 2020-06-08 – 2020-06-09 (×3): 1000 mg via ORAL
  Filled 2020-06-07 (×3): qty 2

## 2020-06-07 MED ORDER — INSULIN ASPART 100 UNIT/ML ~~LOC~~ SOLN
0.0000 [IU] | Freq: Three times a day (TID) | SUBCUTANEOUS | Status: DC
  Administered 2020-06-07 – 2020-06-09 (×2): 2 [IU] via SUBCUTANEOUS

## 2020-06-07 MED ORDER — HYDROCODONE-ACETAMINOPHEN 5-325 MG PO TABS
1.0000 | ORAL_TABLET | Freq: Four times a day (QID) | ORAL | Status: DC | PRN
  Administered 2020-06-08 (×2): 1 via ORAL
  Filled 2020-06-07 (×2): qty 1

## 2020-06-07 MED ORDER — LACTATED RINGERS IV SOLN: INTRAVENOUS | Status: DC

## 2020-06-07 NOTE — Plan of Care (Signed)

## 2020-06-07 NOTE — ED Notes (Signed)
Report called to V, RN on 3W
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
## 2020-06-07 NOTE — Progress Notes (Signed)
IVT: Spoke to RN;stated pt was very agitated earlier,just now sleeping;ok for placing IV access later.

## 2020-06-07 NOTE — Progress Notes (Signed)
PT Cancellation Note  Patient Details Name: Jeffery Haynes MRN: [id number] DOB: 1931/05/25   Cancelled Treatment:     PT order received and eval attempted x 2 but deferred - RN reports pt extremely lethargic and not rousable enough to participate with PT at this time.  Will follow.   Zeynab Klett 06/07/2020, 2:54 PM

## 2020-06-07 NOTE — ED Provider Notes (Signed)
Blood pressure (!) 148/70, pulse 75, temperature 98.9 F (37.2 C), temperature source Oral, resp. rate 17, SpO2 96 %.  Assuming care from Dr. Wilkie Aye.  In short, Jeffery Haynes is a 87 y.o. male with a chief complaint of Leg Pain .  Refer to the original H&P for additional details.  The current plan of care is to admit following labs.  Discussed patient's case with [facility] to request admission. Patient and family (if present) updated with plan. Care transferred to [facility] service.  I reviewed all nursing notes, vitals, pertinent old records, EKGs, labs, imaging (as available).     Maia Plan, MD 06/07/20 [phone]

## 2020-06-07 NOTE — Plan of Care (Signed)
  Problem: Education: Goal: Knowledge of General Education information will improve Description: Including pain rating scale, medication(s)/side effects and non-pharmacologic comfort measures 06/07/2020 0517 by Quincy Sheehan, RN Outcome: Progressing 06/07/2020 0438 by Quincy Sheehan, RN Outcome: Progressing   Problem: Health Behavior/Discharge Planning: Goal: Ability to manage health-related needs will improve 06/07/2020 0517 by Quincy Sheehan, RN Outcome: Progressing 06/07/2020 0438 by Quincy Sheehan, RN Outcome: Progressing   Problem: Clinical Measurements: Goal: Ability to maintain clinical measurements within normal limits will improve 06/07/2020 0517 by Quincy Sheehan, RN Outcome: Progressing 06/07/2020 0438 by Quincy Sheehan, RN Outcome: Progressing Goal: Will remain free from infection 06/07/2020 0517 by Quincy Sheehan, RN Outcome: Progressing 06/07/2020 0438 by Quincy Sheehan, RN Outcome: Progressing Goal: Diagnostic test results will improve 06/07/2020 0517 by Quincy Sheehan, RN Outcome: Progressing 06/07/2020 0438 by Quincy Sheehan, RN Outcome: Progressing Goal: Respiratory complications will improve 06/07/2020 0517 by Quincy Sheehan, RN Outcome: Progressing 06/07/2020 0438 by Quincy Sheehan, RN Outcome: Progressing Goal: Cardiovascular complication will be avoided 06/07/2020 0517 by Quincy Sheehan, RN Outcome: Progressing 06/07/2020 0438 by Quincy Sheehan, RN Outcome: Progressing   Problem: Activity: Goal: Risk for activity intolerance will decrease 06/07/2020 0517 by Quincy Sheehan, RN Outcome: Progressing 06/07/2020 0438 by Quincy Sheehan, RN Outcome: Progressing   Problem: Nutrition: Goal: Adequate nutrition will be maintained 06/07/2020 0517 by Quincy Sheehan, RN Outcome: Progressing 06/07/2020 0438 by Quincy Sheehan, RN Outcome: Progressing   Problem: Coping: Goal: Level of anxiety will  decrease 06/07/2020 0517 by Quincy Sheehan, RN Outcome: Progressing 06/07/2020 0438 by Quincy Sheehan, RN Outcome: Progressing   Problem: Elimination: Goal: Will not experience complications related to bowel motility 06/07/2020 0517 by Quincy Sheehan, RN Outcome: Progressing 06/07/2020 0438 by Quincy Sheehan, RN Outcome: Progressing Goal: Will not experience complications related to urinary retention 06/07/2020 0517 by Quincy Sheehan, RN Outcome: Progressing 06/07/2020 0438 by Quincy Sheehan, RN Outcome: Progressing   Problem: Pain Managment: Goal: General experience of comfort will improve 06/07/2020 0517 by Quincy Sheehan, RN Outcome: Progressing 06/07/2020 0438 by Quincy Sheehan, RN Outcome: Progressing   Problem: Safety: Goal: Ability to remain free from injury will improve 06/07/2020 0517 by Quincy Sheehan, RN Outcome: Progressing 06/07/2020 0438 by Quincy Sheehan, RN Outcome: Progressing   Problem: Skin Integrity: Goal: Risk for impaired skin integrity will decrease 06/07/2020 0517 by Quincy Sheehan, RN Outcome: Progressing 06/07/2020 0438 by Quincy Sheehan, RN Outcome: Progressing
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
## 2020-06-07 NOTE — Consult Note (Signed)
Orthopaedic Trauma Service (OTS) Consult   Patient ID: Quentin Shorey MRN: [id number] DOB/AGE: [id number] y.o.  Reason for Consult: Left femur fractures Referring Physician: Dr. Ross Marcus, MD [facility] emergency department)  HPI: Yuma Pacella is an 87 y.o. male DMT2, HTN, paroxysmal atrial fibrillation, Alzheimer's dementia being seen in consultation request of Dr. Wilkie Aye for left femur fractures.  Patient presented to [facility] emergency department with complaints of left thigh and hip pain on 06/06/2020 after a fall at [facility].  Imaging in the emergency department showed impacted femoral neck fracture as well as nondisplaced distal femur fracture.  Orthopedics was consulted for evaluation and management.  Patient seen this morning on orthopedic floor.  Patient noted to be very combative overnight, but he is currently resting comfortably in bed.  Is normally only oriented to himself at baseline.  Unable to obtain any additional information from patient.  Per patient's chart, he is on Eliquis for atrial fibrillation  Past Medical History:  Diagnosis Date  . Alzheimer disease (HCC)   . Dementia (HCC)   . Diabetes mellitus without complication (HCC)     History reviewed. No pertinent surgical history.  Family History  Problem Relation Age of Onset  . Seizures Neg Hx     Social History:  has no history on file for tobacco use, alcohol use, and drug use.  Allergies:  Allergies  Allergen Reactions  . Olanzapine     Possible neuroleptic malignant syndrome    Medications:  I have reviewed the patient's current medications. Prior to Admission:  Medications Prior to Admission  Medication Sig Dispense Refill Last Dose  . acetaminophen (TYLENOL) 325 MG tablet Take 650 mg by mouth See admin instructions. Take 650 mg by mouth three times a day and an additional 650 mg every six hours as needed for pain   unk at unk  . amLODipine (NORVASC) 5 MG tablet Take 5 mg by mouth in the  morning.   06/06/2020 at 0800  . apixaban (ELIQUIS) 5 MG TABS tablet Take 1 tablet (5 mg total) by mouth 2 (two) times daily.   06/06/2020 at 0900  . citalopram (CELEXA) 10 MG tablet Take 15 mg by mouth in the morning.   06/06/2020 at 0900  . diclofenac Sodium (VOLTAREN) 1 % GEL Apply 2 g topically See admin instructions. Apply 2 grams to both shoulders two times a day   06/06/2020 at 0900  . divalproex (DEPAKOTE SPRINKLE) 125 MG capsule Take 250 mg by mouth in the morning and at bedtime.   06/06/2020 at 0900  . donepezil (ARICEPT) 5 MG tablet Take 1 tablet (5 mg total) by mouth at bedtime.   06/05/2020 at 2100  . metFORMIN (GLUCOPHAGE) 500 MG tablet Take 1,000 mg by mouth 2 (two) times daily.   06/06/2020 at 0800  . Multiple Vitamins-Minerals (MULTIVITAMIN WITH MINERALS) tablet Take 1 tablet by mouth daily.   06/06/2020 at 0900  . NONFORMULARY OR COMPOUNDED ITEM Apply 1 application topically See admin instructions. ABH compounded gel: Lorazepam 0.5 mg/Diphenhydramine HCl 25 mg/Haloperidol 0.5 mg/ml and Lipoderm gel- Apply to skin every 12 hours AND an additional application every 8 hours as needed for restlessness or agitation   06/06/2020 at 0900  . OLANZapine (ZYPREXA) 2.5 MG tablet Take 2.5 mg by mouth at bedtime.   06/05/2020 at 2100  . polyethylene glycol powder (GLYCOLAX/MIRALAX) 17 GM/SCOOP powder Take 17 g by mouth daily.   06/06/2020 at 0800  . potassium chloride (KLOR-CON) 10 MEQ tablet Take 1  tablet (10 mEq total) by mouth daily. 15 tablet 0 06/06/2020 at 0900  . sennosides-docusate sodium (SENOKOT-S) 8.6-50 MG tablet Take 2 tablets by mouth 2 (two) times daily.   06/06/2020 at 0900  . tamsulosin (FLOMAX) 0.4 MG CAPS capsule Take 0.4 mg by mouth at bedtime.   06/05/2020 at 2000    ROS: Unable to obtain due to advanced dementia  Exam: Blood pressure 132/78, pulse 73, temperature 98.5 F (36.9 C), temperature source Oral, resp. rate 15, height 6' (1.829 m), weight 64.4 kg, SpO2 (!) 84 %. General:  Resting in bed comfortably, no acute distress Orientation: Oriented to himself Mood and affect: Calm and comfortable currently Gait: Not assessed Coordination and balance: Within normal limits  Left lower extremity: Leg held in slight external rotation.  No tenderness with palpation about the hip.  Winces slightly with palpation through the thigh as well as gentle rotation of the extremity.  Tolerates passive dorsiflexion and plantarflexion of his ankle.  Unable to obtain reliable motor or sensory exam due to advanced dementia.  Compartments are soft and compressible.  Skin warm and dry.  + DP pulse  Right lower extremity: Skin without lesions. No obvious tenderness to palpation.  Tolerates gentle passive range of motion of the extremity with no visual signs of discomfort.  Unable to obtain reliable motor or sensory exam.  Skin warm and dry. + DP pulse  Medical Decision Making: Data: Imaging: -  X-ray and CT scan left hip reveal slight cortical irregularity along the lateral aspect of the femoral neck, likely consistent with incomplete subcapital femoral neck fracture.    - AP and lateral views of the left knee show nondisplaced fracture of the distal femur with no intra-articular extension.  Degenerative changes noted in both the medial and lateral joint space.  Labs:  Results for orders placed or performed during the hospital encounter of 06/06/20 (from the past 24 hour(s))  Resp Panel by RT-PCR (Flu A&B, Covid) Nasopharyngeal Swab     Status: None   Collection Time: 06/06/20 10:11 PM   Specimen: Nasopharyngeal Swab; Nasopharyngeal(NP) swabs in vial transport medium  Result Value Ref Range   SARS Coronavirus 2 by RT PCR NEGATIVE NEGATIVE   Influenza A by PCR NEGATIVE NEGATIVE   Influenza B by PCR NEGATIVE NEGATIVE  CBC with Differential     Status: Abnormal   Collection Time: 06/06/20 11:30 PM  Result Value Ref Range   WBC 9.7 4.0 - 10.5 K/uL   RBC 4.15 (L) 4.22 - 5.81 MIL/uL    Hemoglobin 12.3 (L) 13.0 - 17.0 g/dL   HCT 07.6 80.8 - 81.1 %   MCV 95.7 80.0 - 100.0 fL   MCH 29.6 26.0 - 34.0 pg   MCHC 31.0 30.0 - 36.0 g/dL   RDW 03.1 59.4 - 58.5 %   Platelets 234 150 - 400 K/uL   nRBC 0.0 0.0 - 0.2 %   Neutrophils Relative % 66 %   Neutro Abs 6.5 1.7 - 7.7 K/uL   Lymphocytes Relative 23 %   Lymphs Abs 2.2 0.7 - 4.0 K/uL   Monocytes Relative 8 %   Monocytes Absolute 0.8 0.1 - 1.0 K/uL   Eosinophils Relative 2 %   Eosinophils Absolute 0.2 0.0 - 0.5 K/uL   Basophils Relative 0 %   Basophils Absolute 0.0 0.0 - 0.1 K/uL   Immature Granulocytes 1 %   Abs Immature Granulocytes 0.05 0.00 - 0.07 K/uL  Basic metabolic panel     Status:  None   Collection Time: 06/06/20 11:30 PM  Result Value Ref Range   Sodium 139 135 - 145 mmol/L   Potassium 4.4 3.5 - 5.1 mmol/L   Chloride 103 98 - 111 mmol/L   CO2 28 22 - 32 mmol/L   Glucose, Bld 96 70 - 99 mg/dL   BUN 18 8 - 23 mg/dL   Creatinine, Ser 2.99 0.61 - 1.24 mg/dL   Calcium 9.1 8.9 - 37.1 mg/dL   GFR, Estimated >69 >67 mL/min   Anion gap 8 5 - 15  CBC     Status: Abnormal   Collection Time: 06/07/20  3:27 AM  Result Value Ref Range   WBC 8.8 4.0 - 10.5 K/uL   RBC 4.08 (L) 4.22 - 5.81 MIL/uL   Hemoglobin 12.1 (L) 13.0 - 17.0 g/dL   HCT 89.3 (L) 81.0 - 17.5 %   MCV 94.6 80.0 - 100.0 fL   MCH 29.7 26.0 - 34.0 pg   MCHC 31.3 30.0 - 36.0 g/dL   RDW 10.2 58.5 - 27.7 %   Platelets 230 150 - 400 K/uL   nRBC 0.0 0.0 - 0.2 %  Basic metabolic panel     Status: Abnormal   Collection Time: 06/07/20  3:27 AM  Result Value Ref Range   Sodium 139 135 - 145 mmol/L   Potassium 4.0 3.5 - 5.1 mmol/L   Chloride 102 98 - 111 mmol/L   CO2 28 22 - 32 mmol/L   Glucose, Bld 132 (H) 70 - 99 mg/dL   BUN 18 8 - 23 mg/dL   Creatinine, Ser 8.24 0.61 - 1.24 mg/dL   Calcium 8.8 (L) 8.9 - 10.3 mg/dL   GFR, Estimated >23 >53 mL/min   Anion gap 9 5 - 15  Glucose, capillary     Status: Abnormal   Collection Time: 06/07/20  7:25 AM   Result Value Ref Range   Glucose-Capillary 248 (H) 70 - 99 mg/dL    Assessment/Plan: 87 year old male with advanced dementia status post fall at SNF, resulting in incomplete left femoral neck fracture and nondisplaced left distal femur fracture  I feel that both of these fractures are stable and would do well with nonoperative treatment.  Risks and benefits of surgical versus nonsurgical intervention were discussed with the patient's wife [name]) via telephone this morning.  After in-depth discussion, patient's wife would like to trial nonoperative management today. We will have him mobilize with physical and occupational therapy. Would recommend partial weightbearing 50% on the left lower extremity.  We will plan to follow-up with patient tomorrow once he mobilizes with therapies and reassess pain at that point.  If he is mobilizing well without significant pain, we will plan to continue with nonoperative treatment as definitive management.  If he continues have significant pain when mobilizing, patient's wife would like to further discuss surgery. We will allow him a diet today. Continue to hold Eliquis until definitve management is determined.   Sarah A. Ladonna Snide [facility] [phone] (office) [email or web address]

## 2020-06-07 NOTE — H&P (Signed)
History and Physical    Jeffery Haynes RJJ:[id number] DOB: 1931-05-04 DOA: 06/06/2020  PCP: Patient, No Pcp Per   Patient coming from: SNF  Chief Complaint: pain in left knee and hip after fall at facility.   HPI: Jeffery Haynes is a 87 y.o. male with medical history significant for DMT2, HTN, paroxysmal atrial fibrillation, Alzheimer's dementia who presents from home SNF after a fall yesterday.  Reportedly patient is normally oriented to himself at baseline.  He complained of pain in his left thigh and hip at the facility and was not able to stand so he was sent to the hospital for evaluation.  He was found to have a femoral neck fracture on the left and a distal femur fracture on the left.  There is no report of any loss of consciousness headache, head injury, nausea, vomiting, diarrhea.  Patient unable to give any history on his own.  Family is not present.  Reportedly the ER physician discussed with the family earlier that the patient did have a hip fracture and the family wanted to have orthopedic evaluation and discuss treatment options with orthopedics before planning or consenting to surgery.   Review of Systems:  Review of system cannot be obtained secondary to dementia  Past Medical History:  Diagnosis Date  . Alzheimer disease (HCC)   . Dementia (HCC)   . Diabetes mellitus without complication (HCC)     History reviewed. No pertinent surgical history.  Social History  has no history on file for tobacco use, alcohol use, and drug use.  Allergies  Allergen Reactions  . Olanzapine     Possible neuroleptic malignant syndrome    Family History  Problem Relation Age of Onset  . Seizures Neg Hx      Prior to Admission medications   Medication Sig Start Date End Date Taking? Authorizing Provider  acetaminophen (TYLENOL) 325 MG tablet Take 650 mg by mouth See admin instructions. Take 650 mg by mouth three times a day and an additional 650 mg every six hours as needed  for pain    [provider]  amLODipine (NORVASC) 5 MG tablet Take 5 mg by mouth in the morning.    [provider]  apixaban (ELIQUIS) 5 MG TABS tablet Take 1 tablet (5 mg total) by mouth 2 (two) times daily. 02/01/20 01/31/21  Pokhrel, Rebekah Chesterfield, MD  citalopram (CELEXA) 10 MG tablet Take 15 mg by mouth in the morning.    [provider]  diclofenac Sodium (VOLTAREN) 1 % GEL Apply 2 g topically See admin instructions. Apply 2 grams to both shoulders two times a day    [provider]  divalproex (DEPAKOTE SPRINKLE) 125 MG capsule Take 250 mg by mouth in the morning and at bedtime.    [provider]  donepezil (ARICEPT) 5 MG tablet Take 1 tablet (5 mg total) by mouth at bedtime. 02/01/20 01/31/21  Pokhrel, Rebekah Chesterfield, MD  metFORMIN (GLUCOPHAGE) 500 MG tablet Take 1,000 mg by mouth 2 (two) times daily.    [provider]  Multiple Vitamins-Minerals (MULTIVITAMIN WITH MINERALS) tablet Take 1 tablet by mouth daily.    [provider]  NONFORMULARY OR COMPOUNDED ITEM Apply 1 application topically See admin instructions. ABH compounded gel: Lorazepam 0.5 mg/Diphenhydramine HCl 25 mg/Haloperidol 0.5 mg/ml and Lipoderm gel- Apply to skin every 12 hours AND an additional application every 8 hours as needed for restlessness or agitation    [provider]  polyethylene glycol powder (GLYCOLAX/MIRALAX) 17 GM/SCOOP powder Take  17 g by mouth daily.    [provider]  potassium chloride (KLOR-CON) 10 MEQ tablet Take 1 tablet (10 mEq total) by mouth daily. 02/01/20   Pokhrel, Rebekah Chesterfield, MD  sennosides-docusate sodium (SENOKOT-S) 8.6-50 MG tablet Take 2 tablets by mouth 2 (two) times daily.    [provider]  tamsulosin (FLOMAX) 0.4 MG CAPS capsule Take 0.4 mg by mouth at bedtime.    [provider]    Physical Exam: Vitals:   06/06/20 2329 06/07/20 0000 06/07/20 0030 06/07/20 0130  BP: (!) 148/70 140/65 (!) 153/78 (!)  163/78  Pulse: 75 77 79 76  Resp: 17 19 16 17   Temp:      TempSrc:      SpO2: 96% 94% 93% 100%    Constitutional: NAD, calm, comfortable Vitals:   06/06/20 2329 06/07/20 0000 06/07/20 0030 06/07/20 0130  BP: (!) 148/70 140/65 (!) 153/78 (!) 163/78  Pulse: 75 77 79 76  Resp: 17 19 16 17   Temp:      TempSrc:      SpO2: 96% 94% 93% 100%   General: WDWN, sleeping but will open eyes to voice.  Does not verbally respond..  Eyes: PERRL, conjunctivae normal.  Sclera nonicteric HENT:  [place]/AT, external ears normal.  Nares patent without epistasis.  Mucous membranes are moist. Neck: Soft, normal range of motion, supple, no masses,Trachea midline Respiratory: clear to auscultation bilaterally, no wheezing, no crackles. Normal respiratory effort. No accessory muscle use.  Cardiovascular: Regular rate and rhythm, no murmurs / rubs / gallops. No extremity edema. 2+ pedal pulses. Abdomen: Soft, no tenderness, nondistended, no rebound or guarding.  No masses palpated. No hepatosplenomegaly. Bowel sounds normoactive Musculoskeletal: Normal range of motion of upper extremities passively.  Normal range of motion of right lower extremity.  Patient winces of left thigh are palpated.  No cyanosis. Normal muscle tone.  Skin: Warm, dry, intact no rashes, lesions, ulcers. No induration Neurologic: Limited exam secondary to dementia.  Withdraws from painful stimuli.  Opens eyes to voice.   Labs on Admission: I have personally reviewed following labs and imaging studies  CBC: Recent Labs  Lab 06/06/20 2330  WBC 9.7  NEUTROABS 6.5  HGB 12.3*  HCT 39.7  MCV 95.7  PLT 234    Basic Metabolic Panel: Recent Labs  Lab 06/06/20 2330  NA 139  K 4.4  CL 103  CO2 28  GLUCOSE 96  BUN 18  CREATININE 0.88  CALCIUM 9.1    GFR: CrCl cannot be calculated (Unknown ideal weight.).  Liver Function Tests: No results for input(s): AST, ALT, ALKPHOS, BILITOT, PROT, ALBUMIN in the last 168 hours.  Urine  analysis:    Component Value Date/Time   COLORURINE STRAW (A) 01/29/2020 1228   APPEARANCEUR CLEAR 01/29/2020 1228   LABSPEC 1.012 01/29/2020 1228   PHURINE 7.0 01/29/2020 1228   GLUCOSEU >=500 (A) 01/29/2020 1228   HGBUR NEGATIVE 01/29/2020 1228   BILIRUBINUR NEGATIVE 01/29/2020 1228   KETONESUR 5 (A) 01/29/2020 1228   PROTEINUR NEGATIVE 01/29/2020 1228   NITRITE NEGATIVE 01/29/2020 1228   LEUKOCYTESUR NEGATIVE 01/29/2020 1228    Radiological Exams on Admission: CT HIP LEFT WO CONTRAST  Result Date: 06/06/2020 CLINICAL DATA:  Left leg pain following fall with findings suggestive of subcapital femoral neck fracture on plain film EXAM: CT OF THE LEFT HIP WITHOUT CONTRAST TECHNIQUE: Multidetector CT imaging of the left hip was performed according to the standard protocol. Multiplanar CT image reconstructions were also generated. COMPARISON:  None. FINDINGS: Bones/Joint/Cartilage Diffuse osteoporosis is noted the cortical irregularity on the lateral aspect of the femoral neck in the subcapital region is noted similar to that seen on prior plain film. These changes are highly suspicious for an incomplete subcapital femoral neck fracture. No other femur fracture is seen. The remainder of the pelvic structures show no acute fracture. Degenerative changes of the lumbar spine are noted. Ligaments Suboptimally assessed by CT. Muscles and Tendons Surrounding musculature is within normal limits. Soft tissues Soft tissue structures of the pelvis and upper left thigh show no acute abnormality. IMPRESSION: Cortical irregularity along the lateral aspect of the femoral neck similar to that seen on prior plain film examination. These changes are highly suspicious for incomplete subcapital femoral neck fracture. No other fractures are seen. Limited hip protocol MRI may be helpful for further evaluation. No other focal abnormality is seen. Electronically Signed   By: Alcide Clever M.D.   On: 06/06/2020 22:58   DG  Knee Complete 4 Views Left  Result Date: 06/06/2020 CLINICAL DATA:  Recent fall with left knee pain, initial encounter EXAM: LEFT KNEE - COMPLETE 4+ VIEW COMPARISON:  None. FINDINGS: There is an undisplaced distal femoral fracture involving the metaphysis without significant extension into the joint space. Degenerative changes of the knee joint are seen. Small joint effusion is noted. IMPRESSION: Undisplaced distal femoral metaphyseal fracture. Electronically Signed   By: Alcide Clever M.D.   On: 06/06/2020 20:28   DG Hip Unilat W or Wo Pelvis 2-3 Views Left  Result Date: 06/06/2020 CLINICAL DATA:  Fall yesterday, pain EXAM: DG HIP (WITH OR WITHOUT PELVIS) 2-3V LEFT COMPARISON:  None. FINDINGS: Technically very limited radiographs. Osteopenia. Impacted subcapital fracture of the left femoral neck. No other obvious fracture of the pelvis or proximal right femur. IMPRESSION: Impacted subcapital fracture of the left femoral neck. No other obvious fracture of the pelvis or proximal right femur. Osteopenia. Electronically Signed   By: Lauralyn Primes M.D.   On: 06/06/2020 20:28    EKG: Independently reviewed.  EKG shows normal sinus rhythm with PAC.  No acute ST elevation or depression.  QTc 4 5  Assessment/Plan Principal Problem:   Fracture of femoral neck, left, closed  Mr. Bures is admitted to MedSurg floor.  Patient had a fall at his facility and sustained fracture of the left femoral neck as well as a distal femur fracture orthopedic surgeries been consulted and will see patient in the morning to make recommendations and discuss treatment options with family.  There is no tentative surgery scheduled at this time and family informed the ER physician that they wanted to discuss options with her orthopedic surgery prior to consenting to surgery. Pain control provided. IV fluid hydration with LR at 100 ml/hr overnight  Active Problems:   Closed fracture of left distal femur  Orthopedics consulted and  will evaluate patient and provide recommendations     Essential hypertension Continue home dose of Norvasc.  Monitor blood pressure    Diabetes mellitus type 2 in nonobese  Continue home dose of metformin.  Heart healthy low-carb diet ordered. Blood sugars with meals and at bedtime.  Sliding scale insulin with sensitive scale provided for glycemic control as needed.  Check hemoglobin A1c    Alzheimer's dementia  Continue home medications of Aricept and Namenda.     DVT prophylaxis:     Lovenox for DVT prophylaxis Code Status:   DNR Family Communication:  No family at bedside.  No answer when called  Disposition Plan:   Patient is from:  SNF  Anticipated DC to:  SNF  Anticipated DC date:  Anticipate more than 2 midnight stay in the hospital  Anticipated DC barriers: No barriers to discharge identified at this time  Consults called:  Orthopedics consulted by ER physician and will see patient in the morning Admission status:  Inpatient   Claudean SeveranceBradley S Chotiner MD [facility]  How to contact the [facility] Attending or Consulting provider 7A - 7P or covering provider during after hours 7P -7A, for this patient?   1. Check the care team in [facility] and look for a) attending/consulting [facility] provider listed and b) the [facility] team listed 2. Log into [email or web address] and use [place]'s universal password to access. If you do not have the password, please contact the hospital operator. 3. Locate the [facility] provider you are looking for under [facility] and page to a number that you can be directly reached. 4. If you still have difficulty reaching the provider, please page the [facility] (Director on Call) for the Hospitalists listed on amion for assistance.  06/07/2020, 1:45 AM

## 2020-06-07 NOTE — Plan of Care (Signed)
?  Problem: Clinical Measurements: ?Goal: Respiratory complications will improve ?Outcome: Progressing ?  ?Problem: Elimination: ?Goal: Will not experience complications related to urinary retention ?Outcome: Progressing ?  ?

## 2020-06-07 NOTE — Progress Notes (Signed)
She was admitted early this morning.  This is a 87 year old male admitted from [facility] with history of dementia type 2 diabetes hypertension and paroxysmal A. fib status post fall with left hip fracture.  Patient had a very rough night was confused agitated delirious he received Ativan and Dilaudid currently he is sleeping and appears comfortable.  On oxygen at 3 L as he became hypoxic. Labs okay Ortho consulted await recommendations

## 2020-06-07 NOTE — TOC Initial Note (Signed)
Transition of Care [facility]) - Initial/Assessment Note    Patient Details  Name: Jeffery Haynes MRN: [id number] Date of Birth: [birth year]  Transition of Care [facility]) CM/SW Contact:    Liliana Cline, LCSW Phone Number: 06/07/2020, 9:49 AM  Clinical Narrative:      CSW spoke with pt's wife who reports pt has resided at [facility] for 2-3 years. Pt's wife is anticipating his return at discharge and is en route from [facility] (1.5-2 hours away) to see pt. Per wife; "they will decide about surgery today".  CSW also spoke with Tresa Endo at [facility] who reports pt can return there at dc and is in their long term care/Medicaid unit.  CSW will ask weekday CSW to follow along to assist with support and dc planning further back to SNF once appropriate.   Reece Levy, MSW, LCSW Weekend Coverage              Expected Discharge Plan: Skilled Nursing Facility Barriers to Discharge: Continued Medical Work up   Patient Goals and CMS Choice Patient states their goals for this hospitalization and ongoing recovery are:: Return to SNF bed at [facility] per wife      Expected Discharge Plan and Services Expected Discharge Plan: Skilled Nursing Facility In-house Referral: Clinical Social Work     Living arrangements for the past 2 months: Skilled Nursing Facility                 DME Arranged: N/A DME Agency: NA                  Prior Living Arrangements/Services Living arrangements for the past 2 months: Skilled Nursing Facility Lives with:: Facility Resident Patient language and need for interpreter reviewed:: Yes Do you feel safe going back to the place where you live?: Yes      Need for Family Participation in Patient Care: No (Comment) Care giver support system in place?: Yes (comment)   Criminal Activity/Legal Involvement Pertinent to Current Situation/Hospitalization: No - Comment as needed  Activities of Daily Living Home Assistive Devices/Equipment:  Other (Comment) (pt unable to answer) ADL Screening (condition at time of admission) Patient's cognitive ability adequate to safely complete daily activities?: No Is the patient deaf or have difficulty hearing?:  (unknown, pt unable to answer) Does the patient have difficulty seeing, even when wearing glasses/contacts?:  (unknown, pt unable to answer) Does the patient have difficulty concentrating, remembering, or making decisions?: Yes Patient able to express need for assistance with ADLs?: No Does the patient have difficulty dressing or bathing?: Yes Independently performs ADLs?: No Communication: Needs assistance Is this a change from baseline?: Pre-admission baseline Dressing (OT): Needs assistance Is this a change from baseline?: Pre-admission baseline Grooming: Needs assistance Is this a change from baseline?: Pre-admission baseline Feeding: Needs assistance Is this a change from baseline?: Pre-admission baseline Bathing: Needs assistance Is this a change from baseline?: Pre-admission baseline Toileting: Needs assistance Is this a change from baseline?: Pre-admission baseline In/Out Bed: Needs assistance Is this a change from baseline?: Pre-admission baseline Walks in Home: Dependent Is this a change from baseline?: Pre-admission baseline Does the patient have difficulty walking or climbing stairs?: Yes Weakness of Legs: Both Weakness of Arms/Hands: Both  Permission Sought/Granted Permission sought to share information with : Family Supports Permission granted to share information with : Yes, Verbal Permission Granted  Share Information with NAME: Wife- Mrs Sedano  Permission granted to share info w [facility]  Emotional Assessment Appearance:: Appears stated age Attitude/Demeanor/Rapport: Unable to Assess Affect (typically observed): Unable to Assess Orientation: : Fluctuating Orientation (Suspected and/or reported Sundowners) Alcohol / Substance  Use: Not Applicable Psych Involvement: No (comment)  Admission diagnosis:  Fracture of femoral neck, left (HCC) [S72.002A] Closed fracture of neck of left femur, initial encounter (HCC) [S72.002A] Closed fracture of distal end of femur, unspecified fracture morphology, initial encounter [facility]) [S72.409A] Patient Active Problem List   Diagnosis Date Noted  . Alzheimer's dementia (HCC) 06/07/2020  . Fracture of femoral neck, left, closed (HCC) 06/07/2020  . Closed fracture of left distal femur (HCC) 06/07/2020  . Essential hypertension 06/07/2020  . Diabetes mellitus type 2 in nonobese (HCC) 06/07/2020  . Fracture of femoral neck, left (HCC) 06/07/2020  . Acute metabolic encephalopathy 01/29/2020  . Atrial fibrillation with RVR (HCC) 01/29/2020  . Alzheimer disease (HCC)   . Dementia (HCC)    PCP:  Patient, No Pcp Per Pharmacy:  No Pharmacies Listed    Social Determinants of Health (SDOH) Interventions    Readmission Risk Interventions No flowsheet data found.

## 2020-06-07 NOTE — ED Notes (Signed)
Noted patient with dementia, and spitting out pills. Placed per charge RN Onalee Hua rm [phone]

## 2020-06-07 NOTE — Progress Notes (Signed)
Orthopedic Tech Progress Note Patient Details:  Jeffery Haynes Dec 29, 1931 [id number]  Ortho Devices Type of Ortho Device: Knee Immobilizer Ortho Device/Splint Location: left Ortho Device/Splint Interventions: Application   Post Interventions Patient Tolerated: Well Instructions Provided: Care of device   Saul Fordyce 06/07/2020, 1:32 PM

## 2020-06-08 LAB — GLUCOSE, CAPILLARY
Glucose-Capillary: 88 mg/dL (ref 70–99)
Glucose-Capillary: 142 mg/dL — ABNORMAL HIGH (ref 70–99)
Glucose-Capillary: 119 mg/dL — ABNORMAL HIGH (ref 70–99)
Glucose-Capillary: 108 mg/dL — ABNORMAL HIGH (ref 70–99)

## 2020-06-08 LAB — VITAMIN D 25 HYDROXY (VIT D DEFICIENCY, FRACTURES): Vit D, 25-Hydroxy: 37.46 ng/mL (ref 30–100)

## 2020-06-08 MED ORDER — LORAZEPAM 2 MG/ML IJ SOLN
0.5000 mg | Freq: Every day | INTRAMUSCULAR | Status: DC
  Administered 2020-06-08 – 2020-06-09 (×2): 0.5 mg via INTRAVENOUS
  Filled 2020-06-08 (×2): qty 1

## 2020-06-08 MED ORDER — ACETAMINOPHEN 325 MG PO TABS
650.0000 mg | ORAL_TABLET | Freq: Four times a day (QID) | ORAL | Status: DC
  Administered 2020-06-08 – 2020-06-10 (×8): 650 mg via ORAL
  Filled 2020-06-08 (×8): qty 2

## 2020-06-08 MED ORDER — GLUCERNA SHAKE PO LIQD
237.0000 mL | Freq: Three times a day (TID) | ORAL | Status: DC
  Administered 2020-06-08 – 2020-06-09 (×3): 237 mL via ORAL
  Filled 2020-06-08 (×4): qty 237

## 2020-06-08 MED ORDER — LIDOCAINE 5 % EX PTCH
1.0000 | MEDICATED_PATCH | Freq: Every day | CUTANEOUS | Status: DC
  Administered 2020-06-08 – 2020-06-09 (×2): 1 via TRANSDERMAL
  Filled 2020-06-08 (×4): qty 1

## 2020-06-08 MED ORDER — ACETAMINOPHEN 325 MG PO TABS: 325.0000 mg | ORAL_TABLET | Freq: Four times a day (QID) | ORAL | Status: DC | PRN

## 2020-06-08 NOTE — Evaluation (Signed)
Occupational Therapy Evaluation Patient Details Name: Jeffery Haynes MRN: [id number] DOB: 04/12/1931 Today's Date: 06/08/2020    History of Present Illness 87 year old male with advanced dementia status post fall at SNF, resulting in incomplete left femoral neck fracture and nondisplaced left distal femur fracture. Currently patient being treated nonoperatively with left knee immobilizer.   Clinical Impression   Jeffery Haynes is an 87 year old man who presents with above pertinent medical history. On evaluation patient pleasant, able to state name and report pain in left lower extremity as well as make polite conversation. Patient not oriented to time or place. Patient able to use upper extremities to assist with feeding and grooming - needing set up and supervision for verbal cues to perform task. Patient max - total assist for all other ADLs at bed level. Patient total assist for bed mobility - does not attempt to physically assist with movement. Is able to to report pain in LLE. Patient exhibits posterior and left lateral lean at edge of bed needing external assistance to maintain balance. Did somewhat improve with placement of hands on an anterior surface. Patient quickly fatigued and returned to bed. In regards to Occupational Therapy services - patient near baseline - able to assist with upper extremities with multimodal cues for grooming and feeding but otherwise max - total assist. Per reports patient is bed bound at baseline - and with patient's lack of assistance with transfer - appears to be so. From on OT stand point patient at baseline with no therapy needs. Recommend return to nursing facility wih 24/7 care.    Follow Up Recommendations  SNF    Equipment Recommendations  None recommended by OT    Recommendations for Other Services       Precautions / Restrictions Precautions Precautions: Fall Required Braces or Orthoses: Knee Immobilizer - Left Knee Immobilizer - Left:  On at all times Restrictions Weight Bearing Restrictions: Yes LLE Weight Bearing: Partial weight bearing LLE Partial Weight Bearing Percentage or Pounds: 50%      Mobility Bed Mobility Overal bed mobility: Needs Assistance Bed Mobility: Supine to Sit;Sit to Supine     Supine to sit: Total assist;+2 for physical assistance;+2 for safety/equipment;HOB elevated Sit to supine: +2 for physical assistance;+2 for safety/equipment;Total assist   General bed mobility comments: Total assist for transfers. No attempts from patient to assist wtih any movement.    Transfers                 General transfer comment: unable    Balance Overall balance assessment: History of Falls;Needs assistance Sitting-balance support: Bilateral upper extremity supported;Feet supported Sitting balance-Leahy Scale: Poor Sitting balance - Comments: Reliant on upper extermities and external assist. Postural control: Left lateral lean;Posterior lean                                 ADL either performed or assessed with clinical judgement   ADL Overall ADL's : Needs assistance/impaired Eating/Feeding: Set up   Grooming: Set up;Cueing for sequencing;Bed level   Upper Body Bathing: Maximal assistance;Bed level   Lower Body Bathing: Maximal assistance;Bed level   Upper Body Dressing : Bed level;Moderate assistance;Cueing for sequencing   Lower Body Dressing: Total assistance;Bed level     Toilet Transfer Details (indicate cue type and reason): unable Toileting- Clothing Manipulation and Hygiene: Total assistance;Bed level Toileting - Clothing Manipulation Details (indicate cue type and reason): incontinent  Vision   Vision Assessment?: No apparent visual deficits     Perception     Praxis      Pertinent Vitals/Pain Pain Assessment: Faces Faces Pain Scale: Hurts a little bit Pain Location: LLE (knee, foot) Pain Descriptors / Indicators:  Grimacing;Guarding Pain Intervention(s): Monitored during session;Limited activity within patient's tolerance;Premedicated before session     Hand Dominance Right   Extremity/Trunk Assessment Upper Extremity Assessment Upper Extremity Assessment:  (Grossly functional ROM of upper extremities, unable to test strength)   Lower Extremity Assessment Lower Extremity Assessment: Defer to PT evaluation   Cervical / Trunk Assessment Cervical / Trunk Assessment: Normal   Communication Communication Communication: No difficulties   Cognition Arousal/Alertness: Awake/alert Behavior During Therapy: Flat affect Overall Cognitive Status: History of cognitive impairments - at baseline                                 General Comments: Advanced dementia.   General Comments       Exercises     Shoulder Instructions      Home Living Family/patient expects to be discharged to:: Skilled nursing facility                                 Additional Comments: Resident of local SNF. Reportedly bedbound but able to feed himself.      Prior Functioning/Environment Level of Independence: Needs assistance  Gait / Transfers Assistance Needed: bedbound ADL's / Homemaking Assistance Needed: can feed self; probably needs supervision.            OT Problem List: Decreased strength;Decreased range of motion;Impaired balance (sitting and/or standing);Decreased cognition;Decreased safety awareness;Decreased knowledge of use of DME or AE;Pain      OT Treatment/Interventions:      OT Goals(Current goals can be found in the care plan section) Acute Rehab OT Goals OT Goal Formulation: All assessment and education complete, DC therapy  OT Frequency:     Barriers to D/C:            Co-evaluation PT/OT/SLP Co-Evaluation/Treatment: Yes Reason for Co-Treatment: Necessary to address cognition/behavior during functional activity;To address functional/ADL transfers;For  patient/therapist safety PT goals addressed during session: Mobility/safety with mobility OT goals addressed during session: ADL's and self-care      AM-PAC OT "6 Clicks" Daily Activity     Outcome Measure Help from another person eating meals?: A Little Help from another person taking care of personal grooming?: A Little Help from another person toileting, which includes using toliet, bedpan, or urinal?: Total Help from another person bathing (including washing, rinsing, drying)?: A Lot Help from another person to put on and taking off regular upper body clothing?: A Lot Help from another person to put on and taking off regular lower body clothing?: Total 6 Click Score: 12   End of Session    Activity Tolerance: Patient limited by pain Patient left: in bed;with call bell/phone within reach;with bed alarm set (safety mitts)  OT Visit Diagnosis: History of falling (Z91.81);Pain;Other abnormalities of gait and mobility (R26.89) Pain - Right/Left: Left Pain - part of body: Hip;Knee;Leg;Ankle and joints of foot                Time: 5643-3295 OT Time Calculation (min): 23 min Charges:  OT General Charges $OT Visit: 1 Visit OT Evaluation $OT Eval Moderate Complexity: 1 Mod  Doss Cybulski, OTR/L  Acute Care Rehab Services  Office [phone] Pager: [phone]   Kelli Churn 06/08/2020, 1:30 PM

## 2020-06-08 NOTE — Evaluation (Signed)
Physical Therapy Evaluation Patient Details Name: Jeffery Haynes MRN: [id number] DOB: 21-Oct-1931 Today's Date: 06/08/2020   History of Present Illness  87 year old male with advanced dementia status post fall at SNF, resulting in incomplete left femoral neck fracture and nondisplaced left distal femur fracture. Currently patient being treated nonoperatively with left knee immobilizer.  Clinical Impression  Jeffery Haynes is an 87 year old man who presents with above pertinent medical history. On evaluation patient pleasant, able to state name and report pain in left lower extremity as well as make polite conversation. Patient not oriented to time or place.  Patient total assist for bed mobility - does not attempt to physically assist with movement. Is able to to report pain in LLE. Patient exhibits posterior and left lateral lean at edge of bed needing external assistance to maintain balance. Did somewhat improve with placement of hands on an anterior surface. Patient quickly fatigued and returned to bed. Recommend return to nursing facility wih 24/7 care    Follow Up Recommendations SNF    Equipment Recommendations  None recommended by PT    Recommendations for Other Services       Precautions / Restrictions Precautions Precautions: Fall Required Braces or Orthoses: Knee Immobilizer - Left Knee Immobilizer - Left: On when out of bed or walking Restrictions Weight Bearing Restrictions: Yes LLE Weight Bearing: Partial weight bearing LLE Partial Weight Bearing Percentage or Pounds: 50%      Mobility  Bed Mobility Overal bed mobility: Needs Assistance Bed Mobility: Supine to Sit;Sit to Supine     Supine to sit: Total assist;+2 for physical assistance;+2 for safety/equipment;HOB elevated Sit to supine: +2 for physical assistance;+2 for safety/equipment;Total assist   General bed mobility comments: Total assist for transfers. No attempts from patient to assist with any  movement.    Transfers                 General transfer comment: NT based on pt's very limited participation  Ambulation/Gait                Stairs            Wheelchair Mobility    Modified Rankin (Stroke Patients Only)       Balance Overall balance assessment: History of Falls;Needs assistance Sitting-balance support: Bilateral upper extremity supported;Feet supported Sitting balance-Leahy Scale: Poor Sitting balance - Comments: Reliant on upper extermities and external assist. Postural control: Left lateral lean;Posterior lean                                   Pertinent Vitals/Pain Pain Assessment: Faces Faces Pain Scale: Hurts a little bit Pain Location: LLE (knee, foot) Pain Descriptors / Indicators: Grimacing;Guarding Pain Intervention(s): Limited activity within patient's tolerance;Monitored during session    Home Living Family/patient expects to be discharged to:: Skilled nursing facility                 Additional Comments: Resident of local SNF. Reportedly bedbound but able to feed himself.    Prior Function Level of Independence: Needs assistance   Gait / Transfers Assistance Needed: bedbound  ADL's / Homemaking Assistance Needed: can feed self; probably needs supervision.        Hand Dominance   Dominant Hand: Right    Extremity/Trunk Assessment   Upper Extremity Assessment Upper Extremity Assessment: Defer to OT evaluation    Lower Extremity Assessment Lower Extremity Assessment: Difficult to  assess due to impaired cognition;Generalized weakness    Cervical / Trunk Assessment Cervical / Trunk Assessment: Normal  Communication   Communication: No difficulties  Cognition Arousal/Alertness: Awake/alert Behavior During Therapy: Flat affect Overall Cognitive Status: History of cognitive impairments - at baseline                                 General Comments: Advanced dementia.       General Comments      Exercises     Assessment/Plan    PT Assessment Patient needs continued PT services  PT Problem List Decreased strength;Decreased range of motion;Decreased activity tolerance;Decreased balance;Decreased mobility;Decreased cognition;Decreased knowledge of use of DME;Pain       PT Treatment Interventions DME instruction;Gait training;Stair training;Functional mobility training;Therapeutic activities;Therapeutic exercise;Patient/family education;Balance training;Cognitive remediation    PT Goals (Current goals can be found in the Care Plan section)  Acute Rehab PT Goals Patient Stated Goal: Lay back down PT Goal Formulation: Patient unable to participate in goal setting Time For Goal Achievement: 06/15/20 Potential to Achieve Goals: Poor    Frequency 7X/week   Barriers to discharge        Co-evaluation PT/OT/SLP Co-Evaluation/Treatment: Yes Reason for Co-Treatment: Necessary to address cognition/behavior during functional activity;For patient/therapist safety PT goals addressed during session: Mobility/safety with mobility OT goals addressed during session: ADL's and self-care       AM-PAC PT "6 Clicks" Mobility  Outcome Measure Help needed turning from your back to your side while in a flat bed without using bedrails?: Total Help needed moving from lying on your back to sitting on the side of a flat bed without using bedrails?: Total Help needed moving to and from a bed to a chair (including a wheelchair)?: Total Help needed standing up from a chair using your arms (e.g., wheelchair or bedside chair)?: Total Help needed to walk in hospital room?: Total Help needed climbing 3-5 steps with a railing? : Total 6 Click Score: 6    End of Session Equipment Utilized During Treatment: Gait belt Activity Tolerance: Patient limited by fatigue Patient left: in bed;with call bell/phone within reach;with bed alarm set Nurse Communication: Mobility  status PT Visit Diagnosis: Muscle weakness (generalized) (M62.81);Difficulty in walking, not elsewhere classified (R26.2)    Time: 1117-1140 PT Time Calculation (min) (ACUTE ONLY): 23 min   Charges:   PT Evaluation $PT Eval Moderate Complexity: 1 Mod          Mauro Kaufmann PT Acute Rehabilitation Services Pager [phone] Office [phone]   Jemel Ono 06/08/2020, 4:25 PM

## 2020-06-08 NOTE — Progress Notes (Signed)
PROGRESS NOTE    Jeffery Haynes  BZJ:[id number] DOB: 06/27/1931 DOA: 06/06/2020 PCP: Patient, No Pcp Per   Brief Narrative: 87 y.o. male with medical history significant for DMT2, HTN, paroxysmal atrial fibrillation, Alzheimer's dementia who presents from home SNF after a fall yesterday.  Reportedly patient is normally oriented to himself at baseline.  He complained of pain in his left thigh and hip at the facility and was not able to stand so he was sent to the hospital for evaluation.  He was found to have a femoral neck fracture on the left and a distal femur fracture on the left.  There is no report of any loss of consciousness headache, head injury, nausea, vomiting, diarrhea.  Patient unable to give any history on his own.  Family is not present.  Reportedly the ER physician discussed with the family earlier that the patient did have a hip fracture and the family wanted to have orthopedic evaluation and discuss treatment options with orthopedics before planning or consenting to surgery.   Assessment & Plan:   Principal Problem:   Fracture of femoral neck, left, closed (HCC) Active Problems:   Alzheimer's dementia (HCC)   Closed fracture of left distal femur (HCC)   Essential hypertension   Diabetes mellitus type 2 in nonobese (HCC)   Fracture of femoral neck, left (HCC)   #1Left femoral neck fracture-seen by Ortho.  Discussed with wife.  Patient has not walked for 3 to 4 years.  He is bedbound and wheelchair-bound.  He has severe dementia.  PT to evaluate him tomorrow to see his pain tolerance/level to decide on surgery.  Continue Tylenol around-the-clock.  #2 HTN continue Norvasc.  #3 type 2 DM on SSI. CBG (last 3)  Recent Labs    06/07/20 2045 06/08/20 0758 06/08/20 1156  GLUCAP 134* 88 142*     #4 dementia-he has severe end-stage dementia and sundowning.  Wife reports he does well with Ativan.  Estimated body mass index is 19.26 kg/m as calculated from the  following:   Height as of this encounter: 6' (1.829 m).   Weight as of this encounter: 64.4 kg.  DVT prophylaxis:lovenox Code Status:dnr Family Communication: none at bed side  Disposition Plan:  Status is: Inpatient  Dispo: The patient is from:nursing home              Anticipated d/c is OF:[facility] home              Patient currently not medically stable to dc   Difficult to place patient no   Consultants: ortho  Procedures: none Antimicrobials:none Subjective: Resting in bed in nad he had a rough night currently he is resting in bed smiling.  Objective: Vitals:   06/07/20 1305 06/07/20 1943 06/07/20 2106 06/08/20 0520  BP: 139/87  (!) 151/80 (!) 141/84  Pulse: 72  78 80  Resp: 16  17 16   Temp: (!) 97.5 F (36.4 C)  99.1 F (37.3 C) 98.5 F (36.9 C)  TempSrc: Oral  Oral Oral  SpO2:  96% 100% 99%  Weight:      Height:        Intake/Output Summary (Last 24 hours) at 06/08/2020 1215 Last data filed at 06/08/2020 1008 Gross per 24 hour  Intake 2458.34 ml  Output 850 ml  Net 1608.34 ml   Filed Weights   06/07/20 0400  Weight: 64.4 kg    Examination:  General exam: Appears calm and comfortable  Respiratory system: Clear to auscultation. Respiratory effort  normal. Cardiovascular system: S1 & S2 heard, RRR. No JVD, murmurs, rubs, gallops or clicks. No pedal edema. Gastrointestinal system: Abdomen is nondistended, soft and nontender. No organomegaly or masses felt. Normal bowel sounds heard. Central nervous system: Confused Extremities: Symmetric 5 x 5 power. Skin: No rashes, lesions or ulcers Psychiatry: Confused   Data Reviewed: I have personally reviewed following labs and imaging studies  CBC: Recent Labs  Lab 06/06/20 2330 06/07/20 0327  WBC 9.7 8.8  NEUTROABS 6.5  --   HGB 12.3* 12.1*  HCT 39.7 38.6*  MCV 95.7 94.6  PLT 234 230   Basic Metabolic Panel: Recent Labs  Lab 06/06/20 2330 06/07/20 0327  NA 139 139  K 4.4 4.0  CL 103 102  CO2 28  28  GLUCOSE 96 132*  BUN 18 18  CREATININE 0.88 0.74  CALCIUM 9.1 8.8*   GFR: Estimated Creatinine Clearance: 60.4 mL/min (by C-G formula based on SCr of 0.74 mg/dL). Liver Function Tests: No results for input(s): AST, ALT, ALKPHOS, BILITOT, PROT, ALBUMIN in the last 168 hours. No results for input(s): LIPASE, AMYLASE in the last 168 hours. No results for input(s): AMMONIA in the last 168 hours. Coagulation Profile: No results for input(s): INR, PROTIME in the last 168 hours. Cardiac Enzymes: No results for input(s): CKTOTAL, CKMB, CKMBINDEX, TROPONINI in the last 168 hours. BNP (last 3 results) No results for input(s): PROBNP in the last 8760 hours. HbA1C: Recent Labs    06/07/20 0327  HGBA1C 6.1*   CBG: Recent Labs  Lab 06/07/20 1123 06/07/20 1647 06/07/20 2045 06/08/20 0758 06/08/20 1156  GLUCAP 194* 113* 134* 88 142*   Lipid Profile: No results for input(s): CHOL, HDL, LDLCALC, TRIG, CHOLHDL, LDLDIRECT in the last 72 hours. Thyroid Function Tests: No results for input(s): TSH, T4TOTAL, FREET4, T3FREE, THYROIDAB in the last 72 hours. Anemia Panel: No results for input(s): VITAMINB12, FOLATE, FERRITIN, TIBC, IRON, RETICCTPCT in the last 72 hours. Sepsis Labs: No results for input(s): PROCALCITON, LATICACIDVEN in the last 168 hours.  Recent Results (from the past 240 hour(s))  Resp Panel by RT-PCR (Flu A&B, Covid) Nasopharyngeal Swab     Status: None   Collection Time: 06/06/20 10:11 PM   Specimen: Nasopharyngeal Swab; Nasopharyngeal(NP) swabs in vial transport medium  Result Value Ref Range Status   SARS Coronavirus 2 by RT PCR NEGATIVE NEGATIVE Final    Comment: (NOTE) SARS-CoV-2 target nucleic acids are NOT DETECTED.  The SARS-CoV-2 RNA is generally detectable in upper respiratory specimens during the acute phase of infection. The lowest concentration of SARS-CoV-2 viral copies this assay can detect is 138 copies/mL. A negative result does not preclude  SARS-Cov-2 infection and should not be used as the sole basis for treatment or other patient management decisions. A negative result may occur with  improper specimen collection/handling, submission of specimen other than nasopharyngeal swab, presence of viral mutation(s) within the areas targeted by this assay, and inadequate number of viral copies(<138 copies/mL). A negative result must be combined with clinical observations, patient history, and epidemiological information. The expected result is Negative.  Fact Sheet for Patients:  BloggerCourse.com  Fact Sheet for Healthcare Providers:  SeriousBroker.it  This test is no t yet approved or cleared by the [place] FDA and  has been authorized for detection and/or diagnosis of SARS-CoV-2 by FDA under an Emergency Use Authorization (EUA). This EUA will remain  in effect (meaning this test can be used) for the duration of the COVID-19 declaration under Section 564(b)(1)  of the Act, 21 U.S.C.section 360bbb-3(b)(1), unless the authorization is terminated  or revoked sooner.       Influenza A by PCR NEGATIVE NEGATIVE Final   Influenza B by PCR NEGATIVE NEGATIVE Final    Comment: (NOTE) The Xpert Xpress SARS-CoV-2/FLU/RSV plus assay is intended as an aid in the diagnosis of influenza from Nasopharyngeal swab specimens and should not be used as a sole basis for treatment. Nasal washings and aspirates are unacceptable for Xpert Xpress SARS-CoV-2/FLU/RSV testing.  Fact Sheet for Patients: BloggerCourse.com  Fact Sheet for Healthcare Providers: SeriousBroker.it  This test is not yet approved or cleared by the [place] FDA and has been authorized for detection and/or diagnosis of SARS-CoV-2 by FDA under an Emergency Use Authorization (EUA). This EUA will remain in effect (meaning this test can be used) for the duration of  the COVID-19 declaration under Section 564(b)(1) of the Act, 21 U.S.C. section 360bbb-3(b)(1), unless the authorization is terminated or revoked.  Performed at [facility], [address], [place], [place] [address]          Radiology Studies: CT HIP LEFT WO CONTRAST  Result Date: 06/06/2020 CLINICAL DATA:  Left leg pain following fall with findings suggestive of subcapital femoral neck fracture on plain film EXAM: CT OF THE LEFT HIP WITHOUT CONTRAST TECHNIQUE: Multidetector CT imaging of the left hip was performed according to the standard protocol. Multiplanar CT image reconstructions were also generated. COMPARISON:  None. FINDINGS: Bones/Joint/Cartilage Diffuse osteoporosis is noted the cortical irregularity on the lateral aspect of the femoral neck in the subcapital region is noted similar to that seen on prior plain film. These changes are highly suspicious for an incomplete subcapital femoral neck fracture. No other femur fracture is seen. The remainder of the pelvic structures show no acute fracture. Degenerative changes of the lumbar spine are noted. Ligaments Suboptimally assessed by CT. Muscles and Tendons Surrounding musculature is within normal limits. Soft tissues Soft tissue structures of the pelvis and upper left thigh show no acute abnormality. IMPRESSION: Cortical irregularity along the lateral aspect of the femoral neck similar to that seen on prior plain film examination. These changes are highly suspicious for incomplete subcapital femoral neck fracture. No other fractures are seen. Limited hip protocol MRI may be helpful for further evaluation. No other focal abnormality is seen. Electronically Signed   By: Alcide Clever M.D.   On: 06/06/2020 22:58   DG Knee Complete 4 Views Left  Result Date: 06/06/2020 CLINICAL DATA:  Recent fall with left knee pain, initial encounter EXAM: LEFT KNEE - COMPLETE 4+ VIEW COMPARISON:  None. FINDINGS: There is an  undisplaced distal femoral fracture involving the metaphysis without significant extension into the joint space. Degenerative changes of the knee joint are seen. Small joint effusion is noted. IMPRESSION: Undisplaced distal femoral metaphyseal fracture. Electronically Signed   By: Alcide Clever M.D.   On: 06/06/2020 20:28   DG Hip Unilat W or Wo Pelvis 2-3 Views Left  Result Date: 06/06/2020 CLINICAL DATA:  Fall yesterday, pain EXAM: DG HIP (WITH OR WITHOUT PELVIS) 2-3V LEFT COMPARISON:  None. FINDINGS: Technically very limited radiographs. Osteopenia. Impacted subcapital fracture of the left femoral neck. No other obvious fracture of the pelvis or proximal right femur. IMPRESSION: Impacted subcapital fracture of the left femoral neck. No other obvious fracture of the pelvis or proximal right femur. Osteopenia. Electronically Signed   By: Lauralyn Primes M.D.   On: 06/06/2020 20:28  Scheduled Meds: . amLODipine  5 mg Oral q AM  . citalopram  15 mg Oral q AM  . donepezil  5 mg Oral QHS  . enoxaparin (LOVENOX) injection  40 mg Subcutaneous Q24H  . insulin aspart  0-5 Units Subcutaneous QHS  . insulin aspart  0-9 Units Subcutaneous TID WC  . LORazepam  1 mg Oral Once  . metFORMIN  1,000 mg Oral BID WC  . mupirocin ointment  1 application Nasal BID  . senna-docusate  2 tablet Oral BID  . tamsulosin  0.4 mg Oral QHS   Continuous Infusions: . lactated ringers 100 mL/hr at 06/08/20 1046     LOS: 1 day   Alwyn RenElizabeth G Mathews, MD 06/08/2020, 12:15 PM

## 2020-06-08 NOTE — Progress Notes (Signed)
Pt confused and pulling at tubes. No needs at this time. Nursing staff replaced condom cath after pt pulled it off. Rn will continue to monitor.

## 2020-06-08 NOTE — Plan of Care (Signed)
  Problem: Clinical Measurements: Goal: Respiratory complications will improve Outcome: Progressing   Problem: Clinical Measurements: Goal: Cardiovascular complication will be avoided Outcome: Progressing   Problem: Elimination: Goal: Will not experience complications related to bowel motility Outcome: Progressing   Problem: Elimination: Goal: Will not experience complications related to urinary retention Outcome: Progressing   Problem: Skin Integrity: Goal: Risk for impaired skin integrity will decrease Outcome: Progressing   

## 2020-06-08 NOTE — Progress Notes (Signed)
Pt moved closer to nurses station to be monitored more closely by nursing staff.

## 2020-06-08 NOTE — Progress Notes (Signed)
Orthopaedic Trauma Progress Note  SUBJECTIVE: More alert and talkative this morning.  Reports mild to moderate pain in the left hip and thigh.  States that the pain does not travel down to his knee.  Pain medications helping a small amount to ease the pain but not significantly. No chest pain. No SOB. No nausea/vomiting. No other complaints.  Was unable to work with therapy yesterday due to lethargy.  OBJECTIVE:  Vitals:   06/07/20 2106 06/08/20 0520  BP: (!) 151/80 (!) 141/84  Pulse: 78 80  Resp: 17 16  Temp: 99.1 F (37.3 C) 98.5 F (36.9 C)  SpO2: 100% 99%    General: Laying in bed comfortably, calm.  No acute distress Respiratory: No increased work of breathing.  Left lower extremity: Knee immobilizer in place. Tenderness with palpation over the hip but no significant tenderness throughout the thigh or knee.  Ankle dorsiflexion/plantarflexion is intact.  Patient does not want to bend knee currently.  Endorses sensation to light touch distally. + DP pulse  LABS:  Results for orders placed or performed during the hospital encounter of 06/06/20 (from the past 24 hour(s))  Glucose, capillary     Status: Abnormal   Collection Time: 06/07/20  7:25 AM  Result Value Ref Range   Glucose-Capillary 248 (H) 70 - 99 mg/dL  Glucose, capillary     Status: Abnormal   Collection Time: 06/07/20 11:23 AM  Result Value Ref Range   Glucose-Capillary 194 (H) 70 - 99 mg/dL  Glucose, capillary     Status: Abnormal   Collection Time: 06/07/20  4:47 PM  Result Value Ref Range   Glucose-Capillary 113 (H) 70 - 99 mg/dL  Glucose, capillary     Status: Abnormal   Collection Time: 06/07/20  8:45 PM  Result Value Ref Range   Glucose-Capillary 134 (H) 70 - 99 mg/dL    ASSESSMENT: Jeffery Haynes is a 87 y.o. male status post fall Injuries: 1. Left femoral neck fracture 2.  Left distal femur fracture  CV/Blood loss: Hgb 12.1, stable. Hemodynamically stable  PLAN: Weightbearing: PWB 50%   LLE Showering: Okay to shower with assistance Orthopedic device(s):  Knee immobilizer LLE when ambulating   Pain management: Continue per primary team 1. Tylenol 325-650 mg q 6 hours PRN 2. Norco 5-325 mg (1-2 tablets) q 6 hours PRN moderate pain 3. Dilaudid 0.5 mg q 3 hours PRN severe pain VTE prophylaxis: Lovenox, SCDs, ambulation Foley/Lines: No foley, KVO IVFs Impediments to Fracture Healing: Vitamin D level pending, will start supplementation as indicated Dispo: PT/OT evaluation today.  Depending on level of pain when mobilizing with therapies, wife did express that she would like to revisit discussion surgical versus nonsurgical intervention.  Hold off on transitioning back to Eliquis until final decision on surgery has been made. Follow - up plan:  TBD  Contact information:  Truitt Merle MD, Ulyses Southward PA-C. After hours and holidays please check [email or web address] for group call information for [facility]   Jodi Criscuolo A. Michaelyn Barter, PA-C [phone] (office) [email or web address]
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
## 2020-06-08 NOTE — Plan of Care (Signed)
?  Problem: Elimination: ?Goal: Will not experience complications related to urinary retention ?Outcome: Progressing ?  ?

## 2020-06-09 ENCOUNTER — Inpatient Hospital Stay (HOSPITAL_COMMUNITY): Payer: Medicare Other

## 2020-06-09 LAB — GLUCOSE, CAPILLARY
Glucose-Capillary: 118 mg/dL — ABNORMAL HIGH (ref 70–99)
Glucose-Capillary: 159 mg/dL — ABNORMAL HIGH (ref 70–99)
Glucose-Capillary: 116 mg/dL — ABNORMAL HIGH (ref 70–99)
Glucose-Capillary: 136 mg/dL — ABNORMAL HIGH (ref 70–99)

## 2020-06-09 IMAGING — DX DG CHEST 1V
1 series · 1 of 1 positions shown · non-contrast
Comparison: [DATE]

CLINICAL DATA: Cough.  Dementia.  Recent femur fracture.

EXAM:
CHEST  1 VIEW

[chest ap]
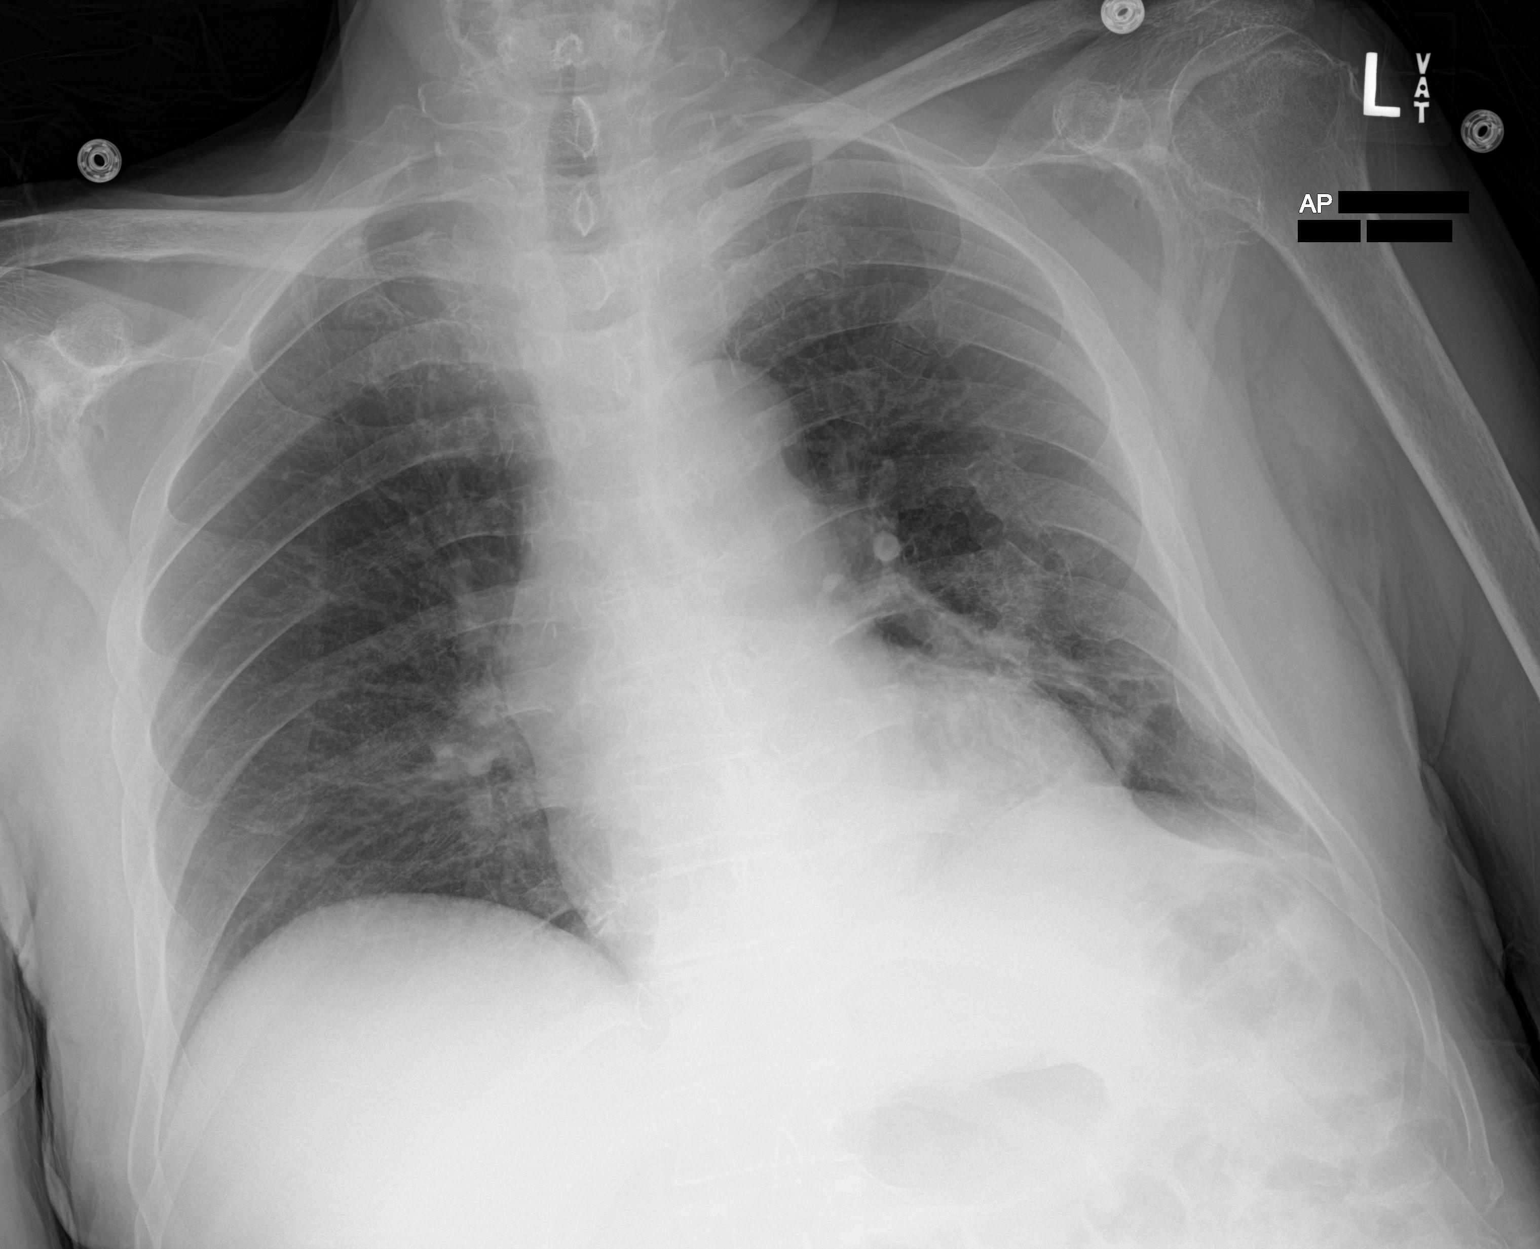

[1 of 1 positions shown; findings below may reference images not displayed]

FINDINGS: Midline trachea. Normal heart size. No pleural effusion or
pneumothorax. Clear right lung. Increased density along the left
heart border which is felt to be slightly increased compared to the
prior exams.
IMPRESSION: Increased density along the left heart border, increased over prior
exams. Suspicious for mild infection or aspiration. Consider
radiographic follow-up in 2-3 days.

## 2020-06-09 MED ORDER — GLUCERNA SHAKE PO LIQD
237.0000 mL | Freq: Two times a day (BID) | ORAL | Status: DC
  Administered 2020-06-10: 237 mL via ORAL
  Filled 2020-06-09 (×3): qty 237

## 2020-06-09 MED ORDER — LORAZEPAM 0.5 MG PO TABS: 0.5000 mg | ORAL_TABLET | Freq: Three times a day (TID) | ORAL | Status: DC | PRN

## 2020-06-09 MED ORDER — DIVALPROEX SODIUM 125 MG PO CSDR
250.0000 mg | DELAYED_RELEASE_CAPSULE | Freq: Two times a day (BID) | ORAL | Status: DC
  Administered 2020-06-09 (×2): 250 mg via ORAL
  Filled 2020-06-09 (×4): qty 2

## 2020-06-09 MED ORDER — METFORMIN HCL 500 MG PO TABS
500.0000 mg | ORAL_TABLET | Freq: Two times a day (BID) | ORAL | Status: DC
Start: 2020-06-09 — End: 2020-06-11
  Administered 2020-06-09 – 2020-06-10 (×2): 500 mg via ORAL
  Filled 2020-06-09 (×2): qty 1

## 2020-06-09 MED ORDER — OLANZAPINE 2.5 MG PO TABS
2.5000 mg | ORAL_TABLET | Freq: Every day | ORAL | Status: DC
Start: 2020-06-09 — End: 2020-06-11
  Administered 2020-06-09: 2.5 mg via ORAL
  Filled 2020-06-09 (×2): qty 1

## 2020-06-09 MED ORDER — AMOXICILLIN-POT CLAVULANATE 875-125 MG PO TABS
1.0000 | ORAL_TABLET | Freq: Two times a day (BID) | ORAL | Status: DC
  Administered 2020-06-09 (×2): 1 via ORAL
  Filled 2020-06-09 (×3): qty 1

## 2020-06-09 MED ORDER — PROSOURCE PLUS PO LIQD
30.0000 mL | Freq: Every day | ORAL | Status: DC
  Administered 2020-06-09: 30 mL via ORAL
  Filled 2020-06-09: qty 30

## 2020-06-09 NOTE — Progress Notes (Signed)
Initial Nutrition Assessment  DOCUMENTATION CODES:   Not applicable  INTERVENTION:  - continue Glucerna Shake but will decrease from TIDto BID, each supplement provides 220 kcal and 10 grams of protein. - will order 30 ml Prosource Plus once/day, each supplement provides 100 kcal and 15 grams protein.  - complete NFPE at follow-up.  NUTRITION DIAGNOSIS:   Increased nutrient needs related to acute illness as evidenced by estimated needs.  GOAL:   Patient will meet greater than or equal to 90% of their needs  MONITOR:   PO intake,Supplement acceptance,Labs,Weight trends  REASON FOR ASSESSMENT:   Malnutrition Screening Tool  ASSESSMENT:   87 y.o. male with medical history of type 2 DM, HTN, afib, and Alzheimer's dementia. He presented to the ED from SNF after a fall with subsequent L hip and thigh pain. He was unable to stand after the fall.  He has been eating 0-100% at meals, mainly 50-100%, since diet advancement on 3/26 at 0312.   Glucerna Shake ordered TID starting yesterday and he has accepted all 3 bottles of this supplement offered.  Patient laying in bed self feeding lunch at the time of visit. His two sisters are at bedside and provide all information as patient is a/o to self only (his baseline).   He is able to feed himself at baseline although he is weaker than usual so it has been a bit more challenging for him. Sisters request that meats be chopped up before being served to patient. They deny him having any swallowing or chewing difficulties; SLP saw patient today and recommended regular, thin liquids.   Weight on 3/26 was 142 lb and weight on 02/01/20 was 152 lb. This indicates 10 lb weight loss (6.6% body weight) in the past 4 months; not significant for time frame.    Labs reviewed; CBGs: 118 and 159 mg/dl. Medications reviewed; sliding scale novolog, 500 mg metformin BID, 2 tablets senokot BID. IVF; LR @ 100 ml/hr.     NUTRITION - FOCUSED PHYSICAL  EXAM:  unable to complete at this time as patient was eating.  Diet Order:   Diet Order            Diet heart healthy/carb modified Room service appropriate? Yes; Fluid consistency: Thin  Diet effective now                 EDUCATION NEEDS:   No education needs have been identified at this time  Skin:  Skin Assessment: Reviewed RN Assessment  Last BM:  3/27 (type 6 x1)  Height:   Ht Readings from Last 1 Encounters:  06/07/20 6' (1.829 m)    Weight:   Wt Readings from Last 1 Encounters:  06/07/20 64.4 kg    Estimated Nutritional Needs:  Kcal:  1800-2000 kcal Protein:  85-100 grams Fluid:  >/= 2.2 L/day      Trenton Gammon, MS, RD, LDN, CNSC Inpatient Clinical Dietitian RD pager # available in AMION  After hours/weekend pager # available in [facility]

## 2020-06-09 NOTE — Evaluation (Signed)
Clinical/Bedside Swallow Evaluation Patient Details  Name: Jeffery Haynes MRN: [id number] Date of Birth: 01-06-1932  Today's Date: 06/09/2020 Time: SLP Start Time (ACUTE ONLY): 1230 SLP Stop Time (ACUTE ONLY): 1250 SLP Time Calculation (min) (ACUTE ONLY): 20 min  Past Medical History:  Past Medical History:  Diagnosis Date  . Alzheimer disease (HCC)   . Dementia (HCC)   . Diabetes mellitus without complication [facility])    Past Surgical History: History reviewed. No pertinent surgical history. HPI:  Patient is an 87 y.o. male with PMH: DM-2, Alzheimer's dementia, HTN, paroxysmal atrial fibrillation, who presented from SNF where he is a resident after a fall. He was c/o left thigh and hip pain and he was not able to stand so he was sent to hospital for evaluation. Patient found to have incomplete left femoral neck fracture and nondisplaced left distal femur fracture. Currently patient being treated nonoperatively with left knee immobilizer. CXR revealed Increased density along the left heart border, increased over prior  exams, suspicious for mild infection or aspiration.   Assessment / Plan / Recommendation Clinical Impression  Patient presents with a mild, likely cognitive-based dysphagia resulting in prolonged mastication of regular solids and oral residuals (mild in amount) which eventually clear with swallows of liquids. Patient did not exhibit any coughing, throat clearing or other overt s/s aspiration or penetration and his two sisters who are present in room both deny any difficulty. Suspect recent episode of coughing with PO intake was an isolated incident but SLP will check back with patient if he remains at hospital to ensure toleration of PO's. SLP Visit Diagnosis: Dysphagia, unspecified (R13.10)    Aspiration Risk  Mild aspiration risk    Diet Recommendation Regular;Thin liquid   Liquid Administration via: Cup;Straw Medication Administration: Whole meds with  liquid Supervision: Full supervision/cueing for compensatory strategies;Staff to assist with self feeding Compensations: Minimize environmental distractions;Slow rate;Small sips/bites Postural Changes: Seated upright at 90 degrees    Other  Recommendations Oral Care Recommendations: Oral care BID;Staff/trained caregiver to provide oral care   Follow up Recommendations None      Frequency and Duration min 1 x/week  1 week       Prognosis Prognosis for Safe Diet Advancement: Good      Swallow Study   General Date of Onset: 06/09/20 HPI: Patient is an 87 y.o. male with PMH: DM-2, Alzheimer's dementia, HTN, paroxysmal atrial fibrillation, who presented from SNF where he is a resident after a fall. He was c/o left thigh and hip pain and he was not able to stand so he was sent to hospital for evaluation. Patient found to have incomplete left femoral neck fracture and nondisplaced left distal femur fracture. Currently patient being treated nonoperatively with left knee immobilizer. CXR revealed Increased density along the left heart border, increased over prior  exams, suspicious for mild infection or aspiration. Type of Study: Bedside Swallow Evaluation Previous Swallow Assessment: None found Diet Prior to this Study: Regular;Thin liquids Temperature Spikes Noted: No Respiratory Status: Room air History of Recent Intubation: No Behavior/Cognition: Alert;Cooperative;Pleasant mood Oral Cavity Assessment: Within Functional Limits Oral Care Completed by SLP: No Oral Cavity - Dentition: Adequate natural dentition Vision: Functional for self-feeding Self-Feeding Abilities: Needs assist;Needs set up Patient Positioning: Upright in bed Baseline Vocal Quality: Normal Volitional Cough: Cognitively unable to elicit Volitional Swallow: Unable to elicit    Oral/Motor/Sensory Function Overall Oral Motor/Sensory Function: Within functional limits   Ice Chips     Thin Liquid Thin Liquid: Within  functional  limits Presentation: Straw    Nectar Thick     Honey Thick     Puree     Solid     Solid: Impaired Oral Phase Impairments: Impaired mastication Oral Phase Functional Implications: Prolonged oral transit;Oral residue      Angela Nevin, MA, CCC-SLP Speech Therapy

## 2020-06-09 NOTE — Progress Notes (Addendum)
Orthopaedic Trauma Progress Note  SUBJECTIVE: Continues to have mild to moderate pain in the left hip and thigh.  States that the pain does not travel down to his knee.  Pain medications helping a small amount to ease the pain but not significantly. No chest pain. No SOB. No nausea/vomiting. No other complaints.  Was able to work with therapies yesterday.  Had a good night per nursing  OBJECTIVE:  Vitals:   06/08/20 2105 06/09/20 0501  BP: (!) 166/121 (!) 154/83  Pulse: (!) 50 79  Resp: 16 16  Temp: (!) 97.5 F (36.4 C) 98.1 F (36.7 C)  SpO2: (!) 87% 99%    General: Laying in bed comfortably, calm.  No acute distress Respiratory: No increased work of breathing.  Left lower extremity: Knee immobilizer in place. Tenderness with palpation over the hip but no significant tenderness throughout the thigh or knee.  Ankle dorsiflexion/plantarflexion is intact.  Patient does not want to bend knee currently.  Endorses sensation to light touch distally. + DP pulse  LABS:  Results for orders placed or performed during the hospital encounter of 06/06/20 (from the past 24 hour(s))  Glucose, capillary     Status: Abnormal   Collection Time: 06/08/20  4:49 PM  Result Value Ref Range   Glucose-Capillary 119 (H) 70 - 99 mg/dL  Glucose, capillary     Status: Abnormal   Collection Time: 06/08/20  9:10 PM  Result Value Ref Range   Glucose-Capillary 108 (H) 70 - 99 mg/dL  Glucose, capillary     Status: Abnormal   Collection Time: 06/09/20  7:42 AM  Result Value Ref Range   Glucose-Capillary 118 (H) 70 - 99 mg/dL  Glucose, capillary     Status: Abnormal   Collection Time: 06/09/20 11:29 AM  Result Value Ref Range   Glucose-Capillary 159 (H) 70 - 99 mg/dL   IMAGING: X-ray left hip revealED slight cortical irregularity along the lateral aspect of the femoral neck, likely consistent with incomplete subcapital femoral neck fracture.  No fracture clearly seen on CT scan of the hip.  - AP and lateral  views of the left knee show nondisplaced fracture of the distal femur with no intra-articular extension.  Degenerative changes noted in both the medial and lateral joint space.  ASSESSMENT: Jeffery Haynes is a 87 y.o. male status post fall Injuries: 1. Left possible femoral neck fracture 2.  Left distal femur fracture  CV/Blood loss: Hgb stable  PLAN: Weightbearing: PWB 50%  LLE Showering: Okay to shower with assistance Orthopedic device(s):  Knee immobilizer LLE when ambulating   Pain management: Continue per primary team 1. Tylenol 325-650 mg q 6 hours PRN 2. Norco 5-325 mg (1-2 tablets) q 6 hours PRN moderate pain 3. Dilaudid 0.5 mg q 3 hours PRN severe pain VTE prophylaxis: Lovenox, SCDs, ambulation Foley/Lines: No foley, KVO IVFs Impediments to Fracture Healing: Vitamin D level 37, no need for supplementation at this time.   Dispo: No definitive fracture of the left hip seen on CT scan. We will plan to continue with non-surgical management of fractures as definitive management plan.  Continue with pain management per primary team.  Continue with therapies as able.  Okay to resume Eliquis.  Patient okay for discharge back to SNF from Ortho standpoint once cleared by therapies and medical team.  Follow - up plan: 2-3 weeks for repeat x-rays of left hip and left knee  Contact information:  Truitt Merle MD, Ulyses Southward PA-C. After hours and holidays  please check [email or web address] for group call information for [facility]   Renso Swett A. Michaelyn Barter, PA-C [phone] (office) [email or web address]

## 2020-06-09 NOTE — Progress Notes (Signed)
PROGRESS NOTE    Jeffery Haynes  VXB:[id number] DOB: April 06, 1931 DOA: 06/06/2020 PCP: Patient, No Pcp Per   Brief Narrative: 87 y.o. male with medical history significant for DMT2, HTN, paroxysmal atrial fibrillation, Alzheimer's dementia who presents from home SNF after a fall yesterday.  Reportedly patient is normally oriented to himself at baseline.  He complained of pain in his left thigh and hip at the facility and was not able to stand so he was sent to the hospital for evaluation.  He was found to have a femoral neck fracture on the left and a distal femur fracture on the left.  There is no report of any loss of consciousness headache, head injury, nausea, vomiting, diarrhea.  Patient unable to give any history on his own.  Family is not present.  Reportedly the ER physician discussed with the family earlier that the patient did have a hip fracture and the family wanted to have orthopedic evaluation and discuss treatment options with orthopedics before planning or consenting to surgery.   Assessment & Plan:   Principal Problem:   Fracture of femoral neck, left, closed (HCC) Active Problems:   Alzheimer's dementia (HCC)   Closed fracture of left distal femur (HCC)   Essential hypertension   Diabetes mellitus type 2 in nonobese (HCC)   Fracture of femoral neck, left (HCC)   #1Left femoral neck fracture-seen by Ortho.  Discussed with wife.  Patient has not walked for 3 to 4 years.  He is bedbound and wheelchair-bound.  He has severe dementia.  Continue Tylenol around-the-clock. PT evaluation noted.  Await final recommendations from Ortho.  If no surgery planned we will discharge him back to nursing home.  #2 HTN continue Norvasc.  #3 type 2 DM on SSI. CBG (last 3)  Recent Labs    06/08/20 2110 06/09/20 0742 06/09/20 1129  GLUCAP 108* 118* 159*     #4 dementia-he has severe end-stage dementia and sundowning.  Wife reports he does well with Ativan.  #5 possible aspiration  pneumonia patient has been coughing the whole time I was in the room.  He did not cough like this yesterday.  Chest x-ray obtained showed suspicious infection aspiration over the left heart border.  Will do Augmentin for 5 days.  Estimated body mass index is 19.26 kg/m as calculated from the following:   Height as of this encounter: 6' (1.829 m).   Weight as of this encounter: 64.4 kg.  DVT prophylaxis:lovenox Code Status:dnr Family Communication: none at bed side  Disposition Plan:  Status is: Inpatient  Dispo: The patient is from:nursing home              Anticipated d/c is [id number] home              Patient currently not medically stable to dc   Difficult to place patient no   Consultants: ortho  Procedures: none Antimicrobials:none Subjective: He is resting in bed overnight staff reported he had a good night there was no confusion or delirious behavior overnight.  He is on Ativan nightly.  Objective: Vitals:   06/08/20 0520 06/08/20 1339 06/08/20 2105 06/09/20 0501  BP: (!) 141/84 107/69 (!) 166/121 (!) 154/83  Pulse: 80 73 (!) 50 79  Resp: 16 18 16 16   Temp: 98.5 F (36.9 C) 98.7 F (37.1 C) (!) 97.5 F (36.4 C) 98.1 F (36.7 C)  TempSrc: Oral Oral Oral Oral  SpO2: 99% 100% (!) 87% 99%  Weight:      Height:  Intake/Output Summary (Last 24 hours) at 06/09/2020 1146 Last data filed at 06/09/2020 1046 Gross per 24 hour  Intake 3423.82 ml  Output 1600 ml  Net 1823.82 ml   Filed Weights   06/07/20 0400  Weight: 64.4 kg    Examination: He has been coughing which is new since yesterday.  No events overnight.  He is on room air.  General exam: Appears calm and comfortable  Respiratory system: Clear to auscultation. Respiratory effort normal. Cardiovascular system: S1 & S2 heard, RRR. No JVD, murmurs, rubs, gallops or clicks. No pedal edema. Gastrointestinal system: Abdomen is nondistended, soft and nontender. No organomegaly or masses felt. Normal bowel  sounds heard. Central nervous system: Confused Extremities: Symmetric 5 x 5 power. Skin: No rashes, lesions or ulcers Psychiatry: Confused   Data Reviewed: I have personally reviewed following labs and imaging studies  CBC: Recent Labs  Lab 06/06/20 2330 06/07/20 0327  WBC 9.7 8.8  NEUTROABS 6.5  --   HGB 12.3* 12.1*  HCT 39.7 38.6*  MCV 95.7 94.6  PLT 234 230   Basic Metabolic Panel: Recent Labs  Lab 06/06/20 2330 06/07/20 0327  NA 139 139  K 4.4 4.0  CL 103 102  CO2 28 28  GLUCOSE 96 132*  BUN 18 18  CREATININE 0.88 0.74  CALCIUM 9.1 8.8*   GFR: Estimated Creatinine Clearance: 60.4 mL/min (by C-G formula based on SCr of 0.74 mg/dL). Liver Function Tests: No results for input(s): AST, ALT, ALKPHOS, BILITOT, PROT, ALBUMIN in the last 168 hours. No results for input(s): LIPASE, AMYLASE in the last 168 hours. No results for input(s): AMMONIA in the last 168 hours. Coagulation Profile: No results for input(s): INR, PROTIME in the last 168 hours. Cardiac Enzymes: No results for input(s): CKTOTAL, CKMB, CKMBINDEX, TROPONINI in the last 168 hours. BNP (last 3 results) No results for input(s): PROBNP in the last 8760 hours. HbA1C: Recent Labs    06/07/20 0327  HGBA1C 6.1*   CBG: Recent Labs  Lab 06/08/20 1156 06/08/20 1649 06/08/20 2110 06/09/20 0742 06/09/20 1129  GLUCAP 142* 119* 108* 118* 159*   Lipid Profile: No results for input(s): CHOL, HDL, LDLCALC, TRIG, CHOLHDL, LDLDIRECT in the last 72 hours. Thyroid Function Tests: No results for input(s): TSH, T4TOTAL, FREET4, T3FREE, THYROIDAB in the last 72 hours. Anemia Panel: No results for input(s): VITAMINB12, FOLATE, FERRITIN, TIBC, IRON, RETICCTPCT in the last 72 hours. Sepsis Labs: No results for input(s): PROCALCITON, LATICACIDVEN in the last 168 hours.  Recent Results (from the past 240 hour(s))  Resp Panel by RT-PCR (Flu A&B, Covid) Nasopharyngeal Swab     Status: None   Collection Time:  06/06/20 10:11 PM   Specimen: Nasopharyngeal Swab; Nasopharyngeal(NP) swabs in vial transport medium  Result Value Ref Range Status   SARS Coronavirus 2 by RT PCR NEGATIVE NEGATIVE Final    Comment: (NOTE) SARS-CoV-2 target nucleic acids are NOT DETECTED.  The SARS-CoV-2 RNA is generally detectable in upper respiratory specimens during the acute phase of infection. The lowest concentration of SARS-CoV-2 viral copies this assay can detect is 138 copies/mL. A negative result does not preclude SARS-Cov-2 infection and should not be used as the sole basis for treatment or other patient management decisions. A negative result may occur with  improper specimen collection/handling, submission of specimen other than nasopharyngeal swab, presence of viral mutation(s) within the areas targeted by this assay, and inadequate number of viral copies(<138 copies/mL). A negative result must be combined with clinical observations, patient history,  and epidemiological information. The expected result is Negative.  Fact Sheet for Patients:  BloggerCourse.com  Fact Sheet for Healthcare Providers:  SeriousBroker.it  This test is no t yet approved or cleared by the [place] FDA and  has been authorized for detection and/or diagnosis of SARS-CoV-2 by FDA under an Emergency Use Authorization (EUA). This EUA will remain  in effect (meaning this test can be used) for the duration of the COVID-19 declaration under Section 564(b)(1) of the Act, 21 U.S.C.section 360bbb-3(b)(1), unless the authorization is terminated  or revoked sooner.       Influenza A by PCR NEGATIVE NEGATIVE Final   Influenza B by PCR NEGATIVE NEGATIVE Final    Comment: (NOTE) The Xpert Xpress SARS-CoV-2/FLU/RSV plus assay is intended as an aid in the diagnosis of influenza from Nasopharyngeal swab specimens and should not be used as a sole basis for treatment. Nasal washings  and aspirates are unacceptable for Xpert Xpress SARS-CoV-2/FLU/RSV testing.  Fact Sheet for Patients: BloggerCourse.com  Fact Sheet for Healthcare Providers: SeriousBroker.it  This test is not yet approved or cleared by the [place] FDA and has been authorized for detection and/or diagnosis of SARS-CoV-2 by FDA under an Emergency Use Authorization (EUA). This EUA will remain in effect (meaning this test can be used) for the duration of the COVID-19 declaration under Section 564(b)(1) of the Act, 21 U.S.C. section 360bbb-3(b)(1), unless the authorization is terminated or revoked.  Performed at [facility], [address], [place], [place] [address]          Radiology Studies: DG Chest 1 View  Result Date: 06/09/2020 CLINICAL DATA:  Cough.  Dementia.  Recent femur fracture. EXAM: CHEST  1 VIEW COMPARISON:  01/29/2020 FINDINGS: Midline trachea. Normal heart size. No pleural effusion or pneumothorax. Clear right lung. Increased density along the left heart border which is felt to be slightly increased compared to the prior exams. IMPRESSION: Increased density along the left heart border, increased over prior exams. Suspicious for mild infection or aspiration. Consider radiographic follow-up in 2-3 days. Electronically Signed   By: Jeronimo Greaves M.D.   On: 06/09/2020 09:06        Scheduled Meds: . acetaminophen  650 mg Oral Q6H  . amLODipine  5 mg Oral q AM  . citalopram  15 mg Oral q AM  . divalproex  250 mg Oral Q12H  . donepezil  5 mg Oral QHS  . enoxaparin (LOVENOX) injection  40 mg Subcutaneous Q24H  . feeding supplement (GLUCERNA SHAKE)  237 mL Oral TID BM  . insulin aspart  0-5 Units Subcutaneous QHS  . insulin aspart  0-9 Units Subcutaneous TID WC  . lidocaine  1 patch Transdermal Daily  . LORazepam  0.5 mg Intravenous QHS  . LORazepam  1 mg Oral Once  . metFORMIN  1,000 mg Oral BID WC  .  mupirocin ointment  1 application Nasal BID  . senna-docusate  2 tablet Oral BID  . tamsulosin  0.4 mg Oral QHS   Continuous Infusions: . lactated ringers 100 mL/hr at 06/09/20 0341     LOS: 2 days   Alwyn Ren, MD 06/09/2020, 11:46 AM

## 2020-06-09 NOTE — Discharge Instructions (Signed)
Orthopaedic Trauma Service Discharge Instructions   General Discharge Instructions  WEIGHT BEARING STATUS: Partial weightbearing 50% left lower extremity  RANGE OF MOTION/ACTIVITY: Ok for hip motion as tolerated. Ok for gentle left knee range of motion as tolerated. Would recommend wearing knee immobilizer on left leg when ambulating.   DVT/PE prophylaxis: Home dose Eliquis  Diet: as you were eating previously.  Can use over the counter stool softeners and bowel preparations, such as Miralax, to help with bowel movements.  Narcotics can be constipating.  Be sure to drink plenty of fluids  PAIN MEDICATION USE AND EXPECTATIONS  You have likely been given narcotic medications to help control your pain.  After a traumatic event that results in an fracture (broken bone) with or without surgery, it is ok to use narcotic pain medications to help control one's pain.  We understand that everyone responds to pain differently and each individual patient will be evaluated on a regular basis for the continued need for narcotic medications. Ideally, narcotic medication use should last no more than 6-8 weeks (coinciding with fracture healing).   As a patient it is your responsibility as well to monitor narcotic medication use and report the amount and frequency you use these medications when you come to your office visit.   We would also advise that if you are using narcotic medications, you should take a dose prior to therapy to maximize you participation.  STOP SMOKING OR USING NICOTINE PRODUCTS!!!!  As discussed nicotine severely impairs your body's ability to heal surgical and traumatic wounds but also impairs bone healing.  Wounds and bone heal by forming microscopic blood vessels (angiogenesis) and nicotine is a vasoconstrictor (essentially, shrinks blood vessels).  Therefore, if vasoconstriction occurs to these microscopic blood vessels they essentially disappear and are unable to deliver necessary  nutrients to the healing tissue.  This is one modifiable factor that you can do to dramatically increase your chances of healing your injury.    (This means no smoking, no nicotine gum, patches, etc)  DO NOT USE NONSTEROIDAL ANTI-INFLAMMATORY DRUGS (NSAID'S)  Using products such as Advil (ibuprofen), Aleve (naproxen), Motrin (ibuprofen) for additional pain control during fracture healing can delay and/or prevent the healing response.  If you would like to take over the counter (OTC) medication, Tylenol (acetaminophen) is ok.  However, some narcotic medications that are given for pain control contain acetaminophen as well. Therefore, you should not exceed more than 4000 mg of tylenol in a day if you do not have liver disease.  Also note that there are may OTC medicines, such as cold medicines and allergy medicines that my contain tylenol as well.  If you have any questions about medications and/or interactions please ask your doctor/PA or your pharmacist.      ICE AND ELEVATE INJURED/OPERATIVE EXTREMITY  Using ice and elevating the injured extremity above your heart can help with swelling and pain control.  Icing in a pulsatile fashion, such as 20 minutes on and 20 minutes off, can be followed.    Do not place ice directly on skin. Make sure there is a barrier between to skin and the ice pack.    Using frozen items such as frozen peas works well as the conform nicely to the are that needs to be iced.  USE AN ACE WRAP OR TED HOSE FOR SWELLING CONTROL  In addition to icing and elevation, Ace wraps or TED hose are used to help limit and resolve swelling.  It is recommended  to use Ace wraps or TED hose until you are informed to stop.    When using Ace Wraps start the wrapping distally (farthest away from the body) and wrap proximally (closer to the body)   Example: If you had surgery on your leg or thing and you do not have a splint on, start the ace wrap at the toes and work your way up to the thigh         If you had surgery on your upper extremity and do not have a splint on, start the ace wrap at your fingers and work your way up to the upper arm  CALL THE OFFICE WITH ANY QUESTIONS OR CONCERNS: [phone]   VISIT OUR WEBSITE FOR ADDITIONAL INFORMATION: [email or web address]

## 2020-06-10 LAB — GLUCOSE, CAPILLARY
Glucose-Capillary: 92 mg/dL (ref 70–99)
Glucose-Capillary: 85 mg/dL (ref 70–99)
Glucose-Capillary: 134 mg/dL — ABNORMAL HIGH (ref 70–99)

## 2020-06-10 LAB — SARS CORONAVIRUS 2 (TAT 6-24 HRS): SARS Coronavirus 2: NEGATIVE

## 2020-06-10 MED ORDER — AMOXICILLIN-POT CLAVULANATE 875-125 MG PO TABS: 1.0000 | ORAL_TABLET | Freq: Two times a day (BID) | ORAL | 0 refills | Status: DC

## 2020-06-10 MED ORDER — LIDOCAINE 5 % EX PTCH: 1.0000 | MEDICATED_PATCH | Freq: Every day | CUTANEOUS | 0 refills | Status: AC

## 2020-06-10 MED ORDER — LIP MEDEX EX OINT: TOPICAL_OINTMENT | CUTANEOUS | Status: DC | PRN

## 2020-06-10 MED ORDER — ACETAMINOPHEN 325 MG PO TABS: 650.0000 mg | ORAL_TABLET | Freq: Four times a day (QID) | ORAL | 2 refills | Status: AC

## 2020-06-10 NOTE — [place] FL2 (Signed)
[place] MEDICAID FL2 LEVEL OF CARE SCREENING TOOL     IDENTIFICATION  Patient Name: Jeffery Haynes Birthdate: 05/12/1931 Sex: male Admission Date (Current Location): 06/06/2020  [place] and [place] Number:  Haynes Bast [id number] K Facility and Address:  [facility],  [address], [place] [address]      Provider Number: [id number]  Attending Physician Name and Address:  Alwyn Ren, MD  Relative Name and Phone Number:  Vidit Boissonneault (wife) Ph: [phone]    Current Level of Care: Hospital Recommended Level of Care: Skilled Nursing Facility Prior Approval Number:    Date Approved/Denied:   PASRR Number:    Discharge Plan: SNF    Current Diagnoses: Patient Active Problem List   Diagnosis Date Noted  . Alzheimer's dementia (HCC) 06/07/2020  . Fracture of femoral neck, left, closed (HCC) 06/07/2020  . Closed fracture of left distal femur (HCC) 06/07/2020  . Essential hypertension 06/07/2020  . Diabetes mellitus type 2 in nonobese (HCC) 06/07/2020  . Fracture of femoral neck, left (HCC) 06/07/2020  . Acute metabolic encephalopathy 01/29/2020  . Atrial fibrillation with RVR (HCC) 01/29/2020  . Alzheimer disease (HCC)   . Dementia (HCC)     Orientation RESPIRATION BLADDER Height & Weight     Self  Normal Incontinent Weight: 141 lb 15.6 oz (64.4 kg) Height:  6' (182.9 cm)  BEHAVIORAL SYMPTOMS/MOOD NEUROLOGICAL BOWEL NUTRITION STATUS      Incontinent Diet (Heart healthy/carb modified)  AMBULATORY STATUS COMMUNICATION OF NEEDS Skin   Total Care Verbally Skin abrasions (Abrasion: left arm, right leg)                       Personal Care Assistance Level of Assistance  Bathing,Feeding,Dressing Bathing Assistance: Maximum assistance Feeding assistance: Limited assistance Dressing Assistance: Maximum assistance     Functional Limitations Info  Sight,Hearing,Speech Sight Info: Adequate Hearing Info: Adequate Speech Info: Adequate     SPECIAL CARE FACTORS FREQUENCY                       Contractures Contractures Info: Not present    Additional Factors Info  Code Status,Allergies,Psychotropic,Insulin Sliding Scale Code Status Info: DNR Allergies Info: Olanzapine Psychotropic Info: Celexa (citalopram), Depakote (divalproex), Ativan (lorazepam) Insulin Sliding Scale Info: See discharge summary       Current Medications (06/10/2020):  This is the current hospital active medication list Current Facility-Administered Medications  Medication Dose Route Frequency Provider Last Rate Last Admin  . (feeding supplement) PROSource Plus liquid 30 mL  30 mL Oral Daily Alwyn Ren, MD   30 mL at 06/09/20 1634  . acetaminophen (TYLENOL) tablet 650 mg  650 mg Oral Q6H Alwyn Ren, MD   650 mg at 06/10/20 0847  . amLODipine (NORVASC) tablet 5 mg  5 mg Oral q AM Chotiner, Claudean Severance, MD   5 mg at 06/10/20 0846  . amoxicillin-clavulanate (AUGMENTIN) 875-125 MG per tablet 1 tablet  1 tablet Oral Q12H Alwyn Ren, MD   1 tablet at 06/09/20 1236  . citalopram (CELEXA) tablet 15 mg  15 mg Oral q AM Chotiner, Claudean Severance, MD   15 mg at 06/10/20 0847  . divalproex (DEPAKOTE SPRINKLE) capsule 250 mg  250 mg Oral Q12H Alwyn Ren, MD   250 mg at 06/09/20 1033  . donepezil (ARICEPT) tablet 5 mg  5 mg Oral QHS Chotiner, Claudean Severance, MD   5 mg at 06/08/20 2146  . enoxaparin (LOVENOX)  injection 40 mg  40 mg Subcutaneous Q24H Chotiner, Claudean Severance, MD   40 mg at 06/09/20 0924  . feeding supplement (GLUCERNA SHAKE) (GLUCERNA SHAKE) liquid 237 mL  237 mL Oral BID BM Alwyn Ren, MD   237 mL at 06/10/20 0845  . HYDROcodone-acetaminophen (NORCO/VICODIN) 5-325 MG per tablet 1-2 tablet  1-2 tablet Oral Q6H PRN Chotiner, Claudean Severance, MD   1 tablet at 06/08/20 1051  . HYDROmorphone (DILAUDID) injection 0.5 mg  0.5 mg Intravenous Q3H PRN Chotiner, Claudean Severance, MD      . insulin aspart (novoLOG) injection 0-5 Units   0-5 Units Subcutaneous QHS Chotiner, Claudean Severance, MD      . insulin aspart (novoLOG) injection 0-9 Units  0-9 Units Subcutaneous TID WC Chotiner, Claudean Severance, MD   2 Units at 06/09/20 1237  . lactated ringers infusion   Intravenous Continuous Chotiner, Claudean Severance, MD 100 mL/hr at 06/10/20 0730 Infusion Verify at 06/10/20 0730  . lidocaine (LIDODERM) 5 % 1 patch  1 patch Transdermal Daily Alwyn Ren, MD   1 patch at 06/09/20 [id number]  . LORazepam (ATIVAN) injection 0.5 mg  0.5 mg Intravenous QHS Alwyn Ren, MD   0.5 mg at 06/09/20 2320  . LORazepam (ATIVAN) tablet 0.5 mg  0.5 mg Oral Q8H PRN Alwyn Ren, MD      . LORazepam (ATIVAN) tablet 1 mg  1 mg Oral Once Horton, Mayer Masker, MD      . metFORMIN (GLUCOPHAGE) tablet 500 mg  500 mg Oral BID WC Alwyn Ren, MD   500 mg at 06/10/20 0847  . mupirocin ointment (BACTROBAN) 2 % 1 application  1 application Nasal BID Chotiner, Claudean Severance, MD   1 application at 06/09/20 0930  . OLANZapine (ZYPREXA) tablet 2.5 mg  2.5 mg Oral QHS Alwyn Ren, MD      . senna-docusate (Senokot-S) tablet 2 tablet  2 tablet Oral BID Chotiner, Claudean Severance, MD   2 tablet at 06/10/20 0847  . tamsulosin (FLOMAX) capsule 0.4 mg  0.4 mg Oral QHS Chotiner, Claudean Severance, MD   0.4 mg at 06/08/20 2146     Discharge Medications: Please see discharge summary for a list of discharge medications.  Relevant Imaging Results:  Relevant Lab Results:   Additional Information SSN: [id number]  Ewing Schlein, LCSW

## 2020-06-10 NOTE — Plan of Care (Signed)
RN went to patient room. Patient was asleep. Called patient opened his eyes and spit on RN. Pt refused to take his medications.

## 2020-06-10 NOTE — Progress Notes (Signed)
Report called to [facility] spoke with Marquita. Pt going to room 206. Sharrell Ku RN

## 2020-06-10 NOTE — Discharge Summary (Addendum)
Physician Discharge Summary  Gurjit Loconte RUE:[id number] DOB: 05/24/1931 DOA: 06/06/2020  PCP: Patient, No Pcp Per  Admit date: 06/06/2020 Discharge date: 06/10/2020  Admitted From: Nursing home  disposition: Nursing home Recommendations for Outpatient Follow-up:  1. Follow up with PCP in 1-2 weeks 2. Please obtain BMP/CBC in one week 3. Please follow up with orthopedics Dr. Jena Gauss in 3 weeks 4. Keep the left leg immobilizer on at all times 5. Follow-up x-ray of the left hip and the left knee and the left leg in 2 weeks  Home Health patient is being discharged to nursing home and will need physical therapy at the nursing home Equipment/Devices: None  Discharge Condition stable CODE STATUS DNR  diet recommendation: Regular diet Brief/Interim Summary:87 y.o.malewith medical history significant forDMT2, HTN,paroxysmal atrial fibrillation, Alzheimer's dementia who presents from home SNF after a fall yesterday. Reportedly patient is normally oriented to himself at baseline.He complained of pain in his left thigh and hip at the facility and was not able to stand so he was sent to the hospital for evaluation. He was found to have a femoral neck fracture on the left and a distal femur fracture on the left. There is no report of any loss of consciousness headache, head injury, nausea, vomiting, diarrhea. Patient unable to give any history on his own. Family is not present.Reportedly the ER physician discussed with the family earlier that the patient did have a hip fracture and the family wanted to have orthopedic evaluation and discuss treatment options with orthopedics before planning or consenting to surgery.  Discharge Diagnoses:  Principal Problem:   Fracture of femoral neck, left, closed (HCC) Active Problems:   Alzheimer's dementia (HCC)   Closed fracture of left distal femur (HCC)   Essential hypertension   Diabetes mellitus type 2 in nonobese (HCC)   Fracture of femoral  neck, left (HCC)     #1Left distal femur fracture and left possible femoral neck fracture-patient was seen by Ortho Dr. Jena Gauss.  Plan was to continue with nonsurgical treatment of fractures.  They recommended to repeat x-ray of the left hip and the left knee in 2 to 3 weeks.  Patient has not walked for 3 to 4 years.  He is bedbound and wheelchair-bound.  He has severe dementia.  Continue Tylenol around-the-clock.  #2 HTN continue Norvasc.  #3 type 2 DM continue Metformin 500 twice daily   #4 dementia-he has severe end-stage dementia and sundowning.  Wife reports he does well with Ativan.  #5 possible aspiration pneumonia-chest x-ray with infiltrates along the left heart border.  He was seen by speech therapy with recommendations for regular diet and thin liquids.  Patient at risk for mild aspiration due to cognitive impairment.  Continue Augmentin for 5 days.  Nutrition Problem: Increased nutrient needs Etiology: acute illness    Signs/Symptoms: estimated needs     Interventions: Prostat,Glucerna shake  Estimated body mass index is 19.26 kg/m as calculated from the following:   Height as of this encounter: 6' (1.829 m).   Weight as of this encounter: 64.4 kg.  Discharge Instructions  Discharge Instructions    Call MD for:  difficulty breathing, headache or visual disturbances   Complete by: As directed    Call MD for:  persistant nausea and vomiting   Complete by: As directed    Diet - low sodium heart healthy   Complete by: As directed    Increase activity slowly   Complete by: As directed      Allergies  as of 06/10/2020      Reactions   Olanzapine    Possible neuroleptic malignant syndrome      Medication List    STOP taking these medications   acetaminophen 325 MG tablet Commonly known as: TYLENOL     TAKE these medications   amLODipine 5 MG tablet Commonly known as: NORVASC Take 5 mg by mouth in the morning.   amoxicillin-clavulanate 875-125 MG  tablet Commonly known as: AUGMENTIN Take 1 tablet by mouth every 12 (twelve) hours.   apixaban 5 MG Tabs tablet Commonly known as: ELIQUIS Take 1 tablet (5 mg total) by mouth 2 (two) times daily.   citalopram 10 MG tablet Commonly known as: CELEXA Take 15 mg by mouth in the morning.   diclofenac Sodium 1 % Gel Commonly known as: VOLTAREN Apply 2 g topically See admin instructions. Apply 2 grams to both shoulders two times a day   divalproex 125 MG capsule Commonly known as: DEPAKOTE SPRINKLE Take 250 mg by mouth in the morning and at bedtime.   donepezil 5 MG tablet Commonly known as: Aricept Take 1 tablet (5 mg total) by mouth at bedtime.   lidocaine 5 % Commonly known as: LIDODERM Place 1 patch onto the skin daily. Remove & Discard patch within 12 hours or as directed by MD   LORazepam 0.5 MG tablet Commonly known as: ATIVAN Take 0.5 mg by mouth every 8 (eight) hours as needed (violent behavior).   metFORMIN 500 MG tablet Commonly known as: GLUCOPHAGE Take 500 mg by mouth 2 (two) times daily.   multivitamin with minerals tablet Take 1 tablet by mouth daily.   NONFORMULARY OR COMPOUNDED ITEM Apply 1 application topically See admin instructions. ABH compounded gel: Lorazepam 0.5 mg/Diphenhydramine HCl 25 mg/Haloperidol 0.5 mg/ml and Lipoderm gel- Apply to skin every 12 hours AND an additional application every 8 hours as needed for restlessness or agitation   OLANZapine 2.5 MG tablet Commonly known as: ZYPREXA Take 2.5 mg by mouth at bedtime.   polyethylene glycol powder 17 GM/SCOOP powder Commonly known as: GLYCOLAX/MIRALAX Take 17 g by mouth daily.   potassium chloride 10 MEQ tablet Commonly known as: KLOR-CON Take 1 tablet (10 mEq total) by mouth daily.   sennosides-docusate sodium 8.6-50 MG tablet Commonly known as: SENOKOT-S Take 2 tablets by mouth 2 (two) times daily.   tamsulosin 0.4 MG Caps capsule Commonly known as: FLOMAX Take 0.4 mg by mouth at  bedtime.       Follow-up Information    Haddix, Gillie Manners, MD. Schedule an appointment as soon as possible for a visit in 3 week(s).   Specialty: Orthopedic Surgery Why: 2-3 weekks for repeat x-ray of left hip and left knee Contact information: [address] [place] [address] [phone]              Allergies  Allergen Reactions  . Olanzapine     Possible neuroleptic malignant syndrome    Consultations:  Ortho   Procedures/Studies: DG Chest 1 View  Result Date: 06/09/2020 CLINICAL DATA:  Cough.  Dementia.  Recent femur fracture. EXAM: CHEST  1 VIEW COMPARISON:  01/29/2020 FINDINGS: Midline trachea. Normal heart size. No pleural effusion or pneumothorax. Clear right lung. Increased density along the left heart border which is felt to be slightly increased compared to the prior exams. IMPRESSION: Increased density along the left heart border, increased over prior exams. Suspicious for mild infection or aspiration. Consider radiographic follow-up in 2-3 days. Electronically Signed  By: Jeronimo GreavesKyle  Talbot M.D.   On: 06/09/2020 09:06   CT HIP LEFT WO CONTRAST  Result Date: 06/06/2020 CLINICAL DATA:  Left leg pain following fall with findings suggestive of subcapital femoral neck fracture on plain film EXAM: CT OF THE LEFT HIP WITHOUT CONTRAST TECHNIQUE: Multidetector CT imaging of the left hip was performed according to the standard protocol. Multiplanar CT image reconstructions were also generated. COMPARISON:  None. FINDINGS: Bones/Joint/Cartilage Diffuse osteoporosis is noted the cortical irregularity on the lateral aspect of the femoral neck in the subcapital region is noted similar to that seen on prior plain film. These changes are highly suspicious for an incomplete subcapital femoral neck fracture. No other femur fracture is seen. The remainder of the pelvic structures show no acute fracture. Degenerative changes of the lumbar spine are noted. Ligaments Suboptimally  assessed by CT. Muscles and Tendons Surrounding musculature is within normal limits. Soft tissues Soft tissue structures of the pelvis and upper left thigh show no acute abnormality. IMPRESSION: Cortical irregularity along the lateral aspect of the femoral neck similar to that seen on prior plain film examination. These changes are highly suspicious for incomplete subcapital femoral neck fracture. No other fractures are seen. Limited hip protocol MRI may be helpful for further evaluation. No other focal abnormality is seen. Electronically Signed   By: Alcide CleverMark  Lukens M.D.   On: 06/06/2020 22:58   DG Knee Complete 4 Views Left  Result Date: 06/06/2020 CLINICAL DATA:  Recent fall with left knee pain, initial encounter EXAM: LEFT KNEE - COMPLETE 4+ VIEW COMPARISON:  None. FINDINGS: There is an undisplaced distal femoral fracture involving the metaphysis without significant extension into the joint space. Degenerative changes of the knee joint are seen. Small joint effusion is noted. IMPRESSION: Undisplaced distal femoral metaphyseal fracture. Electronically Signed   By: Alcide CleverMark  Lukens M.D.   On: 06/06/2020 20:28   DG Hip Unilat W or Wo Pelvis 2-3 Views Left  Result Date: 06/06/2020 CLINICAL DATA:  Fall yesterday, pain EXAM: DG HIP (WITH OR WITHOUT PELVIS) 2-3V LEFT COMPARISON:  None. FINDINGS: Technically very limited radiographs. Osteopenia. Impacted subcapital fracture of the left femoral neck. No other obvious fracture of the pelvis or proximal right femur. IMPRESSION: Impacted subcapital fracture of the left femoral neck. No other obvious fracture of the pelvis or proximal right femur. Osteopenia. Electronically Signed   By: Lauralyn PrimesAlex  Bibbey M.D.   On: 06/06/2020 20:28    (Echo, Carotid, EGD, Colonoscopy, ERCP)    Subjective: Patient resting in bed no events overnight cough better did not hear him cough while I was in the room.  Discharge Exam: Vitals:   06/10/20 0559 06/10/20 0601  BP: (!) 170/78 (!)  169/69  Pulse: 73 62  Resp: 16   Temp: 98.8 F (37.1 C)   SpO2: 100% 100%   Vitals:   06/09/20 0501 06/09/20 2122 06/10/20 0559 06/10/20 0601  BP: (!) 154/83 (!) 154/70 (!) 170/78 (!) 169/69  Pulse: 79 67 73 62  Resp: 16 16 16    Temp: 98.1 F (36.7 C) 97.7 F (36.5 C) 98.8 F (37.1 C)   TempSrc: Oral Oral    SpO2: 99% 100% 100% 100%  Weight:      Height:        General: Pt is alert, awake, not in acute distress Cardiovascular: RRR, S1/S2 +, no rubs, no gallops Respiratory: CTA bilaterally, no wheezing, no rhonchi Abdominal: Soft, NT, ND, bowel sounds + Extremities: Left leg knee immobilizer in place.  The results of significant diagnostics from this hospitalization (including imaging, microbiology, ancillary and laboratory) are listed below for reference.     Microbiology: Recent Results (from the past 240 hour(s))  Resp Panel by RT-PCR (Flu A&B, Covid) Nasopharyngeal Swab     Status: None   Collection Time: 06/06/20 10:11 PM   Specimen: Nasopharyngeal Swab; Nasopharyngeal(NP) swabs in vial transport medium  Result Value Ref Range Status   SARS Coronavirus 2 by RT PCR NEGATIVE NEGATIVE Final    Comment: (NOTE) SARS-CoV-2 target nucleic acids are NOT DETECTED.  The SARS-CoV-2 RNA is generally detectable in upper respiratory specimens during the acute phase of infection. The lowest concentration of SARS-CoV-2 viral copies this assay can detect is 138 copies/mL. A negative result does not preclude SARS-Cov-2 infection and should not be used as the sole basis for treatment or other patient management decisions. A negative result may occur with  improper specimen collection/handling, submission of specimen other than nasopharyngeal swab, presence of viral mutation(s) within the areas targeted by this assay, and inadequate number of viral copies(<138 copies/mL). A negative result must be combined with clinical observations, patient history, and  epidemiological information. The expected result is Negative.  Fact Sheet for Patients:  BloggerCourse.com  Fact Sheet for Healthcare Providers:  SeriousBroker.it  This test is no t yet approved or cleared by the [place] FDA and  has been authorized for detection and/or diagnosis of SARS-CoV-2 by FDA under an Emergency Use Authorization (EUA). This EUA will remain  in effect (meaning this test can be used) for the duration of the COVID-19 declaration under Section 564(b)(1) of the Act, 21 U.S.C.section 360bbb-3(b)(1), unless the authorization is terminated  or revoked sooner.       Influenza A by PCR NEGATIVE NEGATIVE Final   Influenza B by PCR NEGATIVE NEGATIVE Final    Comment: (NOTE) The Xpert Xpress SARS-CoV-2/FLU/RSV plus assay is intended as an aid in the diagnosis of influenza from Nasopharyngeal swab specimens and should not be used as a sole basis for treatment. Nasal washings and aspirates are unacceptable for Xpert Xpress SARS-CoV-2/FLU/RSV testing.  Fact Sheet for Patients: BloggerCourse.com  Fact Sheet for Healthcare Providers: SeriousBroker.it  This test is not yet approved or cleared by the [place] FDA and has been authorized for detection and/or diagnosis of SARS-CoV-2 by FDA under an Emergency Use Authorization (EUA). This EUA will remain in effect (meaning this test can be used) for the duration of the COVID-19 declaration under Section 564(b)(1) of the Act, 21 U.S.C. section 360bbb-3(b)(1), unless the authorization is terminated or revoked.  Performed at [facility], [address] [place], [place] [address]      Labs: BNP (last 3 results) No results for input(s): BNP in the last 8760 hours. Basic Metabolic Panel: Recent Labs  Lab 06/06/20 2330 06/07/20 0327  NA 139 139  K 4.4 4.0  CL 103 102  CO2 28 28   GLUCOSE 96 132*  BUN 18 18  CREATININE 0.88 0.74  CALCIUM 9.1 8.8*   Liver Function Tests: No results for input(s): AST, ALT, ALKPHOS, BILITOT, PROT, ALBUMIN in the last 168 hours. No results for input(s): LIPASE, AMYLASE in the last 168 hours. No results for input(s): AMMONIA in the last 168 hours. CBC: Recent Labs  Lab 06/06/20 2330 06/07/20 0327  WBC 9.7 8.8  NEUTROABS 6.5  --   HGB 12.3* 12.1*  HCT 39.7 38.6*  MCV 95.7 94.6  PLT 234 230   Cardiac Enzymes: No  results for input(s): CKTOTAL, CKMB, CKMBINDEX, TROPONINI in the last 168 hours. BNP: Invalid input(s): POCBNP CBG: Recent Labs  Lab 06/09/20 0742 06/09/20 1129 06/09/20 1639 06/09/20 2120 06/10/20 0812  GLUCAP 118* 159* 116* 136* 92   D-Dimer No results for input(s): DDIMER in the last 72 hours. Hgb A1c No results for input(s): HGBA1C in the last 72 hours. Lipid Profile No results for input(s): CHOL, HDL, LDLCALC, TRIG, CHOLHDL, LDLDIRECT in the last 72 hours. Thyroid function studies No results for input(s): TSH, T4TOTAL, T3FREE, THYROIDAB in the last 72 hours.  Invalid input(s): FREET3 Anemia work up No results for input(s): VITAMINB12, FOLATE, FERRITIN, TIBC, IRON, RETICCTPCT in the last 72 hours. Urinalysis    Component Value Date/Time   COLORURINE STRAW (A) 01/29/2020 1228   APPEARANCEUR CLEAR 01/29/2020 1228   LABSPEC 1.012 01/29/2020 1228   PHURINE 7.0 01/29/2020 1228   GLUCOSEU >=500 (A) 01/29/2020 1228   HGBUR NEGATIVE 01/29/2020 1228   BILIRUBINUR NEGATIVE 01/29/2020 1228   KETONESUR 5 (A) 01/29/2020 1228   PROTEINUR NEGATIVE 01/29/2020 1228   NITRITE NEGATIVE 01/29/2020 1228   LEUKOCYTESUR NEGATIVE 01/29/2020 1228   Sepsis Labs Invalid input(s): PROCALCITONIN,  WBC,  LACTICIDVEN Microbiology Recent Results (from the past 240 hour(s))  Resp Panel by RT-PCR (Flu A&B, Covid) Nasopharyngeal Swab     Status: None   Collection Time: 06/06/20 10:11 PM   Specimen: Nasopharyngeal Swab;  Nasopharyngeal(NP) swabs in vial transport medium  Result Value Ref Range Status   SARS Coronavirus 2 by RT PCR NEGATIVE NEGATIVE Final    Comment: (NOTE) SARS-CoV-2 target nucleic acids are NOT DETECTED.  The SARS-CoV-2 RNA is generally detectable in upper respiratory specimens during the acute phase of infection. The lowest concentration of SARS-CoV-2 viral copies this assay can detect is 138 copies/mL. A negative result does not preclude SARS-Cov-2 infection and should not be used as the sole basis for treatment or other patient management decisions. A negative result may occur with  improper specimen collection/handling, submission of specimen other than nasopharyngeal swab, presence of viral mutation(s) within the areas targeted by this assay, and inadequate number of viral copies(<138 copies/mL). A negative result must be combined with clinical observations, patient history, and epidemiological information. The expected result is Negative.  Fact Sheet for Patients:  BloggerCourse.com  Fact Sheet for Healthcare Providers:  SeriousBroker.it  This test is no t yet approved or cleared by the [place] FDA and  has been authorized for detection and/or diagnosis of SARS-CoV-2 by FDA under an Emergency Use Authorization (EUA). This EUA will remain  in effect (meaning this test can be used) for the duration of the COVID-19 declaration under Section 564(b)(1) of the Act, 21 U.S.C.section 360bbb-3(b)(1), unless the authorization is terminated  or revoked sooner.       Influenza A by PCR NEGATIVE NEGATIVE Final   Influenza B by PCR NEGATIVE NEGATIVE Final    Comment: (NOTE) The Xpert Xpress SARS-CoV-2/FLU/RSV plus assay is intended as an aid in the diagnosis of influenza from Nasopharyngeal swab specimens and should not be used as a sole basis for treatment. Nasal washings and aspirates are unacceptable for Xpert Xpress  SARS-CoV-2/FLU/RSV testing.  Fact Sheet for Patients: BloggerCourse.com  Fact Sheet for Healthcare Providers: SeriousBroker.it  This test is not yet approved or cleared by the [place] FDA and has been authorized for detection and/or diagnosis of SARS-CoV-2 by FDA under an Emergency Use Authorization (EUA). This EUA will remain in effect (meaning this test can be used)  for the duration of the COVID-19 declaration under Section 564(b)(1) of the Act, 21 U.S.C. section 360bbb-3(b)(1), unless the authorization is terminated or revoked.  Performed at [facility], [address], [place], [place] [address]      Time coordinating discharge:  39 minutes  SIGNED:   Alwyn Ren, MD  [facility] 06/10/2020, 9:10 AM

## 2020-06-10 NOTE — Plan of Care (Signed)
Pt disoriented to time and situation. Pt shows no signs of pain and has been resting the majority of the night. During assessment the pt states, "I am going to kick you, you are not my nurse" and was calm following the comment with no further incidences. VS normal, no fever, SOB, or signs of distress.

## 2020-06-10 NOTE — TOC Transition Note (Signed)
Transition of Care [facility]) - CM/SW Discharge Note  Patient Details  Name: Jeffery Haynes MRN: [id number] Date of Birth: 1931-08-02  [facility] of [facility] CM/SW Contact:  Ewing Schlein, LCSW Phone Number: 06/10/2020, 12:47 PM  Clinical Narrative: Patient medically ready to discharge back to [facility] for SNF and then LTC. CSW confirmed with Tresa Endo with [facility] that patient can return today pending a negative COVID test. Discharge summary, discharge orders, PT notes, and FL2 faxed in hub to [facility].  Patient will go to room 206 and the number to call for report is [phone]. Discharge packet completed. PTAR scheduled; medical necessity form completed. RN updated. TOC signing off.  Final next level of care: Skilled Nursing Facility Barriers to Discharge: Barriers Resolved  Patient Goals and CMS Choice Patient states their goals for this hospitalization and ongoing recovery are:: Return to SNF bed at [facility] per wife CMS Medicare.gov Compare Post Acute Care list provided to:: Patient Represenative (must comment) Choice offered to / list presented to : Spouse  Discharge Placement       Patient chooses bed at: [facility] Patient to be transferred to facility by: PTAR Name of family member notified: Marchia Meiers Patient and family notified of of transfer: 06/10/20  Discharge Plan and Services In-house Referral: Clinical Social Work       DME Arranged: N/A DME Agency: NA  Readmission Risk Interventions No flowsheet data found.

## 2020-06-17 ENCOUNTER — Emergency Department (HOSPITAL_COMMUNITY): Payer: Medicare Other

## 2020-06-17 ENCOUNTER — Other Ambulatory Visit: Payer: Self-pay

## 2020-06-17 ENCOUNTER — Encounter (HOSPITAL_COMMUNITY): Payer: Self-pay

## 2020-06-17 ENCOUNTER — Emergency Department (HOSPITAL_COMMUNITY)
Admission: EM | Admit: 2020-06-17 | Discharge: 2020-06-18 | Disposition: A | Discharge status: Home / Self Care | Payer: Medicare Other | Attending: Emergency Medicine | Admitting: Emergency Medicine

## 2020-06-17 DIAGNOSIS — Z7984 Long term (current) use of oral hypoglycemic drugs: Secondary | ICD-10-CM | POA: Insufficient documentation

## 2020-06-17 DIAGNOSIS — X58XXXA Exposure to other specified factors, initial encounter: Secondary | ICD-10-CM | POA: Insufficient documentation

## 2020-06-17 DIAGNOSIS — G309 Alzheimer's disease, unspecified: Secondary | ICD-10-CM | POA: Insufficient documentation

## 2020-06-17 DIAGNOSIS — S72401D Unspecified fracture of lower end of right femur, subsequent encounter for closed fracture with routine healing: Secondary | ICD-10-CM | POA: Insufficient documentation

## 2020-06-17 DIAGNOSIS — Z7901 Long term (current) use of anticoagulants: Secondary | ICD-10-CM | POA: Insufficient documentation

## 2020-06-17 DIAGNOSIS — F0281 Dementia in other diseases classified elsewhere with behavioral disturbance: Secondary | ICD-10-CM | POA: Diagnosis not present

## 2020-06-17 DIAGNOSIS — Z79899 Other long term (current) drug therapy: Secondary | ICD-10-CM | POA: Diagnosis not present

## 2020-06-17 DIAGNOSIS — I10 Essential (primary) hypertension: Secondary | ICD-10-CM | POA: Diagnosis not present

## 2020-06-17 DIAGNOSIS — R52 Pain, unspecified: Secondary | ICD-10-CM

## 2020-06-17 DIAGNOSIS — S72002D Fracture of unspecified part of neck of left femur, subsequent encounter for closed fracture with routine healing: Secondary | ICD-10-CM

## 2020-06-17 LAB — URINALYSIS, ROUTINE W REFLEX MICROSCOPIC
Bilirubin Urine: NEGATIVE
Glucose, UA: NEGATIVE mg/dL
Hgb urine dipstick: NEGATIVE
Ketones, ur: NEGATIVE mg/dL
Leukocytes,Ua: NEGATIVE
Nitrite: NEGATIVE
Protein, ur: NEGATIVE mg/dL
Specific Gravity, Urine: 1.014 (ref 1.005–1.030)
pH: 6 (ref 5.0–8.0)

## 2020-06-17 LAB — CBC WITH DIFFERENTIAL/PLATELET
Abs Immature Granulocytes: 0.08 10*3/uL — ABNORMAL HIGH (ref 0.00–0.07)
Basophils Absolute: 0 10*3/uL (ref 0.0–0.1)
Basophils Relative: 1 %
Eosinophils Absolute: 0.1 10*3/uL (ref 0.0–0.5)
Eosinophils Relative: 2 %
HCT: 38.7 % — ABNORMAL LOW (ref 39.0–52.0)
Hemoglobin: 12.1 g/dL — ABNORMAL LOW (ref 13.0–17.0)
Immature Granulocytes: 1 %
Lymphocytes Relative: 23 %
Lymphs Abs: 1.7 10*3/uL (ref 0.7–4.0)
MCH: 29.3 pg (ref 26.0–34.0)
MCHC: 31.3 g/dL (ref 30.0–36.0)
MCV: 93.7 fL (ref 80.0–100.0)
Monocytes Absolute: 0.5 10*3/uL (ref 0.1–1.0)
Monocytes Relative: 7 %
Neutro Abs: 4.8 10*3/uL (ref 1.7–7.7)
Neutrophils Relative %: 66 %
Platelets: 359 10*3/uL (ref 150–400)
RBC: 4.13 MIL/uL — ABNORMAL LOW (ref 4.22–5.81)
RDW: 14 % (ref 11.5–15.5)
WBC: 7.2 10*3/uL (ref 4.0–10.5)
nRBC: 0 % (ref 0.0–0.2)

## 2020-06-17 LAB — COMPREHENSIVE METABOLIC PANEL
ALT: 12 U/L (ref 0–44)
AST: 26 U/L (ref 15–41)
Albumin: 3.5 g/dL (ref 3.5–5.0)
Alkaline Phosphatase: 84 U/L (ref 38–126)
Anion gap: 10 (ref 5–15)
BUN: 24 mg/dL — ABNORMAL HIGH (ref 8–23)
CO2: 26 mmol/L (ref 22–32)
Calcium: 9.2 mg/dL (ref 8.9–10.3)
Chloride: 103 mmol/L (ref 98–111)
Creatinine, Ser: 1 mg/dL (ref 0.61–1.24)
GFR, Estimated: >60 mL/min (ref >60)
Glucose, Bld: 148 mg/dL — ABNORMAL HIGH (ref 70–99)
Potassium: 5.5 mmol/L — ABNORMAL HIGH (ref 3.5–5.1)
Sodium: 139 mmol/L (ref 135–145)
Total Bilirubin: 1.3 mg/dL — ABNORMAL HIGH (ref 0.3–1.2)
Total Protein: 6.5 g/dL (ref 6.5–8.1)

## 2020-06-17 LAB — CBG MONITORING, ED: Glucose-Capillary: 153 mg/dL — ABNORMAL HIGH (ref 70–99)

## 2020-06-17 IMAGING — CR DG KNEE COMPLETE 4+V*L*
4 series · 4 of 4 positions shown · non-contrast
Comparison: X-ray left knee [DATE]

CLINICAL DATA: Status post fall. Pain to lateral side of hip and
knee

EXAM:
LEFT KNEE - COMPLETE 4+ VIEW

[x knee obl left (1 of 3)]
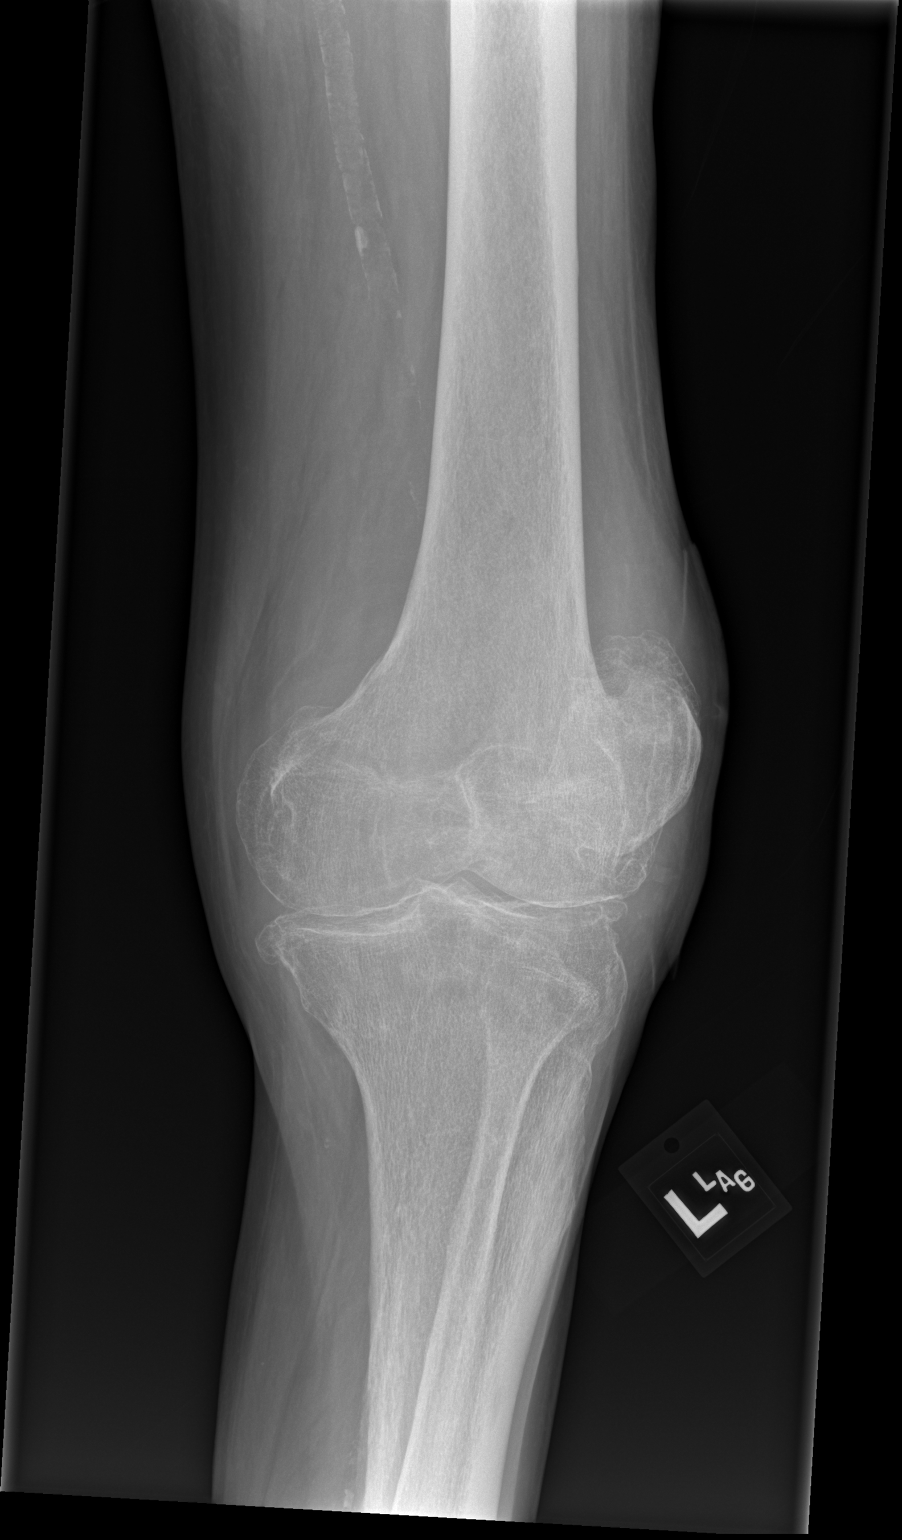

[x knee obl left (2 of 3)]
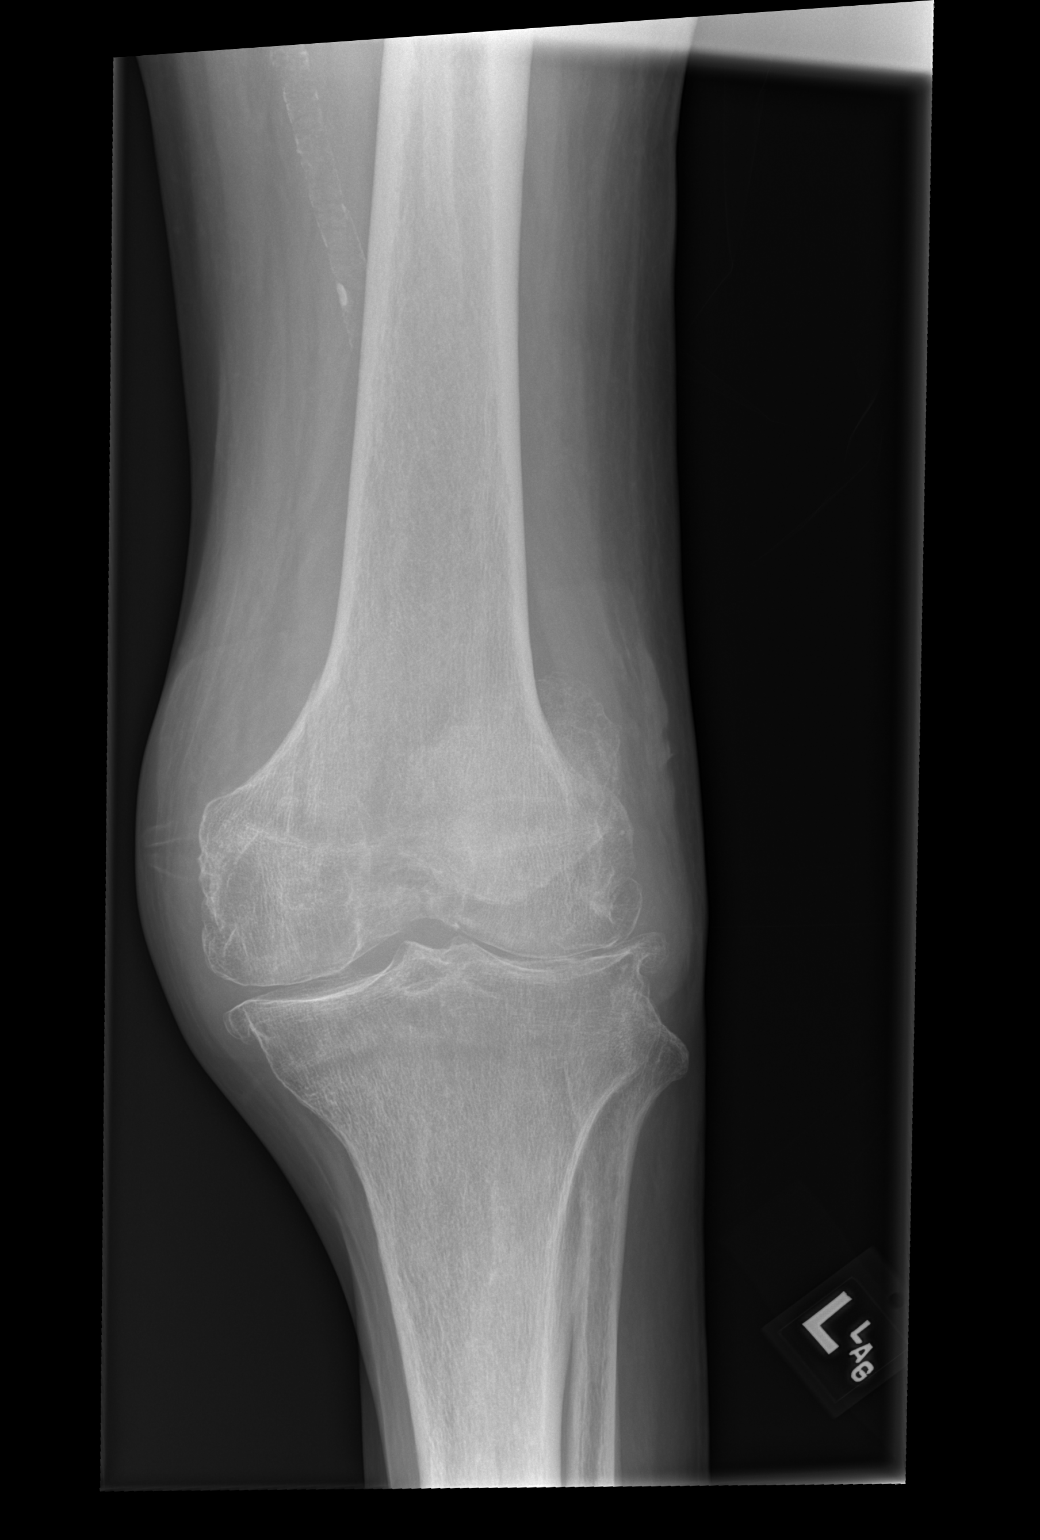

[x knee obl left (3 of 3)]
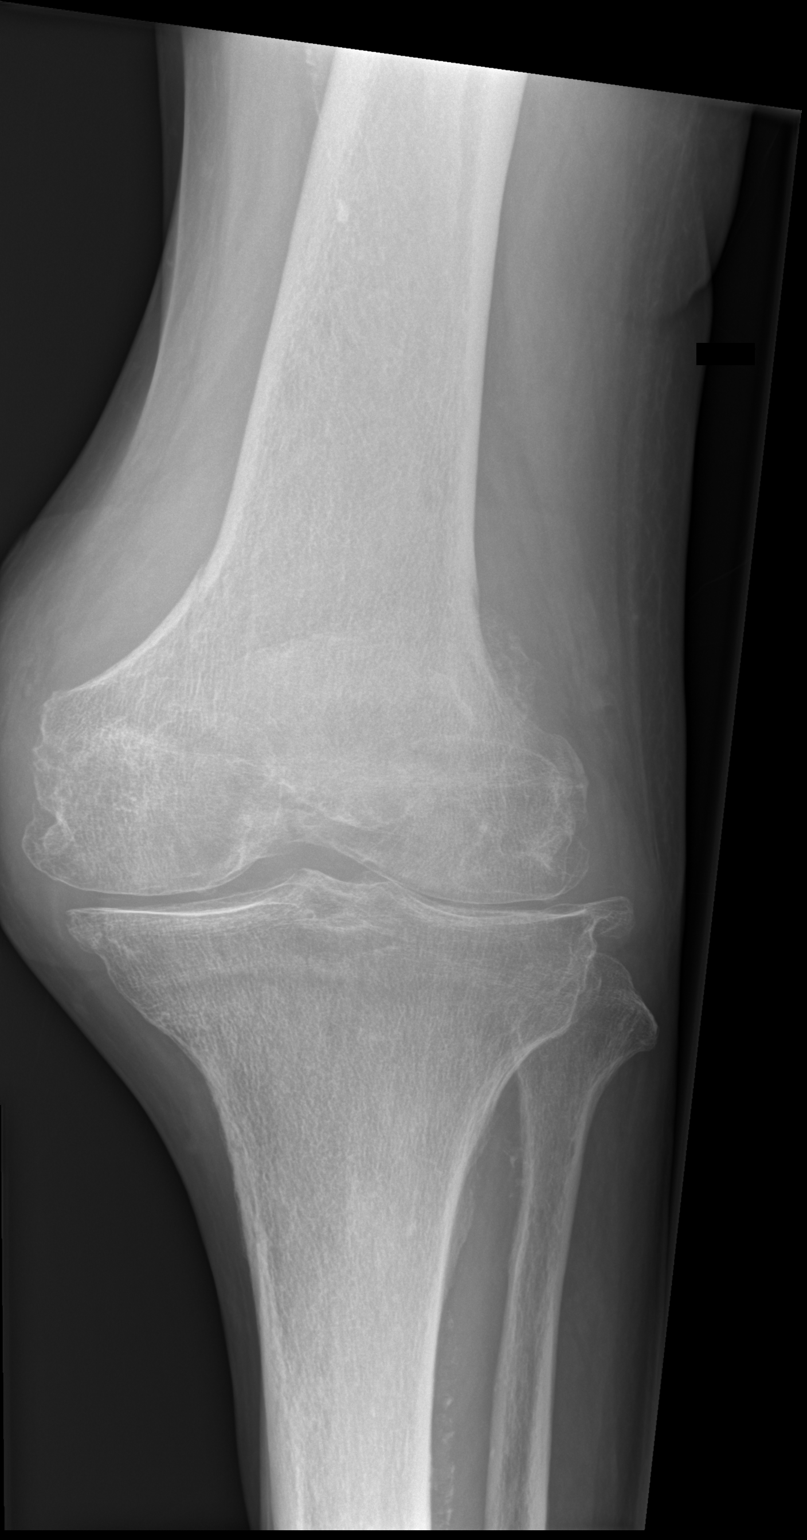

[x knee lat left]
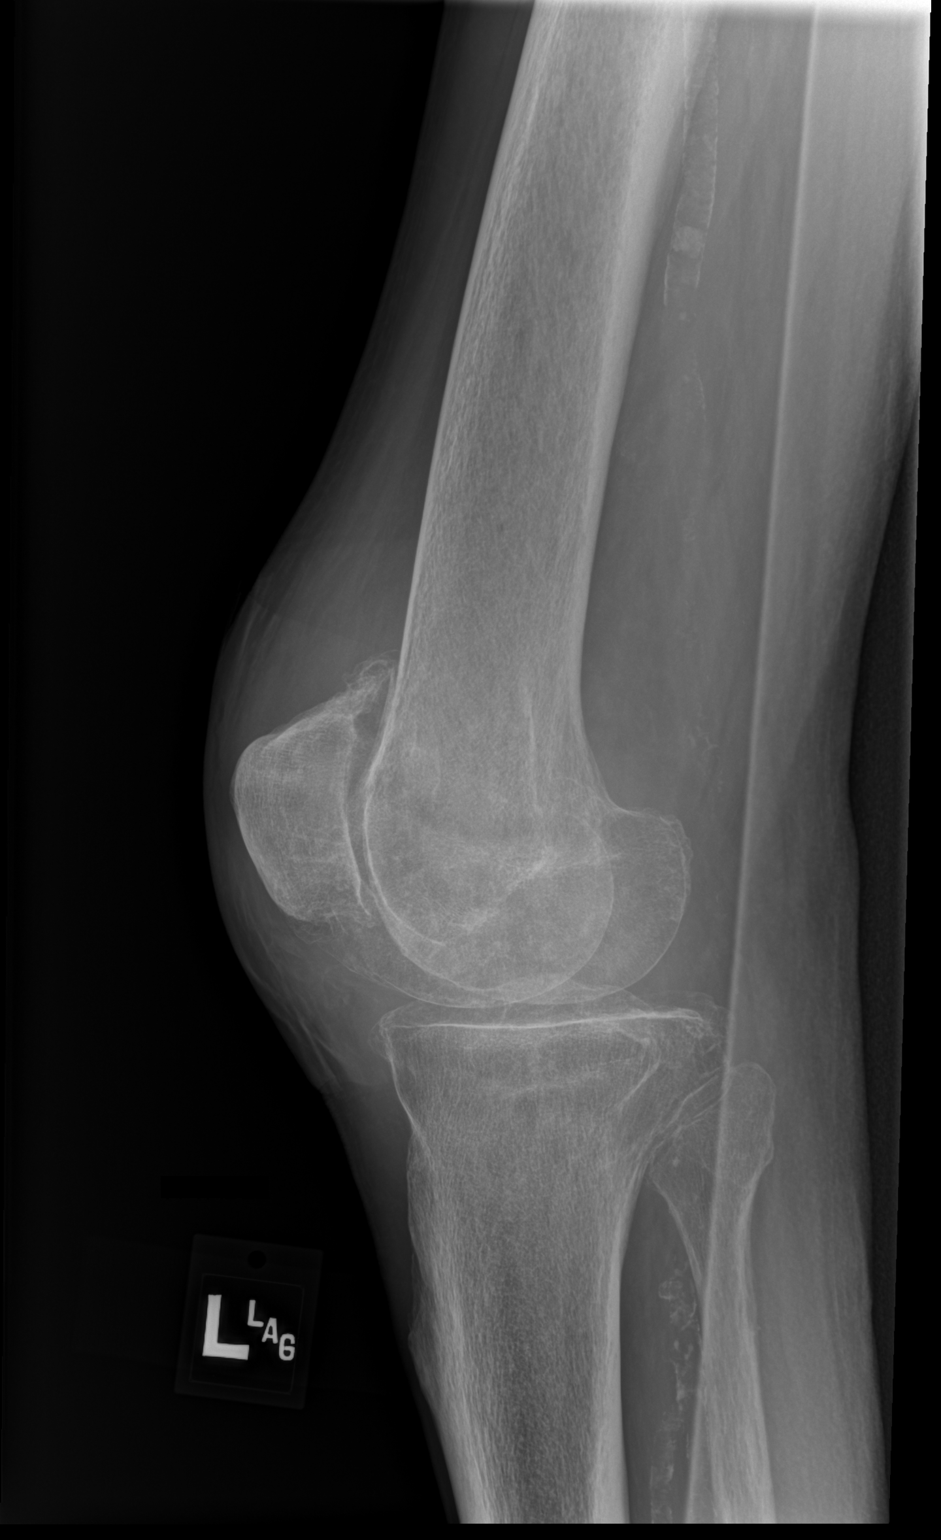

[4 of 4 positions shown; findings below may reference images not displayed]

FINDINGS: No evidence of acute fracture, dislocation, or joint effusion.
Healing nondisplaced distal femoral metadiaphyseal fracture.
Tricompartmental severe degenerative changes. Question mild
chondrocalcinosis of the medial tibiofemoral compartment. No other
aggressive focal bone abnormality. Soft tissues are unremarkable.
Vascular calcifications.
IMPRESSION: 1. No acute displaced fracture or dislocation patient with severe
tricompartmental degenerative changes of the left knee.
2. Healing nondisplaced distal femoral metadiaphyseal fracture.

## 2020-06-17 IMAGING — CR DG HIP (WITH OR WITHOUT PELVIS) 2-3V*L*
3 series · 3 of 3 positions shown · non-contrast
Comparison: [DATE] plain films and CT

CLINICAL DATA: Fall, left lateral hip pain

EXAM:
DG HIP (WITH OR WITHOUT PELVIS) 2-3V LEFT

[x pelvis]
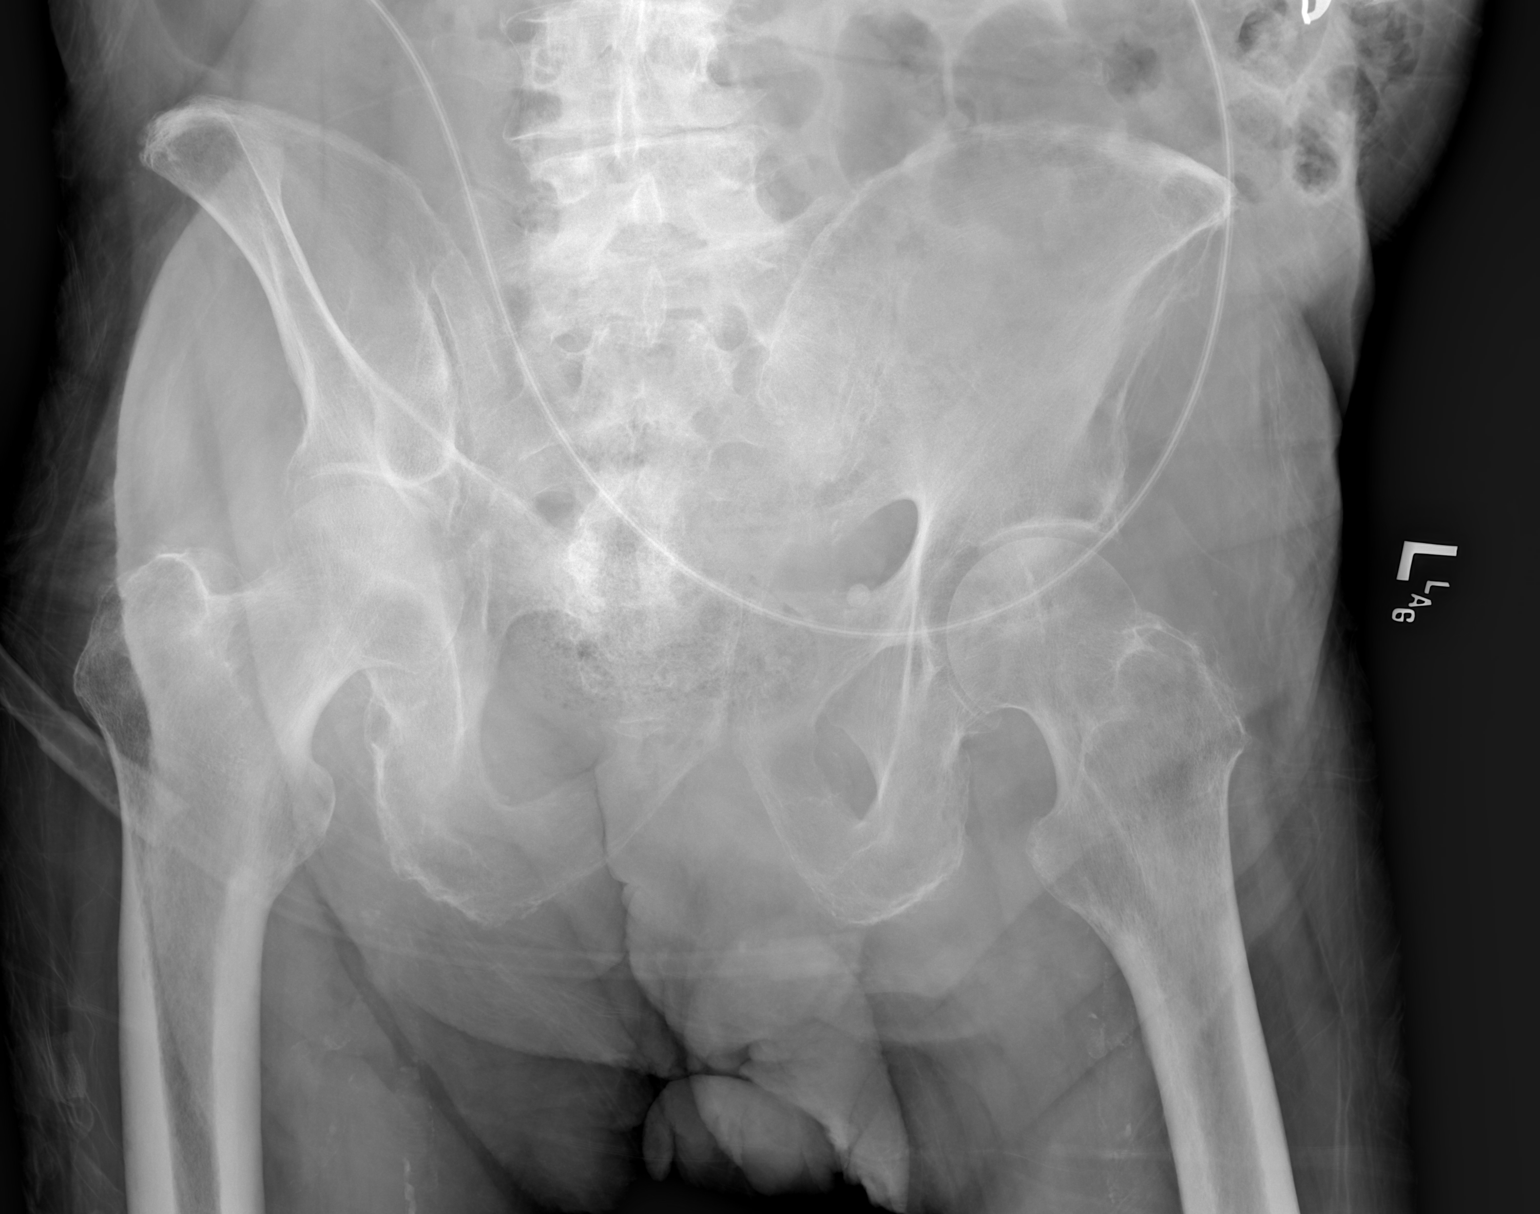

[x hip ap left]
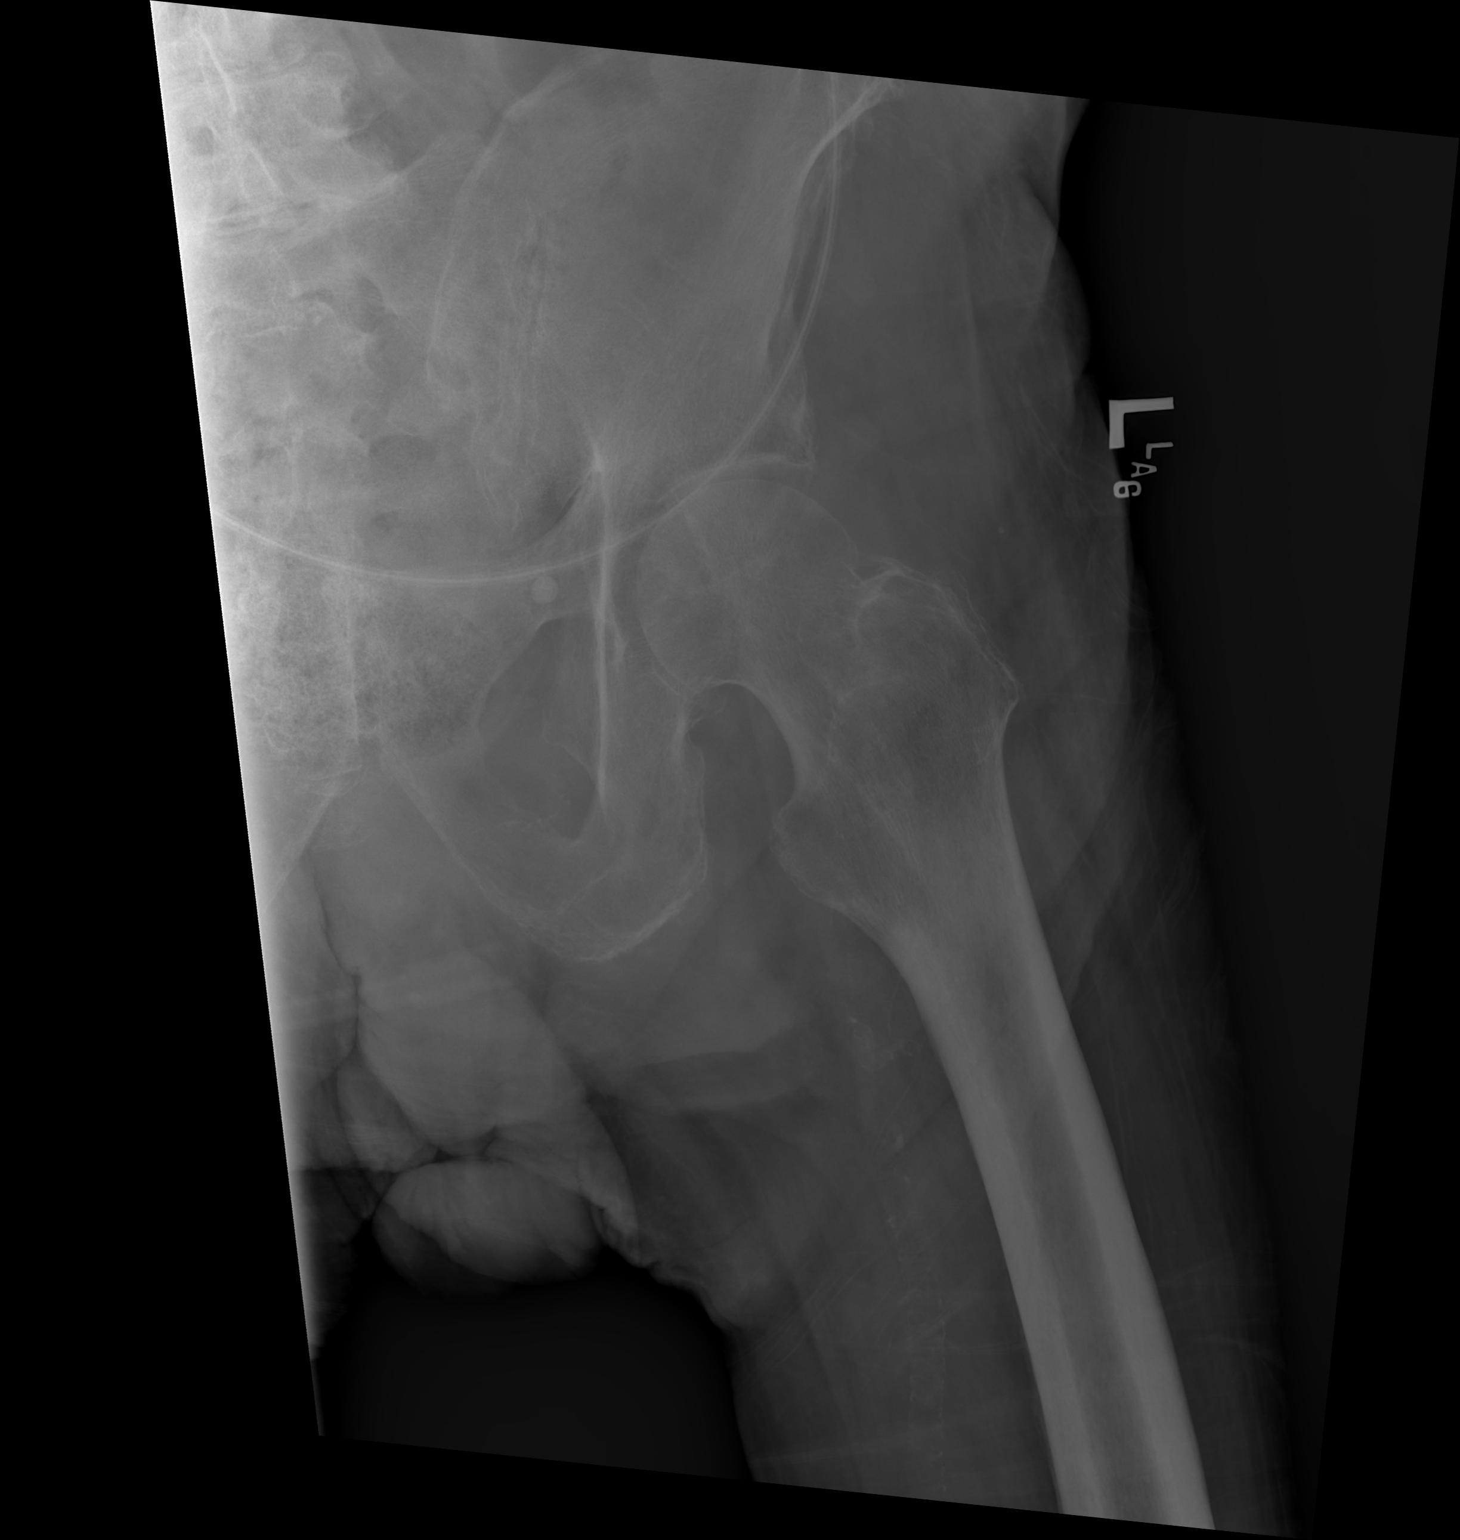

[w hip lat left]
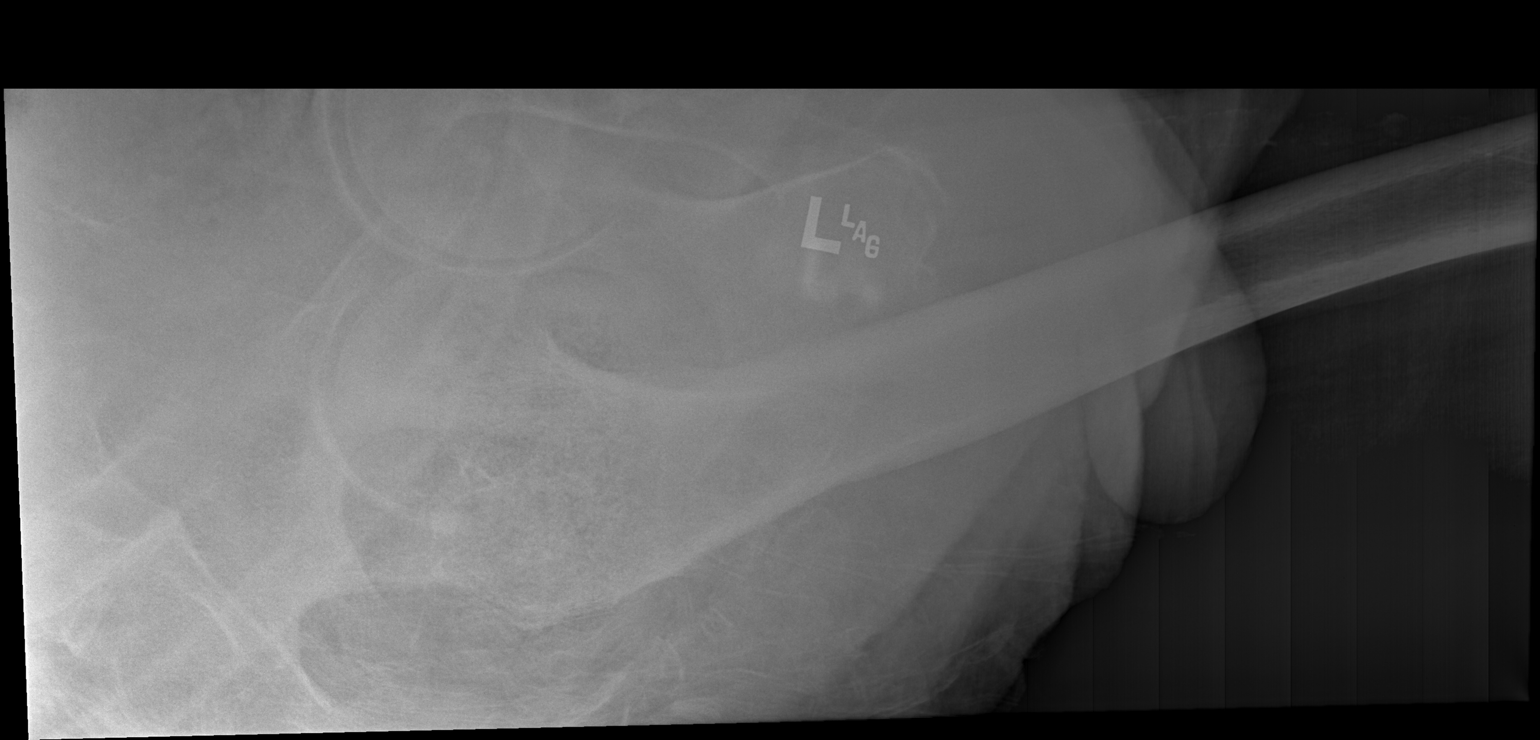

[3 of 3 positions shown; findings below may reference images not displayed]

FINDINGS: No definite fracture seen as question on prior plain film and CT.
There is slight cortical angulation noted laterally within the
femoral neck, but is symmetric to the right side. No subluxation or
dislocation.
IMPRESSION: Mild cortical angulation laterally, question to be femoral neck
fracture on prior study, but appears symmetric to the opposite side
on today's study. No definite fracture seen.

## 2020-06-17 IMAGING — DX DG CHEST 1V PORT
1 series · 1 of 1 positions shown · non-contrast
Comparison: [DATE]

CLINICAL DATA: Lethargy all day.  Altered mental status.

EXAM:
PORTABLE CHEST 1 VIEW

[chest ap]
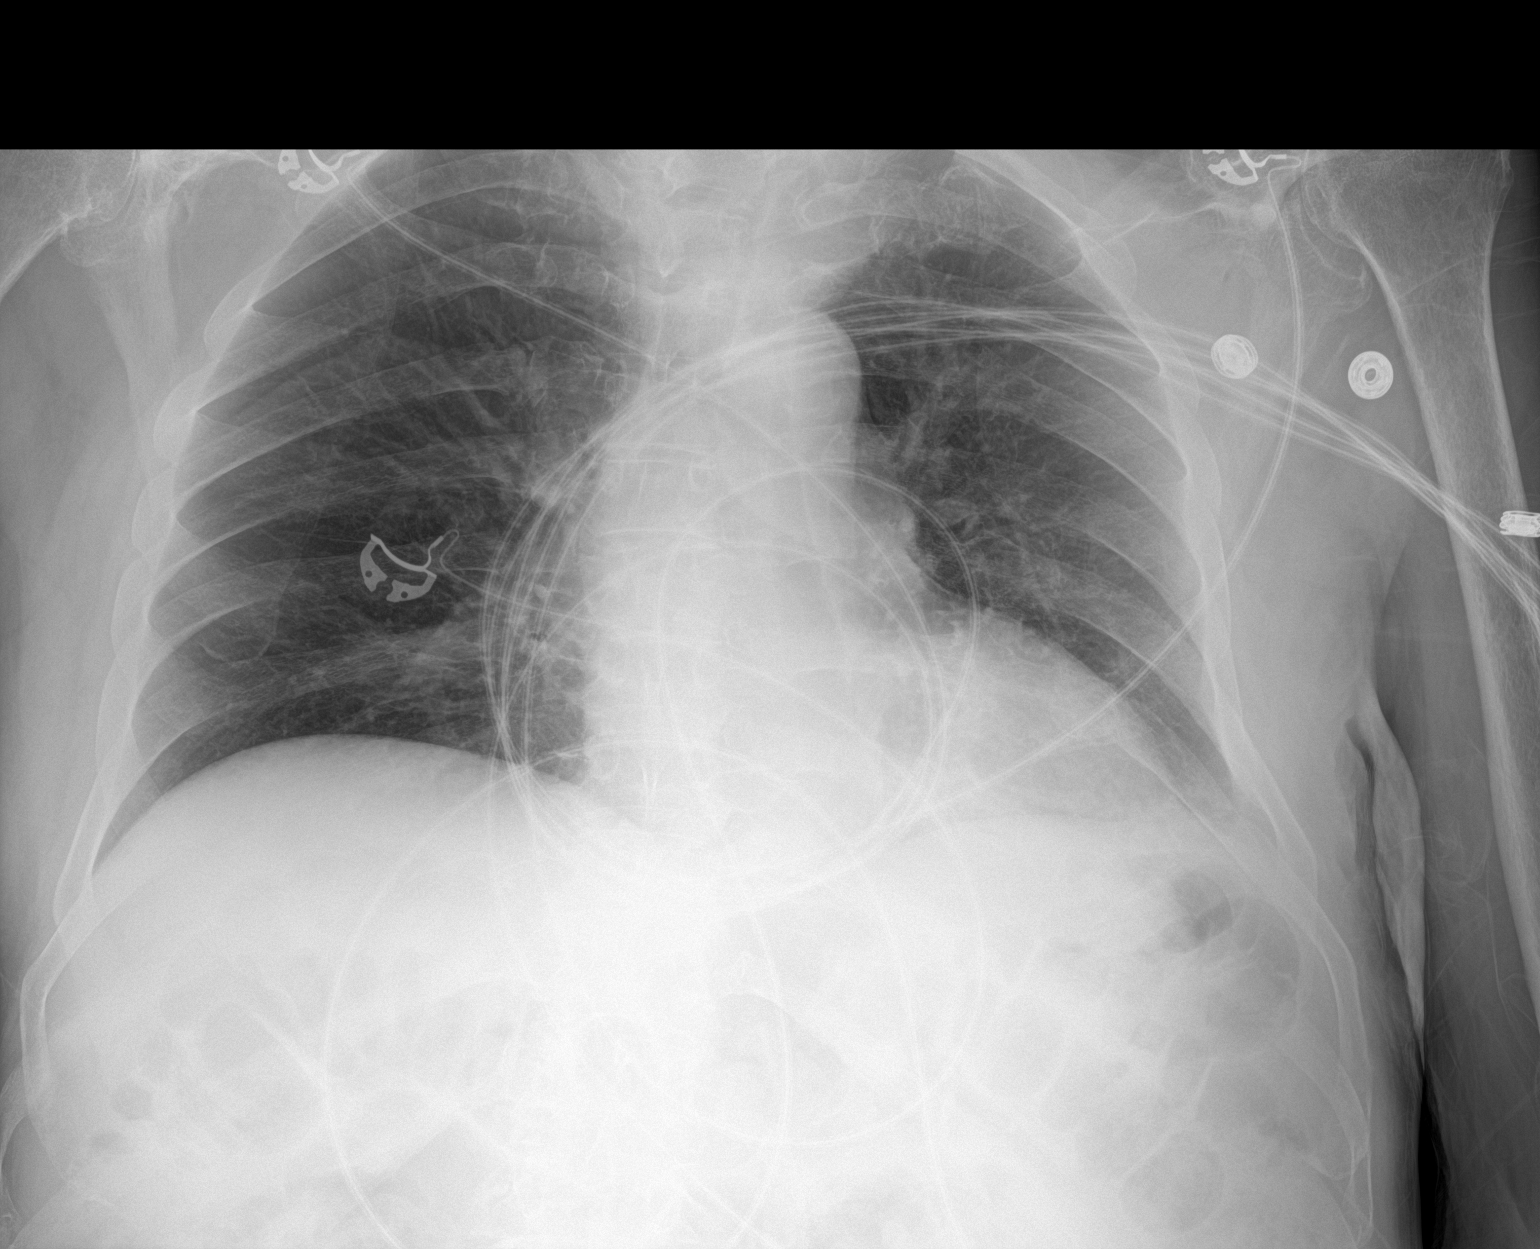

[1 of 1 positions shown; findings below may reference images not displayed]

FINDINGS: Shallow inspiration with linear atelectasis in the left lung base.
Previous infiltrate has improved. No pleural effusion or
pneumothorax. Heart size and pulmonary vascularity are normal.
IMPRESSION: Shallow inspiration with linear atelectasis in the left lung base.
Left basilar changes are improved since previous study.

## 2020-06-17 MED ORDER — MORPHINE SULFATE (PF) 4 MG/ML IV SOLN
4.0000 mg | Freq: Once | INTRAVENOUS | Status: AC
  Administered 2020-06-18: 4 mg via INTRAVENOUS
  Filled 2020-06-17: qty 1

## 2020-06-17 MED ORDER — ONDANSETRON HCL 4 MG/2ML IJ SOLN
4.0000 mg | Freq: Once | INTRAMUSCULAR | Status: AC
  Administered 2020-06-18: 4 mg via INTRAVENOUS
  Filled 2020-06-17: qty 2

## 2020-06-17 MED ORDER — SODIUM CHLORIDE 0.9 % IV BOLUS
1000.0000 mL | Freq: Once | INTRAVENOUS | Status: AC
  Administered 2020-06-17: 1000 mL via INTRAVENOUS

## 2020-06-17 NOTE — Discharge Instructions (Signed)
Please make sure pt is getting his pain medication as ordered.

## 2020-06-17 NOTE — ED Notes (Signed)
Bed alarm on.

## 2020-06-17 NOTE — ED Triage Notes (Signed)
Brought in by EMS from [facility]. Nursing home reports lethargy all day and that pt has been fighting them and spitting on them. He arrived sitting in urine due to his refusal for help at the facility. Hx of dementia and alzheimer's.

## 2020-06-17 NOTE — ED Provider Notes (Signed)
[facility]-EMERGENCY DEPT Provider Note   CSN: [id number] Arrival date & time: 06/17/20  2047     History Chief Complaint  Patient presents with  . Altered Mental Status    Jeffery Haynes is a 87 y.o. male.  Pt presents to the ED today with AMS.  The pt has severe dementia with a hx of significant sundowning.  He was brought here from the facility because he's been fighting the staff.  Pt unable to give any hx.  He was admitted from 3/25-29 for a fx hip which was treated non-operatively.  He has been nonambulatory for several years.          Past Medical History:  Diagnosis Date  . Alzheimer disease (HCC)   . Dementia (HCC)   . Diabetes mellitus without complication [facility](HCC)     Patient Active Problem List   Diagnosis Date Noted  . Alzheimer's dementia (HCC) 06/07/2020  . Fracture of femoral neck, left, closed (HCC) 06/07/2020  . Closed fracture of left distal femur (HCC) 06/07/2020  . Essential hypertension 06/07/2020  . Diabetes mellitus type 2 in nonobese (HCC) 06/07/2020  . Fracture of femoral neck, left (HCC) 06/07/2020  . Acute metabolic encephalopathy 01/29/2020  . Atrial fibrillation with RVR (HCC) 01/29/2020  . Alzheimer disease (HCC)   . Dementia (HCC)     History reviewed. No pertinent surgical history.     Family History  Problem Relation Age of Onset  . Seizures Neg Hx     Social History   Tobacco Use  . Smoking status: Never Smoker  . Smokeless tobacco: Never Used  Vaping Use  . Vaping Use: Never used    Home Medications Prior to Admission medications   Medication Sig Start Date End Date Taking? Authorizing Provider  acetaminophen (TYLENOL) 325 MG tablet Take 2 tablets (650 mg total) by mouth 4 (four) times daily. 06/10/20 07/10/20 Yes Alwyn RenMathews, Elizabeth G, MD  amLODipine (NORVASC) 5 MG tablet Take 5 mg by mouth in the morning.   Yes [provider]  apixaban (ELIQUIS) 5 MG TABS tablet Take 1 tablet (5 mg  total) by mouth 2 (two) times daily. 02/01/20 01/31/21 Yes Pokhrel, Laxman, MD  citalopram (CELEXA) 10 MG tablet Take 15 mg by mouth in the morning.   Yes [provider]  diclofenac Sodium (VOLTAREN) 1 % GEL Apply 2 g topically See admin instructions. Apply 2 grams to both shoulders two times a day   Yes [provider]  divalproex (DEPAKOTE SPRINKLE) 125 MG capsule Take 250 mg by mouth in the morning and at bedtime.   Yes [provider]  donepezil (ARICEPT) 5 MG tablet Take 1 tablet (5 mg total) by mouth at bedtime. 02/01/20 01/31/21 Yes Pokhrel, Laxman, MD  feeding supplement, GLUCERNA SHAKE, (GLUCERNA SHAKE) LIQD Take 237 mLs by mouth daily.   Yes [provider]  lidocaine (LIDODERM) 5 % Place 1 patch onto the skin daily. Remove & Discard patch within 12 hours or as directed by MD 06/10/20  Yes Alwyn RenMathews, Elizabeth G, MD  LORazepam (ATIVAN) 0.5 MG tablet Take 0.5 mg by mouth every 8 (eight) hours as needed (violent behavior).   Yes [provider]  metFORMIN (GLUCOPHAGE) 500 MG tablet Take 500 mg by mouth 2 (two) times daily.   Yes [provider]  Multiple Vitamins-Minerals (MULTIVITAMIN WITH MINERALS) tablet Take 1 tablet by mouth daily.   Yes [provider]  NONFORMULARY OR COMPOUNDED ITEM Apply 1 application topically See admin  instructions. ABH compounded gel: Lorazepam 0.5 mg/Diphenhydramine HCl 25 mg/Haloperidol 0.5 mg/ml and Lipoderm gel- Apply to skin every 12 hours AND an additional application every 12  hours as needed for restlessness or agitation   Yes [provider]  OLANZapine (ZYPREXA) 2.5 MG tablet Take 2.5 mg by mouth at bedtime. 05/25/20  Yes [provider]  polyethylene glycol powder (GLYCOLAX/MIRALAX) 17 GM/SCOOP powder Take 17 g by mouth daily.   Yes [provider]  potassium chloride (KLOR-CON) 10 MEQ tablet Take 1 tablet (10 mEq total) by mouth daily. 02/01/20  Yes Pokhrel, Laxman, MD   sennosides-docusate sodium (SENOKOT-S) 8.6-50 MG tablet Take 2 tablets by mouth 2 (two) times daily.   Yes [provider]  tamsulosin (FLOMAX) 0.4 MG CAPS capsule Take 0.4 mg by mouth at bedtime.   Yes [provider]    Allergies    Olanzapine  Review of Systems   Review of Systems  Unable to perform ROS: Dementia    Physical Exam Updated Vital Signs BP (!) 141/73   Pulse 78   Temp 98 F (36.7 C) (Oral)   Resp 13   Ht 6' (1.829 m)   Wt 65 kg   SpO2 99%   BMI 19.43 kg/m   Physical Exam Vitals and nursing note reviewed.  Constitutional:      Appearance: Normal appearance.  HENT:     Head: Normocephalic and atraumatic.     Right Ear: External ear normal.     Left Ear: External ear normal.     Nose: Nose normal.     Mouth/Throat:     Mouth: Mucous membranes are moist.     Pharynx: Oropharynx is clear.  Eyes:     Extraocular Movements: Extraocular movements intact.     Conjunctiva/sclera: Conjunctivae normal.     Pupils: Pupils are equal, round, and reactive to light.  Cardiovascular:     Rate and Rhythm: Normal rate and regular rhythm.     Pulses: Normal pulses.     Heart sounds: Normal heart sounds.  Pulmonary:     Effort: Pulmonary effort is normal.     Breath sounds: Normal breath sounds.  Abdominal:     General: Abdomen is flat. Bowel sounds are normal.     Palpations: Abdomen is soft.  Musculoskeletal:     Cervical back: Normal range of motion and neck supple.     Comments: Decreased rom to the left hip  Skin:    General: Skin is warm.     Capillary Refill: Capillary refill takes less than 2 seconds.  Neurological:     Mental Status: He is alert.     Comments: Pt alert and looking at me.  He is squeezing both hands.  He will move the right foot, but not the left     ED Results / Procedures / Treatments   Labs (all labs ordered are listed, but only abnormal results are displayed) Labs Reviewed  CBC WITH DIFFERENTIAL/PLATELET -  Abnormal; Notable for the following components:      Result Value   RBC 4.13 (*)    Hemoglobin 12.1 (*)    HCT 38.7 (*)    Abs Immature Granulocytes 0.08 (*)    All other components within normal limits  COMPREHENSIVE METABOLIC PANEL - Abnormal; Notable for the following components:   Potassium 5.5 (*)    Glucose, Bld 148 (*)    BUN 24 (*)    Total Bilirubin 1.3 (*)    All  other components within normal limits  CBG MONITORING, ED - Abnormal; Notable for the following components:   Glucose-Capillary 153 (*)    All other components within normal limits  URINE CULTURE  URINALYSIS, ROUTINE W REFLEX MICROSCOPIC    EKG None  Radiology DG Chest Portable 1 View  Result Date: 06/17/2020 CLINICAL DATA:  Lethargy all day.  Altered mental status. EXAM: PORTABLE CHEST 1 VIEW COMPARISON:  06/09/2020 FINDINGS: Shallow inspiration with linear atelectasis in the left lung base. Previous infiltrate has improved. No pleural effusion or pneumothorax. Heart size and pulmonary vascularity are normal. IMPRESSION: Shallow inspiration with linear atelectasis in the left lung base. Left basilar changes are improved since previous study. Electronically Signed   By: Burman Nieves M.D.   On: 06/17/2020 21:48   DG Knee Complete 4 Views Left  Result Date: 06/17/2020 CLINICAL DATA:  Status post fall. Pain to lateral side of hip and knee EXAM: LEFT KNEE - COMPLETE 4+ VIEW COMPARISON:  X-ray left knee 06/06/2020 FINDINGS: No evidence of acute fracture, dislocation, or joint effusion. Healing nondisplaced distal femoral metadiaphyseal fracture. Tricompartmental severe degenerative changes. Question mild chondrocalcinosis of the medial tibiofemoral compartment. No other aggressive focal bone abnormality. Soft tissues are unremarkable. Vascular calcifications. IMPRESSION: 1. No acute displaced fracture or dislocation patient with severe tricompartmental degenerative changes of the left knee. 2. Healing nondisplaced distal  femoral metadiaphyseal fracture. Electronically Signed   By: Tish Frederickson M.D.   On: 06/17/2020 23:47   DG Hip Unilat W or Wo Pelvis 2-3 Views Left  Result Date: 06/17/2020 CLINICAL DATA:  Fall, left lateral hip pain EXAM: DG HIP (WITH OR WITHOUT PELVIS) 2-3V LEFT COMPARISON:  06/06/2020 plain films and CT FINDINGS: No definite fracture seen as question on prior plain film and CT. There is slight cortical angulation noted laterally within the femoral neck, but is symmetric to the right side. No subluxation or dislocation. IMPRESSION: Mild cortical angulation laterally, question to be femoral neck fracture on prior study, but appears symmetric to the opposite side on today's study. No definite fracture seen. Electronically Signed   By: Charlett Nose M.D.   On: 06/17/2020 23:47    Procedures Procedures   Medications Ordered in ED Medications  morphine 4 MG/ML injection 4 mg (has no administration in time range)  ondansetron (ZOFRAN) injection 4 mg (has no administration in time range)  sodium chloride 0.9 % bolus 1,000 mL (1,000 mLs Intravenous New Bag/Given 06/17/20 2146)    ED Course  I have reviewed the triage vital signs and the nursing notes.  Pertinent labs & imaging results that were available during my care of the patient were reviewed by me and considered in my medical decision making (see chart for details).    MDM Rules/Calculators/A&P                          I spoke with pt's wife and daughter.  They think he gets agitated because he is not getting his pain medicine.  They request a consult to hospice.  They also say he fell again 2 days ago.  No new fx with fall.  No uti.  Pt has been cooperative here with [place].  I will give him some morphine prior to d/c, so hopefully, he can sleep.  Final Clinical Impression(s) / ED Diagnoses Final diagnoses:  Closed fracture of left hip with routine healing, subsequent encounter  Pain    Rx / DC Orders ED Discharge Orders  Ordered    Ambulatory referral to Hospice        06/17/20 2357           Jacalyn Lefevre, MD 06/17/20 2358

## 2020-06-18 DIAGNOSIS — S72401D Unspecified fracture of lower end of right femur, subsequent encounter for closed fracture with routine healing: Secondary | ICD-10-CM | POA: Diagnosis not present

## 2020-06-18 NOTE — ED Notes (Signed)
[facility] called for pt transport. 

## 2020-06-19 LAB — URINE CULTURE: Culture: NO GROWTH

## 2020-08-30 ENCOUNTER — Emergency Department (HOSPITAL_COMMUNITY): Payer: Medicare Other

## 2020-08-30 ENCOUNTER — Encounter (HOSPITAL_COMMUNITY): Payer: Self-pay | Admitting: *Deleted

## 2020-08-30 ENCOUNTER — Other Ambulatory Visit: Payer: Self-pay

## 2020-08-30 ENCOUNTER — Emergency Department (HOSPITAL_COMMUNITY)
Admission: EM | Admit: 2020-08-30 | Discharge: 2020-08-30 | Disposition: A | Discharge status: Home / Self Care | Payer: Medicare Other | Attending: Emergency Medicine | Admitting: Emergency Medicine

## 2020-08-30 DIAGNOSIS — Z7984 Long term (current) use of oral hypoglycemic drugs: Secondary | ICD-10-CM | POA: Diagnosis not present

## 2020-08-30 DIAGNOSIS — F0280 Dementia in other diseases classified elsewhere without behavioral disturbance: Secondary | ICD-10-CM | POA: Insufficient documentation

## 2020-08-30 DIAGNOSIS — Z79899 Other long term (current) drug therapy: Secondary | ICD-10-CM | POA: Diagnosis not present

## 2020-08-30 DIAGNOSIS — I10 Essential (primary) hypertension: Secondary | ICD-10-CM | POA: Insufficient documentation

## 2020-08-30 DIAGNOSIS — E119 Type 2 diabetes mellitus without complications: Secondary | ICD-10-CM | POA: Diagnosis not present

## 2020-08-30 DIAGNOSIS — Z7901 Long term (current) use of anticoagulants: Secondary | ICD-10-CM | POA: Insufficient documentation

## 2020-08-30 DIAGNOSIS — R5383 Other fatigue: Secondary | ICD-10-CM | POA: Insufficient documentation

## 2020-08-30 DIAGNOSIS — D72829 Elevated white blood cell count, unspecified: Secondary | ICD-10-CM | POA: Diagnosis not present

## 2020-08-30 DIAGNOSIS — R4182 Altered mental status, unspecified: Secondary | ICD-10-CM | POA: Diagnosis present

## 2020-08-30 DIAGNOSIS — I4891 Unspecified atrial fibrillation: Secondary | ICD-10-CM | POA: Insufficient documentation

## 2020-08-30 DIAGNOSIS — G309 Alzheimer's disease, unspecified: Secondary | ICD-10-CM | POA: Diagnosis not present

## 2020-08-30 LAB — URINALYSIS, ROUTINE W REFLEX MICROSCOPIC
Bilirubin Urine: NEGATIVE
Glucose, UA: 50 mg/dL — AB
Hgb urine dipstick: NEGATIVE
Ketones, ur: 20 mg/dL — AB
Leukocytes,Ua: NEGATIVE
Nitrite: NEGATIVE
Protein, ur: NEGATIVE mg/dL
Specific Gravity, Urine: 1.023 (ref 1.005–1.030)
pH: 5 (ref 5.0–8.0)

## 2020-08-30 LAB — BASIC METABOLIC PANEL
Anion gap: 8 (ref 5–15)
BUN: 25 mg/dL — ABNORMAL HIGH (ref 8–23)
CO2: 28 mmol/L (ref 22–32)
Calcium: 9.1 mg/dL (ref 8.9–10.3)
Chloride: 101 mmol/L (ref 98–111)
Creatinine, Ser: 0.98 mg/dL (ref 0.61–1.24)
GFR, Estimated: >60 mL/min (ref >60)
Glucose, Bld: 163 mg/dL — ABNORMAL HIGH (ref 70–99)
Potassium: 4.5 mmol/L (ref 3.5–5.1)
Sodium: 137 mmol/L (ref 135–145)

## 2020-08-30 LAB — CBC
HCT: 39.7 % (ref 39.0–52.0)
Hemoglobin: 12.2 g/dL — ABNORMAL LOW (ref 13.0–17.0)
MCH: 28.8 pg (ref 26.0–34.0)
MCHC: 30.7 g/dL (ref 30.0–36.0)
MCV: 93.9 fL (ref 80.0–100.0)
Platelets: 277 10*3/uL (ref 150–400)
RBC: 4.23 MIL/uL (ref 4.22–5.81)
RDW: 14.9 % (ref 11.5–15.5)
WBC: 12 10*3/uL — ABNORMAL HIGH (ref 4.0–10.5)
nRBC: 0 % (ref 0.0–0.2)

## 2020-08-30 LAB — VALPROIC ACID LEVEL: Valproic Acid Lvl: <10 ug/mL — ABNORMAL LOW (ref 50.0–100.0)

## 2020-08-30 IMAGING — CT CT HEAD W/O CM
4 series · 16 of 47 positions shown, 18 images · non-contrast
Comparison: [DATE]

CLINICAL DATA: Altered level of consciousness

EXAM:
CT HEAD WITHOUT CONTRAST
TECHNIQUE: Contiguous axial images were obtained from the base of the skull
through the vertex without intravenous contrast.

[Series 3: head bone · axial · 0.44mm/px · z∈[-141,-109]mm · 3 of 82 slices shown]
[im 9/82  bone]
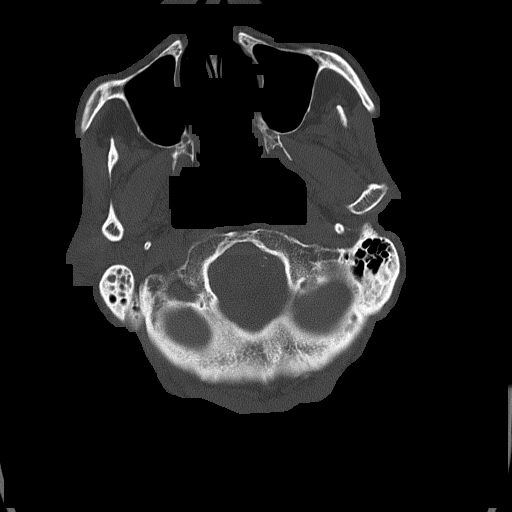
[im 17/82  bone]
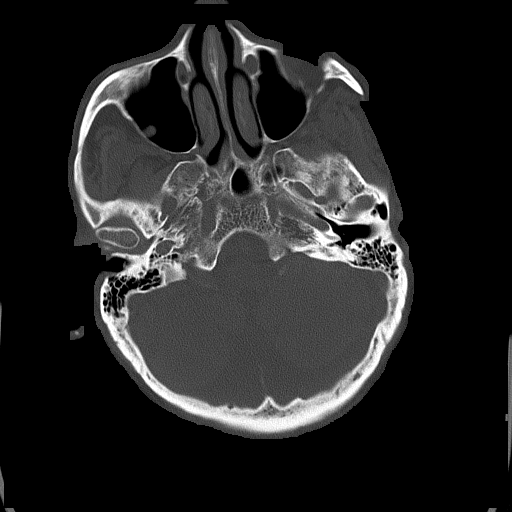
[im 25/82  bone]
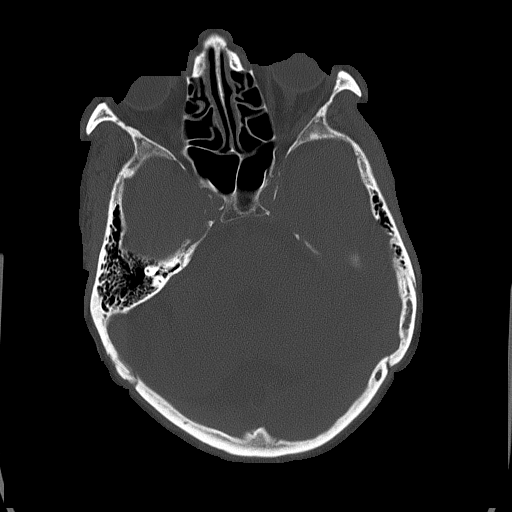

[Series 4: head without · axial · non-contrast · 0.44mm/px · z∈[-137,-17]mm · 7 of 33 slices shown, 9 images]
[im 5/33  brain]
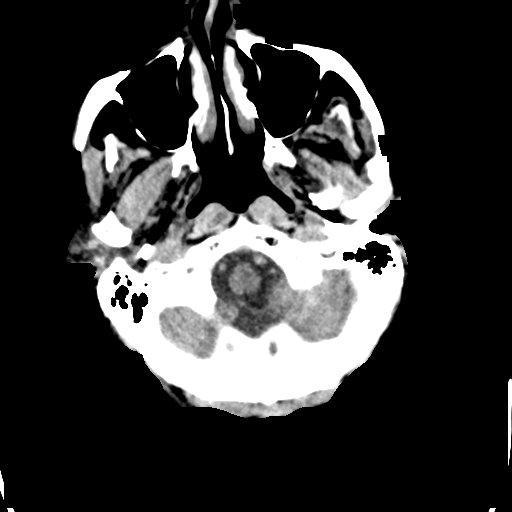
[im 5/33  bone]
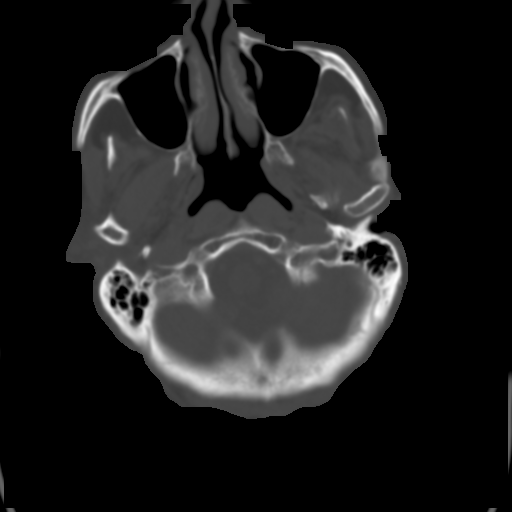
[im 9/33  brain]
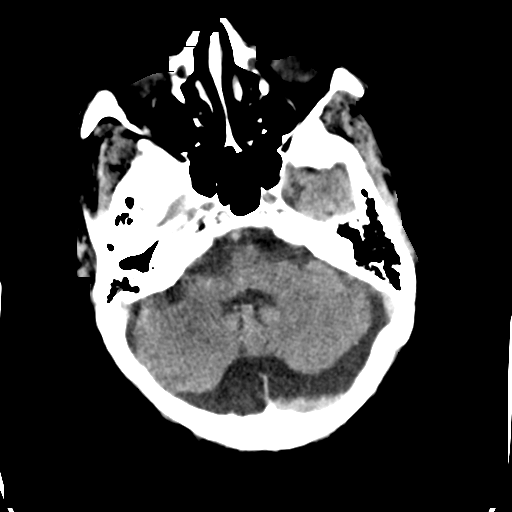
[im 13/33  brain]
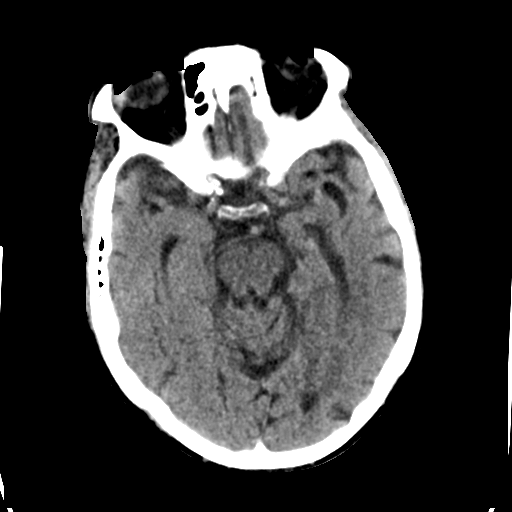
[im 17/33  brain]
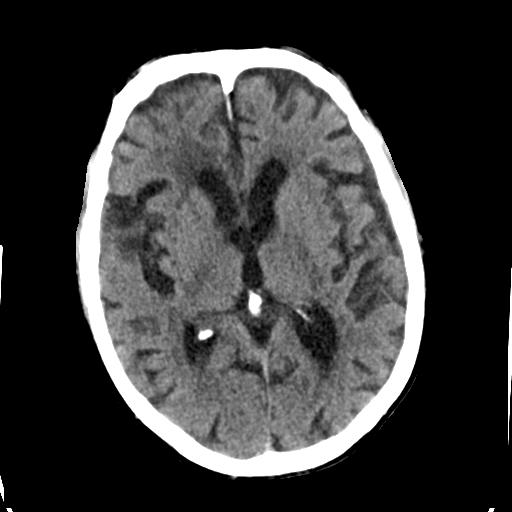
[im 21/33  brain]
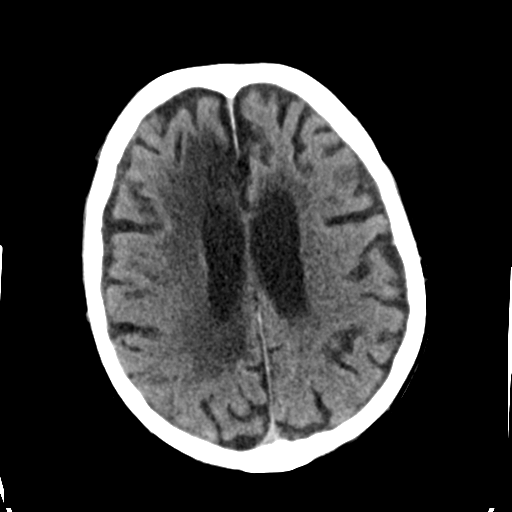
[im 21/33  bone]
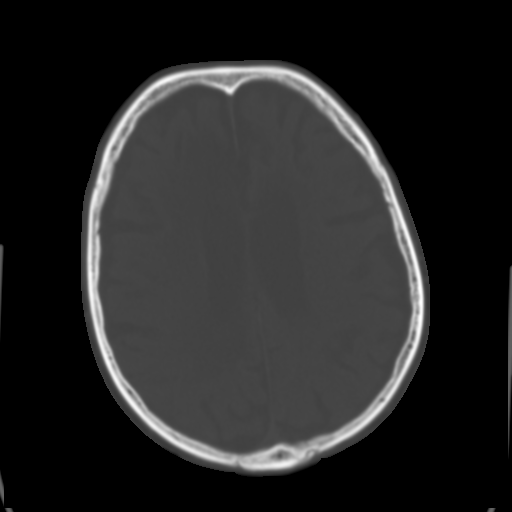
[im 25/33  brain]
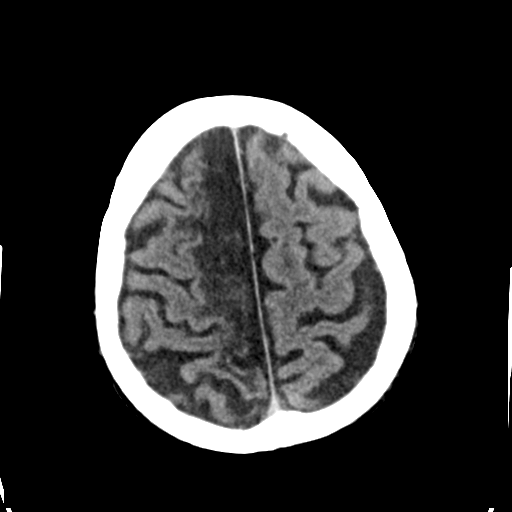
[im 29/33  brain]
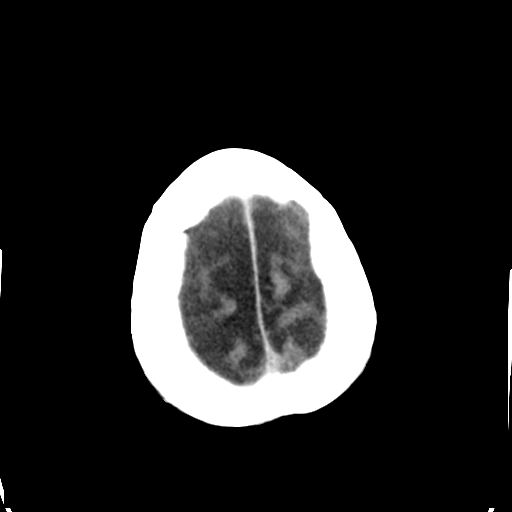

[Series 5: head without cor · coronal · non-contrast · 0.33mm/px · 3 of 68 slices shown]
[im 23/68  brain]
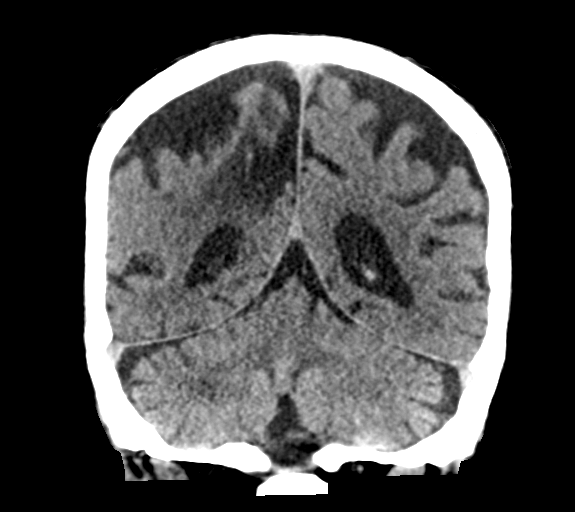
[im 30/68  brain]
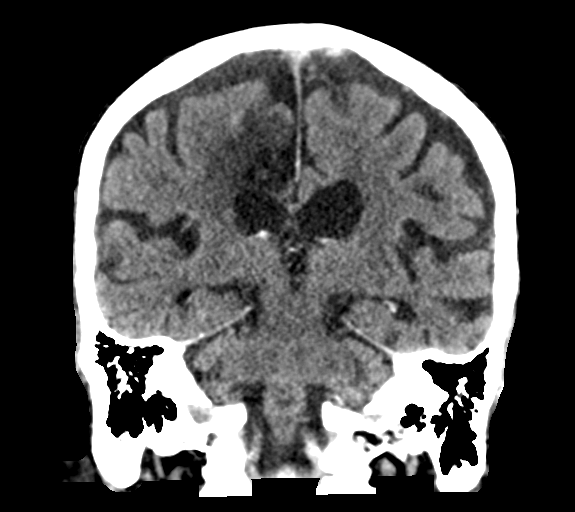
[im 38/68  brain]
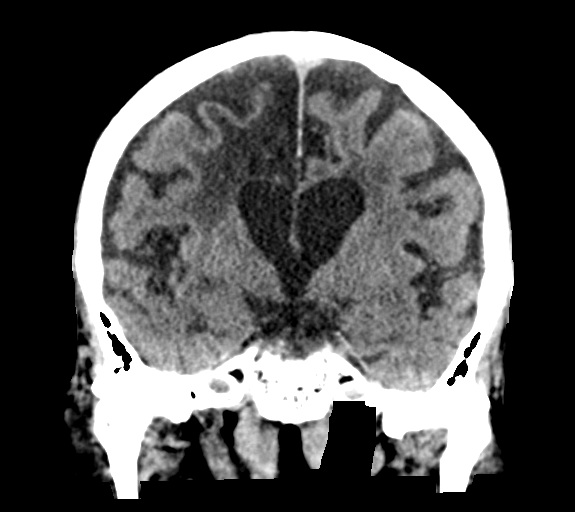

[Series 6: head without sag · sagittal · non-contrast · 0.35mm/px · 3 of 54 slices shown]
[im 18/54  brain]
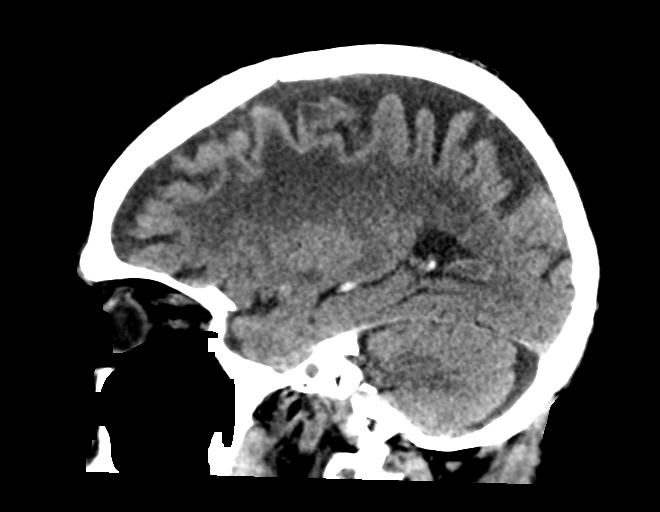
[im 27/54  brain]
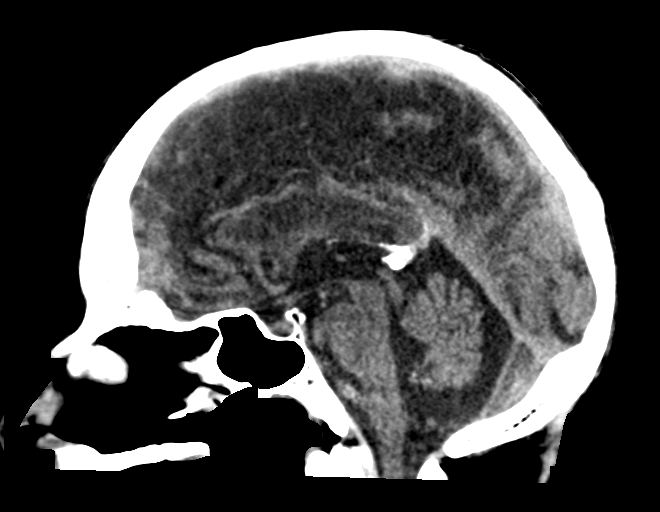
[im 36/54  brain]
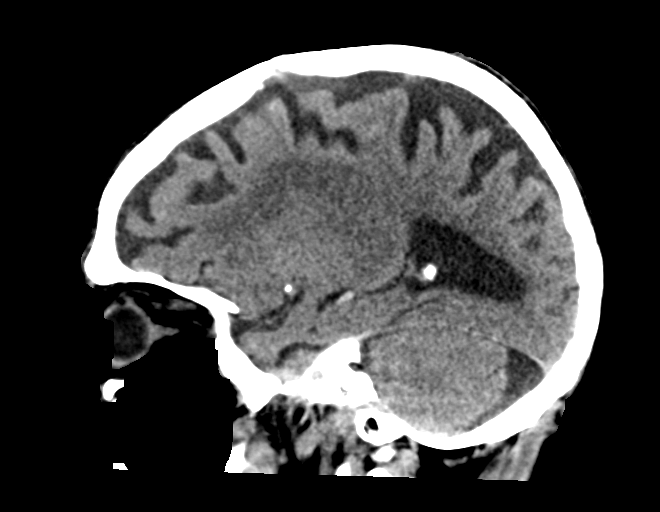

[16 of 47 positions shown; findings below may reference images not displayed]

FINDINGS: Brain: Stable encephalomalacia from right anterior cerebral artery
territory infarct. Chronic small vessel ischemic changes are again
seen throughout the periventricular white matter. No signs of acute
infarct or hemorrhage. The lateral ventricles and remaining midline
structures are stable. No acute extra-axial fluid collections. No
mass effect.

Vascular: Stable atherosclerosis.  No hyperdense vessel.

Skull: Normal. Negative for fracture or focal lesion.

Sinuses/Orbits: No acute finding.

Other: None.
IMPRESSION: 1. Chronic ischemic changes as above. No acute intracranial process.

## 2020-08-30 MED ORDER — SODIUM CHLORIDE 0.9 % IV BOLUS
1000.0000 mL | Freq: Once | INTRAVENOUS | Status: AC
  Administered 2020-08-30: 1000 mL via INTRAVENOUS

## 2020-08-30 NOTE — ED Notes (Signed)
Wife Jeffery Haynes [phone] would like an update soon

## 2020-08-30 NOTE — ED Notes (Signed)
Wife [name] called and update was given would like to be called with diaposition [phone]

## 2020-08-30 NOTE — ED Notes (Signed)
[facility] called to transport patient back to [facility].  

## 2020-08-30 NOTE — ED Provider Notes (Signed)
[facility] EMERGENCY DEPARTMENT Provider Note  CSN: [id number] Arrival date & time: 08/30/20 0021  Chief Complaint(s) Altered Mental Status ED Triage Notes Terrace Arabia, RN (Registered Nurse)   Date of Service: 08/30/2020 12:30 AM   Signed   Patient presents to ed via [facility] states staff state patient was altered this pm staff couldn't say when she was last at his baseline. Per staff patient is combative at times and is given ativan several times a day, staff states they saw patient shaking question seizure. Patient is currently alert to verbal stimuli non verbal     HPI Jeffery Haynes is a 87 y.o. male   The history is provided by the EMS personnel.  Altered Mental Status Wife reports that at baseline he is able to speak though he is demented at baseline.  She reports that similar episodes have occurred in the past where he was overmedicated.  Remainder of history, ROS, and physical exam limited due to patient's condition (dementia). Additional information was obtained from EMS and family.   Level V Caveat.  Past Medical History Past Medical History:  Diagnosis Date   Alzheimer disease (HCC)    Dementia (HCC)    Diabetes mellitus without complication [facility])    Patient Active Problem List   Diagnosis Date Noted   Alzheimer's dementia (HCC) 06/07/2020   Fracture of femoral neck, left, closed (HCC) 06/07/2020   Closed fracture of left distal femur (HCC) 06/07/2020   Essential hypertension 06/07/2020   Diabetes mellitus type 2 in nonobese (HCC) 06/07/2020   Fracture of femoral neck, left (HCC) 06/07/2020   Acute metabolic encephalopathy 01/29/2020   Atrial fibrillation with RVR (HCC) 01/29/2020   Alzheimer disease (HCC)    Dementia (HCC)    Home Medication(s) Prior to Admission medications   Medication Sig Start Date End Date Taking? Authorizing Provider  acetaminophen (TYLENOL) 325 MG tablet Take 650 mg by mouth 4 (four) times daily.   Yes [provider]  amLODipine (NORVASC) 5 MG tablet Take 5 mg by mouth in the morning.   Yes [provider]  apixaban (ELIQUIS) 5 MG TABS tablet Take 1 tablet (5 mg total) by mouth 2 (two) times daily. 02/01/20 01/31/21 Yes Pokhrel, Laxman, MD  benzonatate (TESSALON) 200 MG capsule Take 200 mg by mouth every 8 (eight) hours as needed for cough.   Yes [provider]  citalopram (CELEXA) 10 MG tablet Take 15 mg by mouth in the morning.   Yes [provider]  diclofenac Sodium (VOLTAREN) 1 % GEL Apply 2 g topically See admin instructions. Apply 2 grams to both shoulders two times a day   Yes [provider]  divalproex (DEPAKOTE) 125 MG DR tablet Take 250 mg by mouth in the morning and at bedtime.   Yes [provider]  donepezil (ARICEPT) 5 MG tablet Take 1 tablet (5 mg total) by mouth at bedtime. 02/01/20 01/31/21 Yes Pokhrel, Laxman, MD  feeding supplement, GLUCERNA SHAKE, (GLUCERNA SHAKE) LIQD Take 237 mLs by mouth in the morning and at bedtime.   Yes [provider]  guaifenesin (ROBITUSSIN) 100 MG/5ML syrup Take 200 mg by mouth every 4 (four) hours as needed for cough.   Yes [provider]  lidocaine (LIDODERM) 5 % Place 1 patch onto the skin daily. Remove & Discard patch within 12 hours or as directed by MD Patient taking differently: Place 1 patch onto the skin See admin instructions. Apply 1 path to left knee and  1 patch to lower back. 12 hours on, 12 hours off 06/10/20  Yes Alwyn Ren, MD  metFORMIN (GLUCOPHAGE) 500 MG tablet Take 500 mg by mouth 2 (two) times daily.   Yes [provider]  Multiple Vitamins-Minerals (MULTIVITAMIN WITH MINERALS) tablet Take 1 tablet by mouth daily.   Yes [provider]  NONFORMULARY OR COMPOUNDED ITEM Apply 1 application topically See admin instructions. ABH compounded gel: Lorazepam 0.5 mg/Diphenhydramine HCl 25 mg/Haloperidol 0.5 mg/ml and Lipoderm gel- Apply to skin  on  arms every 8 hours AND an additional application every 8 hours as needed for restlessness or agitation   Yes [provider]  OLANZapine (ZYPREXA) 2.5 MG tablet Take 2.5 mg by mouth at bedtime. 05/25/20  Yes [provider]  polyethylene glycol powder (GLYCOLAX/MIRALAX) 17 GM/SCOOP powder Take 17 g by mouth daily.   Yes [provider]  potassium chloride (KLOR-CON) 10 MEQ tablet Take 1 tablet (10 mEq total) by mouth daily. 02/01/20  Yes Pokhrel, Laxman, MD  sennosides-docusate sodium (SENOKOT-S) 8.6-50 MG tablet Take 2 tablets by mouth 2 (two) times daily.   Yes [provider]  tamsulosin (FLOMAX) 0.4 MG CAPS capsule Take 0.4 mg by mouth at bedtime.   Yes [provider]  traMADol (ULTRAM) 50 MG tablet Take 50 mg by mouth every 6 (six) hours as needed for moderate pain (related to left leg fracture).   Yes [provider]                                                                                                                                    Past Surgical History History reviewed. No pertinent surgical history. Family History Family History  Problem Relation Age of Onset   Seizures Neg Hx     Social History Social History   Tobacco Use   Smoking status: Never   Smokeless tobacco: Never  Vaping Use   Vaping Use: Never used  Substance Use Topics   Alcohol use: Not Currently   Drug use: Never   Allergies Olanzapine  Review of Systems Review of Systems Unable to obtain due to dementia Physical Exam Vital Signs  I have reviewed the triage vital signs BP (!) 142/69   Pulse 85   Temp 97.8 F (36.6 C) (Oral)   Resp 13   SpO2 97%   Physical Exam Vitals reviewed.  Constitutional:      General: He is not in acute distress.    Appearance: He is well-developed. He is not diaphoretic.  HENT:     Head: Normocephalic and atraumatic.     Nose: Nose normal.  Eyes:     General: No scleral icterus.       Right eye: No  discharge.        Left eye: No discharge.     Conjunctiva/sclera: Conjunctivae normal.     Pupils: Pupils are equal, round, and reactive to light.  Cardiovascular:     Rate and Rhythm: Normal rate and regular rhythm.     Heart sounds: No murmur heard.   No friction rub. No gallop.  Pulmonary:     Effort: Pulmonary effort is normal. No respiratory distress.     Breath sounds: Normal breath sounds. No stridor. No rales.  Abdominal:     General: There is no distension.     Palpations: Abdomen is soft.     Tenderness: There is no abdominal tenderness.  Musculoskeletal:        General: No tenderness.     Cervical back: Normal range of motion and neck supple.  Skin:    General: Skin is warm and dry.     Findings: No erythema or rash.  Neurological:     Mental Status: He is lethargic.     Comments: Nonverbal. Moves all extremities.    ED Results and Treatments Labs (all labs ordered are listed, but only abnormal results are displayed) Labs Reviewed  CBC - Abnormal; Notable for the following components:      Result Value   WBC 12.0 (*)    Hemoglobin 12.2 (*)    All other components within normal limits  BASIC METABOLIC PANEL - Abnormal; Notable for the following components:   Glucose, Bld 163 (*)    BUN 25 (*)    All other components within normal limits  URINALYSIS, ROUTINE W REFLEX MICROSCOPIC - Abnormal; Notable for the following components:   APPearance HAZY (*)    Glucose, UA 50 (*)    Ketones, ur 20 (*)    All other components within normal limits  VALPROIC ACID LEVEL - Abnormal; Notable for the following components:   Valproic Acid Lvl <10 (*)    All other components within normal limits                                                                                                                         EKG  EKG Interpretation  Date/Time:  Saturday August 30 2020 00:28:10 EDT Ventricular Rate:  100 PR Interval:  166 QRS Duration: 88 QT Interval:  316 QTC  Calculation: 408 R Axis:   56 Text Interpretation: Sinus tachycardia Anterior infarct, old Borderline repolarization abnormality No significant change since last tracing Confirmed by Drema Pry [phone]) on 08/30/2020 12:41:02 AM        Radiology CT Head Wo Contrast  Result Date: 08/30/2020 CLINICAL DATA:  Altered level of consciousness EXAM: CT HEAD WITHOUT CONTRAST TECHNIQUE: Contiguous axial images were obtained from the base of the skull through the vertex without intravenous contrast. 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 Chronic small vessel ischemic changes are again seen throughout the periventricular white matter. No signs of acute infarct or hemorrhage. The lateral ventricles and remaining midline structures are stable. No acute extra-axial fluid collections. No mass effect. Vascular: Stable atherosclerosis.  No hyperdense vessel. Skull: Normal. Negative for fracture or focal  lesion. Sinuses/Orbits: No acute finding. Other: None. IMPRESSION: 1. Chronic ischemic changes as above. No acute intracranial process. Electronically Signed   By: Sharlet SalinaMichael  Brown M.D.   On: 08/30/2020 02:54    Pertinent labs & imaging results that were available during my care of the patient were reviewed by me and considered in my medical decision making (see chart for details).  Medications Ordered in ED Medications  sodium chloride 0.9 % bolus 1,000 mL (1,000 mLs Intravenous New Bag/Given 08/30/20 0252)                                                                                                                                    Procedures Procedures  (including critical care time)  Medical Decision Making / ED Course I have reviewed the nursing notes for this encounter and the patient's prior records (if available in EHR or on provided paperwork).   Jeffery Haynes was evaluated in Emergency Department on 08/30/2020 for  the symptoms described in the history of present illness. He was evaluated in the context of the global COVID-19 pandemic, which necessitated consideration that the patient might be at risk for infection with the SARS-CoV-2 virus that causes COVID-19. Institutional protocols and algorithms that pertain to the evaluation of patients at risk for COVID-19 are in a state of rapid change based on information released by regulatory bodies including the CDC and federal and state organizations. These policies and algorithms were followed during the patient's care in the ED.  Patient was brought in for increased lethargy/altered mental status. Apparently patient requires recurrent doses of Ativan for agitation at the skilled nursing facility. No evidence of trauma noted on my exam. Initial concern likely for oversedation due to medication but will rule out other organic causes. Given the patient is on blood thinner, CT head obtained and negative for ICH/SAH. CBC with mild leukocytosis but no anemia.  No significant electrolyte derangements or renal sufficiency.  UA without evidence of infection. Patient allowed to rest and metabolize and slowly returned to baseline. Patient now more easily arousable to verbal stimuli.  Able to converse.  He still disoriented but that appears to be his baseline.  He is able to tell me his wife name is Corrie DandyMary.  I called and spoke to the patient's wife and updated her on the situation.  Recommended medication reconciliation at his skilled nursing facility to assess for possible oversedation.      Final Clinical Impression(s) / ED Diagnoses Final diagnoses:  Lethargy    The patient appears reasonably screened and/or stabilized for discharge and I doubt any other medical condition or other [facility] requiring further screening, evaluation, or treatment in the ED at this time prior to discharge. Safe for discharge with strict return precautions.  Disposition: Discharge  Condition:  Good  I have discussed the results, Dx and Tx plan with the patient/family who expressed understanding and agree(s) with the plan. Discharge instructions  discussed at length. The patient/family was given strict return precautions who verbalized understanding of the instructions. No further questions at time of discharge.    ED Discharge Orders     None       Follow Up: Primary care provider  Schedule an appointment as soon as possible for a visit       This chart was dictated using voice recognition software.  Despite best efforts to proofread,  errors can occur which can change the documentation meaning.    Nira Conn, MD 08/30/20 0600

## 2020-08-30 NOTE — ED Notes (Signed)
[facility] called  To transport to [facility].

## 2020-08-30 NOTE — ED Triage Notes (Signed)
Patient presents to ed via [facility] states staff state patient was altered this pm staff couldn't say when she was last at his baseline. Per staff patient is combative at times and is given ativan several times a day, staff states they saw patient shaking question seizure. Patient is currently alert to verbal stimuli non verbal

## 2020-08-30 NOTE — Discharge Instructions (Addendum)
Patient's work-up here was reassuring.  He does not appear to have an infectious process causing his altered mental status.  His CT head was negative for any bleeds.  He gradually improved throughout the night.  Given his repeated need for sedated medicine, we recommend medication reconciliation at his skilled nursing facility to assess for possible oversedation.

## 2020-08-30 NOTE — ED Notes (Signed)
Lab called to add valporic acid will do.

## 2020-09-23 IMAGING — CR DG KNEE COMPLETE 4+V*L*
4 series · 4 of 4 positions shown · non-contrast
Comparison: None.

CLINICAL DATA: Recent fall with left knee pain, initial encounter

EXAM:
LEFT KNEE - COMPLETE 4+ VIEW

[x knee obl left (1 of 2)]
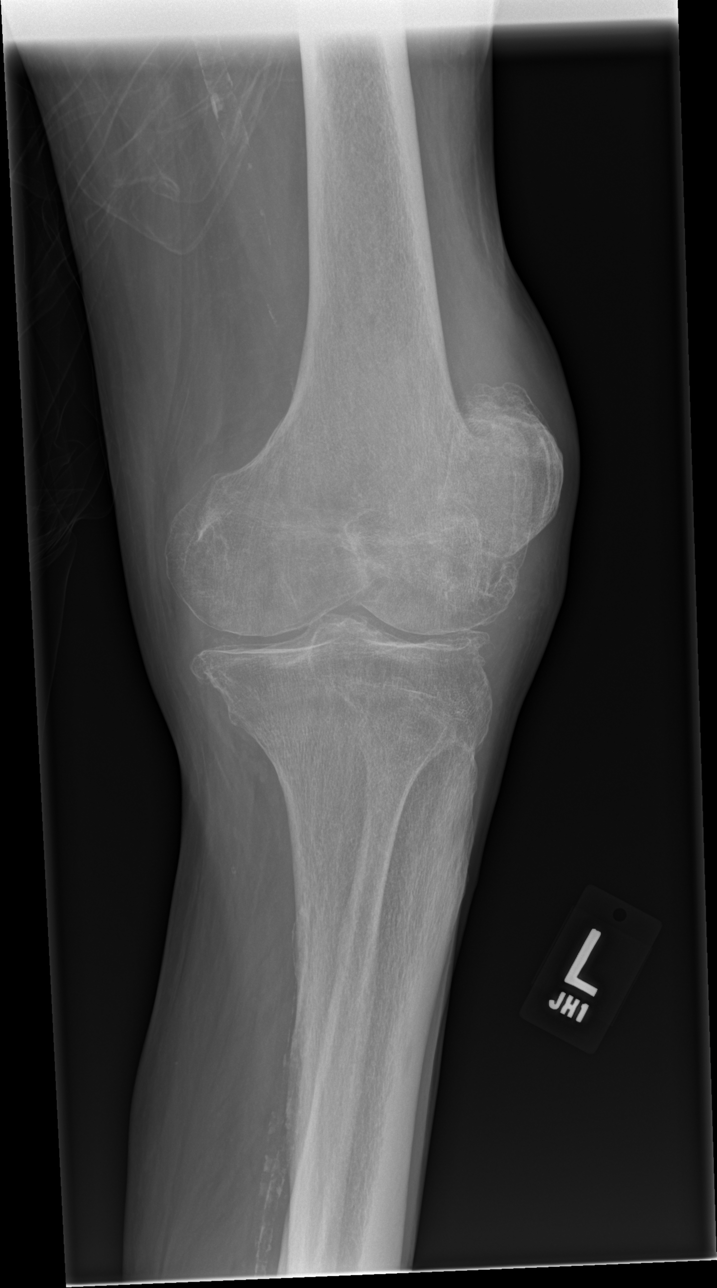

[x knee obl left (2 of 2)]
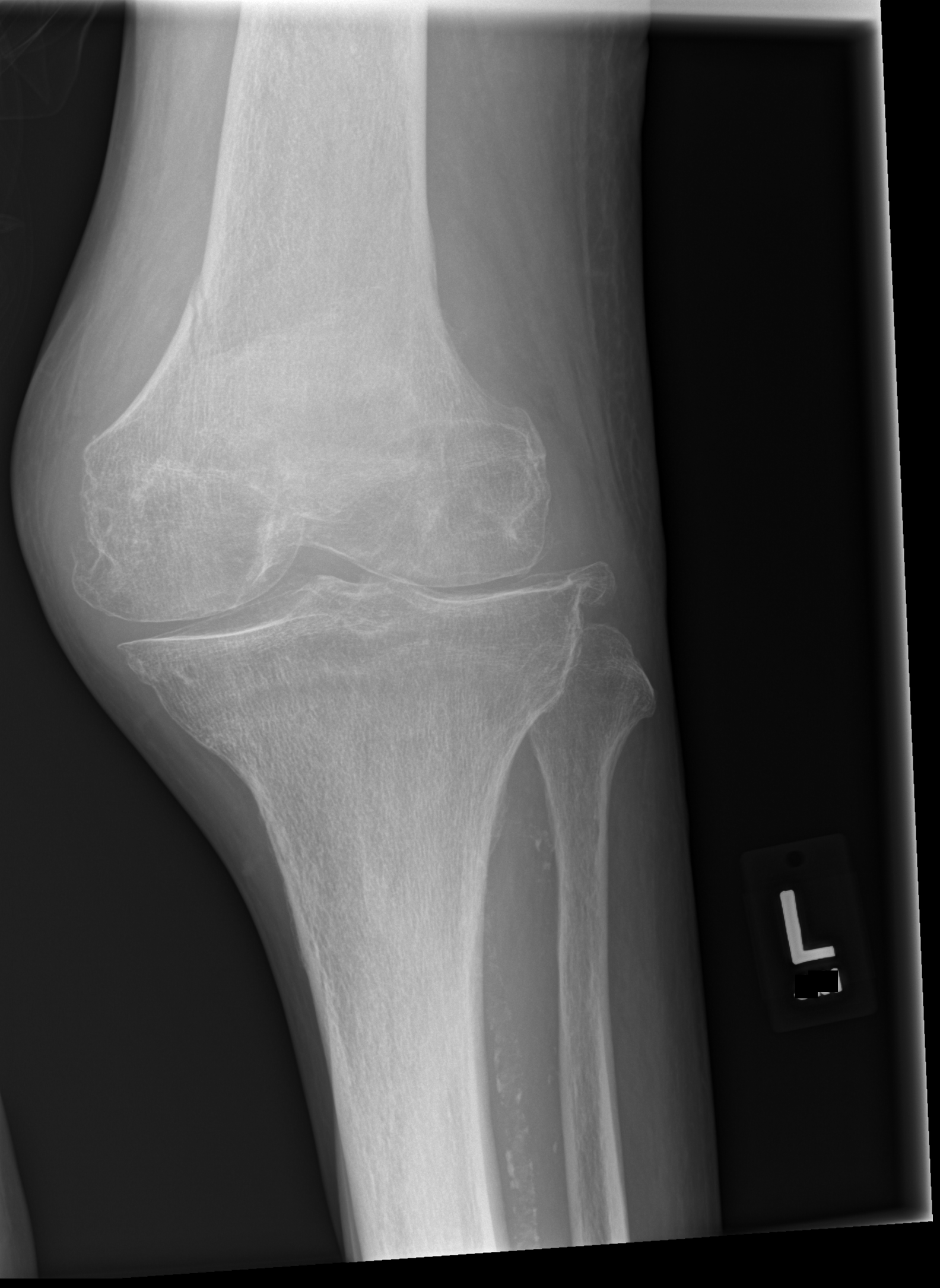

[x knee ap left]
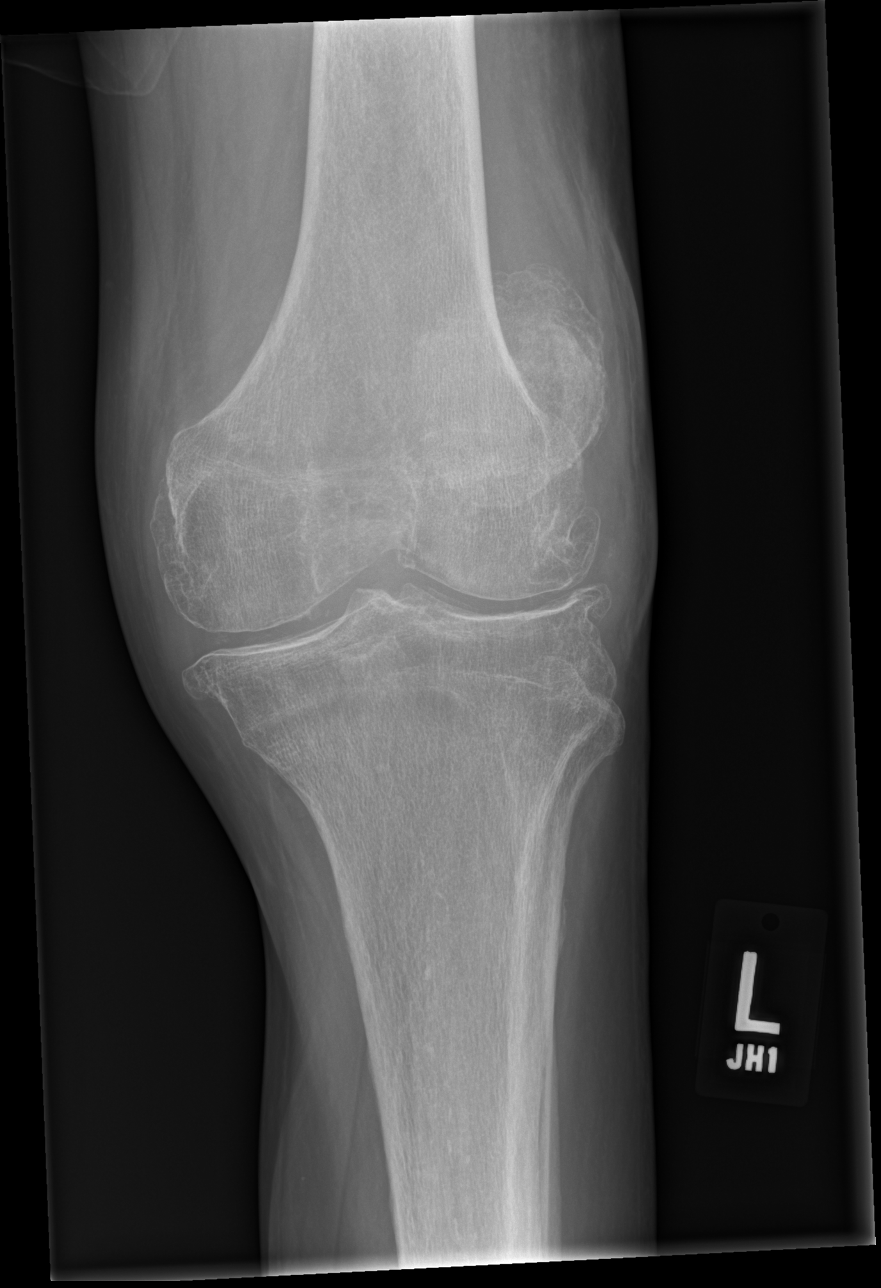

[x knee lat left]
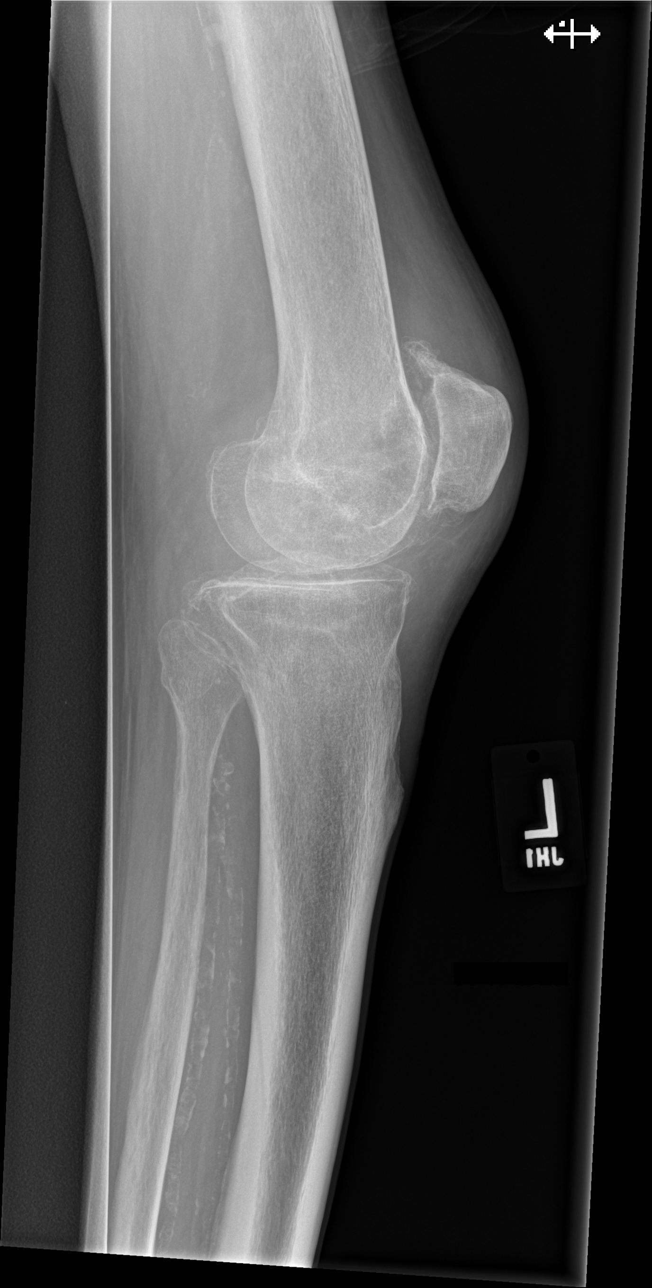

[4 of 4 positions shown; findings below may reference images not displayed]

FINDINGS: There is an undisplaced distal femoral fracture involving the
metaphysis without significant extension into the joint space.
Degenerative changes of the knee joint are seen. Small joint
effusion is noted.
IMPRESSION: Undisplaced distal femoral metaphyseal fracture.

## 2020-12-12 ENCOUNTER — Encounter (HOSPITAL_COMMUNITY): Payer: Self-pay | Admitting: *Deleted

## 2020-12-12 ENCOUNTER — Emergency Department (HOSPITAL_COMMUNITY): Payer: Medicare Other

## 2020-12-12 ENCOUNTER — Inpatient Hospital Stay (HOSPITAL_COMMUNITY)
Admission: EM | Admit: 2020-12-12 | Discharge: 2020-12-29 | DRG: 100 | Disposition: A | Discharge status: Skilled Nursing Facility | Payer: Medicare Other | Source: Skilled Nursing Facility | Attending: Family Medicine | Admitting: Family Medicine

## 2020-12-12 ENCOUNTER — Other Ambulatory Visit: Payer: Self-pay

## 2020-12-12 DIAGNOSIS — Z9181 History of falling: Secondary | ICD-10-CM

## 2020-12-12 DIAGNOSIS — Z8673 Personal history of transient ischemic attack (TIA), and cerebral infarction without residual deficits: Secondary | ICD-10-CM

## 2020-12-12 DIAGNOSIS — E119 Type 2 diabetes mellitus without complications: Secondary | ICD-10-CM | POA: Diagnosis present

## 2020-12-12 DIAGNOSIS — R29810 Facial weakness: Secondary | ICD-10-CM | POA: Diagnosis present

## 2020-12-12 DIAGNOSIS — I6601 Occlusion and stenosis of right middle cerebral artery: Secondary | ICD-10-CM | POA: Diagnosis present

## 2020-12-12 DIAGNOSIS — I6502 Occlusion and stenosis of left vertebral artery: Secondary | ICD-10-CM | POA: Diagnosis present

## 2020-12-12 DIAGNOSIS — R569 Unspecified convulsions: Secondary | ICD-10-CM | POA: Diagnosis not present

## 2020-12-12 DIAGNOSIS — G40901 Epilepsy, unspecified, not intractable, with status epilepticus: Secondary | ICD-10-CM

## 2020-12-12 DIAGNOSIS — U071 COVID-19: Secondary | ICD-10-CM

## 2020-12-12 DIAGNOSIS — F0280 Dementia in other diseases classified elsewhere without behavioral disturbance: Secondary | ICD-10-CM | POA: Diagnosis present

## 2020-12-12 DIAGNOSIS — Z7901 Long term (current) use of anticoagulants: Secondary | ICD-10-CM

## 2020-12-12 DIAGNOSIS — Z66 Do not resuscitate: Secondary | ICD-10-CM | POA: Diagnosis present

## 2020-12-12 DIAGNOSIS — Z79899 Other long term (current) drug therapy: Secondary | ICD-10-CM

## 2020-12-12 DIAGNOSIS — N400 Enlarged prostate without lower urinary tract symptoms: Secondary | ICD-10-CM | POA: Diagnosis present

## 2020-12-12 DIAGNOSIS — I480 Paroxysmal atrial fibrillation: Secondary | ICD-10-CM | POA: Diagnosis present

## 2020-12-12 DIAGNOSIS — F02818 Dementia in other diseases classified elsewhere, unspecified severity, with other behavioral disturbance: Secondary | ICD-10-CM | POA: Diagnosis present

## 2020-12-12 DIAGNOSIS — G8194 Hemiplegia, unspecified affecting left nondominant side: Secondary | ICD-10-CM | POA: Diagnosis present

## 2020-12-12 DIAGNOSIS — G309 Alzheimer's disease, unspecified: Secondary | ICD-10-CM | POA: Diagnosis present

## 2020-12-12 DIAGNOSIS — I10 Essential (primary) hypertension: Secondary | ICD-10-CM | POA: Diagnosis present

## 2020-12-12 DIAGNOSIS — Z7984 Long term (current) use of oral hypoglycemic drugs: Secondary | ICD-10-CM

## 2020-12-12 DIAGNOSIS — F0630 Mood disorder due to known physiological condition, unspecified: Secondary | ICD-10-CM | POA: Diagnosis present

## 2020-12-12 HISTORY — DX: Cerebral infarction, unspecified: I63.9

## 2020-12-12 LAB — DIFFERENTIAL
Abs Immature Granulocytes: 0.05 10*3/uL (ref 0.00–0.07)
Basophils Absolute: 0 10*3/uL (ref 0.0–0.1)
Basophils Relative: 1 %
Eosinophils Absolute: 0.1 10*3/uL (ref 0.0–0.5)
Eosinophils Relative: 1 %
Immature Granulocytes: 1 %
Lymphocytes Relative: 20 %
Lymphs Abs: 1.5 10*3/uL (ref 0.7–4.0)
Monocytes Absolute: 0.6 10*3/uL (ref 0.1–1.0)
Monocytes Relative: 7 %
Neutro Abs: 5.4 10*3/uL (ref 1.7–7.7)
Neutrophils Relative %: 70 %

## 2020-12-12 LAB — CBC
HCT: 35.6 % — ABNORMAL LOW (ref 39.0–52.0)
Hemoglobin: 11.3 g/dL — ABNORMAL LOW (ref 13.0–17.0)
MCH: 29.6 pg (ref 26.0–34.0)
MCHC: 31.7 g/dL (ref 30.0–36.0)
MCV: 93.2 fL (ref 80.0–100.0)
Platelets: 263 10*3/uL (ref 150–400)
RBC: 3.82 MIL/uL — ABNORMAL LOW (ref 4.22–5.81)
RDW: 14 % (ref 11.5–15.5)
WBC: 7.6 10*3/uL (ref 4.0–10.5)
nRBC: 0 % (ref 0.0–0.2)

## 2020-12-12 LAB — COMPREHENSIVE METABOLIC PANEL
ALT: 11 U/L (ref 0–44)
AST: 11 U/L — ABNORMAL LOW (ref 15–41)
Albumin: 3.4 g/dL — ABNORMAL LOW (ref 3.5–5.0)
Alkaline Phosphatase: 78 U/L (ref 38–126)
Anion gap: 8 (ref 5–15)
BUN: 25 mg/dL — ABNORMAL HIGH (ref 8–23)
CO2: 25 mmol/L (ref 22–32)
Calcium: 9.2 mg/dL (ref 8.9–10.3)
Chloride: 101 mmol/L (ref 98–111)
Creatinine, Ser: 0.93 mg/dL (ref 0.61–1.24)
GFR, Estimated: >60 mL/min (ref >60)
Glucose, Bld: 156 mg/dL — ABNORMAL HIGH (ref 70–99)
Potassium: 4.6 mmol/L (ref 3.5–5.1)
Sodium: 134 mmol/L — ABNORMAL LOW (ref 135–145)
Total Bilirubin: 0.4 mg/dL (ref 0.3–1.2)
Total Protein: 6.1 g/dL — ABNORMAL LOW (ref 6.5–8.1)

## 2020-12-12 LAB — I-STAT CHEM 8, ED
BUN: 30 mg/dL — ABNORMAL HIGH (ref 8–23)
Calcium, Ion: 1.09 mmol/L — ABNORMAL LOW (ref 1.15–1.40)
Chloride: 102 mmol/L (ref 98–111)
Creatinine, Ser: 0.9 mg/dL (ref 0.61–1.24)
Glucose, Bld: 168 mg/dL — ABNORMAL HIGH (ref 70–99)
HCT: 41 % (ref 39.0–52.0)
Hemoglobin: 13.9 g/dL (ref 13.0–17.0)
Potassium: 4.9 mmol/L (ref 3.5–5.1)
Sodium: 135 mmol/L (ref 135–145)
TCO2: 28 mmol/L (ref 22–32)

## 2020-12-12 LAB — PROTIME-INR
INR: 1 (ref 0.8–1.2)
Prothrombin Time: 13.1 seconds (ref 11.4–15.2)

## 2020-12-12 LAB — CBG MONITORING, ED: Glucose-Capillary: 151 mg/dL — ABNORMAL HIGH (ref 70–99)

## 2020-12-12 LAB — APTT: aPTT: 36 seconds (ref 24–36)

## 2020-12-12 IMAGING — CT CT ANGIO HEAD-NECK (W OR W/O PERF)
3 of 7 series · 10 of 36 positions shown · IV contrast (APPLIED)
Comparison: None.

CLINICAL DATA: Neuro deficit, acute, stroke suspected; Stroke/TIA,
assess extracranial arteries; Stroke/TIA, assess intracranial
arteries

EXAM:
CT HEAD WITHOUT CONTRAST
CT ANGIOGRAPHY OF THE HEAD AND NECK
TECHNIQUE: Contiguous axial images were obtained from the base of the skull
through the vertex without intravenous contrast. Multidetector CT
imaging of the head and neck was performed using the standard
protocol during bolus administration of intravenous contrast.
Multiplanar CT image reconstructions and MIPs were obtained to
evaluate the vascular anatomy. Carotid stenosis measurements (when
applicable) are obtained utilizing NASCET criteria, using the distal
internal carotid diameter as the denominator.
CONTRAST:  75mL OMNIPAQUE IOHEXOL 350 MG/ML SOLN

[Series 7: cta neck/head · axial · 0.46mm/px · z∈[-234,-112]mm · 2 of 183 slices shown]
[im 61/183  soft-tissue]
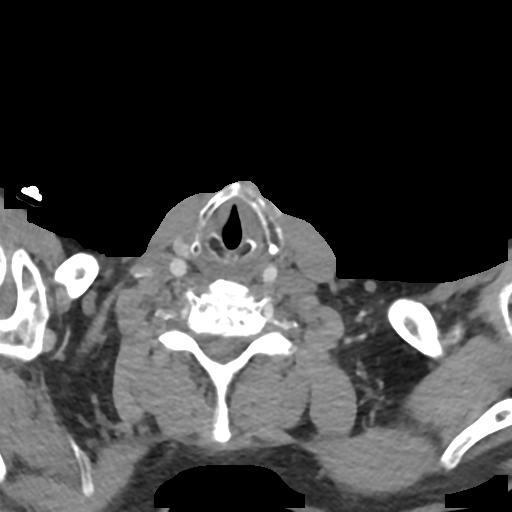
[im 122/183  soft-tissue]
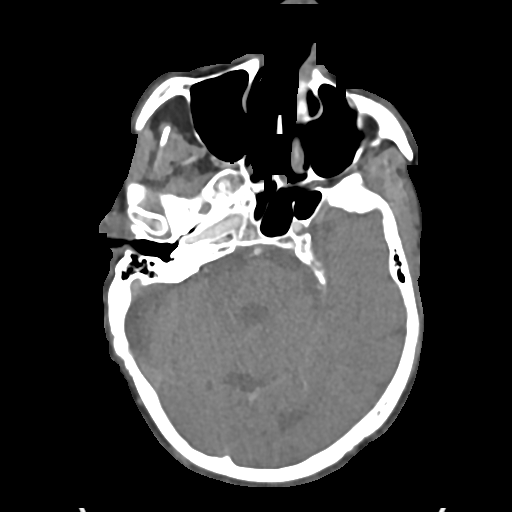

[Series 9: ax thins · axial · 0.39mm/px · z∈[-296,-38]mm · 6 of 364 slices shown]
[im 52/364  soft-tissue]
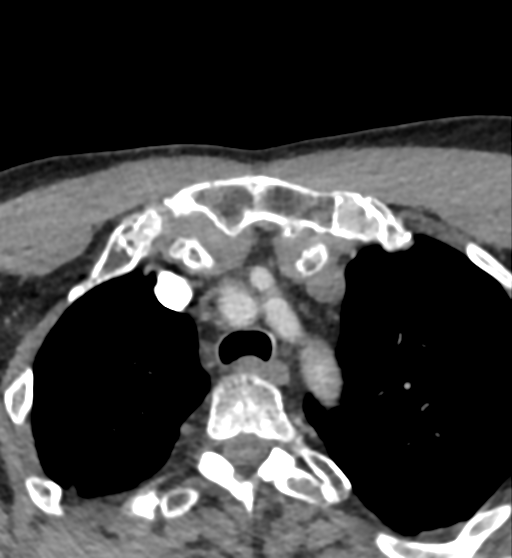
[im 104/364  bone]
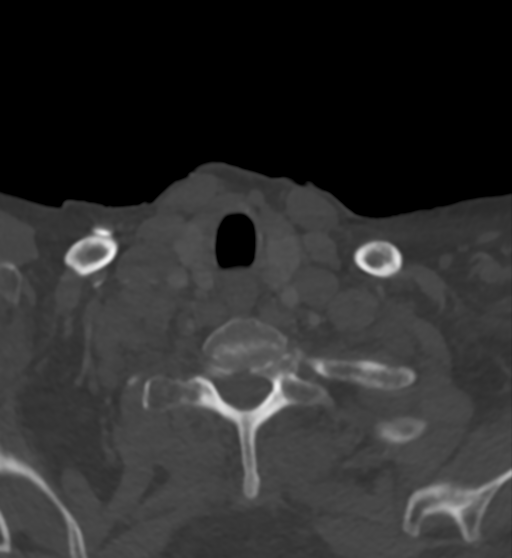
[im 156/364  soft-tissue]
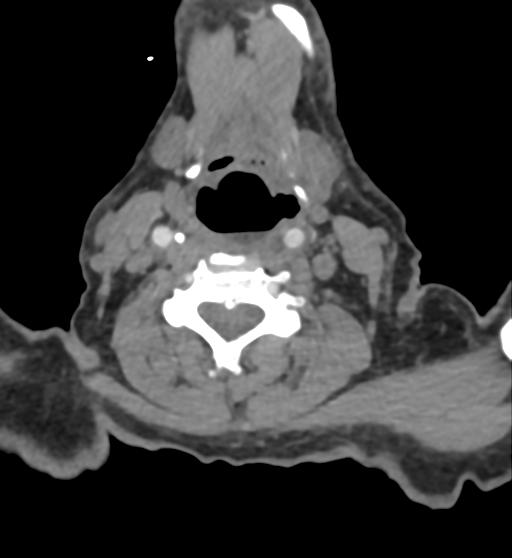
[im 208/364  bone]
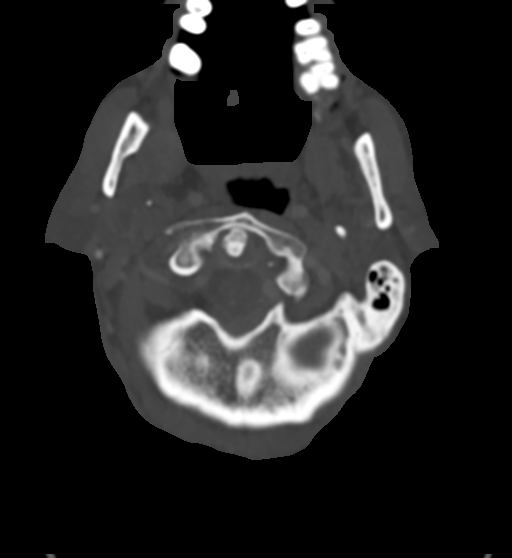
[im 260/364  soft-tissue]
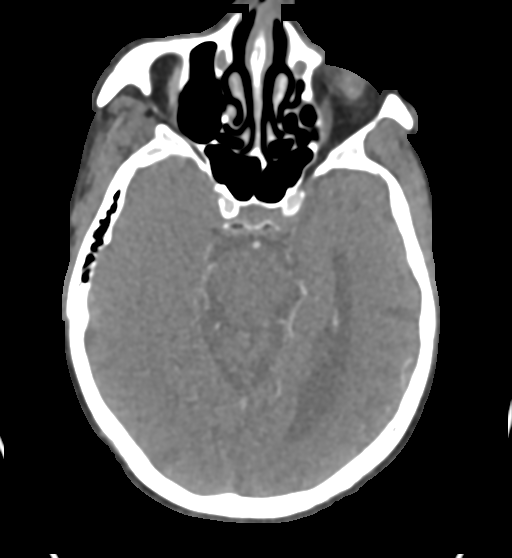
[im 312/364  bone]
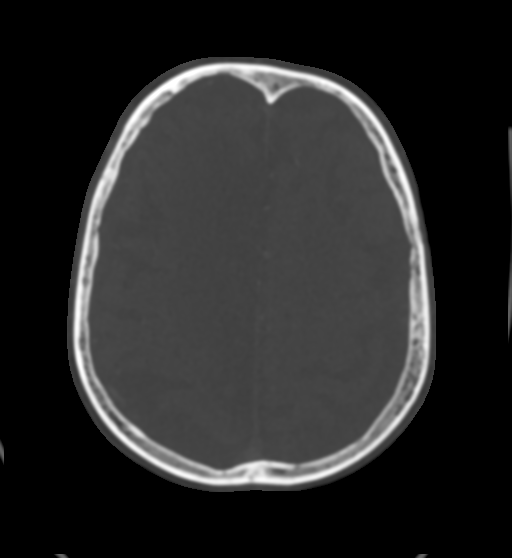

[Series 11: sag thins · sagittal · 0.48mm/px · 2 of 201 slices shown]
[im 46/201  soft-tissue]
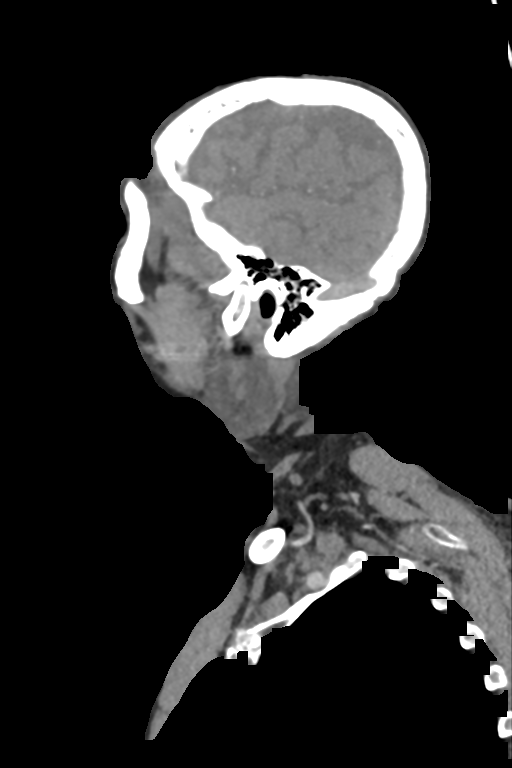
[im 156/201  soft-tissue]
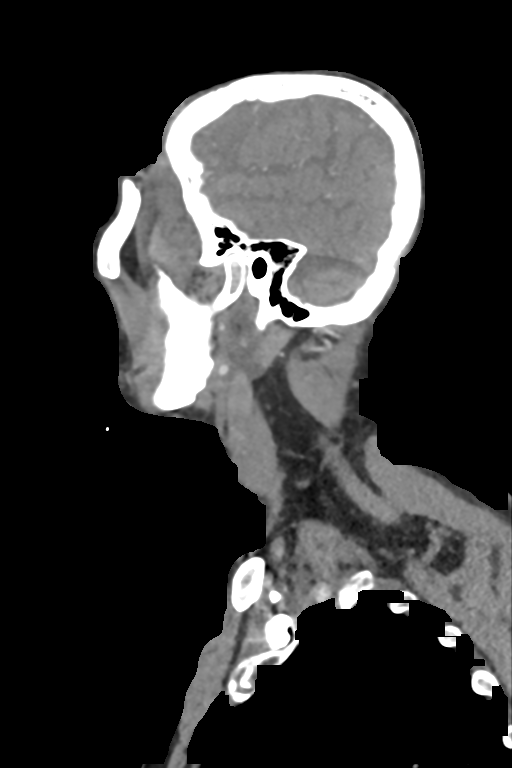

[10 of 36 positions shown; findings below may reference images not displayed]

FINDINGS: CT HEAD FINDINGS

Brain: There is no mass, hemorrhage or extra-axial collection. There
is generalized atrophy without lobar predilection. There is
hypoattenuation of the periventricular white matter, most commonly
indicating chronic ischemic microangiopathy. There is an old right
ACA territory infarct.

Vascular: Atherosclerotic calcification.

Skull: The visualized skull base, calvarium and extracranial soft
tissues are normal.

Sinuses/Orbits: No fluid levels or advanced mucosal thickening of
the visualized paranasal sinuses. No mastoid or middle ear effusion.
The orbits are normal.

ASPECTS (Alberta Stroke Program Early CT Score)

- Ganglionic level infarction (caudate, lentiform nuclei, internal
capsule, insula, M1-M3 cortex): 7

- Supraganglionic infarction (M4-M6 cortex): 3

Total score (0-10 with 10 being normal): 10

CTA NECK FINDINGS

SKELETON: There is no bony spinal canal stenosis. No lytic or
blastic lesion.

OTHER NECK: Normal pharynx, larynx and major salivary glands. No
cervical lymphadenopathy. Unremarkable thyroid gland.

UPPER CHEST: No pneumothorax or pleural effusion. No nodules or
masses.

AORTIC ARCH:

There is calcific atherosclerosis of the aortic arch. There is no
aneurysm, dissection or hemodynamically significant stenosis of the
visualized portion of the aorta. Conventional 3 vessel aortic
branching pattern. The visualized proximal subclavian arteries are
widely patent.

RIGHT CAROTID SYSTEM: No dissection, occlusion or aneurysm. Mild
atherosclerotic calcification at the carotid bifurcation without
hemodynamically significant stenosis.

LEFT CAROTID SYSTEM: No dissection, occlusion or aneurysm. Mild
atherosclerotic calcification at the carotid bifurcation without
hemodynamically significant stenosis.

VERTEBRAL ARTERIES: Left dominant configuration. Atherosclerotic
narrowing of both vertebral artery origins. There is no dissection,
occlusion or flow-limiting stenosis to the skull base (V1-V3
segments).

CTA HEAD FINDINGS

POSTERIOR CIRCULATION:

--Vertebral arteries: Severe stenosis of the distal left V4 segment.
Right V4 segment is normal.

--Inferior cerebellar arteries: Normal.

--Basilar artery: Normal.

--Superior cerebellar arteries: Normal.

--Posterior cerebral arteries (PCA): Normal.

ANTERIOR CIRCULATION:

--Intracranial internal carotid arteries: Atherosclerotic
calcification of the internal carotid arteries at the skull base
without hemodynamically significant stenosis.

--Anterior cerebral arteries (ACA): Chronic occlusion of the right
anterior cerebral artery A2 segment. Atherosclerotic irregularity.

--Middle cerebral arteries (MCA): Severe stenosis of the right M2
segment. Otherwise multifocal atherosclerotic irregularity.

VENOUS SINUSES: As permitted by contrast timing, patent.

ANATOMIC VARIANTS: None

Review of the MIP images confirms the above findings.
IMPRESSION: 1. No acute hemorrhage. ASPECTS is 10. These results were called by
telephone at the time of interpretation on [DATE] at [DATE] to
provider SALAUDIN , who verbally acknowledged these
results.
2. No emergent large vessel occlusion.
3. Severe stenosis of the right MCA M2 segment.
4. Severe stenosis of the left vertebral artery V4 segment.
5. Chronic occlusion of the right anterior cerebral artery A2
segment.

Aortic Atherosclerosis ([VN]-[VN]).

## 2020-12-12 MED ORDER — LORAZEPAM 2 MG/ML IJ SOLN
1.0000 mg | Freq: Once | INTRAMUSCULAR | Status: AC
  Administered 2020-12-12: 1 mg via INTRAVENOUS

## 2020-12-12 MED ORDER — LORAZEPAM 2 MG/ML IJ SOLN
INTRAMUSCULAR | Status: AC
  Filled 2020-12-12: qty 1

## 2020-12-12 MED ORDER — SODIUM CHLORIDE 0.9% FLUSH
3.0000 mL | Freq: Once | INTRAVENOUS | Status: AC
  Administered 2020-12-12: 3 mL via INTRAVENOUS

## 2020-12-12 MED ORDER — SODIUM CHLORIDE 0.9 % IV SOLN
200.0000 mg | Freq: Once | INTRAVENOUS | Status: AC
  Administered 2020-12-13: 200 mg via INTRAVENOUS
  Filled 2020-12-12: qty 20

## 2020-12-12 MED ORDER — IOHEXOL 350 MG/ML SOLN
75.0000 mL | Freq: Once | INTRAVENOUS | Status: AC | PRN
  Administered 2020-12-12: 75 mL via INTRAVENOUS

## 2020-12-12 MED ORDER — SODIUM CHLORIDE 0.9 % IV SOLN
50.0000 mg | Freq: Two times a day (BID) | INTRAVENOUS | Status: DC
  Administered 2020-12-13 – 2020-12-15 (×6): 50 mg via INTRAVENOUS
  Filled 2020-12-12 (×9): qty 5

## 2020-12-12 MED ORDER — LEVETIRACETAM IN NACL 1500 MG/100ML IV SOLN
1500.0000 mg | Freq: Once | INTRAVENOUS | Status: AC
  Administered 2020-12-12: 1500 mg via INTRAVENOUS
  Filled 2020-12-12: qty 100

## 2020-12-12 NOTE — Consult Note (Addendum)
NEUROLOGY CONSULTATION NOTE   Date of service: December 12, 2020 Patient Name: Jeffery Haynes MRN:  [id number] DOB:  05/21/1931 Reason for consult: "RUE contracture with R gaze" Requesting Provider: Rolan Bucco, MD _ _ _   _ __   _ __ _ _  __ __   _ __   __ _  History of Present Illness  Jeffery Haynes is a 87 y.o. male with PMH significant for alzheimers, HTN, hx of prior R ACA territory stroke, dementia, nursing home resident, prior hx of seizures in the setting og hyponatremia who is brought in as a stroke code for less responsive, LUE contracture.  Unable to provide any history. Per limited history provided by EMS, was last seen normal at 1000 on 12/12/20. In the afternoon, was found lethargic and not himself. Usually he is very combative. He was noted to have LUE contracture which is new for him and so EMS was called.  He has a hx of prior strokes and afibb and is on Eliquis.  He is on Depakote for behavioural issues and also on Ativan at his SNF. He was taken off Vimpat outpatient by his neurologist at [facility] due to family concerns about somnolence.  CTH w/o contrast with prior R ACA stroke. No ICH. CTA with multifocal multivessel stenosis but no LVO.   ROS  Unable to get detailed ROS due to encephalopathy and poor responsiveness. Past History   Past Medical History:  Diagnosis Date  . Alzheimer disease (HCC)   . Dementia (HCC)   . Diabetes mellitus without complication (HCC)    No past surgical history on file. Family History  Problem Relation Age of Onset  . Seizures Neg Hx    Social History   Socioeconomic History  . Marital status: Single    Spouse name: Not on file  . Number of children: Not on file  . Years of education: Not on file  . Highest education level: Not on file  Occupational History  . Not on file  Tobacco Use  . Smoking status: Never  . Smokeless tobacco: Never  Vaping Use  . Vaping Use: Never used  Substance and Sexual Activity  .  Alcohol use: Not Currently  . Drug use: Never  . Sexual activity: Not on file  Other Topics Concern  . Not on file  Social History Narrative  . Not on file   Social Determinants of Health   Financial Resource Strain: Not on file  Food Insecurity: Not on file  Transportation Needs: Not on file  Physical Activity: Not on file  Stress: Not on file  Social Connections: Not on file   Allergies  Allergen Reactions  . Olanzapine     Possible neuroleptic malignant syndrome    Medications  (Not in a hospital admission)    Vitals   There were no vitals filed for this visit.   There is no height or weight on file to calculate BMI.  Physical Exam   General: Laying comfortably in bed; in no acute distress.  HENT: Normal oropharynx and mucosa. Normal external appearance of ears and nose.  Neck: Supple, no pain or tenderness  CV: No JVD. No peripheral edema.  Pulmonary: Symmetric Chest rise. Normal respiratory effort.  Abdomen: Soft to touch, non-tender.  Ext: No cyanosis, edema, or deformity  Skin: No rash. Normal palpation of skin.   Musculoskeletal: Normal digits and nails by inspection. No clubbing.   Neurologic Examination  Mental status/Cognition: somnolent, doe snot respond to voice  or loud clap. To noxious stimuli, he grimaces and brings his RUE close to his face. Speech/language: none, mute. Cranial nerves:  Corneals: + BL Pupils: 44mm BL, equal round and reactive to light.   Motor/sensory:  Muscle bulk: poor, tone increased in LUE and LLE more so than RUE and RLE. Unable to do detailed strength testing 2/2 encephalopathy. He localizes with his RUE, withdraws BL lower extremities to noxious stimuli. LUE is contracted and held up at the chest with increased tone.  Reflexes:  Right Left Comments  Pectoralis      Biceps (C5/6) 2 2   Brachioradialis (C5/6) 2 2    Triceps (C6/7) 2 2    Patellar (L3/4) 3 3    Achilles (S1) 1 1    Hoffman      Plantar up up   Jaw  jerk     Coordination/Complex Motor:  Unable to assess 2/2 encephalopathy. Gait: unable to assess given his somnolence.  Labs   CBC:  Recent Labs  Lab 12/12/20 2211  HGB 13.9  HCT 41.0    Basic Metabolic Panel:  Lab Results  Component Value Date   NA 135 12/12/2020   K 4.9 12/12/2020   CO2 28 08/30/2020   GLUCOSE 168 (H) 12/12/2020   BUN 30 (H) 12/12/2020   CREATININE 0.90 12/12/2020   CALCIUM 9.1 08/30/2020   GFRNONAA >60 08/30/2020   GFRAA >60 09/28/2019   Lipid Panel: No results found for: LDLCALC HgbA1c:  Lab Results  Component Value Date   HGBA1C 6.1 (H) 06/07/2020   Urine Drug Screen: No results found for: LABOPIA, COCAINSCRNUR, LABBENZ, AMPHETMU, THCU, LABBARB  Alcohol Level No results found for: [facility]  CT Head without contrast: Personally reviewed and notable for prior R ACA stroke. Otherwise CTH was negative for a large hypodensity concerning for a large territory infarct or hyperdensity concerning for an ICH  CT angio Head and Neck with contrast: Multifocal multivessel stenosis but no LVO.  MRI Brain: pending  cEEG:  Pending  Neurology notes mention that prior EEG in 2012 noted R slowing and R sharp waves. EEG in 09/2016 with diffuse slowing and focal slowing over the left.  Impression   Jeffery Haynes is a 87 y.o. male with PMH significant for with PMH significant for alzheimers, HTN, hx of prior R ACA territory stroke, dementia, nursing home resident, prior hx of seizures in the setting of hyponatremia and R sharps on EEG in 2012 who is brought in as a stroke code for less responsive, LUE contracture. He is outside tNK window and has no LVO and thus not a candidate for thrombectomy. I suspect that his current presentation is likely a seizure/status epilepticus.   He was loaded with Keppra 1500 and given Ativan 1mg . On reviewing chart, seems like he did not tolerate Keppra so will do Vimpat going forward and will put him on cEEG for monitoring for  seizures or subclinical status.  Recommendations  - Keppra 1500mg  IV once - Vimpat 200mg  IV once, followed by Vimpat 50mg  BID(most recent EKG from 6/22 with normal PR interval) - Ativan 1-2mg  for seizure lasting more than 3 mins. Will have to be cautious as he is DNR per facility [facility]. - Seizure precautions. - cEEG ordered and tech notified. ______________________________________________________________________   Plan discussed with Dr. with the ED team.  This patient is critically ill and at significant risk of neurological worsening, death and care requires constant monitoring of vital signs, hemodynamics,respiratory and cardiac monitoring, neurological assessment,  discussion with family, other specialists and medical decision making of high complexity. I spent 35 minutes of neurocritical care time  in the care of  this patient. This was time spent independent of any time provided by nurse practitioner or PA.  Erick Blinks [facility] Pager Number [phone] 12/12/2020  11:05 PM   Thank you for the opportunity to take part in the care of this patient. If you have any further questions, please contact the neurology consultation attending.  Signed,  Erick Blinks [facility] Pager Number [phone] _ _ _   _ __   _ __ _ _  __ __   _ __   __ _

## 2020-12-12 NOTE — ED Triage Notes (Signed)
Pt from [facility]. Staff reported pt LKW 10am, has been tired and lethargic throughout the day. Staff came in around 2130 and noted pt to be contracted on the L arm. At baseline, pt has dementia and is combative. EMS reported r sided gaze and L arm posturing. EMS VS 196/92, pulse 82, cbg 151

## 2020-12-12 NOTE — H&P (Signed)
History and Physical    Jeffery Haynes ZOX:[id number] DOB: 06/19/1931 DOA: 12/12/2020  PCP: Patient, No Pcp Per (Inactive) Patient coming from: Nursing home  Chief Complaint: Code stroke  HPI: Jeffery Haynes is a 87 y.o. male with medical history significant of Alzheimer's dementia, paroxysmal A. fib, non-insulin-dependent type II diabetes, history of prior right ACA territory stroke, history of seizures in the setting of hyponatremia, hypertension, BPH presented to the ED via EMS as code stroke for evaluation of patient being less responsive, LUE contracture.  Last seen normal at 10 AM today.  In the afternoon, he was found lethargic.  Out of tPA window at the time of ED arrival.  Patient nonverbal.  Labs showing no leukocytosis.  Hemoglobin 11.3, not significantly changed from baseline.  Platelet count normal.  INR 1.0.  No significant electrolyte derangements. CT head negative for acute finding.  CTA head and neck negative for LVO.  Showing severe stenosis of the right MCA M2 segment and severe stenosis of the left vertebral artery V4 segment.  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 Neurology felt that the patient's presentation was more likely due to seizure activity.  He was given IV Keppra 1500 mg.  Also started on IV Vimpat 200 mg followed by maintenance dose of 50 mg every 12 hours.  In addition, was given Ativan 1 mg.  Continuous EEG monitoring ordered.  Patient is nonverbal, no history could be obtained from him.  Review of Systems:  All systems reviewed and apart from history of presenting illness, are negative.  Past Medical History:  Diagnosis Date   Alzheimer disease (HCC)    Dementia (HCC)    Diabetes mellitus without complication (HCC)    Stroke [facility])     History reviewed. No pertinent surgical history.   reports that he has never smoked. He has never used smokeless tobacco. He reports that he does not currently use alcohol. He reports that he  does not use drugs.  Allergies  Allergen Reactions   Olanzapine     Possible neuroleptic malignant syndrome    Family History  Problem Relation Age of Onset   Seizures Neg Hx     Prior to Admission medications   Medication Sig Start Date End Date Taking? Authorizing Provider  acetaminophen (TYLENOL) 325 MG tablet Take 650 mg by mouth 4 (four) times daily.    [provider]  amLODipine (NORVASC) 5 MG tablet Take 5 mg by mouth in the morning.    [provider]  apixaban (ELIQUIS) 5 MG TABS tablet Take 1 tablet (5 mg total) by mouth 2 (two) times daily. 02/01/20 01/31/21  Pokhrel, Rebekah Chesterfield, MD  benzonatate (TESSALON) 200 MG capsule Take 200 mg by mouth every 8 (eight) hours as needed for cough.    [provider]  citalopram (CELEXA) 10 MG tablet Take 15 mg by mouth in the morning.    [provider]  diclofenac Sodium (VOLTAREN) 1 % GEL Apply 2 g topically See admin instructions. Apply 2 grams to both shoulders two times a day    [provider]  donepezil (ARICEPT) 5 MG tablet Take 1 tablet (5 mg total) by mouth at bedtime. 02/01/20 01/31/21  Pokhrel, Rebekah Chesterfield, MD  feeding supplement, GLUCERNA SHAKE, (GLUCERNA SHAKE) LIQD Take 237 mLs by mouth in the morning and at bedtime.    [provider]  guaifenesin (ROBITUSSIN) 100 MG/5ML syrup Take 200 mg by mouth every 4 (four) hours as needed for cough.  [provider]  lidocaine (LIDODERM) 5 % Place 1 patch onto the skin daily. Remove & Discard patch within 12 hours or as directed by MD Patient taking differently: Place 1 patch onto the skin See admin instructions. Apply 1 path to left knee and 1 patch to lower back. 12 hours on, 12 hours off 06/10/20   Alwyn Ren, MD  metFORMIN (GLUCOPHAGE) 500 MG tablet Take 500 mg by mouth 2 (two) times daily.    [provider]  Multiple Vitamins-Minerals (MULTIVITAMIN WITH MINERALS) tablet Take 1 tablet by mouth daily.     [provider]  NONFORMULARY OR COMPOUNDED ITEM Apply 1 application topically See admin instructions. ABH compounded gel: Lorazepam 0.5 mg/Diphenhydramine HCl 25 mg/Haloperidol 0.5 mg/ml and Lipoderm gel- Apply to skin  on arms every 8 hours AND an additional application every 8 hours as needed for restlessness or agitation    [provider]  OLANZapine (ZYPREXA) 2.5 MG tablet Take 2.5 mg by mouth at bedtime. 05/25/20   [provider]  polyethylene glycol powder (GLYCOLAX/MIRALAX) 17 GM/SCOOP powder Take 17 g by mouth daily.    [provider]  potassium chloride (KLOR-CON) 10 MEQ tablet Take 1 tablet (10 mEq total) by mouth daily. 02/01/20   Pokhrel, Rebekah Chesterfield, MD  sennosides-docusate sodium (SENOKOT-S) 8.6-50 MG tablet Take 2 tablets by mouth 2 (two) times daily.    [provider]  tamsulosin (FLOMAX) 0.4 MG CAPS capsule Take 0.4 mg by mouth at bedtime.    [provider]  traMADol (ULTRAM) 50 MG tablet Take 50 mg by mouth every 6 (six) hours as needed for moderate pain (related to left leg fracture).    [provider]    Physical Exam: Vitals:   12/12/20 2230 12/12/20 2236  BP: (!) 149/93   Pulse: 82 77  Resp:  16  Temp:  97.8 F (36.6 C)  TempSrc:  Oral  SpO2: 97% 97%    Physical Exam Constitutional:      General: He is not in acute distress. HENT:     Head: Normocephalic and atraumatic.  Eyes:     Pupils: Pupils are equal, round, and reactive to light.     Comments: Left eye deviates to the left  Cardiovascular:     Rate and Rhythm: Normal rate and regular rhythm.     Pulses: Normal pulses.  Pulmonary:     Effort: Pulmonary effort is normal. No respiratory distress.     Breath sounds: Normal breath sounds. No wheezing or rales.     Comments: Satting 97-98% on room air Abdominal:     General: Bowel sounds are normal. There is no distension.     Palpations: Abdomen is soft.     Tenderness: There is no abdominal  tenderness.  Musculoskeletal:        General: No swelling or tenderness.     Cervical back: Normal range of motion and neck supple.  Skin:    General: Skin is warm and dry.  Neurological:     Comments: Nonverbal, not following commands Moving right lower extremity spontaneously Left upper extremity contracture No obvious facial droop     Labs on Admission: I have personally reviewed following labs and imaging studies  CBC: Recent Labs  Lab 12/12/20 2211 12/12/20 2257  WBC  --  7.6  NEUTROABS  --  5.4  HGB 13.9 11.3*  HCT 41.0 35.6*  MCV  --  93.2  PLT  --  263   Basic  Metabolic Panel: Recent Labs  Lab 12/12/20 2211 12/12/20 2257  NA 135 134*  K 4.9 4.6  CL 102 101  CO2  --  25  GLUCOSE 168* 156*  BUN 30* 25*  CREATININE 0.90 0.93  CALCIUM  --  9.2   GFR: CrCl cannot be calculated (Unknown ideal weight.). Liver Function Tests: Recent Labs  Lab 12/12/20 2257  AST 11*  ALT 11  ALKPHOS 78  BILITOT 0.4  PROT 6.1*  ALBUMIN 3.4*   No results for input(s): LIPASE, AMYLASE in the last 168 hours. No results for input(s): AMMONIA in the last 168 hours. Coagulation Profile: Recent Labs  Lab 12/12/20 2257  INR 1.0   Cardiac Enzymes: No results for input(s): CKTOTAL, CKMB, CKMBINDEX, TROPONINI in the last 168 hours. BNP (last 3 results) No results for input(s): PROBNP in the last 8760 hours. HbA1C: No results for input(s): HGBA1C in the last 72 hours. CBG: Recent Labs  Lab 12/12/20 2206  GLUCAP 151*   Lipid Profile: No results for input(s): CHOL, HDL, LDLCALC, TRIG, CHOLHDL, LDLDIRECT in the last 72 hours. Thyroid Function Tests: No results for input(s): TSH, T4TOTAL, FREET4, T3FREE, THYROIDAB in the last 72 hours. Anemia Panel: No results for input(s): VITAMINB12, FOLATE, FERRITIN, TIBC, IRON, RETICCTPCT in the last 72 hours. Urine analysis:    Component Value Date/Time   COLORURINE YELLOW 08/30/2020 0042   APPEARANCEUR HAZY (A) 08/30/2020 0042    LABSPEC 1.023 08/30/2020 0042   PHURINE 5.0 08/30/2020 0042   GLUCOSEU 50 (A) 08/30/2020 0042   HGBUR NEGATIVE 08/30/2020 0042   BILIRUBINUR NEGATIVE 08/30/2020 0042   KETONESUR 20 (A) 08/30/2020 0042   PROTEINUR NEGATIVE 08/30/2020 0042   NITRITE NEGATIVE 08/30/2020 0042   LEUKOCYTESUR NEGATIVE 08/30/2020 0042    Radiological Exams on Admission: CT HEAD CODE STROKE WO CONTRAST  Result Date: 12/12/2020 CLINICAL DATA:  Neuro deficit, acute, stroke suspected; Stroke/TIA, assess extracranial arteries; Stroke/TIA, assess intracranial arteries EXAM: CT HEAD WITHOUT CONTRAST CT ANGIOGRAPHY OF THE HEAD AND NECK TECHNIQUE: Contiguous axial images were obtained from the base of the skull through the vertex without intravenous contrast. Multidetector CT imaging of the head and neck was performed using the standard protocol during bolus administration of intravenous contrast. Multiplanar CT image reconstructions and MIPs were obtained to evaluate the vascular anatomy. Carotid stenosis measurements (when applicable) are obtained utilizing NASCET criteria, using the distal internal carotid diameter as the denominator. CONTRAST:  75mL OMNIPAQUE IOHEXOL 350 MG/ML SOLN COMPARISON:  None. FINDINGS: CT HEAD FINDINGS Brain: There is no mass, hemorrhage or extra-axial collection. There is generalized atrophy without lobar predilection. There is hypoattenuation of the periventricular white matter, most commonly indicating chronic ischemic microangiopathy. There is an old right ACA territory infarct. Vascular: Atherosclerotic calcification. Skull: The visualized skull base, calvarium and extracranial soft tissues are normal. Sinuses/Orbits: No fluid levels or advanced mucosal thickening of the visualized paranasal sinuses. No mastoid or middle ear effusion. The orbits are normal. ASPECTS (Alberta Stroke Program Early CT Score) - Ganglionic level infarction (caudate, lentiform nuclei, internal capsule, insula, M1-M3  cortex): 7 - Supraganglionic infarction (M4-M6 cortex): 3 Total score (0-10 with 10 being normal): 10 CTA NECK FINDINGS SKELETON: There is no bony spinal canal stenosis. No lytic or blastic lesion. OTHER NECK: Normal pharynx, larynx and major salivary glands. No cervical lymphadenopathy. Unremarkable thyroid gland. UPPER CHEST: No pneumothorax or pleural effusion. No nodules or masses. AORTIC ARCH: There is calcific atherosclerosis of the aortic arch. There is no aneurysm, dissection or hemodynamically  significant stenosis of the visualized portion of the aorta. Conventional 3 vessel aortic branching pattern. The visualized proximal subclavian arteries are widely patent. RIGHT CAROTID SYSTEM: No dissection, occlusion or aneurysm. Mild atherosclerotic calcification at the carotid bifurcation without hemodynamically significant stenosis. LEFT CAROTID SYSTEM: No dissection, occlusion or aneurysm. Mild atherosclerotic calcification at the carotid bifurcation without hemodynamically significant stenosis. VERTEBRAL ARTERIES: Left dominant configuration. Atherosclerotic narrowing of both vertebral artery origins. There is no dissection, occlusion or flow-limiting stenosis to the skull base (V1-V3 segments). CTA HEAD FINDINGS POSTERIOR CIRCULATION: --Vertebral arteries: Severe stenosis of the distal left V4 segment. Right V4 segment is normal. --Inferior cerebellar arteries: Normal. --Basilar artery: Normal. --Superior cerebellar arteries: Normal. --Posterior cerebral arteries (PCA): Normal. ANTERIOR CIRCULATION: --Intracranial internal carotid arteries: Atherosclerotic calcification of the internal carotid arteries at the skull base without hemodynamically significant stenosis. 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 Atherosclerotic irregularity. --Middle cerebral arteries (MCA): Severe stenosis of the right M2 segment. Otherwise multifocal atherosclerotic  irregularity. VENOUS SINUSES: As permitted by contrast timing, patent. ANATOMIC VARIANTS: None Review of the MIP images confirms the above findings. IMPRESSION: 1. No acute hemorrhage. ASPECTS is 10. These results were called by telephone at the time of interpretation on 12/12/2020 at 10:33 pm to provider [facility] , who verbally acknowledged these results. 2. No emergent large vessel occlusion. 3. Severe stenosis of the right MCA M2 segment. 4. Severe stenosis of the left vertebral artery V4 segment. 5. 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 Aortic Atherosclerosis (ICD10-I70.0). Electronically Signed   By: Deatra Robinson M.D.   On: 12/12/2020 22:44   CT ANGIO HEAD CODE STROKE  Result Date: 12/12/2020 CLINICAL DATA:  Neuro deficit, acute, stroke suspected; Stroke/TIA, assess extracranial arteries; Stroke/TIA, assess intracranial arteries EXAM: CT HEAD WITHOUT CONTRAST CT ANGIOGRAPHY OF THE HEAD AND NECK TECHNIQUE: Contiguous axial images were obtained from the base of the skull through the vertex without intravenous contrast. Multidetector CT imaging of the head and neck was performed using the standard protocol during bolus administration of intravenous contrast. Multiplanar CT image reconstructions and MIPs were obtained to evaluate the vascular anatomy. Carotid stenosis measurements (when applicable) are obtained utilizing NASCET criteria, using the distal internal carotid diameter as the denominator. CONTRAST:  86mL OMNIPAQUE IOHEXOL 350 MG/ML SOLN COMPARISON:  None. FINDINGS: CT HEAD FINDINGS Brain: There is no mass, hemorrhage or extra-axial collection. There is generalized atrophy without lobar predilection. There is hypoattenuation of the periventricular white matter, most commonly indicating chronic ischemic microangiopathy. There is an old right ACA territory infarct. Vascular: Atherosclerotic calcification. Skull: The visualized skull base, calvarium and extracranial  soft tissues are normal. Sinuses/Orbits: No fluid levels or advanced mucosal thickening of the visualized paranasal sinuses. No mastoid or middle ear effusion. The orbits are normal. ASPECTS (Alberta Stroke Program Early CT Score) - Ganglionic level infarction (caudate, lentiform nuclei, internal capsule, insula, M1-M3 cortex): 7 - Supraganglionic infarction (M4-M6 cortex): 3 Total score (0-10 with 10 being normal): 10 CTA NECK FINDINGS SKELETON: There is no bony spinal canal stenosis. No lytic or blastic lesion. OTHER NECK: Normal pharynx, larynx and major salivary glands. No cervical lymphadenopathy. Unremarkable thyroid gland. UPPER CHEST: No pneumothorax or pleural effusion. No nodules or masses. AORTIC ARCH: There is calcific atherosclerosis of the aortic arch. There is no aneurysm, dissection or hemodynamically significant stenosis of the visualized portion of the aorta. Conventional 3 vessel aortic branching pattern. The visualized proximal subclavian arteries are widely patent.  RIGHT CAROTID SYSTEM: No dissection, occlusion or aneurysm. Mild atherosclerotic calcification at the carotid bifurcation without hemodynamically significant stenosis. LEFT CAROTID SYSTEM: No dissection, occlusion or aneurysm. Mild atherosclerotic calcification at the carotid bifurcation without hemodynamically significant stenosis. VERTEBRAL ARTERIES: Left dominant configuration. Atherosclerotic narrowing of both vertebral artery origins. There is no dissection, occlusion or flow-limiting stenosis to the skull base (V1-V3 segments). CTA HEAD FINDINGS POSTERIOR CIRCULATION: --Vertebral arteries: Severe stenosis of the distal left V4 segment. Right V4 segment is normal. --Inferior cerebellar arteries: Normal. --Basilar artery: Normal. --Superior cerebellar arteries: Normal. --Posterior cerebral arteries (PCA): Normal. ANTERIOR CIRCULATION: --Intracranial internal carotid arteries: Atherosclerotic calcification of the internal carotid  arteries at the skull base without hemodynamically significant stenosis. 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 Atherosclerotic irregularity. --Middle cerebral arteries (MCA): Severe stenosis of the right M2 segment. Otherwise multifocal atherosclerotic irregularity. VENOUS SINUSES: As permitted by contrast timing, patent. ANATOMIC VARIANTS: None Review of the MIP images confirms the above findings. IMPRESSION: 1. No acute hemorrhage. ASPECTS is 10. These results were called by telephone at the time of interpretation on 12/12/2020 at 10:33 pm to provider [facility] , who verbally acknowledged these results. 2. No emergent large vessel occlusion. 3. Severe stenosis of the right MCA M2 segment. 4. Severe stenosis of the left vertebral artery V4 segment. 5. 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 Aortic Atherosclerosis (ICD10-I70.0). Electronically Signed   By: Deatra Robinson M.D.   On: 12/12/2020 22:44   CT ANGIO NECK CODE STROKE  Result Date: 12/12/2020 CLINICAL DATA:  Neuro deficit, acute, stroke suspected; Stroke/TIA, assess extracranial arteries; Stroke/TIA, assess intracranial arteries EXAM: CT HEAD WITHOUT CONTRAST CT ANGIOGRAPHY OF THE HEAD AND NECK TECHNIQUE: Contiguous axial images were obtained from the base of the skull through the vertex without intravenous contrast. Multidetector CT imaging of the head and neck was performed using the standard protocol during bolus administration of intravenous contrast. Multiplanar CT image reconstructions and MIPs were obtained to evaluate the vascular anatomy. Carotid stenosis measurements (when applicable) are obtained utilizing NASCET criteria, using the distal internal carotid diameter as the denominator. CONTRAST:  75mL OMNIPAQUE IOHEXOL 350 MG/ML SOLN COMPARISON:  None. FINDINGS: CT HEAD FINDINGS Brain: There is no mass, hemorrhage or extra-axial collection. There  is generalized atrophy without lobar predilection. There is hypoattenuation of the periventricular white matter, most commonly indicating chronic ischemic microangiopathy. There is an old right ACA territory infarct. Vascular: Atherosclerotic calcification. Skull: The visualized skull base, calvarium and extracranial soft tissues are normal. Sinuses/Orbits: No fluid levels or advanced mucosal thickening of the visualized paranasal sinuses. No mastoid or middle ear effusion. The orbits are normal. ASPECTS (Alberta Stroke Program Early CT Score) - Ganglionic level infarction (caudate, lentiform nuclei, internal capsule, insula, M1-M3 cortex): 7 - Supraganglionic infarction (M4-M6 cortex): 3 Total score (0-10 with 10 being normal): 10 CTA NECK FINDINGS SKELETON: There is no bony spinal canal stenosis. No lytic or blastic lesion. OTHER NECK: Normal pharynx, larynx and major salivary glands. No cervical lymphadenopathy. Unremarkable thyroid gland. UPPER CHEST: No pneumothorax or pleural effusion. No nodules or masses. AORTIC ARCH: There is calcific atherosclerosis of the aortic arch. There is no aneurysm, dissection or hemodynamically significant stenosis of the visualized portion of the aorta. Conventional 3 vessel aortic branching pattern. The visualized proximal subclavian arteries are widely patent. RIGHT CAROTID SYSTEM: No dissection, occlusion or aneurysm. Mild atherosclerotic calcification at the carotid bifurcation without hemodynamically significant stenosis. LEFT CAROTID SYSTEM: No  dissection, occlusion or aneurysm. Mild atherosclerotic calcification at the carotid bifurcation without hemodynamically significant stenosis. VERTEBRAL ARTERIES: Left dominant configuration. Atherosclerotic narrowing of both vertebral artery origins. There is no dissection, occlusion or flow-limiting stenosis to the skull base (V1-V3 segments). CTA HEAD FINDINGS POSTERIOR CIRCULATION: --Vertebral arteries: Severe stenosis of the  distal left V4 segment. Right V4 segment is normal. --Inferior cerebellar arteries: Normal. --Basilar artery: Normal. --Superior cerebellar arteries: Normal. --Posterior cerebral arteries (PCA): Normal. ANTERIOR CIRCULATION: --Intracranial internal carotid arteries: Atherosclerotic calcification of the internal carotid arteries at the skull base without hemodynamically significant stenosis. 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 Atherosclerotic irregularity. --Middle cerebral arteries (MCA): Severe stenosis of the right M2 segment. Otherwise multifocal atherosclerotic irregularity. VENOUS SINUSES: As permitted by contrast timing, patent. ANATOMIC VARIANTS: None Review of the MIP images confirms the above findings. IMPRESSION: 1. No acute hemorrhage. ASPECTS is 10. These results were called by telephone at the time of interpretation on 12/12/2020 at 10:33 pm to provider [facility] , who verbally acknowledged these results. 2. No emergent large vessel occlusion. 3. Severe stenosis of the right MCA M2 segment. 4. Severe stenosis of the left vertebral artery V4 segment. 5. 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 Aortic Atherosclerosis (ICD10-I70.0). Electronically Signed   By: Deatra Robinson M.D.   On: 12/12/2020 22:44    EKG: Independently reviewed.  Poor quality study with artifact in multiple leads making interpretation challenging.  Repeat EKG ordered and currently pending.  Assessment/Plan Principal Problem:   Seizure [facility]) Active Problems:   Alzheimer's dementia (HCC)   Essential hypertension   Diabetes mellitus type 2 in nonobese [facility])   Concern for seizure He has a history of seizures in the setting of hyponatremia.  He was previously on Vimpat which was discontinued by his outpatient neurologist at [facility] due to family concerns about somnolence.  He presents to the ED today from his nursing home as he  was found less responsive with LUE contracture.  Out of tPA window at the time of ED arrival.  CT head negative for acute finding.  CTA head and neck negative for LVO.  Showing severe stenosis of the right MCA M2 segment and severe stenosis of the left vertebral artery V4 segment.  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 Neurology feels that the patient's presentation is likely a seizure/status epilepticus. -Continuous EEG monitoring -IV Keppra 1500 mg x 1 given -IV Vimpat 200 mg once, followed by Vimpat 50 mg twice daily -Ativan 1-2 mg for seizure lasting more than 3 minutes, although be very cautious as he is DNR/DNI.  He is currently able to protect his airway but is at very high risk for decompensation. -Seizure precautions  Hypertension Blood pressure currently elevated with systolic in 190s and diastolic in the 130s.  Discussed blood pressure parameters with neurologist, recommending SBP <180 and DBP <100 at this time. -IV Hydralazine PRN  Paroxysmal A. Fib Per pharmacy med rec, patient is not on Eliquis as it was discontinued by his physician.  Does not appear to be on any rate control medication either.  Not tachycardic at present. -Cardiac monitoring, EKG pending -Discussed with neurology, okay with holding off anticoagulation for now  Non-insulin-dependent type II diabetes -Check A1c.  Sliding scale insulin very sensitive every 4 hours as patient is n.p.o.  Alzheimer's dementia Mood disorder BPH -Patient is not able to take his home p.o. meds at this time.  DVT prophylaxis: SCDs Code Status: DNR/DNI -confirmed with the patient's wife. Family Communication: Wife updated over the phone. Disposition Plan: Status is: Inpatient  Remains inpatient appropriate because:Altered mental status, Ongoing diagnostic testing needed not appropriate for outpatient work up, and Inpatient level of care appropriate due to severity of illness  Dispo: The patient is  from:  Nursing home              Anticipated d/c is to:  Nursing home              Patient currently is not medically stable to d/c.   Difficult to place patient No  Level of care: Level of care: Progressive  The medical decision making on this patient was of high complexity and the patient is at high risk for clinical deterioration, therefore this is a level 3 visit.  John Giovanni MD [facility]  If 7PM-7AM, please contact night-coverage [email or web address]  12/13/2020, 12:24 AM

## 2020-12-12 NOTE — ED Notes (Signed)
Wife Draper Gallon [phone] would like an update asap

## 2020-12-12 NOTE — ED Provider Notes (Signed)
[facility] EMERGENCY DEPARTMENT Provider Note   CSN: [id number] Arrival date & time: 12/12/20  2204  An emergency department physician performed an initial assessment on this suspected stroke patient at 2204.  History Chief Complaint  Patient presents with   Code Stroke    Jeffery Haynes is a 87 y.o. male.  Patient is a 87 year old male who presents from a nursing facility, [facility] as a code stroke.  He has a history on chart review of dementia, hypertension, seizures on Vimpat which started after a stroke.  He had a right ACA stroke in April 2018.  He was noted at the nursing facility to have some weakness and contracture of the left arm was not apparent left side gaze deviation.  He has been nonverbal with EMS.  For this reason code stroke was called.  Of note he is on Eliquis.  History is limited due to his nonverbal state.      Past Medical History:  Diagnosis Date   Alzheimer disease (HCC)    Dementia (HCC)    Diabetes mellitus without complication (HCC)    Stroke [facility])     Patient Active Problem List   Diagnosis Date Noted   Seizures (HCC) 12/12/2020   Alzheimer's dementia (HCC) 06/07/2020   Fracture of femoral neck, left, closed (HCC) 06/07/2020   Closed fracture of left distal femur (HCC) 06/07/2020   Essential hypertension 06/07/2020   Diabetes mellitus type 2 in nonobese (HCC) 06/07/2020   Fracture of femoral neck, left (HCC) 06/07/2020   Acute metabolic encephalopathy 01/29/2020   Atrial fibrillation with RVR (HCC) 01/29/2020   Alzheimer disease (HCC)    Dementia (HCC)     History reviewed. No pertinent surgical history.     Family History  Problem Relation Age of Onset   Seizures Neg Hx     Social History   Tobacco Use   Smoking status: Never   Smokeless tobacco: Never  Vaping Use   Vaping Use: Never used  Substance Use Topics   Alcohol use: Not Currently   Drug use: Never    Home Medications Prior to  Admission medications   Medication Sig Start Date End Date Taking? Authorizing Provider  acetaminophen (TYLENOL) 325 MG tablet Take 650 mg by mouth 4 (four) times daily.    [provider]  amLODipine (NORVASC) 5 MG tablet Take 5 mg by mouth in the morning.    [provider]  apixaban (ELIQUIS) 5 MG TABS tablet Take 1 tablet (5 mg total) by mouth 2 (two) times daily. 02/01/20 01/31/21  Pokhrel, Rebekah Chesterfield, MD  benzonatate (TESSALON) 200 MG capsule Take 200 mg by mouth every 8 (eight) hours as needed for cough.    [provider]  citalopram (CELEXA) 10 MG tablet Take 15 mg by mouth in the morning.    [provider]  diclofenac Sodium (VOLTAREN) 1 % GEL Apply 2 g topically See admin instructions. Apply 2 grams to both shoulders two times a day    [provider]  donepezil (ARICEPT) 5 MG tablet Take 1 tablet (5 mg total) by mouth at bedtime. 02/01/20 01/31/21  Pokhrel, Rebekah Chesterfield, MD  feeding supplement, GLUCERNA SHAKE, (GLUCERNA SHAKE) LIQD Take 237 mLs by mouth in the morning and at bedtime.    [provider]  guaifenesin (ROBITUSSIN) 100 MG/5ML syrup Take 200 mg by mouth every 4 (four) hours as needed for cough.    [provider]  lidocaine (LIDODERM) 5 % Place 1 patch  onto the skin daily. Remove & Discard patch within 12 hours or as directed by MD Patient taking differently: Place 1 patch onto the skin See admin instructions. Apply 1 path to left knee and 1 patch to lower back. 12 hours on, 12 hours off 06/10/20   Alwyn Ren, MD  metFORMIN (GLUCOPHAGE) 500 MG tablet Take 500 mg by mouth 2 (two) times daily.    [provider]  Multiple Vitamins-Minerals (MULTIVITAMIN WITH MINERALS) tablet Take 1 tablet by mouth daily.    [provider]  NONFORMULARY OR COMPOUNDED ITEM Apply 1 application topically See admin instructions. ABH compounded gel: Lorazepam 0.5 mg/Diphenhydramine HCl 25 mg/Haloperidol 0.5 mg/ml and  Lipoderm gel- Apply to skin  on arms every 8 hours AND an additional application every 8 hours as needed for restlessness or agitation    [provider]  OLANZapine (ZYPREXA) 2.5 MG tablet Take 2.5 mg by mouth at bedtime. 05/25/20   [provider]  polyethylene glycol powder (GLYCOLAX/MIRALAX) 17 GM/SCOOP powder Take 17 g by mouth daily.    [provider]  potassium chloride (KLOR-CON) 10 MEQ tablet Take 1 tablet (10 mEq total) by mouth daily. 02/01/20   Pokhrel, Rebekah Chesterfield, MD  sennosides-docusate sodium (SENOKOT-S) 8.6-50 MG tablet Take 2 tablets by mouth 2 (two) times daily.    [provider]  tamsulosin (FLOMAX) 0.4 MG CAPS capsule Take 0.4 mg by mouth at bedtime.    [provider]  traMADol (ULTRAM) 50 MG tablet Take 50 mg by mouth every 6 (six) hours as needed for moderate pain (related to left leg fracture).    [provider]    Allergies    Olanzapine  Review of Systems   Review of Systems  Unable to perform ROS: Mental status change   Physical Exam Updated Vital Signs BP (!) 149/93   Pulse 82   Temp 97.8 F (36.6 C) (Oral)   SpO2 97%   Physical Exam Constitutional:      Comments: Awake with eyes open but nonverbal  HENT:     Head: Normocephalic and atraumatic.     Mouth/Throat:     Mouth: Mucous membranes are moist.  Eyes:     Conjunctiva/sclera: Conjunctivae normal.  Cardiovascular:     Rate and Rhythm: Normal rate.  Pulmonary:     Effort: Pulmonary effort is normal.  Abdominal:     General: Abdomen is flat.     Palpations: Abdomen is soft.  Skin:    General: Skin is warm and dry.  Neurological:     Comments: Patient is nonverbal.  Contraction of his left arm with increased spasticity of his left arm and left leg.  No facial drooping.  No definite gaze deviation although he is looking more to the left.    ED Results / Procedures / Treatments   Labs (all labs ordered are listed, but only abnormal results  are displayed) Labs Reviewed  I-STAT CHEM 8, ED - Abnormal; Notable for the following components:      Result Value   BUN 30 (*)    Glucose, Bld 168 (*)    Calcium, Ion 1.09 (*)    All other components within normal limits  CBG MONITORING, ED - Abnormal; Notable for the following components:   Glucose-Capillary 151 (*)    All other components within normal limits  RESP PANEL BY RT-PCR (FLU A&B, COVID) ARPGX2  PROTIME-INR  APTT  CBC  DIFFERENTIAL  COMPREHENSIVE METABOLIC PANEL    EKG  None  Radiology CT HEAD CODE STROKE WO CONTRAST  Result Date: 12/12/2020 CLINICAL DATA:  Neuro deficit, acute, stroke suspected; Stroke/TIA, assess extracranial arteries; Stroke/TIA, assess intracranial arteries EXAM: CT HEAD WITHOUT CONTRAST CT ANGIOGRAPHY OF THE HEAD AND NECK TECHNIQUE: Contiguous axial images were obtained from the base of the skull through the vertex without intravenous contrast. Multidetector CT imaging of the head and neck was performed using the standard protocol during bolus administration of intravenous contrast. Multiplanar CT image reconstructions and MIPs were obtained to evaluate the vascular anatomy. Carotid stenosis measurements (when applicable) are obtained utilizing NASCET criteria, using the distal internal carotid diameter as the denominator. CONTRAST:  75mL OMNIPAQUE IOHEXOL 350 MG/ML SOLN COMPARISON:  None. FINDINGS: CT HEAD FINDINGS Brain: There is no mass, hemorrhage or extra-axial collection. There is generalized atrophy without lobar predilection. There is hypoattenuation of the periventricular white matter, most commonly indicating chronic ischemic microangiopathy. There is an old right ACA territory infarct. Vascular: Atherosclerotic calcification. Skull: The visualized skull base, calvarium and extracranial soft tissues are normal. Sinuses/Orbits: No fluid levels or advanced mucosal thickening of the visualized paranasal sinuses. No mastoid or middle ear effusion. The  orbits are normal. ASPECTS (Alberta Stroke Program Early CT Score) - Ganglionic level infarction (caudate, lentiform nuclei, internal capsule, insula, M1-M3 cortex): 7 - Supraganglionic infarction (M4-M6 cortex): 3 Total score (0-10 with 10 being normal): 10 CTA NECK FINDINGS SKELETON: There is no bony spinal canal stenosis. No lytic or blastic lesion. OTHER NECK: Normal pharynx, larynx and major salivary glands. No cervical lymphadenopathy. Unremarkable thyroid gland. UPPER CHEST: No pneumothorax or pleural effusion. No nodules or masses. AORTIC ARCH: There is calcific atherosclerosis of the aortic arch. There is no aneurysm, dissection or hemodynamically significant stenosis of the visualized portion of the aorta. Conventional 3 vessel aortic branching pattern. The visualized proximal subclavian arteries are widely patent. RIGHT CAROTID SYSTEM: No dissection, occlusion or aneurysm. Mild atherosclerotic calcification at the carotid bifurcation without hemodynamically significant stenosis. LEFT CAROTID SYSTEM: No dissection, occlusion or aneurysm. Mild atherosclerotic calcification at the carotid bifurcation without hemodynamically significant stenosis. VERTEBRAL ARTERIES: Left dominant configuration. Atherosclerotic narrowing of both vertebral artery origins. There is no dissection, occlusion or flow-limiting stenosis to the skull base (V1-V3 segments). CTA HEAD FINDINGS POSTERIOR CIRCULATION: --Vertebral arteries: Severe stenosis of the distal left V4 segment. Right V4 segment is normal. --Inferior cerebellar arteries: Normal. --Basilar artery: Normal. --Superior cerebellar arteries: Normal. --Posterior cerebral arteries (PCA): Normal. ANTERIOR CIRCULATION: --Intracranial internal carotid arteries: Atherosclerotic calcification of the internal carotid arteries at the skull base without hemodynamically significant stenosis. --Anterior cerebral arteries (ACA): Chronic occlusion of the right anterior cerebral artery  A2 segment. Atherosclerotic irregularity. --Middle cerebral arteries (MCA): Severe stenosis of the right M2 segment. Otherwise multifocal atherosclerotic irregularity. VENOUS SINUSES: As permitted by contrast timing, patent. ANATOMIC VARIANTS: None Review of the MIP images confirms the above findings. IMPRESSION: 1. No acute hemorrhage. ASPECTS is 10. These results were called by telephone at the time of interpretation on 12/12/2020 at 10:33 pm to provider [facility] , who verbally acknowledged these results. 2. No emergent large vessel occlusion. 3. Severe stenosis of the right MCA M2 segment. 4. Severe stenosis of the left vertebral artery V4 segment. 5. 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 Aortic Atherosclerosis (ICD10-I70.0). Electronically Signed   By: Deatra Robinson M.D.   On: 12/12/2020 22:44   CT ANGIO HEAD CODE STROKE  Result Date: 12/12/2020 CLINICAL DATA:  Neuro deficit, acute, stroke suspected; Stroke/TIA, assess  extracranial arteries; Stroke/TIA, assess intracranial arteries EXAM: CT HEAD WITHOUT CONTRAST CT ANGIOGRAPHY OF THE HEAD AND NECK TECHNIQUE: Contiguous axial images were obtained from the base of the skull through the vertex without intravenous contrast. Multidetector CT imaging of the head and neck was performed using the standard protocol during bolus administration of intravenous contrast. Multiplanar CT image reconstructions and MIPs were obtained to evaluate the vascular anatomy. Carotid stenosis measurements (when applicable) are obtained utilizing NASCET criteria, using the distal internal carotid diameter as the denominator. CONTRAST:  59mL OMNIPAQUE IOHEXOL 350 MG/ML SOLN COMPARISON:  None. FINDINGS: CT HEAD FINDINGS Brain: There is no mass, hemorrhage or extra-axial collection. There is generalized atrophy without lobar predilection. There is hypoattenuation of the periventricular white matter, most commonly indicating chronic ischemic  microangiopathy. There is an old right ACA territory infarct. Vascular: Atherosclerotic calcification. Skull: The visualized skull base, calvarium and extracranial soft tissues are normal. Sinuses/Orbits: No fluid levels or advanced mucosal thickening of the visualized paranasal sinuses. No mastoid or middle ear effusion. The orbits are normal. ASPECTS (Alberta Stroke Program Early CT Score) - Ganglionic level infarction (caudate, lentiform nuclei, internal capsule, insula, M1-M3 cortex): 7 - Supraganglionic infarction (M4-M6 cortex): 3 Total score (0-10 with 10 being normal): 10 CTA NECK FINDINGS SKELETON: There is no bony spinal canal stenosis. No lytic or blastic lesion. OTHER NECK: Normal pharynx, larynx and major salivary glands. No cervical lymphadenopathy. Unremarkable thyroid gland. UPPER CHEST: No pneumothorax or pleural effusion. No nodules or masses. AORTIC ARCH: There is calcific atherosclerosis of the aortic arch. There is no aneurysm, dissection or hemodynamically significant stenosis of the visualized portion of the aorta. Conventional 3 vessel aortic branching pattern. The visualized proximal subclavian arteries are widely patent. RIGHT CAROTID SYSTEM: No dissection, occlusion or aneurysm. Mild atherosclerotic calcification at the carotid bifurcation without hemodynamically significant stenosis. LEFT CAROTID SYSTEM: No dissection, occlusion or aneurysm. Mild atherosclerotic calcification at the carotid bifurcation without hemodynamically significant stenosis. VERTEBRAL ARTERIES: Left dominant configuration. Atherosclerotic narrowing of both vertebral artery origins. There is no dissection, occlusion or flow-limiting stenosis to the skull base (V1-V3 segments). CTA HEAD FINDINGS POSTERIOR CIRCULATION: --Vertebral arteries: Severe stenosis of the distal left V4 segment. Right V4 segment is normal. --Inferior cerebellar arteries: Normal. --Basilar artery: Normal. --Superior cerebellar arteries: Normal.  --Posterior cerebral arteries (PCA): Normal. ANTERIOR CIRCULATION: --Intracranial internal carotid arteries: Atherosclerotic calcification of the internal carotid arteries at the skull base without hemodynamically significant stenosis. 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 Atherosclerotic irregularity. --Middle cerebral arteries (MCA): Severe stenosis of the right M2 segment. Otherwise multifocal atherosclerotic irregularity. VENOUS SINUSES: As permitted by contrast timing, patent. ANATOMIC VARIANTS: None Review of the MIP images confirms the above findings. IMPRESSION: 1. No acute hemorrhage. ASPECTS is 10. These results were called by telephone at the time of interpretation on 12/12/2020 at 10:33 pm to provider [facility] , who verbally acknowledged these results. 2. No emergent large vessel occlusion. 3. Severe stenosis of the right MCA M2 segment. 4. Severe stenosis of the left vertebral artery V4 segment. 5. 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 Aortic Atherosclerosis (ICD10-I70.0). Electronically Signed   By: Deatra Robinson M.D.   On: 12/12/2020 22:44   CT ANGIO NECK CODE STROKE  Result Date: 12/12/2020 CLINICAL DATA:  Neuro deficit, acute, stroke suspected; Stroke/TIA, assess extracranial arteries; Stroke/TIA, assess intracranial arteries EXAM: CT HEAD WITHOUT CONTRAST CT ANGIOGRAPHY OF THE HEAD AND NECK TECHNIQUE: Contiguous axial images were  obtained from the base of the skull through the vertex without intravenous contrast. Multidetector CT imaging of the head and neck was performed using the standard protocol during bolus administration of intravenous contrast. Multiplanar CT image reconstructions and MIPs were obtained to evaluate the vascular anatomy. Carotid stenosis measurements (when applicable) are obtained utilizing NASCET criteria, using the distal internal carotid diameter as the denominator.  CONTRAST:  50mL OMNIPAQUE IOHEXOL 350 MG/ML SOLN COMPARISON:  None. FINDINGS: CT HEAD FINDINGS Brain: There is no mass, hemorrhage or extra-axial collection. There is generalized atrophy without lobar predilection. There is hypoattenuation of the periventricular white matter, most commonly indicating chronic ischemic microangiopathy. There is an old right ACA territory infarct. Vascular: Atherosclerotic calcification. Skull: The visualized skull base, calvarium and extracranial soft tissues are normal. Sinuses/Orbits: No fluid levels or advanced mucosal thickening of the visualized paranasal sinuses. No mastoid or middle ear effusion. The orbits are normal. ASPECTS (Alberta Stroke Program Early CT Score) - Ganglionic level infarction (caudate, lentiform nuclei, internal capsule, insula, M1-M3 cortex): 7 - Supraganglionic infarction (M4-M6 cortex): 3 Total score (0-10 with 10 being normal): 10 CTA NECK FINDINGS SKELETON: There is no bony spinal canal stenosis. No lytic or blastic lesion. OTHER NECK: Normal pharynx, larynx and major salivary glands. No cervical lymphadenopathy. Unremarkable thyroid gland. UPPER CHEST: No pneumothorax or pleural effusion. No nodules or masses. AORTIC ARCH: There is calcific atherosclerosis of the aortic arch. There is no aneurysm, dissection or hemodynamically significant stenosis of the visualized portion of the aorta. Conventional 3 vessel aortic branching pattern. The visualized proximal subclavian arteries are widely patent. RIGHT CAROTID SYSTEM: No dissection, occlusion or aneurysm. Mild atherosclerotic calcification at the carotid bifurcation without hemodynamically significant stenosis. LEFT CAROTID SYSTEM: No dissection, occlusion or aneurysm. Mild atherosclerotic calcification at the carotid bifurcation without hemodynamically significant stenosis. VERTEBRAL ARTERIES: Left dominant configuration. Atherosclerotic narrowing of both vertebral artery origins. There is no  dissection, occlusion or flow-limiting stenosis to the skull base (V1-V3 segments). CTA HEAD FINDINGS POSTERIOR CIRCULATION: --Vertebral arteries: Severe stenosis of the distal left V4 segment. Right V4 segment is normal. --Inferior cerebellar arteries: Normal. --Basilar artery: Normal. --Superior cerebellar arteries: Normal. --Posterior cerebral arteries (PCA): Normal. ANTERIOR CIRCULATION: --Intracranial internal carotid arteries: Atherosclerotic calcification of the internal carotid arteries at the skull base without hemodynamically significant stenosis. 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 Atherosclerotic irregularity. --Middle cerebral arteries (MCA): Severe stenosis of the right M2 segment. Otherwise multifocal atherosclerotic irregularity. VENOUS SINUSES: As permitted by contrast timing, patent. ANATOMIC VARIANTS: None Review of the MIP images confirms the above findings. IMPRESSION: 1. No acute hemorrhage. ASPECTS is 10. These results were called by telephone at the time of interpretation on 12/12/2020 at 10:33 pm to provider [facility] , who verbally acknowledged these results. 2. No emergent large vessel occlusion. 3. Severe stenosis of the right MCA M2 segment. 4. Severe stenosis of the left vertebral artery V4 segment. 5. 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 Aortic Atherosclerosis (ICD10-I70.0). Electronically Signed   By: Deatra Robinson M.D.   On: 12/12/2020 22:44    Procedures Procedures   Medications Ordered in ED Medications  sodium chloride flush (NS) 0.9 % injection 3 mL (3 mLs Intravenous Given 12/12/20 2239)  levETIRAcetam (KEPPRA) IVPB 1500 mg/ 100 mL premix (0 mg Intravenous Stopped 12/12/20 2252)  iohexol (OMNIPAQUE) 350 MG/ML injection 75 mL (75 mLs Intravenous Contrast Given 12/12/20 2227)  LORazepam (ATIVAN) injection 1 mg (1 mg Intravenous  Given 12/12/20 2237)    ED Course  I  have reviewed the triage vital signs and the nursing notes.  Pertinent labs & imaging results that were available during my care of the patient were reviewed by me and considered in my medical decision making (see chart for details).    MDM Rules/Calculators/A&P                           Patient is a 87 year old male who presents as a code stroke.  He has some increased spasticity and contracture of the left side which is new per report.  He is noncommunicative.  He does have some response to noxious stimuli and is controlling his airway at this point.  His nursing home paperwork indicates that he is DNR on his CODE STATUS.  He had a CT scan of his head as well as CTA of his head and neck.  There is no evidence of intracranial hemorrhage.  No evidence of large vessel occlusion.  Dr. Derry Lory with neurology has evaluated the patient and feels that patient potentially is having seizure activity.  He has been loaded with Keppra.  He was given a milligram Ativan.  Continuous EEG monitoring has been ordered.  His electrolytes are nonconcerning.  His hemoglobin is normal.  Will admit to the hospitalist service for further treatment.  I spoke with Dr. Loney Loh who will admit the pt.  CRITICAL CARE Performed by: Rolan Bucco Total critical care time: 60 minutes Critical care time was exclusive of separately billable procedures and treating other patients. Critical care was necessary to treat or prevent imminent or life-threatening deterioration. Critical care was time spent personally by me on the following activities: development of treatment plan with patient and/or surrogate as well as nursing, discussions with consultants, evaluation of patient's response to treatment, examination of patient, obtaining history from patient or surrogate, ordering and performing treatments and interventions, ordering and review of laboratory studies, ordering and review of radiographic studies, pulse oximetry and  re-evaluation of patient's condition.  Final Clinical Impression(s) / ED Diagnoses Final diagnoses:  Status epilepticus [facility])    Rx / DC Orders ED Discharge Orders     None        Rolan Bucco, MD 12/12/20 2259

## 2020-12-13 ENCOUNTER — Inpatient Hospital Stay (HOSPITAL_COMMUNITY): Payer: Medicare Other

## 2020-12-13 DIAGNOSIS — G309 Alzheimer's disease, unspecified: Secondary | ICD-10-CM | POA: Diagnosis present

## 2020-12-13 DIAGNOSIS — Z79899 Other long term (current) drug therapy: Secondary | ICD-10-CM | POA: Diagnosis not present

## 2020-12-13 DIAGNOSIS — Z8673 Personal history of transient ischemic attack (TIA), and cerebral infarction without residual deficits: Secondary | ICD-10-CM | POA: Diagnosis not present

## 2020-12-13 DIAGNOSIS — E119 Type 2 diabetes mellitus without complications: Secondary | ICD-10-CM | POA: Diagnosis present

## 2020-12-13 DIAGNOSIS — R569 Unspecified convulsions: Secondary | ICD-10-CM

## 2020-12-13 DIAGNOSIS — R29810 Facial weakness: Secondary | ICD-10-CM | POA: Diagnosis present

## 2020-12-13 DIAGNOSIS — F0630 Mood disorder due to known physiological condition, unspecified: Secondary | ICD-10-CM | POA: Diagnosis present

## 2020-12-13 DIAGNOSIS — I6502 Occlusion and stenosis of left vertebral artery: Secondary | ICD-10-CM | POA: Diagnosis present

## 2020-12-13 DIAGNOSIS — F02818 Dementia in other diseases classified elsewhere, unspecified severity, with other behavioral disturbance: Secondary | ICD-10-CM | POA: Diagnosis present

## 2020-12-13 DIAGNOSIS — Z66 Do not resuscitate: Secondary | ICD-10-CM | POA: Diagnosis present

## 2020-12-13 DIAGNOSIS — G8194 Hemiplegia, unspecified affecting left nondominant side: Secondary | ICD-10-CM | POA: Diagnosis present

## 2020-12-13 DIAGNOSIS — U071 COVID-19: Secondary | ICD-10-CM | POA: Diagnosis not present

## 2020-12-13 DIAGNOSIS — Z7901 Long term (current) use of anticoagulants: Secondary | ICD-10-CM | POA: Diagnosis not present

## 2020-12-13 DIAGNOSIS — N400 Enlarged prostate without lower urinary tract symptoms: Secondary | ICD-10-CM | POA: Diagnosis present

## 2020-12-13 DIAGNOSIS — Z7984 Long term (current) use of oral hypoglycemic drugs: Secondary | ICD-10-CM | POA: Diagnosis not present

## 2020-12-13 DIAGNOSIS — Z9181 History of falling: Secondary | ICD-10-CM | POA: Diagnosis not present

## 2020-12-13 DIAGNOSIS — I6601 Occlusion and stenosis of right middle cerebral artery: Secondary | ICD-10-CM | POA: Diagnosis present

## 2020-12-13 DIAGNOSIS — I480 Paroxysmal atrial fibrillation: Secondary | ICD-10-CM | POA: Diagnosis present

## 2020-12-13 DIAGNOSIS — G40901 Epilepsy, unspecified, not intractable, with status epilepticus: Secondary | ICD-10-CM | POA: Diagnosis present

## 2020-12-13 DIAGNOSIS — I10 Essential (primary) hypertension: Secondary | ICD-10-CM | POA: Diagnosis present

## 2020-12-13 LAB — URINALYSIS, ROUTINE W REFLEX MICROSCOPIC
Bilirubin Urine: NEGATIVE
Glucose, UA: 50 mg/dL — AB
Hgb urine dipstick: NEGATIVE
Ketones, ur: NEGATIVE mg/dL
Leukocytes,Ua: NEGATIVE
Nitrite: NEGATIVE
Protein, ur: NEGATIVE mg/dL
Specific Gravity, Urine: 1.018 (ref 1.005–1.030)
pH: 5 (ref 5.0–8.0)

## 2020-12-13 LAB — AMMONIA: Ammonia: 28 umol/L (ref 9–35)

## 2020-12-13 LAB — RESP PANEL BY RT-PCR (FLU A&B, COVID) ARPGX2
Influenza A by PCR: NEGATIVE
Influenza B by PCR: NEGATIVE
SARS Coronavirus 2 by RT PCR: NEGATIVE

## 2020-12-13 LAB — CBG MONITORING, ED
Glucose-Capillary: 174 mg/dL — ABNORMAL HIGH (ref 70–99)
Glucose-Capillary: 159 mg/dL — ABNORMAL HIGH (ref 70–99)
Glucose-Capillary: 134 mg/dL — ABNORMAL HIGH (ref 70–99)
Glucose-Capillary: 145 mg/dL — ABNORMAL HIGH (ref 70–99)
Glucose-Capillary: 148 mg/dL — ABNORMAL HIGH (ref 70–99)

## 2020-12-13 LAB — TSH: TSH: 2.431 u[IU]/mL (ref 0.350–4.500)

## 2020-12-13 LAB — CK: Total CK: 88 U/L (ref 49–397)

## 2020-12-13 LAB — HEMOGLOBIN A1C
Hgb A1c MFr Bld: 6.9 % — ABNORMAL HIGH (ref 4.8–5.6)
Mean Plasma Glucose: 151.33 mg/dL

## 2020-12-13 LAB — GLUCOSE, CAPILLARY: Glucose-Capillary: 147 mg/dL — ABNORMAL HIGH (ref 70–99)

## 2020-12-13 IMAGING — MR MR HEAD W/O CM
12 of 13 series · 44 of 48 positions shown · non-contrast
Comparison: Head CT [DATE]

CLINICAL DATA: Neuro deficit, acute, stroke suspected. History of
stroke, dementia, and diabetes.

EXAM:
MRI HEAD WITHOUT CONTRAST
TECHNIQUE: Multiplanar, multiecho pulse sequences of the brain and surrounding
structures were obtained without intravenous contrast.

[Series 5: DWI · axial · 3.0mm · 0.88mm/px · z∈[-45,+101]mm · 8 of 100 slices shown (1 of 4)]
[im 1/100]
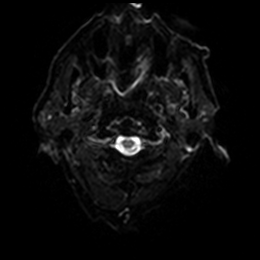
[im 15/100]
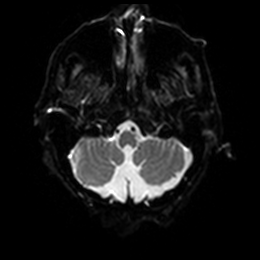
[im 29/100]
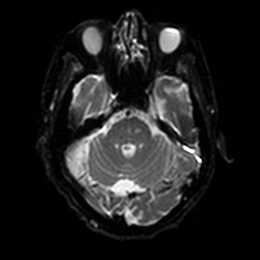
[im 43/100]
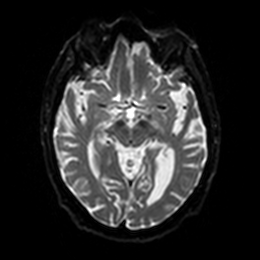
[im 57/100]
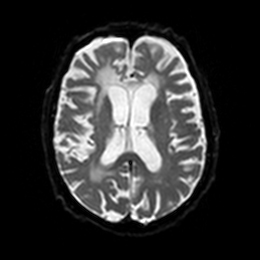
[im 71/100]
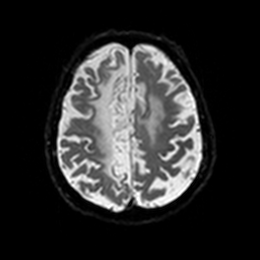
[im 85/100]
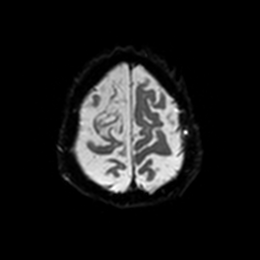
[im 100/100]
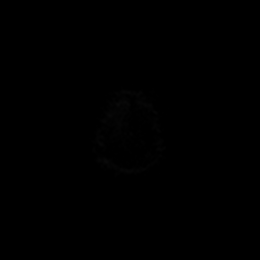

[Series 6: DWI · axial · 3.0mm · 0.88mm/px · z∈[-45,+101]mm · 4 of 50 slices shown (2 of 4)]
[im 1/50]
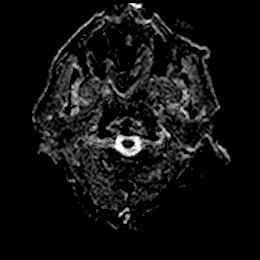
[im 17/50]
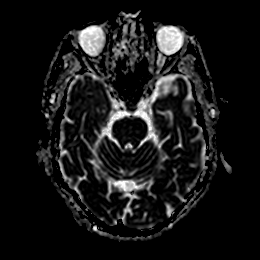
[im 33/50]
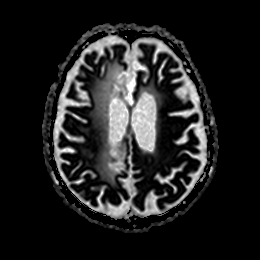
[im 50/50]
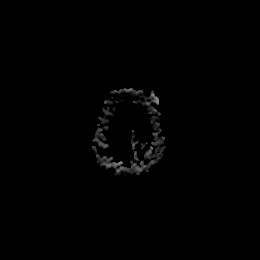

[Series 7: DWI · coronal · 4.0mm · 0.88mm/px · 5 of 68 slices shown (3 of 4)]
[im 1/68]
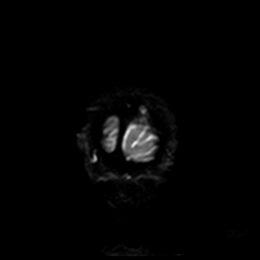
[im 17/68]
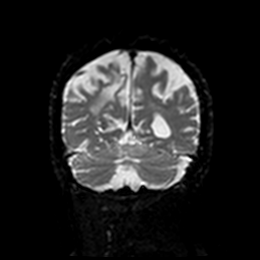
[im 34/68]
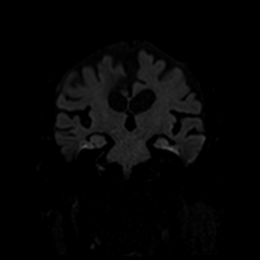
[im 51/68]
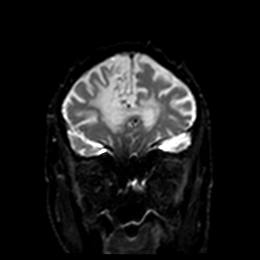
[im 68/68]
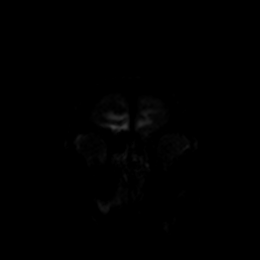

[Series 8: DWI · coronal · 4.0mm · 0.88mm/px · 3 of 34 slices shown (4 of 4)]
[im 1/34]
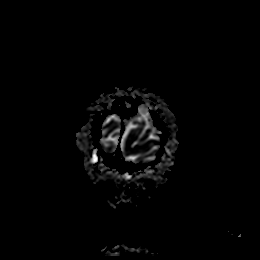
[im 17/34]
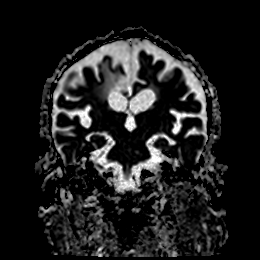
[im 34/34]
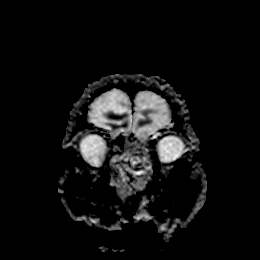

[Series 9: T1 · sagittal · 5.0mm · 0.75mm/px · 2 of 24 slices shown]
[im 1/24]
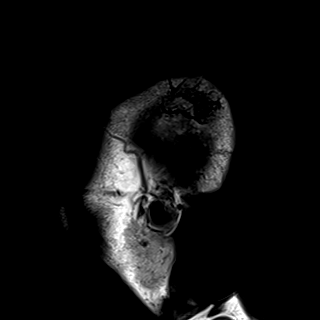
[im 24/24]
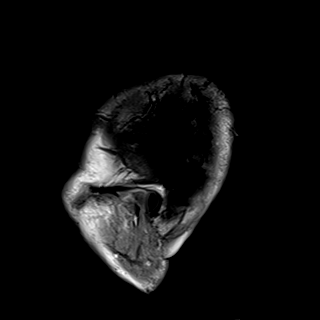

[Series 10: T2 · axial · 5.0mm · 0.72mm/px · z∈[-46,+104]mm · 2 of 26 slices shown (1 of 2)]
[im 1/26]
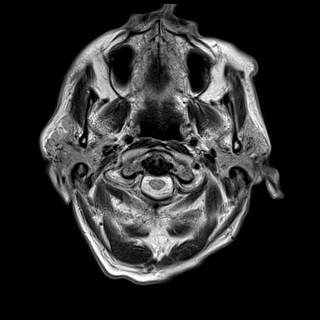
[im 26/26]
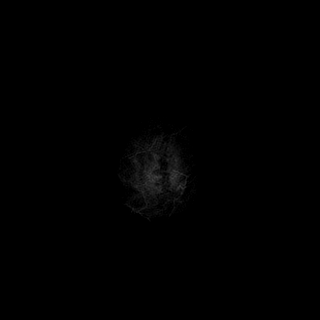

[Series 11: FLAIR · axial · 5.0mm · 0.45mm/px · z∈[-48,+102]mm · 2 of 26 slices shown]
[im 1/26]
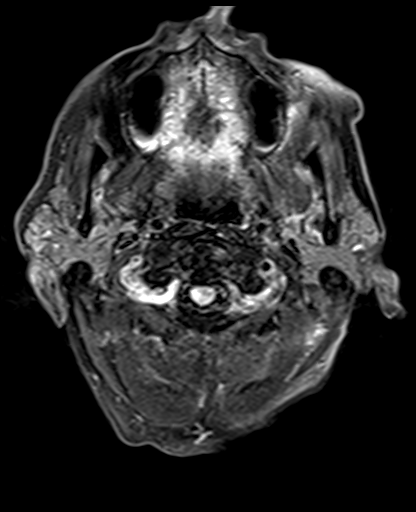
[im 26/26]
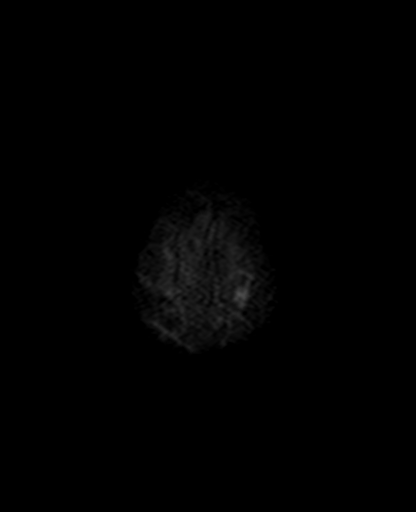

[Series 12: mag_images · axial · 3.0mm · 0.90mm/px · z∈[-53,+99]mm · 4 of 52 slices shown]
[im 1/52]
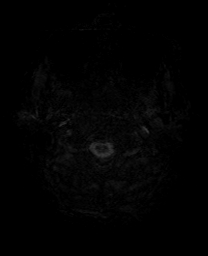
[im 18/52]
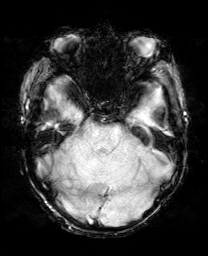
[im 35/52]
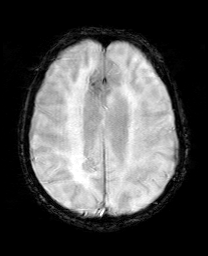
[im 52/52]
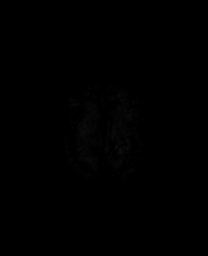

[Series 13: pha_images · axial · 3.0mm · 0.90mm/px · z∈[-53,+99]mm · 4 of 52 slices shown]
[im 1/52]
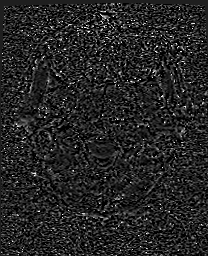
[im 18/52]
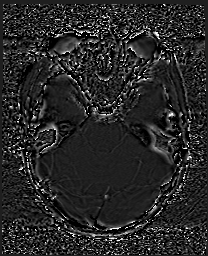
[im 35/52]
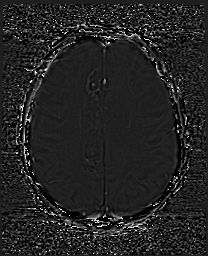
[im 52/52]
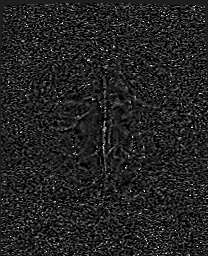

[Series 14: swi_images · axial · 3.0mm · 0.90mm/px · z∈[-53,+99]mm · 4 of 52 slices shown]
[im 1/52]
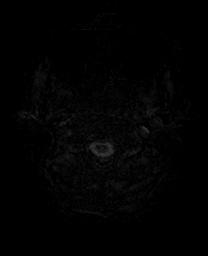
[im 18/52]
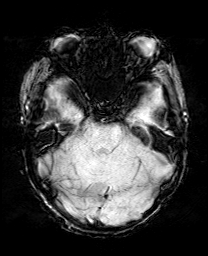
[im 35/52]
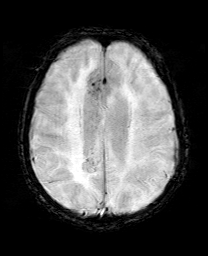
[im 52/52]
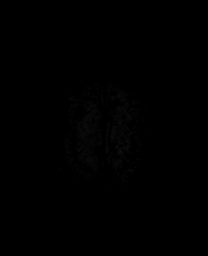

[Series 15: mip_images(sw) · axial · 24.0mm · 0.90mm/px · z∈[-43,+89]mm · 4 of 45 slices shown]
[im 1/45]
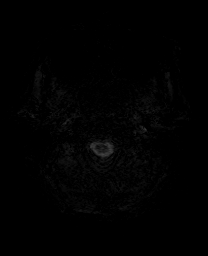
[im 15/45]
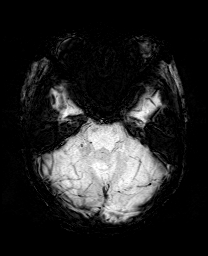
[im 30/45]
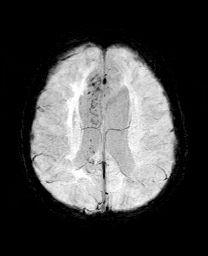
[im 45/45]
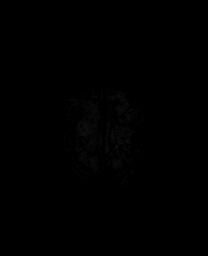

[Series 17: T2 · coronal · 5.0mm · 0.34mm/px · 2 of 30 slices shown (2 of 2)]
[im 1/30]
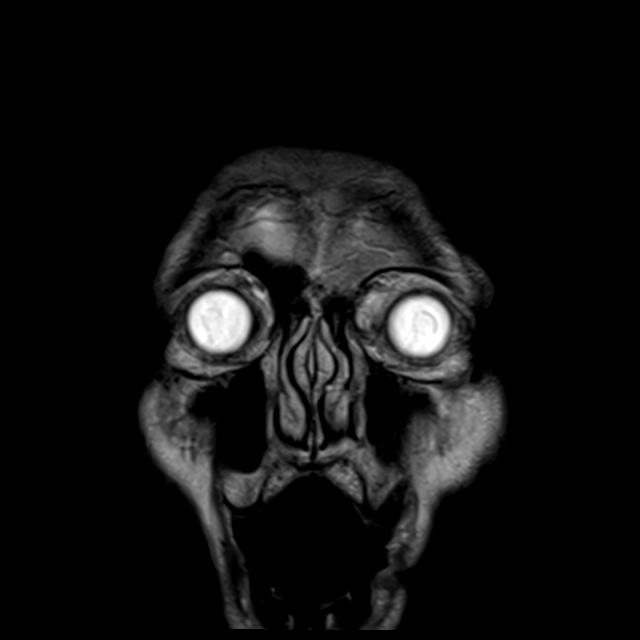
[im 30/30]
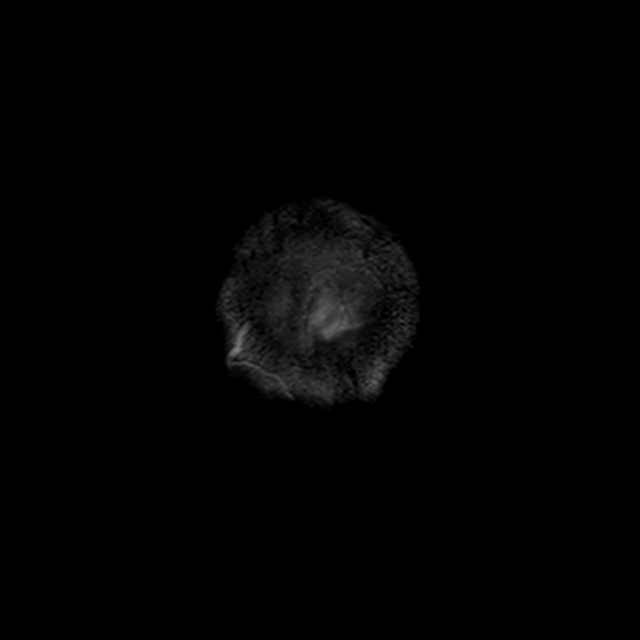

[44 of 48 positions shown; findings below may reference images not displayed]

FINDINGS: Brain: There is no evidence of an acute infarct, mass, midline
shift, or extra-axial fluid collection. There is a large chronic
right ACA infarct with associated chronic blood products. T2
hyperintensities elsewhere in the cerebral white matter bilaterally
are nonspecific but compatible with moderate chronic small vessel
ischemic disease. Mild chronic small vessel changes are also noted
in the pons. There is a chronic microhemorrhage in the mesial right
temporal lobe. There is moderate cerebral atrophy.

Vascular: Major intracranial vascular flow voids are preserved.

Skull and upper cervical spine: Unremarkable bone marrow signal para

Sinuses/Orbits: Bilateral cataract extraction. Tiny mucous retention
cyst in the right maxillary sinus. Small right mastoid effusion.

Other: None.
IMPRESSION: 1. No acute intracranial abnormality.
2. Large chronic right ACA infarct.
3. Moderate chronic small vessel ischemic disease and cerebral
atrophy.

## 2020-12-13 MED ORDER — INSULIN ASPART 100 UNIT/ML IJ SOLN
0.0000 [IU] | INTRAMUSCULAR | Status: DC
  Administered 2020-12-13 – 2020-12-17 (×7): 1 [IU] via SUBCUTANEOUS
  Administered 2020-12-18: 2 [IU] via SUBCUTANEOUS
  Administered 2020-12-19 – 2020-12-20 (×3): 1 [IU] via SUBCUTANEOUS
  Administered 2020-12-20 – 2020-12-22 (×4): 2 [IU] via SUBCUTANEOUS
  Administered 2020-12-23: 3 [IU] via SUBCUTANEOUS
  Administered 2020-12-23: 2 [IU] via SUBCUTANEOUS
  Administered 2020-12-24 – 2020-12-25 (×4): 1 [IU] via SUBCUTANEOUS
  Administered 2020-12-25: 3 [IU] via SUBCUTANEOUS
  Administered 2020-12-26 (×2): 2 [IU] via SUBCUTANEOUS
  Administered 2020-12-26: 1 [IU] via SUBCUTANEOUS
  Administered 2020-12-27 (×2): 2 [IU] via SUBCUTANEOUS
  Administered 2020-12-27: 1 [IU] via SUBCUTANEOUS
  Administered 2020-12-27: 2 [IU] via SUBCUTANEOUS
  Administered 2020-12-27 – 2020-12-28 (×5): 1 [IU] via SUBCUTANEOUS
  Administered 2020-12-28: 2 [IU] via SUBCUTANEOUS
  Administered 2020-12-29 (×3): 1 [IU] via SUBCUTANEOUS

## 2020-12-13 MED ORDER — IPRATROPIUM-ALBUTEROL 0.5-2.5 (3) MG/3ML IN SOLN: 3.0000 mL | RESPIRATORY_TRACT | Status: DC | PRN

## 2020-12-13 MED ORDER — HYDRALAZINE HCL 20 MG/ML IJ SOLN
5.0000 mg | INTRAMUSCULAR | Status: DC | PRN
  Administered 2020-12-13: 5 mg via INTRAVENOUS
  Filled 2020-12-13: qty 1

## 2020-12-13 MED ORDER — DEXTROSE-NACL 5-0.45 % IV SOLN: INTRAVENOUS | Status: AC

## 2020-12-13 MED ORDER — SENNOSIDES-DOCUSATE SODIUM 8.6-50 MG PO TABS: 1.0000 | ORAL_TABLET | Freq: Every evening | ORAL | Status: DC | PRN

## 2020-12-13 MED ORDER — ACETAMINOPHEN 325 MG PO TABS
650.0000 mg | ORAL_TABLET | Freq: Four times a day (QID) | ORAL | Status: DC | PRN
  Administered 2020-12-16 – 2020-12-24 (×2): 650 mg via ORAL
  Filled 2020-12-13 (×3): qty 2

## 2020-12-13 MED ORDER — HYDRALAZINE HCL 20 MG/ML IJ SOLN: 10.0000 mg | INTRAMUSCULAR | Status: DC | PRN

## 2020-12-13 MED ORDER — METOPROLOL TARTRATE 5 MG/5ML IV SOLN: 5.0000 mg | INTRAVENOUS | Status: DC | PRN

## 2020-12-13 MED ORDER — SODIUM CHLORIDE 0.9 % IV SOLN: INTRAVENOUS | Status: DC

## 2020-12-13 MED ORDER — ACETAMINOPHEN 650 MG RE SUPP: 650.0000 mg | Freq: Four times a day (QID) | RECTAL | Status: DC | PRN

## 2020-12-13 MED ORDER — CHLORHEXIDINE GLUCONATE CLOTH 2 % EX PADS
6.0000 | MEDICATED_PAD | Freq: Every day | CUTANEOUS | Status: DC
  Administered 2020-12-14 – 2020-12-22 (×6): 6 via TOPICAL

## 2020-12-13 MED ORDER — TRAZODONE HCL 50 MG PO TABS
50.0000 mg | ORAL_TABLET | Freq: Every evening | ORAL | Status: DC | PRN
  Administered 2020-12-23 – 2020-12-28 (×4): 50 mg via ORAL
  Filled 2020-12-13 (×4): qty 1

## 2020-12-13 NOTE — ED Notes (Signed)
Pt cleaned and changed linens. External Catheter placed.

## 2020-12-13 NOTE — ED Notes (Signed)
RN transported pt to MRI ?

## 2020-12-13 NOTE — Progress Notes (Signed)
, PROGRESS NOTE    Jeffery Haynes  UMP:[id number] DOB: Dec 14, 1931 DOA: 12/12/2020 PCP: Eloisa Northern, MD   Brief Narrative:  87 year old with history of Alzheimer's dementia, paroxysmal A. fib, DM2 CVA, seizures, HTN, BPH comes to the hospital for left upper extremity contraction and somnolence.  He was last seen normal 10 AM on the day of admission.  CT of the head, CTA head and neck was unremarkable.  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  Neurology was consulted, he was started on Keppra and Vimpat.  EEG was ordered.   Assessment & Plan:   Principal Problem:   Seizure (HCC) Active Problems:   Alzheimer's dementia (HCC)   Essential hypertension   Diabetes mellitus type 2 in nonobese [facility])  Seizure with concerns of postictal state Altered mental status -Used to be on Vimpat outpatient which has been discontinued.  CT of the head, CTA head and neck negative for acute findings but does show severe stenosis of MCA region with other areas of chronic stenosis and occlusion. Other differential Encephalitis, underlying infection, Status epileptics.  - EEG-pending. Needs LP? And MRI Brain?  - IV Keppra and Vimpat started.  Ativan IV as needed - Neurology is following.  -Check TSH, ammonia, UA. Insert Foley. IVF   Essential hypertension -Currently patient is on IV hydralazine as needed - On hold-Norvasc   Paroxysmal atrial fibrillation -Patient is no longer on anticoagulation which was discontinued outpatient by PCP - IV Lopressor as needed  Non-insulin-dependent type II diabetes -A1c-6.9.  Metoprolol on hold - Sliding scale and Accu-Cheks   Alzheimer's dementia Mood disorder BPH -Unable to take any home medications-donepezil, Celexa       DVT prophylaxis: SCDs Start: 12/13/20 0016 Code Status: DNR Family Communication:    Status is: Inpatient  Remains inpatient appropriate because:Inpatient level of care  appropriate due to severity of illness  Dispo: The patient is from: Home              Anticipated d/c is to:  TBD              Patient currently is not medically stable to d/c.   Difficult to place patient No       Nutritional status           There is no height or weight on file to calculate BMI.           Subjective: Laying on the strecher with stable vitals but somnolent/Drowsy. Unable to obtain any information.    Examination:  General exam: somnolent.  Respiratory system: Clear to auscultation. Respiratory effort normal. Cardiovascular system: S1 & S2 heard, RRR. No JVD, murmurs, rubs, gallops or clicks. No pedal edema. Gastrointestinal system: Abdomen is nondistended, soft and  No organomegaly or masses felt. Normal bowel sounds heard. Central nervous system: Unable to assess.  Extremities: No edema Skin: No rashes, lesions or ulcers Psychiatry: Difficult to assess.    Objective: Vitals:   12/13/20 0600 12/13/20 0630 12/13/20 0645 12/13/20 0715  BP: (!) 141/70 (!) 148/70 135/73 125/64  Pulse: 93 90 90 96  Resp: 14 16 14 17   Temp:      TempSrc:      SpO2: 94% 93% 94% 93%   No intake or output data in the 24 hours ending 12/13/20 0800 There were no vitals filed for this visit.   Data Reviewed:   CBC: Recent Labs  Lab 12/12/20 2211 12/12/20 2257  WBC  --  7.6  NEUTROABS  --  5.4  HGB 13.9 11.3*  HCT 41.0 35.6*  MCV  --  93.2  PLT  --  263   Basic Metabolic Panel: Recent Labs  Lab 12/12/20 2211 12/12/20 2257  NA 135 134*  K 4.9 4.6  CL 102 101  CO2  --  25  GLUCOSE 168* 156*  BUN 30* 25*  CREATININE 0.90 0.93  CALCIUM  --  9.2   GFR: CrCl cannot be calculated (Unknown ideal weight.). Liver Function Tests: Recent Labs  Lab 12/12/20 2257  AST 11*  ALT 11  ALKPHOS 78  BILITOT 0.4  PROT 6.1*  ALBUMIN 3.4*   No results for input(s): LIPASE, AMYLASE in the last 168 hours. No results for input(s): AMMONIA in the last 168  hours. Coagulation Profile: Recent Labs  Lab 12/12/20 2257  INR 1.0   Cardiac Enzymes: No results for input(s): CKTOTAL, CKMB, CKMBINDEX, TROPONINI in the last 168 hours. BNP (last 3 results) No results for input(s): PROBNP in the last 8760 hours. HbA1C: Recent Labs    12/13/20 0218  HGBA1C 6.9*   CBG: Recent Labs  Lab 12/12/20 2206 12/13/20 0219 12/13/20 0725  GLUCAP 151* 174* 159*   Lipid Profile: No results for input(s): CHOL, HDL, LDLCALC, TRIG, CHOLHDL, LDLDIRECT in the last 72 hours. Thyroid Function Tests: No results for input(s): TSH, T4TOTAL, FREET4, T3FREE, THYROIDAB in the last 72 hours. Anemia Panel: No results for input(s): VITAMINB12, FOLATE, FERRITIN, TIBC, IRON, RETICCTPCT in the last 72 hours. Sepsis Labs: No results for input(s): PROCALCITON, LATICACIDVEN in the last 168 hours.  Recent Results (from the past 240 hour(s))  Resp Panel by RT-PCR (Flu A&B, Covid) Nasopharyngeal Swab     Status: None   Collection Time: 12/12/20 10:57 PM   Specimen: Nasopharyngeal Swab; Nasopharyngeal(NP) swabs in vial transport medium  Result Value Ref Range Status   SARS Coronavirus 2 by RT PCR NEGATIVE NEGATIVE Final    Comment: (NOTE) SARS-CoV-2 target nucleic acids are NOT DETECTED.  The SARS-CoV-2 RNA is generally detectable in upper respiratory specimens during the acute phase of infection. The lowest concentration of SARS-CoV-2 viral copies this assay can detect is 138 copies/mL. A negative result does not preclude SARS-Cov-2 infection and should not be used as the sole basis for treatment or other patient management decisions. A negative result may occur with  improper specimen collection/handling, submission of specimen other than nasopharyngeal swab, presence of viral mutation(s) within the areas targeted by this assay, and inadequate number of viral copies(<138 copies/mL). A negative result must be combined with clinical observations, patient history, and  epidemiological information. The expected result is Negative.  Fact Sheet for Patients:  BloggerCourse.com  Fact Sheet for Healthcare Providers:  SeriousBroker.it  This test is no t yet approved or cleared by the [place] FDA and  has been authorized for detection and/or diagnosis of SARS-CoV-2 by FDA under an Emergency Use Authorization (EUA). This EUA will remain  in effect (meaning this test can be used) for the duration of the COVID-19 declaration under Section 564(b)(1) of the Act, 21 U.S.C.section 360bbb-3(b)(1), unless the authorization is terminated  or revoked sooner.       Influenza A by PCR NEGATIVE NEGATIVE Final   Influenza B by PCR NEGATIVE NEGATIVE Final    Comment: (NOTE) The Xpert Xpress SARS-CoV-2/FLU/RSV plus assay is intended as an aid in the diagnosis of influenza from Nasopharyngeal swab specimens and should not be used as a  sole basis for treatment. Nasal washings and aspirates are unacceptable for Xpert Xpress SARS-CoV-2/FLU/RSV testing.  Fact Sheet for Patients: BloggerCourse.com  Fact Sheet for Healthcare Providers: SeriousBroker.it  This test is not yet approved or cleared by the [place] FDA and has been authorized for detection and/or diagnosis of SARS-CoV-2 by FDA under an Emergency Use Authorization (EUA). This EUA will remain in effect (meaning this test can be used) for the duration of the COVID-19 declaration under Section 564(b)(1) of the Act, 21 U.S.C. section 360bbb-3(b)(1), unless the authorization is terminated or revoked.  Performed at [facility], [address], [place], [place] [address]          Radiology Studies: CT HEAD CODE STROKE WO CONTRAST  Result Date: 12/12/2020 CLINICAL DATA:  Neuro deficit, acute, stroke suspected; Stroke/TIA, assess extracranial arteries; Stroke/TIA, assess intracranial arteries  EXAM: CT HEAD WITHOUT CONTRAST CT ANGIOGRAPHY OF THE HEAD AND NECK TECHNIQUE: Contiguous axial images were obtained from the base of the skull through the vertex without intravenous contrast. Multidetector CT imaging of the head and neck was performed using the standard protocol during bolus administration of intravenous contrast. Multiplanar CT image reconstructions and MIPs were obtained to evaluate the vascular anatomy. Carotid stenosis measurements (when applicable) are obtained utilizing NASCET criteria, using the distal internal carotid diameter as the denominator. CONTRAST:  79mL OMNIPAQUE IOHEXOL 350 MG/ML SOLN COMPARISON:  None. FINDINGS: CT HEAD FINDINGS Brain: There is no mass, hemorrhage or extra-axial collection. There is generalized atrophy without lobar predilection. There is hypoattenuation of the periventricular white matter, most commonly indicating chronic ischemic microangiopathy. There is an old right ACA territory infarct. Vascular: Atherosclerotic calcification. Skull: The visualized skull base, calvarium and extracranial soft tissues are normal. Sinuses/Orbits: No fluid levels or advanced mucosal thickening of the visualized paranasal sinuses. No mastoid or middle ear effusion. The orbits are normal. ASPECTS ([place]ta Stroke Program Early CT Score) - Ganglionic level infarction (caudate, lentiform nuclei, internal capsule, insula, M1-M3 cortex): 7 - Supraganglionic infarction (M4-M6 cortex): 3 Total score (0-10 with 10 being normal): 10 CTA NECK FINDINGS SKELETON: There is no bony spinal canal stenosis. No lytic or blastic lesion. OTHER NECK: Normal pharynx, larynx and major salivary glands. No cervical lymphadenopathy. Unremarkable thyroid gland. UPPER CHEST: No pneumothorax or pleural effusion. No nodules or masses. AORTIC ARCH: There is calcific atherosclerosis of the aortic arch. There is no aneurysm, dissection or hemodynamically significant stenosis of the visualized portion of the  aorta. Conventional 3 vessel aortic branching pattern. The visualized proximal subclavian arteries are widely patent. RIGHT CAROTID SYSTEM: No dissection, occlusion or aneurysm. Mild atherosclerotic calcification at the carotid bifurcation without hemodynamically significant stenosis. LEFT CAROTID SYSTEM: No dissection, occlusion or aneurysm. Mild atherosclerotic calcification at the carotid bifurcation without hemodynamically significant stenosis. VERTEBRAL ARTERIES: Left dominant configuration. Atherosclerotic narrowing of both vertebral artery origins. There is no dissection, occlusion or flow-limiting stenosis to the skull base (V1-V3 segments). CTA HEAD FINDINGS POSTERIOR CIRCULATION: --Vertebral arteries: Severe stenosis of the distal left V4 segment. Right V4 segment is normal. --Inferior cerebellar arteries: Normal. --Basilar artery: Normal. --Superior cerebellar arteries: Normal. --Posterior cerebral arteries (PCA): Normal. ANTERIOR CIRCULATION: --Intracranial internal carotid arteries: Atherosclerotic calcification of the internal carotid arteries at the skull base without hemodynamically significant stenosis. 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 Atherosclerotic irregularity. --Middle cerebral arteries (MCA): Severe stenosis of the right M2 segment. Otherwise multifocal atherosclerotic irregularity. VENOUS SINUSES: As permitted by contrast timing, patent. ANATOMIC VARIANTS:  None Review of the MIP images confirms the above findings. IMPRESSION: 1. No acute hemorrhage. ASPECTS is 10. These results were called by telephone at the time of interpretation on 12/12/2020 at 10:33 pm to provider [facility] , who verbally acknowledged these results. 2. No emergent large vessel occlusion. 3. Severe stenosis of the right MCA M2 segment. 4. Severe stenosis of the left vertebral artery V4 segment. 5. Chronic occlusion of the right anterior cerebral  artery A2 segment. Aortic Atherosclerosis (ICD10-I70.0). Electronically Signed   By: Deatra Robinson M.D.   On: 12/12/2020 22:44   CT ANGIO HEAD CODE STROKE  Result Date: 12/12/2020 CLINICAL DATA:  Neuro deficit, acute, stroke suspected; Stroke/TIA, assess extracranial arteries; Stroke/TIA, assess intracranial arteries EXAM: CT HEAD WITHOUT CONTRAST CT ANGIOGRAPHY OF THE HEAD AND NECK TECHNIQUE: Contiguous axial images were obtained from the base of the skull through the vertex without intravenous contrast. Multidetector CT imaging of the head and neck was performed using the standard protocol during bolus administration of intravenous contrast. Multiplanar CT image reconstructions and MIPs were obtained to evaluate the vascular anatomy. Carotid stenosis measurements (when applicable) are obtained utilizing NASCET criteria, using the distal internal carotid diameter as the denominator. CONTRAST:  50mL OMNIPAQUE IOHEXOL 350 MG/ML SOLN COMPARISON:  None. FINDINGS: CT HEAD FINDINGS Brain: There is no mass, hemorrhage or extra-axial collection. There is generalized atrophy without lobar predilection. There is hypoattenuation of the periventricular white matter, most commonly indicating chronic ischemic microangiopathy. There is an old right ACA territory infarct. Vascular: Atherosclerotic calcification. Skull: The visualized skull base, calvarium and extracranial soft tissues are normal. Sinuses/Orbits: No fluid levels or advanced mucosal thickening of the visualized paranasal sinuses. No mastoid or middle ear effusion. The orbits are normal. ASPECTS (Alberta Stroke Program Early CT Score) - Ganglionic level infarction (caudate, lentiform nuclei, internal capsule, insula, M1-M3 cortex): 7 - Supraganglionic infarction (M4-M6 cortex): 3 Total score (0-10 with 10 being normal): 10 CTA NECK FINDINGS SKELETON: There is no bony spinal canal stenosis. No lytic or blastic lesion. OTHER NECK: Normal pharynx, larynx and major  salivary glands. No cervical lymphadenopathy. Unremarkable thyroid gland. UPPER CHEST: No pneumothorax or pleural effusion. No nodules or masses. AORTIC ARCH: There is calcific atherosclerosis of the aortic arch. There is no aneurysm, dissection or hemodynamically significant stenosis of the visualized portion of the aorta. Conventional 3 vessel aortic branching pattern. The visualized proximal subclavian arteries are widely patent. RIGHT CAROTID SYSTEM: No dissection, occlusion or aneurysm. Mild atherosclerotic calcification at the carotid bifurcation without hemodynamically significant stenosis. LEFT CAROTID SYSTEM: No dissection, occlusion or aneurysm. Mild atherosclerotic calcification at the carotid bifurcation without hemodynamically significant stenosis. VERTEBRAL ARTERIES: Left dominant configuration. Atherosclerotic narrowing of both vertebral artery origins. There is no dissection, occlusion or flow-limiting stenosis to the skull base (V1-V3 segments). CTA HEAD FINDINGS POSTERIOR CIRCULATION: --Vertebral arteries: Severe stenosis of the distal left V4 segment. Right V4 segment is normal. --Inferior cerebellar arteries: Normal. --Basilar artery: Normal. --Superior cerebellar arteries: Normal. --Posterior cerebral arteries (PCA): Normal. ANTERIOR CIRCULATION: --Intracranial internal carotid arteries: Atherosclerotic calcification of the internal carotid arteries at the skull base without hemodynamically significant stenosis. 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 Atherosclerotic irregularity. --Middle cerebral arteries (MCA): Severe stenosis of the right M2 segment. Otherwise multifocal atherosclerotic irregularity. VENOUS SINUSES: As permitted by contrast timing, patent. ANATOMIC VARIANTS: None Review of the MIP images confirms the above findings. IMPRESSION: 1. No acute hemorrhage. ASPECTS is 10. These results were called by  telephone at the  time of interpretation on 12/12/2020 at 10:33 pm to provider [facility] , who verbally acknowledged these results. 2. No emergent large vessel occlusion. 3. Severe stenosis of the right MCA M2 segment. 4. Severe stenosis of the left vertebral artery V4 segment. 5. 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 Aortic Atherosclerosis (ICD10-I70.0). Electronically Signed   By: Deatra Robinson M.D.   On: 12/12/2020 22:44   CT ANGIO NECK CODE STROKE  Result Date: 12/12/2020 CLINICAL DATA:  Neuro deficit, acute, stroke suspected; Stroke/TIA, assess extracranial arteries; Stroke/TIA, assess intracranial arteries EXAM: CT HEAD WITHOUT CONTRAST CT ANGIOGRAPHY OF THE HEAD AND NECK TECHNIQUE: Contiguous axial images were obtained from the base of the skull through the vertex without intravenous contrast. Multidetector CT imaging of the head and neck was performed using the standard protocol during bolus administration of intravenous contrast. Multiplanar CT image reconstructions and MIPs were obtained to evaluate the vascular anatomy. Carotid stenosis measurements (when applicable) are obtained utilizing NASCET criteria, using the distal internal carotid diameter as the denominator. CONTRAST:  75mL OMNIPAQUE IOHEXOL 350 MG/ML SOLN COMPARISON:  None. FINDINGS: CT HEAD FINDINGS Brain: There is no mass, hemorrhage or extra-axial collection. There is generalized atrophy without lobar predilection. There is hypoattenuation of the periventricular white matter, most commonly indicating chronic ischemic microangiopathy. There is an old right ACA territory infarct. Vascular: Atherosclerotic calcification. Skull: The visualized skull base, calvarium and extracranial soft tissues are normal. Sinuses/Orbits: No fluid levels or advanced mucosal thickening of the visualized paranasal sinuses. No mastoid or middle ear effusion. The orbits are normal. ASPECTS (Alberta Stroke Program Early CT Score) - Ganglionic  level infarction (caudate, lentiform nuclei, internal capsule, insula, M1-M3 cortex): 7 - Supraganglionic infarction (M4-M6 cortex): 3 Total score (0-10 with 10 being normal): 10 CTA NECK FINDINGS SKELETON: There is no bony spinal canal stenosis. No lytic or blastic lesion. OTHER NECK: Normal pharynx, larynx and major salivary glands. No cervical lymphadenopathy. Unremarkable thyroid gland. UPPER CHEST: No pneumothorax or pleural effusion. No nodules or masses. AORTIC ARCH: There is calcific atherosclerosis of the aortic arch. There is no aneurysm, dissection or hemodynamically significant stenosis of the visualized portion of the aorta. Conventional 3 vessel aortic branching pattern. The visualized proximal subclavian arteries are widely patent. RIGHT CAROTID SYSTEM: No dissection, occlusion or aneurysm. Mild atherosclerotic calcification at the carotid bifurcation without hemodynamically significant stenosis. LEFT CAROTID SYSTEM: No dissection, occlusion or aneurysm. Mild atherosclerotic calcification at the carotid bifurcation without hemodynamically significant stenosis. VERTEBRAL ARTERIES: Left dominant configuration. Atherosclerotic narrowing of both vertebral artery origins. There is no dissection, occlusion or flow-limiting stenosis to the skull base (V1-V3 segments). CTA HEAD FINDINGS POSTERIOR CIRCULATION: --Vertebral arteries: Severe stenosis of the distal left V4 segment. Right V4 segment is normal. --Inferior cerebellar arteries: Normal. --Basilar artery: Normal. --Superior cerebellar arteries: Normal. --Posterior cerebral arteries (PCA): Normal. ANTERIOR CIRCULATION: --Intracranial internal carotid arteries: Atherosclerotic calcification of the internal carotid arteries at the skull base without hemodynamically significant stenosis. 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 Atherosclerotic irregularity. --Middle cerebral arteries (MCA):  Severe stenosis of the right M2 segment. Otherwise multifocal atherosclerotic irregularity. VENOUS SINUSES: As permitted by contrast timing, patent. ANATOMIC VARIANTS: None Review of the MIP images confirms the above findings. IMPRESSION: 1. No acute hemorrhage. ASPECTS is 10. These results were called by telephone at the time of interpretation on 12/12/2020 at 10:33 pm to provider [facility] , who verbally acknowledged these results. 2.  No emergent large vessel occlusion. 3. Severe stenosis of the right MCA M2 segment. 4. Severe stenosis of the left vertebral artery V4 segment. 5. 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 Aortic Atherosclerosis (ICD10-I70.0). Electronically Signed   By: Deatra Robinson M.D.   On: 12/12/2020 22:44        Scheduled Meds:  insulin aspart  0-6 Units Subcutaneous Q4H   Continuous Infusions:  sodium chloride 75 mL/hr at 12/13/20 0244   lacosamide (VIMPAT) IV       LOS: 0 days   Time spent= 35 mins    Merritt Mccravy Joline Maxcy, MD [facility]  If 7PM-7AM, please contact night-coverage  12/13/2020, 8:00 AM

## 2020-12-13 NOTE — Procedures (Addendum)
Patient Name: Jeffery Haynes  MRN: [id number]  Epilepsy Attending: Charlsie Quest  Referring Physician/Provider: Dr Erick Blinks Duration: 12/13/2020 0149 to 12/13/2020 1259   Patient history:  87 y.o. male with PMH significant for with PMH significant for alzheimers, HTN, hx of prior R ACA territory stroke, dementia, nursing home resident, prior hx of seizures in the setting of hyponatremia and R sharps on EEG in 2012 who is brought in as a stroke code for less responsive. EEG to evaluate for seizure.   Level of alertness: lethargic    AEDs during EEG study:  LCM   Technical aspects: This EEG study was done with scalp electrodes positioned according to the 10-20 International system of electrode placement. Electrical activity was acquired at a sampling rate of 500Hz  and reviewed with a high frequency filter of 70Hz  and a low frequency filter of 1Hz . EEG data were recorded continuously and digitally stored.    Description: EEG showed continuous generalized polymorphic mixed frequencies with predominantly 3-5Hz  theta- delta slowing admixed with 8-10hz  generalized alpha activity. Intermittent generalized eeg attenuation lasting 2-3 seconds was also noted. Hyperventilation and photic stimulation were not performed.     ABNORMALITY - Continuous slow, generalized   IMPRESSION: This study is suggestive of moderate to severe diffuse encephalopathy, nonspecific etiology. No seizures or epileptiform discharges were seen throughout the recording.   Isabellarose Kope 

## 2020-12-13 NOTE — Progress Notes (Signed)
D/C  LTM  [facility] notified  No skin breakdown noted at all skin sites

## 2020-12-13 NOTE — Progress Notes (Addendum)
LTM EEG hooked up and running - no initial skin breakdown - push button tested - neuro notified. [facility] monitoring.  

## 2020-12-13 NOTE — Progress Notes (Addendum)
EEG complete - results pending 

## 2020-12-13 NOTE — Progress Notes (Signed)
Subjective: Continues to have some increased ton e in the elft arm, but EEG is just slow.   I spoke with his nursing home, and he does tend to favor holding his left arm flexed, but typically he can straighten it out.  They state that he has been doing this since a fall about three or 4 weeks ago.  At baseline, he is nonambulatory, he is able to have a conversation though it may not all make sense.  He is alert and oriented x2 per nursing home staff.  Exam: Vitals:   12/13/20 0919 12/13/20 0943  BP:    Pulse:    Resp:    Temp: 99.5 F (37.5 C) 99.6 F (37.6 C)  SpO2:     Gen: In bed, NAD Resp: non-labored breathing, no acute distress Abd: soft, nt  Neuro: MS: keeps eyes closed, does not follow commands.  SW:FUXNAT reactive bilaterally Motor: He holds his left arm flexed with increased tone, withdraws to noxious stimulation in all four extremities Sensory: As above  Pertinent Labs: TSH is normal  Impression: 87 yo M with fairly advanced dementia who is nonambulatory at baseline who presents with worsening mental status and increased tone in his left upper extremity.  He continues to hold his left arm in a flexion position, but nothing on EEG to suggest that this is an epileptic pattern.  At this time, especially given the history that he was holding it somewhat flexed even before this makes me think that this is more likely to be worsening of underlying neurological disease due to some physiological stressor than a primary neurological event.  That being said, I do think an MRI is in order.  Recommendations: 1) discontinue EEG 2) MRI brain 3) continue assessment for infectious sources.  Ritta Slot, MD [facility] [phone]  If 7pm- 7am, please page neurology on call as listed in [facility].

## 2020-12-13 NOTE — Progress Notes (Signed)
LTM maintenance  Checked pt event button w/[facility]   no skin breakdown noted at Fp1  FP2  A1  A2

## 2020-12-13 NOTE — ED Notes (Signed)
Pt appeared to having upward gaze with increased contracture to L arm; Dr.Khaliqdina made aware

## 2020-12-13 NOTE — Procedures (Addendum)
Patient Name: Jeffery Haynes  MRN: [id number]  Epilepsy Attending: Charlsie Quest  Referring Physician/Provider: Dr Erick Blinks Date: 12/13/2020 Duration: 26.32 mins  Patient history:  87 y.o. male with PMH significant for with PMH significant for alzheimers, HTN, hx of prior R ACA territory stroke, dementia, nursing home resident, prior hx of seizures in the setting of hyponatremia and R sharps on EEG in 2012 who is brought in as a stroke code for less responsive. EEG to evaluate for seizure.  Level of alertness: lethargic   AEDs during EEG study: LEV, LCM  Technical aspects: This EEG study was done with scalp electrodes positioned according to the 10-20 International system of electrode placement. Electrical activity was acquired at a sampling rate of 500Hz  and reviewed with a high frequency filter of 70Hz  and a low frequency filter of 1Hz . EEG data were recorded continuously and digitally stored.   Description: EEG showed continuous generalized 2-3 Hz delta slowing.  Hyperventilation and photic stimulation were not performed.     Of note, EEG was technically difficult due to significant myogenic artifact was noted.  ABNORMALITY - Continuous slow, generalized  IMPRESSION: This technically difficult study is suggestive of moderate to severe diffuse encephalopathy, nonspecific etiology. No seizures or epileptiform discharges were seen throughout the recording.  Brees Hounshell 

## 2020-12-14 DIAGNOSIS — R569 Unspecified convulsions: Secondary | ICD-10-CM | POA: Diagnosis not present

## 2020-12-14 DIAGNOSIS — G40901 Epilepsy, unspecified, not intractable, with status epilepticus: Secondary | ICD-10-CM | POA: Diagnosis not present

## 2020-12-14 LAB — CBC
HCT: 32.1 % — ABNORMAL LOW (ref 39.0–52.0)
Hemoglobin: 10.5 g/dL — ABNORMAL LOW (ref 13.0–17.0)
MCH: 30.1 pg (ref 26.0–34.0)
MCHC: 32.7 g/dL (ref 30.0–36.0)
MCV: 92 fL (ref 80.0–100.0)
Platelets: 236 10*3/uL (ref 150–400)
RBC: 3.49 MIL/uL — ABNORMAL LOW (ref 4.22–5.81)
RDW: 14.4 % (ref 11.5–15.5)
WBC: 7 10*3/uL (ref 4.0–10.5)
nRBC: 0 % (ref 0.0–0.2)

## 2020-12-14 LAB — COMPREHENSIVE METABOLIC PANEL
ALT: 10 U/L (ref 0–44)
AST: 13 U/L — ABNORMAL LOW (ref 15–41)
Albumin: 3 g/dL — ABNORMAL LOW (ref 3.5–5.0)
Alkaline Phosphatase: 61 U/L (ref 38–126)
Anion gap: 6 (ref 5–15)
BUN: 21 mg/dL (ref 8–23)
CO2: 26 mmol/L (ref 22–32)
Calcium: 8.6 mg/dL — ABNORMAL LOW (ref 8.9–10.3)
Chloride: 102 mmol/L (ref 98–111)
Creatinine, Ser: 0.86 mg/dL (ref 0.61–1.24)
GFR, Estimated: >60 mL/min (ref >60)
Glucose, Bld: 168 mg/dL — ABNORMAL HIGH (ref 70–99)
Potassium: 3.9 mmol/L (ref 3.5–5.1)
Sodium: 134 mmol/L — ABNORMAL LOW (ref 135–145)
Total Bilirubin: 0.8 mg/dL (ref 0.3–1.2)
Total Protein: 5.3 g/dL — ABNORMAL LOW (ref 6.5–8.1)

## 2020-12-14 LAB — GLUCOSE, CAPILLARY
Glucose-Capillary: 152 mg/dL — ABNORMAL HIGH (ref 70–99)
Glucose-Capillary: 136 mg/dL — ABNORMAL HIGH (ref 70–99)
Glucose-Capillary: 125 mg/dL — ABNORMAL HIGH (ref 70–99)
Glucose-Capillary: 130 mg/dL — ABNORMAL HIGH (ref 70–99)
Glucose-Capillary: 123 mg/dL — ABNORMAL HIGH (ref 70–99)

## 2020-12-14 LAB — MRSA NEXT GEN BY PCR, NASAL: MRSA by PCR Next Gen: NOT DETECTED

## 2020-12-14 LAB — MAGNESIUM: Magnesium: 1.6 mg/dL — ABNORMAL LOW (ref 1.7–2.4)

## 2020-12-14 MED ORDER — DIVALPROEX SODIUM 125 MG PO CSDR
1000.0000 mg | DELAYED_RELEASE_CAPSULE | Freq: Once | ORAL | Status: DC
  Filled 2020-12-14: qty 8

## 2020-12-14 MED ORDER — MAGNESIUM SULFATE 4 GM/100ML IV SOLN
4.0000 g | Freq: Once | INTRAVENOUS | Status: AC
  Administered 2020-12-14: 4 g via INTRAVENOUS
  Filled 2020-12-14: qty 100

## 2020-12-14 MED ORDER — HALOPERIDOL LACTATE 5 MG/ML IJ SOLN: 2.0000 mg | Freq: Four times a day (QID) | INTRAMUSCULAR | Status: DC | PRN

## 2020-12-14 MED ORDER — HALOPERIDOL 1 MG PO TABS: 2.0000 mg | ORAL_TABLET | Freq: Four times a day (QID) | ORAL | Status: DC | PRN

## 2020-12-14 MED ORDER — DIVALPROEX SODIUM 125 MG PO CSDR
500.0000 mg | DELAYED_RELEASE_CAPSULE | Freq: Two times a day (BID) | ORAL | Status: DC
  Administered 2020-12-14 – 2020-12-16 (×4): 500 mg via ORAL
  Filled 2020-12-14 (×5): qty 4

## 2020-12-14 NOTE — Progress Notes (Signed)
, PROGRESS NOTE    Rhyse Loux  TDD:[id number] DOB: September 01, 1931 DOA: 12/12/2020 PCP: Eloisa Northern, MD   Brief Narrative:  87 year old with history of Alzheimer's dementia, paroxysmal A. fib, DM2 CVA, seizures, HTN, BPH comes to the hospital for left upper extremity contraction and somnolence.  He was last seen normal 10 AM on the day of admission.  CT of the head, CTA head and neck was unremarkable.  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  Neurology was consulted, he was started on Keppra and Vimpat.  EEG showed generalized slowing without any obvious seizure activity.  MRI brain showed old CVA but no acute pathology.   Assessment & Plan:   Principal Problem:   Seizure (HCC) Active Problems:   Alzheimer's dementia (HCC)   Essential hypertension   Diabetes mellitus type 2 in nonobese [facility])  Seizure with concerns of postictal state Altered mental status -CT of the head, CTA head and neck negative for acute findings but does show severe stenosis of MCA region with other areas of chronic stenosis and occlusion.  No evidence of infection, continue to hold off antibiotics.  Patient's mentation is improving, suspect secondary to medications?  He is also not showing episodes of delirium and aggressive behavior therefore we will place Haldol as needed - EEG-pending. Needs LP? And MRI Brain?  - IV Keppra and Vimpat started.  Ativan IV as needed - Neurology is following.  - Foley placed 10/1 - TSH, ammonia normal   Essential hypertension -Currently patient is on IV hydralazine as needed - On hold-Norvasc   Paroxysmal atrial fibrillation -Patient is no longer on anticoagulation which was discontinued outpatient by PCP - IV Lopressor as needed  Non-insulin-dependent type II diabetes -A1c-6.9.  Metoprolol on hold - Sliding scale and Accu-Cheks   Alzheimer's dementia Mood disorder BPH -Unable to take any home  medications-donepezil, Celexa       DVT prophylaxis: SCDs Start: 12/13/20 0016 Code Status: DNR Family Communication: Had extensive discussion with wife yesterday  Status is: Inpatient  Remains inpatient appropriate because:Inpatient level of care appropriate due to severity of illness  Dispo: The patient is from: Home              Anticipated d/c is to:  TBD              Patient currently is not medically stable to d/c.   Difficult to place patient No      Subjective: Patient laying in the bed this morning, responding to verbal stimuli but does not have any meaningful conversation.  Per nursing staff patient off-and-on showing aggressive behavior at home and attempts are made to turn him   Examination:  Constitutional: Not in acute distress Respiratory: Clear to auscultation bilaterally Cardiovascular: Normal sinus rhythm, no rubs Abdomen: Nontender nondistended good bowel sounds Musculoskeletal: No edema noted Skin: No rashes seen Neurologic: CN 2-12 grossly intact.  And nonfocal.  Grossly moving all extremities Psychiatric: Poor judgment and insight.  Alert to his name only   Objective: Vitals:   12/14/20 0000 12/14/20 0200 12/14/20 0434 12/14/20 0600  BP: (!) 145/47 (!) 106/47 123/83 (!) 96/55  Pulse: 67 75 76 67  Resp: 13 14  16   Temp:   97.8 F (36.6 C)   TempSrc:   Oral   SpO2: 96% 98% 96% 97%    Intake/Output Summary (Last 24 hours) at 12/14/2020 0751 Last data filed at 12/14/2020 0636 Gross per 24  hour  Intake 1684.05 ml  Output 1430 ml  Net 254.05 ml   There were no vitals filed for this visit.   Data Reviewed:   CBC: Recent Labs  Lab 12/12/20 2211 12/12/20 2257 12/14/20 0205  WBC  --  7.6 7.0  NEUTROABS  --  5.4  --   HGB 13.9 11.3* 10.5*  HCT 41.0 35.6* 32.1*  MCV  --  93.2 92.0  PLT  --  263 236   Basic Metabolic Panel: Recent Labs  Lab 12/12/20 2211 12/12/20 2257 12/14/20 0205  NA 135 134* 134*  K 4.9 4.6 3.9  CL 102 101  102  CO2  --  25 26  GLUCOSE 168* 156* 168*  BUN 30* 25* 21  CREATININE 0.90 0.93 0.86  CALCIUM  --  9.2 8.6*  MG  --   --  1.6*   GFR: CrCl cannot be calculated (Unknown ideal weight.). Liver Function Tests: Recent Labs  Lab 12/12/20 2257 12/14/20 0205  AST 11* 13*  ALT 11 10  ALKPHOS 78 61  BILITOT 0.4 0.8  PROT 6.1* 5.3*  ALBUMIN 3.4* 3.0*   No results for input(s): LIPASE, AMYLASE in the last 168 hours. Recent Labs  Lab 12/13/20 1309  AMMONIA 28   Coagulation Profile: Recent Labs  Lab 12/12/20 2257  INR 1.0   Cardiac Enzymes: Recent Labs  Lab 12/13/20 0920  CKTOTAL 88   BNP (last 3 results) No results for input(s): PROBNP in the last 8760 hours. HbA1C: Recent Labs    12/13/20 0218  HGBA1C 6.9*   CBG: Recent Labs  Lab 12/13/20 1217 12/13/20 1647 12/13/20 1953 12/13/20 2154 12/14/20 0749  GLUCAP 134* 145* 148* 147* 152*   Lipid Profile: No results for input(s): CHOL, HDL, LDLCALC, TRIG, CHOLHDL, LDLDIRECT in the last 72 hours. Thyroid Function Tests: Recent Labs    12/13/20 0920  TSH 2.431   Anemia Panel: No results for input(s): VITAMINB12, FOLATE, FERRITIN, TIBC, IRON, RETICCTPCT in the last 72 hours. Sepsis Labs: No results for input(s): PROCALCITON, LATICACIDVEN in the last 168 hours.  Recent Results (from the past 240 hour(s))  Resp Panel by RT-PCR (Flu A&B, Covid) Nasopharyngeal Swab     Status: None   Collection Time: 12/12/20 10:57 PM   Specimen: Nasopharyngeal Swab; Nasopharyngeal(NP) swabs in vial transport medium  Result Value Ref Range Status   SARS Coronavirus 2 by RT PCR NEGATIVE NEGATIVE Final    Comment: (NOTE) SARS-CoV-2 target nucleic acids are NOT DETECTED.  The SARS-CoV-2 RNA is generally detectable in upper respiratory specimens during the acute phase of infection. The lowest concentration of SARS-CoV-2 viral copies this assay can detect is 138 copies/mL. A negative result does not preclude SARS-Cov-2 infection  and should not be used as the sole basis for treatment or other patient management decisions. A negative result may occur with  improper specimen collection/handling, submission of specimen other than nasopharyngeal swab, presence of viral mutation(s) within the areas targeted by this assay, and inadequate number of viral copies(<138 copies/mL). A negative result must be combined with clinical observations, patient history, and epidemiological information. The expected result is Negative.  Fact Sheet for Patients:  BloggerCourse.com  Fact Sheet for Healthcare Providers:  SeriousBroker.it  This test is no t yet approved or cleared by the [place] FDA and  has been authorized for detection and/or diagnosis of SARS-CoV-2 by FDA under an Emergency Use Authorization (EUA). This EUA will remain  in effect (meaning this test can be used)  for the duration of the COVID-19 declaration under Section 564(b)(1) of the Act, 21 U.S.C.section 360bbb-3(b)(1), unless the authorization is terminated  or revoked sooner.       Influenza A by PCR NEGATIVE NEGATIVE Final   Influenza B by PCR NEGATIVE NEGATIVE Final    Comment: (NOTE) The Xpert Xpress SARS-CoV-2/FLU/RSV plus assay is intended as an aid in the diagnosis of influenza from Nasopharyngeal swab specimens and should not be used as a sole basis for treatment. Nasal washings and aspirates are unacceptable for Xpert Xpress SARS-CoV-2/FLU/RSV testing.  Fact Sheet for Patients: BloggerCourse.com  Fact Sheet for Healthcare Providers: SeriousBroker.it  This test is not yet approved or cleared by the [place] FDA and has been authorized for detection and/or diagnosis of SARS-CoV-2 by FDA under an Emergency Use Authorization (EUA). This EUA will remain in effect (meaning this test can be used) for the duration of the COVID-19 declaration  under Section 564(b)(1) of the Act, 21 U.S.C. section 360bbb-3(b)(1), unless the authorization is terminated or revoked.  Performed at [facility], [address] [place], [place] [address]   MRSA Next Gen by PCR, Nasal     Status: None   Collection Time: 12/13/20 10:42 PM   Specimen: Nasal Mucosa; Nasal Swab  Result Value Ref Range Status   MRSA by PCR Next Gen NOT DETECTED NOT DETECTED Final    Comment: (NOTE) The GeneXpert MRSA Assay (FDA approved for NASAL specimens only), is one component of a comprehensive MRSA colonization surveillance program. It is not intended to diagnose MRSA infection nor to guide or monitor treatment for MRSA infections. Test performance is not FDA approved in patients less than 64 years old. Performed at [facility], [address], [place], [place] [address]          Radiology Studies: MR BRAIN WO CONTRAST  Result Date: 12/13/2020 CLINICAL DATA:  Neuro deficit, acute, stroke suspected. History of stroke, dementia, and diabetes. EXAM: MRI HEAD WITHOUT CONTRAST TECHNIQUE: Multiplanar, multiecho pulse sequences of the brain and surrounding structures were obtained without intravenous contrast. COMPARISON:  Head CT 12/12/2020 FINDINGS: Brain: There is no evidence of an acute infarct, mass, midline shift, or extra-axial fluid collection. There is a large chronic right ACA infarct with associated chronic blood products. T2 hyperintensities elsewhere in the cerebral white matter bilaterally are nonspecific but compatible with moderate chronic small vessel ischemic disease. Mild chronic small vessel changes are also noted in the pons. There is a chronic microhemorrhage in the mesial right temporal lobe. There is moderate cerebral atrophy. Vascular: Major intracranial vascular flow voids are preserved. Skull and upper cervical spine: Unremarkable bone marrow signal para Sinuses/Orbits: Bilateral cataract extraction. Tiny mucous retention cyst in the  right maxillary sinus. Small right mastoid effusion. Other: None. IMPRESSION: 1. No acute intracranial abnormality. 2. Large chronic right ACA infarct. 3. Moderate chronic small vessel ischemic disease and cerebral atrophy. Electronically Signed   By: Sebastian Ache M.D.   On: 12/13/2020 14:42   EEG adult  Result Date: 12/13/2020 Charlsie Quest, MD     12/13/2020 10:11 AM Patient Name: Anson Peddie MRN: [id number] Epilepsy Attending: Charlsie Quest Referring Physician/Provider: Dr Erick Blinks Date: 12/13/2020 Duration: 26.32 mins Patient history:  87 y.o. male with PMH significant for with PMH significant for alzheimers, HTN, hx of prior R ACA territory stroke, dementia, nursing home resident, prior hx of seizures in the setting of hyponatremia and R sharps on EEG in 2012 who is brought  in as a stroke code for less responsive. EEG to evaluate for seizure. Level of alertness: lethargic AEDs during EEG study: LEV, LCM Technical aspects: This EEG study was done with scalp electrodes positioned according to the 10-20 International system of electrode placement. Electrical activity was acquired at a sampling rate of 500Hz  and reviewed with a high frequency filter of 70Hz  and a low frequency filter of 1Hz . EEG data were recorded continuously and digitally stored. Description: EEG showed continuous generalized 2-3 Hz delta slowing.  Hyperventilation and photic stimulation were not performed.   Of note, EEG was technically difficult due to significant myogenic artifact was noted. ABNORMALITY - Continuous slow, generalized IMPRESSION: This technically difficult study is suggestive of moderate to severe diffuse encephalopathy, nonspecific etiology. No seizures or epileptiform discharges were seen throughout the recording. Priyanka   Overnight EEG with video  Result Date: 12/13/2020 , MD     12/13/2020 10:14 AM Patient Name: Denard Tuminello MRN: Charlsie Quest Epilepsy Attending: 02/12/2021 Referring Physician/Provider: Dr Doran Heater Duration: 12/13/2020 0149 to 12/13/2020 1010  Patient history:  87 y.o. male with PMH significant for with PMH significant for alzheimers, HTN, hx of prior R ACA territory stroke, dementia, nursing home resident, prior hx of seizures in the setting of hyponatremia and R sharps on EEG in 2012 who is brought in as a stroke code for less responsive. EEG to evaluate for seizure.  Level of alertness: lethargic  AEDs during EEG study:  LCM  Technical aspects: This EEG study was done with scalp electrodes positioned according to the 10-20 International system of electrode placement. Electrical activity was acquired at a sampling rate of 500Hz  and reviewed with a high frequency filter of 70Hz  and a low frequency filter of 1Hz . EEG data were recorded continuously and digitally stored.  Description: EEG showed continuous generalized polymorphic mixed frequencies with predominantly 3-5Hz  theta- delta slowing admixed with 8-10hz  generalized alpha activity. Intermittent generalized eeg attenuation lasting 2-3 seconds was also noted. Hyperventilation and photic stimulation were not performed.    ABNORMALITY - Continuous slow, generalized  IMPRESSION: This study is suggestive of moderate to severe diffuse encephalopathy, nonspecific etiology. No seizures or epileptiform discharges were seen throughout the recording.  02/12/2021   CT HEAD CODE STROKE WO CONTRAST  Result Date: 12/12/2020 CLINICAL DATA:  Neuro deficit, acute, stroke suspected; Stroke/TIA, assess extracranial arteries; Stroke/TIA, assess intracranial arteries EXAM: CT HEAD WITHOUT CONTRAST CT ANGIOGRAPHY OF THE HEAD AND NECK TECHNIQUE: Contiguous axial images were obtained from the base of the skull through the vertex without intravenous contrast. Multidetector CT imaging of the head and neck was performed using the standard protocol during bolus administration of intravenous contrast. Multiplanar CT  image reconstructions and MIPs were obtained to evaluate the vascular anatomy. Carotid stenosis measurements (when applicable) are obtained utilizing NASCET criteria, using the distal internal carotid diameter as the denominator. CONTRAST:  45mL OMNIPAQUE IOHEXOL 350 MG/ML SOLN COMPARISON:  None. FINDINGS: CT HEAD FINDINGS Brain: There is no mass, hemorrhage or extra-axial collection. There is generalized atrophy without lobar predilection. There is hypoattenuation of the periventricular white matter, most commonly indicating chronic ischemic microangiopathy. There is an old right ACA territory infarct. Vascular: Atherosclerotic calcification. Skull: The visualized skull base, calvarium and extracranial soft tissues are normal. Sinuses/Orbits: No fluid levels or advanced mucosal thickening of the visualized paranasal sinuses. No mastoid or middle ear effusion. The orbits are normal. ASPECTS [facility] Stroke Program Early CT Score) - Ganglionic level infarction (  caudate, lentiform nuclei, internal capsule, insula, M1-M3 cortex): 7 - Supraganglionic infarction (M4-M6 cortex): 3 Total score (0-10 with 10 being normal): 10 CTA NECK FINDINGS SKELETON: There is no bony spinal canal stenosis. No lytic or blastic lesion. OTHER NECK: Normal pharynx, larynx and major salivary glands. No cervical lymphadenopathy. Unremarkable thyroid gland. UPPER CHEST: No pneumothorax or pleural effusion. No nodules or masses. AORTIC ARCH: There is calcific atherosclerosis of the aortic arch. There is no aneurysm, dissection or hemodynamically significant stenosis of the visualized portion of the aorta. Conventional 3 vessel aortic branching pattern. The visualized proximal subclavian arteries are widely patent. RIGHT CAROTID SYSTEM: No dissection, occlusion or aneurysm. Mild atherosclerotic calcification at the carotid bifurcation without hemodynamically significant stenosis. LEFT CAROTID SYSTEM: No dissection, occlusion or aneurysm. Mild  atherosclerotic calcification at the carotid bifurcation without hemodynamically significant stenosis. VERTEBRAL ARTERIES: Left dominant configuration. Atherosclerotic narrowing of both vertebral artery origins. There is no dissection, occlusion or flow-limiting stenosis to the skull base (V1-V3 segments). CTA HEAD FINDINGS POSTERIOR CIRCULATION: --Vertebral arteries: Severe stenosis of the distal left V4 segment. Right V4 segment is normal. --Inferior cerebellar arteries: Normal. --Basilar artery: Normal. --Superior cerebellar arteries: Normal. --Posterior cerebral arteries (PCA): Normal. ANTERIOR CIRCULATION: --Intracranial internal carotid arteries: Atherosclerotic calcification of the internal carotid arteries at the skull base without hemodynamically significant stenosis. 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 Atherosclerotic irregularity. --Middle cerebral arteries (MCA): Severe stenosis of the right M2 segment. Otherwise multifocal atherosclerotic irregularity. VENOUS SINUSES: As permitted by contrast timing, patent. ANATOMIC VARIANTS: None Review of the MIP images confirms the above findings. IMPRESSION: 1. No acute hemorrhage. ASPECTS is 10. These results were called by telephone at the time of interpretation on 12/12/2020 at 10:33 pm to provider [facility] , who verbally acknowledged these results. 2. No emergent large vessel occlusion. 3. Severe stenosis of the right MCA M2 segment. 4. Severe stenosis of the left vertebral artery V4 segment. 5. 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 Aortic Atherosclerosis (ICD10-I70.0). Electronically Signed   By: Deatra Robinson M.D.   On: 12/12/2020 22:44   CT ANGIO HEAD CODE STROKE  Result Date: 12/12/2020 CLINICAL DATA:  Neuro deficit, acute, stroke suspected; Stroke/TIA, assess extracranial arteries; Stroke/TIA, assess intracranial arteries EXAM: CT HEAD WITHOUT  CONTRAST CT ANGIOGRAPHY OF THE HEAD AND NECK TECHNIQUE: Contiguous axial images were obtained from the base of the skull through the vertex without intravenous contrast. Multidetector CT imaging of the head and neck was performed using the standard protocol during bolus administration of intravenous contrast. Multiplanar CT image reconstructions and MIPs were obtained to evaluate the vascular anatomy. Carotid stenosis measurements (when applicable) are obtained utilizing NASCET criteria, using the distal internal carotid diameter as the denominator. CONTRAST:  70mL OMNIPAQUE IOHEXOL 350 MG/ML SOLN COMPARISON:  None. FINDINGS: CT HEAD FINDINGS Brain: There is no mass, hemorrhage or extra-axial collection. There is generalized atrophy without lobar predilection. There is hypoattenuation of the periventricular white matter, most commonly indicating chronic ischemic microangiopathy. There is an old right ACA territory infarct. Vascular: Atherosclerotic calcification. Skull: The visualized skull base, calvarium and extracranial soft tissues are normal. Sinuses/Orbits: No fluid levels or advanced mucosal thickening of the visualized paranasal sinuses. No mastoid or middle ear effusion. The orbits are normal. ASPECTS [facility] Stroke Program Early CT Score) - Ganglionic level infarction (caudate, lentiform nuclei, internal capsule, insula, M1-M3 cortex): 7 - Supraganglionic infarction (M4-M6 cortex): 3 Total score (0-10 with 10 being normal): 10  CTA NECK FINDINGS SKELETON: There is no bony spinal canal stenosis. No lytic or blastic lesion. OTHER NECK: Normal pharynx, larynx and major salivary glands. No cervical lymphadenopathy. Unremarkable thyroid gland. UPPER CHEST: No pneumothorax or pleural effusion. No nodules or masses. AORTIC ARCH: There is calcific atherosclerosis of the aortic arch. There is no aneurysm, dissection or hemodynamically significant stenosis of the visualized portion of the aorta. Conventional 3  vessel aortic branching pattern. The visualized proximal subclavian arteries are widely patent. RIGHT CAROTID SYSTEM: No dissection, occlusion or aneurysm. Mild atherosclerotic calcification at the carotid bifurcation without hemodynamically significant stenosis. LEFT CAROTID SYSTEM: No dissection, occlusion or aneurysm. Mild atherosclerotic calcification at the carotid bifurcation without hemodynamically significant stenosis. VERTEBRAL ARTERIES: Left dominant configuration. Atherosclerotic narrowing of both vertebral artery origins. There is no dissection, occlusion or flow-limiting stenosis to the skull base (V1-V3 segments). CTA HEAD FINDINGS POSTERIOR CIRCULATION: --Vertebral arteries: Severe stenosis of the distal left V4 segment. Right V4 segment is normal. --Inferior cerebellar arteries: Normal. --Basilar artery: Normal. --Superior cerebellar arteries: Normal. --Posterior cerebral arteries (PCA): Normal. ANTERIOR CIRCULATION: --Intracranial internal carotid arteries: Atherosclerotic calcification of the internal carotid arteries at the skull base without hemodynamically significant stenosis. 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 Atherosclerotic irregularity. --Middle cerebral arteries (MCA): Severe stenosis of the right M2 segment. Otherwise multifocal atherosclerotic irregularity. VENOUS SINUSES: As permitted by contrast timing, patent. ANATOMIC VARIANTS: None Review of the MIP images confirms the above findings. IMPRESSION: 1. No acute hemorrhage. ASPECTS is 10. These results were called by telephone at the time of interpretation on 12/12/2020 at 10:33 pm to provider [facility] , who verbally acknowledged these results. 2. No emergent large vessel occlusion. 3. Severe stenosis of the right MCA M2 segment. 4. Severe stenosis of the left vertebral artery V4 segment. 5. 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  Aortic Atherosclerosis (ICD10-I70.0). Electronically Signed   By: Deatra Robinson M.D.   On: 12/12/2020 22:44   CT ANGIO NECK CODE STROKE  Result Date: 12/12/2020 CLINICAL DATA:  Neuro deficit, acute, stroke suspected; Stroke/TIA, assess extracranial arteries; Stroke/TIA, assess intracranial arteries EXAM: CT HEAD WITHOUT CONTRAST CT ANGIOGRAPHY OF THE HEAD AND NECK TECHNIQUE: Contiguous axial images were obtained from the base of the skull through the vertex without intravenous contrast. Multidetector CT imaging of the head and neck was performed using the standard protocol during bolus administration of intravenous contrast. Multiplanar CT image reconstructions and MIPs were obtained to evaluate the vascular anatomy. Carotid stenosis measurements (when applicable) are obtained utilizing NASCET criteria, using the distal internal carotid diameter as the denominator. CONTRAST:  75mL OMNIPAQUE IOHEXOL 350 MG/ML SOLN COMPARISON:  None. FINDINGS: CT HEAD FINDINGS Brain: There is no mass, hemorrhage or extra-axial collection. There is generalized atrophy without lobar predilection. There is hypoattenuation of the periventricular white matter, most commonly indicating chronic ischemic microangiopathy. There is an old right ACA territory infarct. Vascular: Atherosclerotic calcification. Skull: The visualized skull base, calvarium and extracranial soft tissues are normal. Sinuses/Orbits: No fluid levels or advanced mucosal thickening of the visualized paranasal sinuses. No mastoid or middle ear effusion. The orbits are normal. ASPECTS ([place] Stroke Program Early CT Score) - Ganglionic level infarction (caudate, lentiform nuclei, internal capsule, insula, M1-M3 cortex): 7 - Supraganglionic infarction (M4-M6 cortex): 3 Total score (0-10 with 10 being normal): 10 CTA NECK FINDINGS SKELETON: There is no bony spinal canal stenosis. No lytic or blastic lesion. OTHER NECK: Normal pharynx, larynx and major  salivary glands. No  cervical lymphadenopathy. Unremarkable thyroid gland. UPPER CHEST: No pneumothorax or pleural effusion. No nodules or masses. AORTIC ARCH: There is calcific atherosclerosis of the aortic arch. There is no aneurysm, dissection or hemodynamically significant stenosis of the visualized portion of the aorta. Conventional 3 vessel aortic branching pattern. The visualized proximal subclavian arteries are widely patent. RIGHT CAROTID SYSTEM: No dissection, occlusion or aneurysm. Mild atherosclerotic calcification at the carotid bifurcation without hemodynamically significant stenosis. LEFT CAROTID SYSTEM: No dissection, occlusion or aneurysm. Mild atherosclerotic calcification at the carotid bifurcation without hemodynamically significant stenosis. VERTEBRAL ARTERIES: Left dominant configuration. Atherosclerotic narrowing of both vertebral artery origins. There is no dissection, occlusion or flow-limiting stenosis to the skull base (V1-V3 segments). CTA HEAD FINDINGS POSTERIOR CIRCULATION: --Vertebral arteries: Severe stenosis of the distal left V4 segment. Right V4 segment is normal. --Inferior cerebellar arteries: Normal. --Basilar artery: Normal. --Superior cerebellar arteries: Normal. --Posterior cerebral arteries (PCA): Normal. ANTERIOR CIRCULATION: --Intracranial internal carotid arteries: Atherosclerotic calcification of the internal carotid arteries at the skull base without hemodynamically significant stenosis. 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 Atherosclerotic irregularity. --Middle cerebral arteries (MCA): Severe stenosis of the right M2 segment. Otherwise multifocal atherosclerotic irregularity. VENOUS SINUSES: As permitted by contrast timing, patent. ANATOMIC VARIANTS: None Review of the MIP images confirms the above findings. IMPRESSION: 1. No acute hemorrhage. ASPECTS is 10. These results were called by telephone at the time of  interpretation on 12/12/2020 at 10:33 pm to provider [facility] , who verbally acknowledged these results. 2. No emergent large vessel occlusion. 3. Severe stenosis of the right MCA M2 segment. 4. Severe stenosis of the left vertebral artery V4 segment. 5. 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 Aortic Atherosclerosis (ICD10-I70.0). Electronically Signed   By: Deatra Robinson M.D.   On: 12/12/2020 22:44        Scheduled Meds:  Chlorhexidine Gluconate Cloth  6 each Topical Daily   insulin aspart  0-6 Units Subcutaneous Q4H   Continuous Infusions:  dextrose 5 % and 0.45% NaCl 75 mL/hr at 12/13/20 2256   lacosamide (VIMPAT) IV 50 mg (12/13/20 2303)   magnesium sulfate bolus IVPB       LOS: 1 day   Time spent= 35 mins    Jamy Cleckler Joline Maxcy, MD [facility]  If 7PM-7AM, please contact night-coverage  12/14/2020, 7:51 AM

## 2020-12-14 NOTE — Progress Notes (Signed)
Subjective: Patient is markedly improved today  Exam: Vitals:   12/14/20 1000 12/14/20 1100  BP: (!) 132/59 120/63  Pulse: 76 78  Resp: 19 20  Temp: 99.1 F (37.3 C)   SpO2: 100% 100%   Gen: In bed, NAD Resp: non-labored breathing, no acute distress Abd: soft, nt  Neuro: MS: Awake, oriented to person only, but he is interactive and answers questions readily. CN: Visual fields full, EOMI, left facial weakness Motor: He has a left hemiparesis, but this is mild 4/5 Sensory: He reports symmetric sensation  Pertinent Labs:   Impression: 87 year old male with a history of seizures in the setting of physiological stressors who had sharp waves seen on EEG in the past.  He was on lacosamide, but this was discontinued in 2019 out of concerns for sedation.  He is on Depakote as an outpatient at what is more of a behavioral control dose, but I think it would be reasonable to increase this rather than adding a new agent.  I suspect  that a seizure was the inciting event given lack of other explanation, but I am not sure that is definite.   Recommendations: 1) increase Depakote from 625 mg daily divided 3 times daily(250-125-250) to 500 mg twice daily(for a 475 mg increase in the total daily dose) 2) I will give a one-time 1 g oral dose given that he has not been on it in a couple of days. 3) discontinue lacosamide rather than having two agents 4) I would expect him to continue to recover gradually 5) restart donepezil 6) discontinue tramadol 7) Neurology will be available as needed.   Ritta Slot, MD [facility] [phone]  If 7pm- 7am, please page neurology on call as listed in AMION.

## 2020-12-15 DIAGNOSIS — R569 Unspecified convulsions: Secondary | ICD-10-CM | POA: Diagnosis not present

## 2020-12-15 LAB — GLUCOSE, CAPILLARY
Glucose-Capillary: 101 mg/dL — ABNORMAL HIGH (ref 70–99)
Glucose-Capillary: 105 mg/dL — ABNORMAL HIGH (ref 70–99)
Glucose-Capillary: 162 mg/dL — ABNORMAL HIGH (ref 70–99)
Glucose-Capillary: 166 mg/dL — ABNORMAL HIGH (ref 70–99)
Glucose-Capillary: 92 mg/dL (ref 70–99)
Glucose-Capillary: 95 mg/dL (ref 70–99)
Glucose-Capillary: 99 mg/dL (ref 70–99)

## 2020-12-15 LAB — COMPREHENSIVE METABOLIC PANEL
ALT: 9 U/L (ref 0–44)
AST: 11 U/L — ABNORMAL LOW (ref 15–41)
Albumin: 3.3 g/dL — ABNORMAL LOW (ref 3.5–5.0)
Alkaline Phosphatase: 70 U/L (ref 38–126)
Anion gap: 6 (ref 5–15)
BUN: 23 mg/dL (ref 8–23)
CO2: 28 mmol/L (ref 22–32)
Calcium: 8.9 mg/dL (ref 8.9–10.3)
Chloride: 102 mmol/L (ref 98–111)
Creatinine, Ser: 1 mg/dL (ref 0.61–1.24)
GFR, Estimated: >60 mL/min (ref >60)
Glucose, Bld: 113 mg/dL — ABNORMAL HIGH (ref 70–99)
Potassium: 3.9 mmol/L (ref 3.5–5.1)
Sodium: 136 mmol/L (ref 135–145)
Total Bilirubin: 0.9 mg/dL (ref 0.3–1.2)
Total Protein: 6.1 g/dL — ABNORMAL LOW (ref 6.5–8.1)

## 2020-12-15 LAB — CBC
HCT: 36.1 % — ABNORMAL LOW (ref 39.0–52.0)
Hemoglobin: 11.3 g/dL — ABNORMAL LOW (ref 13.0–17.0)
MCH: 29.2 pg (ref 26.0–34.0)
MCHC: 31.3 g/dL (ref 30.0–36.0)
MCV: 93.3 fL (ref 80.0–100.0)
Platelets: 243 10*3/uL (ref 150–400)
RBC: 3.87 MIL/uL — ABNORMAL LOW (ref 4.22–5.81)
RDW: 14.2 % (ref 11.5–15.5)
WBC: 7.7 10*3/uL (ref 4.0–10.5)
nRBC: 0 % (ref 0.0–0.2)

## 2020-12-15 LAB — MAGNESIUM: Magnesium: 2.3 mg/dL (ref 1.7–2.4)

## 2020-12-15 NOTE — Progress Notes (Signed)
Foley catheter out at this time per MD order. NAD noted.

## 2020-12-15 NOTE — Progress Notes (Signed)
, PROGRESS NOTE    Jeffery Haynes  FWY:[id number] DOB: 1931/10/30 DOA: 12/12/2020 PCP: Eloisa Northern, MD   Brief Narrative:  87 year old with history of Alzheimer's dementia, paroxysmal A. fib, DM2 CVA, seizures, HTN, BPH comes to the hospital for left upper extremity contraction and somnolence.  He was last seen normal 10 AM on the day of admission.  CT of the head, CTA head and neck was unremarkable.  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  Neurology was consulted, he was started on Keppra and Vimpat.  EEG showed generalized slowing without any obvious seizure activity.  MRI brain showed old CVA but no acute pathology.   Assessment & Plan:   Principal Problem:   Seizure (HCC) Active Problems:   Alzheimer's dementia (HCC)   Essential hypertension   Diabetes mellitus type 2 in nonobese [facility])  Seizure with concerns of postictal state Altered mental status -CT of the head, CTA head and neck negative for acute findings but does show severe stenosis of MCA region with other areas of chronic stenosis and occlusion.  No evidence of infection, continue to hold off antibiotics.  Patient's mentation is improving, suspect secondary to medications?  He is also not showing episodes of delirium and aggressive behavior therefore we will place Haldol as needed - EEG - neg, MRI Brain = show old large CVA.  - Depakote Sprinkle q12hrs. IV vimpat prn.  - Neurology is following.  - Foley placed 10/1; removed 10/3 - TSH, ammonia normal   Essential hypertension -Currently patient is on IV hydralazine as needed - On hold-Norvasc   Paroxysmal atrial fibrillation -Patient is no longer on anticoagulation which was discontinued outpatient by PCP - IV Lopressor as needed  Non-insulin-dependent type II diabetes -A1c-6.9.  Metoprolol on hold - Sliding scale and Accu-Cheks   Alzheimer's dementia Mood disorder BPH -Unable to take any home  medications-donepezil, Celexa    DVT prophylaxis: SCDs Start: 12/13/20 0016 Code Status: DNR Family Communication: Spoke with the wife.   Status is: Inpatient  Remains inpatient appropriate because:Inpatient level of care appropriate due to severity of illness  Dispo: The patient is from: Home              Anticipated d/c is to:  TBD              Patient currently is not medically stable to d/c.   Difficult to place patient No   Subjective: Laying in the bed, no acute events overnight.  Poor oral intake.   Examination:  Constitutional: Not in acute distress. Chronically ill.  Respiratory: Clear to auscultation bilaterally Cardiovascular: Normal sinus rhythm, no rubs Abdomen: Nontender nondistended good bowel sounds Musculoskeletal: No edema noted Skin: No rashes seen Neurologic: CN 2-12 grossly intact.  And nonfocal Psychiatric: Alert Awake x 1-2, poor judgement and insight.    Objective: Vitals:   12/15/20 0400 12/15/20 0738 12/15/20 0857 12/15/20 1148  BP: (!) 175/75  (!) 159/69 (!) 178/70  Pulse: 70  64 60  Resp: 14  14 15   Temp: 98 F (36.7 C)  98 F (36.7 C) 97.7 F (36.5 C)  TempSrc: Axillary  Axillary Axillary  SpO2: 100% 100% 100% 100%    Intake/Output Summary (Last 24 hours) at 12/15/2020 1244 Last data filed at 12/14/2020 2113 Gross per 24 hour  Intake 160 ml  Output 1000 ml  Net -840 ml   There were no vitals filed for this visit.  Data Reviewed:   CBC: Recent Labs  Lab 12/12/20 2211 12/12/20 2257 12/14/20 0205 12/15/20 0158  WBC  --  7.6 7.0 7.7  NEUTROABS  --  5.4  --   --   HGB 13.9 11.3* 10.5* 11.3*  HCT 41.0 35.6* 32.1* 36.1*  MCV  --  93.2 92.0 93.3  PLT  --  263 236 243   Basic Metabolic Panel: Recent Labs  Lab 12/12/20 2211 12/12/20 2257 12/14/20 0205 12/15/20 0158  NA 135 134* 134* 136  K 4.9 4.6 3.9 3.9  CL 102 101 102 102  CO2  --  25 26 28   GLUCOSE 168* 156* 168* 113*  BUN 30* 25* 21 23  CREATININE 0.90 0.93  0.86 1.00  CALCIUM  --  9.2 8.6* 8.9  MG  --   --  1.6* 2.3   GFR: CrCl cannot be calculated (Unknown ideal weight.). Liver Function Tests: Recent Labs  Lab 12/12/20 2257 12/14/20 0205 12/15/20 0158  AST 11* 13* 11*  ALT 11 10 9   ALKPHOS 78 61 70  BILITOT 0.4 0.8 0.9  PROT 6.1* 5.3* 6.1*  ALBUMIN 3.4* 3.0* 3.3*   No results for input(s): LIPASE, AMYLASE in the last 168 hours. Recent Labs  Lab 12/13/20 1309  AMMONIA 28   Coagulation Profile: Recent Labs  Lab 12/12/20 2257  INR 1.0   Cardiac Enzymes: Recent Labs  Lab 12/13/20 0920  CKTOTAL 88   BNP (last 3 results) No results for input(s): PROBNP in the last 8760 hours. HbA1C: Recent Labs    12/13/20 0218  HGBA1C 6.9*   CBG: Recent Labs  Lab 12/14/20 2047 12/15/20 0046 12/15/20 0422 12/15/20 0857 12/15/20 1147  GLUCAP 123* 101* 105* 92 95   Lipid Profile: No results for input(s): CHOL, HDL, LDLCALC, TRIG, CHOLHDL, LDLDIRECT in the last 72 hours. Thyroid Function Tests: Recent Labs    12/13/20 0920  TSH 2.431   Anemia Panel: No results for input(s): VITAMINB12, FOLATE, FERRITIN, TIBC, IRON, RETICCTPCT in the last 72 hours. Sepsis Labs: No results for input(s): PROCALCITON, LATICACIDVEN in the last 168 hours.  Recent Results (from the past 240 hour(s))  Resp Panel by RT-PCR (Flu A&B, Covid) Nasopharyngeal Swab     Status: None   Collection Time: 12/12/20 10:57 PM   Specimen: Nasopharyngeal Swab; Nasopharyngeal(NP) swabs in vial transport medium  Result Value Ref Range Status   SARS Coronavirus 2 by RT PCR NEGATIVE NEGATIVE Final    Comment: (NOTE) SARS-CoV-2 target nucleic acids are NOT DETECTED.  The SARS-CoV-2 RNA is generally detectable in upper respiratory specimens during the acute phase of infection. The lowest concentration of SARS-CoV-2 viral copies this assay can detect is 138 copies/mL. A negative result does not preclude SARS-Cov-2 infection and should not be used as the sole  basis for treatment or other patient management decisions. A negative result may occur with  improper specimen collection/handling, submission of specimen other than nasopharyngeal swab, presence of viral mutation(s) within the areas targeted by this assay, and inadequate number of viral copies(<138 copies/mL). A negative result must be combined with clinical observations, patient history, and epidemiological information. The expected result is Negative.  Fact Sheet for Patients:  02/12/21  Fact Sheet for Healthcare Providers:  12/14/20  This test is no t yet approved or cleared by the [employer] FDA and  has been authorized for detection and/or diagnosis of SARS-CoV-2 by FDA under an Emergency Use Authorization (EUA). This EUA will remain  in effect (meaning this  test can be used) for the duration of the COVID-19 declaration under Section 564(b)(1) of the Act, 21 U.S.C.section 360bbb-3(b)(1), unless the authorization is terminated  or revoked sooner.       Influenza A by PCR NEGATIVE NEGATIVE Final   Influenza B by PCR NEGATIVE NEGATIVE Final    Comment: (NOTE) The Xpert Xpress SARS-CoV-2/FLU/RSV plus assay is intended as an aid in the diagnosis of influenza from Nasopharyngeal swab specimens and should not be used as a sole basis for treatment. Nasal washings and aspirates are unacceptable for Xpert Xpress SARS-CoV-2/FLU/RSV testing.  Fact Sheet for Patients: [employer]com  Fact Sheet for Healthcare Providers: Serious[employer]ker[employer]it  This test is not yet approved or cleared by the [place] FDA and has been authorized for detection and/or diagnosis of SARS-CoV-2 by FDA under an Emergency Use Authorization (EUA). This EUA will remain in effect (meaning this test can be used) for the duration of the COVID-19 declaration under Section 564(b)(1) of the Act,  21 U.S.C. section 360bbb-3(b)(1), unless the authorization is terminated or revoked.  Performed at [facility], [address] [place], [place] [address]   MRSA Next Gen by PCR, Nasal     Status: None   Collection Time: 12/13/20 10:42 PM   Specimen: Nasal Mucosa; Nasal Swab  Result Value Ref Range Status   MRSA by PCR Next Gen NOT DETECTED NOT DETECTED Final    Comment: (NOTE) The GeneXpert MRSA Assay (FDA approved for NASAL specimens only), is one component of a comprehensive MRSA colonization surveillance program. It is not intended to diagnose MRSA infection nor to guide or monitor treatment for MRSA infections. Test performance is not FDA approved in patients less than 41 years old. Performed at [facility], [address] [place], [place] [address]       Radiology Studies: MR BRAIN WO CONTRAST  Result Date: 12/13/2020 CLINICAL DATA:  Neuro deficit, acute, stroke suspected. History of stroke, dementia, and diabetes. EXAM: MRI HEAD WITHOUT CONTRAST TECHNIQUE: Multiplanar, multiecho pulse sequences of the brain and surrounding structures were obtained without intravenous contrast. COMPARISON:  Head CT 12/12/2020 FINDINGS: Brain: There is no evidence of an acute infarct, mass, midline shift, or extra-axial fluid collection. There is a large chronic right ACA infarct with associated chronic blood products. T2 hyperintensities elsewhere in the cerebral white matter bilaterally are nonspecific but compatible with moderate chronic small vessel ischemic disease. Mild chronic small vessel changes are also noted in the pons. There is a chronic microhemorrhage in the mesial right temporal lobe. There is moderate cerebral atrophy. Vascular: Major intracranial vascular flow voids are preserved. Skull and upper cervical spine: Unremarkable bone marrow signal para Sinuses/Orbits: Bilateral cataract extraction. Tiny mucous retention cyst in the right maxillary sinus. Small right  mastoid effusion. Other: None. IMPRESSION: 1. No acute intracranial abnormality. 2. Large chronic right ACA infarct. 3. Moderate chronic small vessel ischemic disease and cerebral atrophy. Electronically Signed   By: Sebastian Ache M.D.   On: 12/13/2020 14:42     Scheduled Meds:  Chlorhexidine Gluconate Cloth  6 each Topical Daily   divalproex  1,000 mg Oral Once   divalproex  500 mg Oral Q12H   insulin aspart  0-6 Units Subcutaneous Q4H   Continuous Infusions:  lacosamide (VIMPAT) IV 50 mg (12/15/20 1013)     LOS: 2 days   Time spent= 35 mins    Chidera Thivierge Joline Maxcy, MD [facility]  If 7PM-7AM, please contact night-coverage  12/15/2020, 12:44 PM

## 2020-12-15 NOTE — Plan of Care (Signed)
  Problem: Health Behavior/Discharge Planning: Goal: Ability to manage health-related needs will improve Outcome: Progressing   Problem: Clinical Measurements: Goal: Ability to maintain clinical measurements within normal limits will improve Outcome: Progressing Goal: Will remain free from infection Outcome: Progressing Goal: Diagnostic test results will improve Outcome: Progressing   

## 2020-12-16 DIAGNOSIS — R569 Unspecified convulsions: Secondary | ICD-10-CM | POA: Diagnosis not present

## 2020-12-16 LAB — COMPREHENSIVE METABOLIC PANEL
ALT: 10 U/L (ref 0–44)
AST: 14 U/L — ABNORMAL LOW (ref 15–41)
Albumin: 3.1 g/dL — ABNORMAL LOW (ref 3.5–5.0)
Alkaline Phosphatase: 71 U/L (ref 38–126)
Anion gap: 9 (ref 5–15)
BUN: 28 mg/dL — ABNORMAL HIGH (ref 8–23)
CO2: 25 mmol/L (ref 22–32)
Calcium: 8.7 mg/dL — ABNORMAL LOW (ref 8.9–10.3)
Chloride: 100 mmol/L (ref 98–111)
Creatinine, Ser: 0.96 mg/dL (ref 0.61–1.24)
GFR, Estimated: >60 mL/min (ref >60)
Glucose, Bld: 110 mg/dL — ABNORMAL HIGH (ref 70–99)
Potassium: 4.2 mmol/L (ref 3.5–5.1)
Sodium: 134 mmol/L — ABNORMAL LOW (ref 135–145)
Total Bilirubin: 0.9 mg/dL (ref 0.3–1.2)
Total Protein: 6.1 g/dL — ABNORMAL LOW (ref 6.5–8.1)

## 2020-12-16 LAB — CBC
HCT: 33.9 % — ABNORMAL LOW (ref 39.0–52.0)
Hemoglobin: 10.8 g/dL — ABNORMAL LOW (ref 13.0–17.0)
MCH: 29.4 pg (ref 26.0–34.0)
MCHC: 31.9 g/dL (ref 30.0–36.0)
MCV: 92.4 fL (ref 80.0–100.0)
Platelets: 235 10*3/uL (ref 150–400)
RBC: 3.67 MIL/uL — ABNORMAL LOW (ref 4.22–5.81)
RDW: 13.4 % (ref 11.5–15.5)
WBC: 7 10*3/uL (ref 4.0–10.5)
nRBC: 0 % (ref 0.0–0.2)

## 2020-12-16 LAB — GLUCOSE, CAPILLARY
Glucose-Capillary: 78 mg/dL (ref 70–99)
Glucose-Capillary: 93 mg/dL (ref 70–99)
Glucose-Capillary: 122 mg/dL — ABNORMAL HIGH (ref 70–99)
Glucose-Capillary: 188 mg/dL — ABNORMAL HIGH (ref 70–99)
Glucose-Capillary: 160 mg/dL — ABNORMAL HIGH (ref 70–99)
Glucose-Capillary: 112 mg/dL — ABNORMAL HIGH (ref 70–99)

## 2020-12-16 LAB — MAGNESIUM: Magnesium: 1.9 mg/dL (ref 1.7–2.4)

## 2020-12-16 LAB — SARS CORONAVIRUS 2 (TAT 6-24 HRS): SARS Coronavirus 2: POSITIVE — AB

## 2020-12-16 MED ORDER — DONEPEZIL HCL 10 MG PO TABS
5.0000 mg | ORAL_TABLET | Freq: Every day | ORAL | Status: DC
  Administered 2020-12-16 – 2020-12-28 (×13): 5 mg via ORAL
  Filled 2020-12-16 (×13): qty 1

## 2020-12-16 MED ORDER — LEVETIRACETAM IN NACL 1000 MG/100ML IV SOLN
1000.0000 mg | Freq: Once | INTRAVENOUS | Status: AC
  Administered 2020-12-16: 1000 mg via INTRAVENOUS
  Filled 2020-12-16: qty 100

## 2020-12-16 MED ORDER — AMLODIPINE BESYLATE 5 MG PO TABS
5.0000 mg | ORAL_TABLET | Freq: Every day | ORAL | Status: DC
  Administered 2020-12-16 – 2020-12-29 (×11): 5 mg via ORAL
  Filled 2020-12-16 (×13): qty 1

## 2020-12-16 MED ORDER — TAMSULOSIN HCL 0.4 MG PO CAPS
0.4000 mg | ORAL_CAPSULE | Freq: Every day | ORAL | Status: DC
  Administered 2020-12-17 – 2020-12-28 (×12): 0.4 mg via ORAL
  Filled 2020-12-16 (×13): qty 1

## 2020-12-16 MED ORDER — SENNOSIDES-DOCUSATE SODIUM 8.6-50 MG PO TABS
2.0000 | ORAL_TABLET | Freq: Two times a day (BID) | ORAL | Status: DC
  Administered 2020-12-16 – 2020-12-29 (×18): 2 via ORAL
  Filled 2020-12-16 (×18): qty 2

## 2020-12-16 MED ORDER — IPRATROPIUM-ALBUTEROL 20-100 MCG/ACT IN AERS
1.0000 | INHALATION_SPRAY | Freq: Four times a day (QID) | RESPIRATORY_TRACT | Status: DC
  Administered 2020-12-16: 1 via RESPIRATORY_TRACT
  Filled 2020-12-16: qty 4

## 2020-12-16 MED ORDER — LEVETIRACETAM IN NACL 500 MG/100ML IV SOLN
500.0000 mg | Freq: Two times a day (BID) | INTRAVENOUS | Status: DC
  Administered 2020-12-17: 500 mg via INTRAVENOUS
  Filled 2020-12-16: qty 100

## 2020-12-16 MED ORDER — OLANZAPINE 2.5 MG PO TABS
2.5000 mg | ORAL_TABLET | ORAL | Status: DC
  Administered 2020-12-16 – 2020-12-27 (×9): 2.5 mg via ORAL
  Filled 2020-12-16 (×10): qty 1

## 2020-12-16 MED ORDER — OLANZAPINE 2.5 MG PO TABS: 2.5000 mg | ORAL_TABLET | ORAL | Status: DC

## 2020-12-16 MED ORDER — CITALOPRAM HYDROBROMIDE 10 MG PO TABS
15.0000 mg | ORAL_TABLET | Freq: Every day | ORAL | Status: DC
  Administered 2020-12-16 – 2020-12-29 (×13): 15 mg via ORAL
  Filled 2020-12-16 (×15): qty 2

## 2020-12-16 MED ORDER — VALPROATE SODIUM 100 MG/ML IV SOLN
500.0000 mg | Freq: Two times a day (BID) | INTRAVENOUS | Status: DC
  Filled 2020-12-16 (×2): qty 5

## 2020-12-16 MED ORDER — POLYETHYLENE GLYCOL 3350 17 G PO PACK
17.0000 g | PACK | Freq: Every day | ORAL | Status: DC
  Administered 2020-12-16 – 2020-12-29 (×6): 17 g via ORAL
  Filled 2020-12-16 (×7): qty 1

## 2020-12-16 NOTE — Progress Notes (Signed)
RT placed pt on cpap of or the night but pt continued to pull mask. Pt remains on RA with O2sats of 100%

## 2020-12-16 NOTE — Progress Notes (Signed)
Placement screening test positive for covid 19. Admission covid screen on neg on 9/30. Will place him on precaution. Supportive care at this time.   Jeffery Haynes

## 2020-12-16 NOTE — NC FL2 (Signed)
[place] MEDICAID FL2 LEVEL OF CARE SCREENING TOOL     IDENTIFICATION  Patient Name: Jeffery Haynes Birthdate: February 21, 1932 Sex: male Admission Date (Current Location): 12/12/2020  [place] and [place] Number:  Haynes Bast [id number] Facility and Address:  [facility], [address] [place], [place] [address]      Provider Number: [id number]  Attending Physician Name and Address:  Dimple Nanas, MD  Relative Name and Phone Number:  RYLAND, TUNGATE [phone]    Current Level of Care: Hospital Recommended Level of Care: Skilled Nursing Facility Prior Approval Number:    Date Approved/Denied:   PASRR Number: [id number]  Discharge Plan: SNF    Current Diagnoses: Patient Active Problem List   Diagnosis Date Noted   Seizure (HCC) 12/13/2020   Alzheimer's dementia (HCC) 06/07/2020   Fracture of femoral neck, left, closed (HCC) 06/07/2020   Closed fracture of left distal femur (HCC) 06/07/2020   Essential hypertension 06/07/2020   Diabetes mellitus type 2 in nonobese (HCC) 06/07/2020   Fracture of femoral neck, left (HCC) 06/07/2020   Acute metabolic encephalopathy 01/29/2020   Atrial fibrillation with RVR (HCC) 01/29/2020   Alzheimer disease (HCC)    Dementia (HCC)     Orientation RESPIRATION BLADDER Height & Weight     Self  Normal External catheter Weight:   Height:     BEHAVIORAL SYMPTOMS/MOOD NEUROLOGICAL BOWEL NUTRITION STATUS  Other (Comment) (Pt can be combative.) Convulsions/Seizures Incontinent Diet (see discharge summary)  AMBULATORY STATUS COMMUNICATION OF NEEDS Skin   Total Care Does not communicate Skin abrasions                       Personal Care Assistance Level of Assistance  Bathing, Feeding, Dressing Bathing Assistance: Maximum assistance Feeding assistance: Limited assistance Dressing Assistance: Maximum assistance     Functional Limitations Info  Sight, Hearing, Speech Sight Info: Adequate Hearing  Info: Adequate Speech Info: Impaired    SPECIAL CARE FACTORS FREQUENCY                       Contractures Contractures Info: Present (left upper extremity)    Additional Factors Info  Code Status, Allergies, Insulin Sliding Scale Code Status Info: DNR Allergies Info: olanzapine   Insulin Sliding Scale Info: novolog, 0-6 units, q4 hours.  See discharge summary       Current Medications (12/16/2020):  This is the current hospital active medication list Current Facility-Administered Medications  Medication Dose Route Frequency Provider Last Rate Last Admin   acetaminophen (TYLENOL) tablet 650 mg  650 mg Oral Q6H PRN John Giovanni, MD       Or   acetaminophen (TYLENOL) suppository 650 mg  650 mg Rectal Q6H PRN John Giovanni, MD       amLODipine (NORVASC) tablet 5 mg  5 mg Oral Daily Amin, Ankit Chirag, MD   5 mg at 12/16/20 0844   Chlorhexidine Gluconate Cloth 2 % PADS 6 each  6 each Topical Daily John Giovanni, MD   6 each at 12/15/20 1005   citalopram (CELEXA) tablet 15 mg  15 mg Oral Daily Amin, Ankit Chirag, MD   15 mg at 12/16/20 0853   divalproex (DEPAKOTE SPRINKLE) capsule 1,000 mg  1,000 mg Oral Once Rejeana Brock, MD       divalproex (DEPAKOTE SPRINKLE) capsule 500 mg  500 mg Oral Q12H Rejeana Brock, MD   500 mg at 12/16/20 0845   donepezil (ARICEPT) tablet  5 mg  5 mg Oral QHS Amin, Ankit Chirag, MD       haloperidol (HALDOL) tablet 2 mg  2 mg Oral Q6H PRN Amin, Ankit Chirag, MD       Or   haloperidol lactate (HALDOL) injection 2 mg  2 mg Intramuscular Q6H PRN Amin, Ankit Chirag, MD       hydrALAZINE (APRESOLINE) injection 10 mg  10 mg Intravenous Q4H PRN Amin, Ankit Chirag, MD       insulin aspart (novoLOG) injection 0-6 Units  0-6 Units Subcutaneous Q4H John Giovanni, MD   1 Units at 12/15/20 2347   ipratropium-albuterol (DUONEB) 0.5-2.5 (3) MG/3ML nebulizer solution 3 mL  3 mL Nebulization Q4H PRN Amin, Ankit Chirag, MD        metoprolol tartrate (LOPRESSOR) injection 5 mg  5 mg Intravenous Q4H PRN Amin, Ankit Chirag, MD       OLANZapine (ZYPREXA) tablet 2.5 mg  2.5 mg Oral Once per day on Mon Tue Wed Thu Sat Amin, Ankit Chirag, MD       polyethylene glycol (MIRALAX / GLYCOLAX) packet 17 g  17 g Oral Daily Amin, Ankit Chirag, MD   17 g at 12/16/20 0846   senna-docusate (Senokot-S) tablet 1 tablet  1 tablet Oral QHS PRN Amin, Ankit Chirag, MD       senna-docusate (Senokot-S) tablet 2 tablet  2 tablet Oral BID Dimple Nanas, MD   2 tablet at 12/16/20 0844   tamsulosin (FLOMAX) capsule 0.4 mg  0.4 mg Oral QHS Amin, Ankit Chirag, MD       traZODone (DESYREL) tablet 50 mg  50 mg Oral QHS PRN Amin, Loura Halt, MD         Discharge Medications: Please see discharge summary for a list of discharge medications.  Relevant Imaging Results:  Relevant Lab Results:   Additional Information SSN: [id number].  Pt is vaccinated for covid with 2 boosters.  Lorri Frederick, LCSW

## 2020-12-16 NOTE — TOC Initial Note (Signed)
Transition of Care [facility]) - Initial/Assessment Note    Patient Details  Name: Jeffery Haynes MRN: [id number] Date of Birth: 1931-06-12  [facility] CM/SW Contact:    Jeffery Frederick, LCSW Phone Number: 12/16/2020, 9:52 AM  Clinical Narrative:  Pt unable to participate in assessment.  CSW spoke with wife Jeffery Haynes who reports pt has been long term care resident at [facility] for the past 3 years.  She would like CSW to see if there are other long term care options for pt.  Discussed that CSW can send out referral to see, unclear DC date at this time.  Pt is vaccinated for covid with both boosters per wife.                  Expected Discharge Plan: Long Term Nursing Home Barriers to Discharge: Continued Medical Work up   Patient Goals and CMS Choice        Expected Discharge Plan and Services Expected Discharge Plan: Long Term Nursing Home In-house Referral: Clinical Social Work   Post Acute Care Choice: Nursing Home Living arrangements for the past 2 months: Skilled Nursing Facility                                      Prior Living Arrangements/Services Living arrangements for the past 2 months: Skilled Nursing Facility Lives with:: Facility Resident Patient language and need for interpreter reviewed:: No        Need for Family Participation in Patient Care: Yes (Comment) Care giver support system in place?: Yes (comment) Current home services: Other (comment) (na) Criminal Activity/Legal Involvement Pertinent to Current Situation/Hospitalization: No - Comment as needed  Activities of Daily Living      Permission Sought/Granted                  Emotional Assessment Appearance:: Appears stated age Attitude/Demeanor/Rapport: Unable to Assess Affect (typically observed): Unable to Assess Orientation: : Oriented to Self Alcohol / Substance Use: Not Applicable Psych Involvement: No (comment)  Admission diagnosis:  Status  epilepticus (HCC) [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] Seizure (HCC) [R56.9] Seizures (HCC) [R56.9] Patient Active Problem List   Diagnosis Date Noted   Seizure (HCC) 12/13/2020   Alzheimer's dementia (HCC) 06/07/2020   Fracture of femoral neck, left, closed (HCC) 06/07/2020   Closed fracture of left distal femur (HCC) 06/07/2020   Essential hypertension 06/07/2020   Diabetes mellitus type 2 in nonobese (HCC) 06/07/2020   Fracture of femoral neck, left (HCC) 06/07/2020   Acute metabolic encephalopathy 01/29/2020   Atrial fibrillation with RVR (HCC) 01/29/2020   Alzheimer disease (HCC)    Dementia (HCC)    PCP:  Jeffery Northern, MD Pharmacy:  No Pharmacies Listed    Social Determinants of Health (SDOH) Interventions    Readmission Risk Interventions No flowsheet data found.

## 2020-12-16 NOTE — Progress Notes (Signed)
Pt pulled condom cath off 2x tonight and pulling at IV site. Placed mittens for protection

## 2020-12-16 NOTE — Progress Notes (Signed)
, PROGRESS NOTE    Jeffery Haynes  RJJ:[id number] DOB: [birth year] DOA: 12/12/2020 PCP: Eloisa Northern, MD   Brief Narrative:  87 year old with history of Alzheimer's dementia, paroxysmal A. fib, DM2 CVA, seizures, HTN, BPH comes to the hospital for left upper extremity contraction and somnolence.  He was last seen normal 10 AM on the day of admission.  CT of the head, CTA head and neck was unremarkable.  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  Neurology was consulted, he was started on Keppra and Vimpat, now on Depakote.  EEG showed generalized slowing without any obvious seizure activity.  MRI brain showed old CVA but no acute pathology.   Assessment & Plan:   Principal Problem:   Seizure (HCC) Active Problems:   Alzheimer's dementia (HCC)   Essential hypertension   Diabetes mellitus type 2 in nonobese [facility])  Seizure with concerns of postictal state Altered mental status -CT of the head, CTA head and neck negative for acute findings but does show severe stenosis of MCA region with other areas of chronic stenosis and occlusion.  No evidence of infection, continue to hold off antibiotics.  Patient's mentation is improving, suspect secondary to medications?  He is also not showing episodes of delirium and aggressive behavior therefore we will place Haldol as needed - EEG - neg, MRI Brain = show old large CVA.  - Depakote Sprinkle q12hrs. IV vimpat prn.  - Neurology is following.  - Foley placed 10/1; Removed 10/3 - TSH, ammonia normal   Essential hypertension -Currently patient is on IV hydralazine as needed - Resume Norvasc   Paroxysmal atrial fibrillation -Patient is no longer on anticoagulation which was discontinued outpatient by PCP - IV Lopressor as needed  Non-insulin-dependent type II diabetes -A1c-6.9.  Metoprolol on hold - Sliding scale and Accu-Cheks   Alzheimer's dementia with behavioral disturbances Mood  disorder BPH - Resume home medications-donepezil, Celexa and Zyprexa    DVT prophylaxis: SCDs Start: 12/13/20 0016 Code Status: DNR Family Communication: Wife Update.   Status is: Inpatient  Remains inpatient appropriate because:Inpatient level of care appropriate due to severity of illness. Drowsy today, hopefully he will stay awake enough over next 24 hrs to consistent take his PO meds. Hopefully dc tomorrow.   Dispo: The patient is from: Home              Anticipated d/c is to:  TBD              Patient currently is not medically stable to d/c.   Difficult to place patient No   Subjective: Combative this morning, spitting at the nursing staff this morning. By the time saw him, he was worn out and resting. Poor PO intake this morning.   Examination:  Constitutional: Wakes up to sternal rub and mainly say his name but does not participate in any conversation. Respiratory: Clear to auscultation bilaterally Cardiovascular: Normal sinus rhythm, no rubs Abdomen: Nontender nondistended good bowel sounds Musculoskeletal: No edema noted Skin: No rashes seen Neurologic: Grossly moving all the extremities Psychiatric: Difficult to assess, he is drowsy.  Objective: Vitals:   12/15/20 2005 12/15/20 2300 12/16/20 0100 12/16/20 0801  BP: (!) 148/69   (!) 171/88  Pulse: 73 71 72 72  Resp: 17 14 (!) 9 18  Temp: (!) 97.1 F (36.2 C)     TempSrc: Axillary     SpO2: 100% 100% 100% 100%    Intake/Output Summary (Last  24 hours) at 12/16/2020 1130 Last data filed at 12/16/2020 0800 Gross per 24 hour  Intake 264 ml  Output 550 ml  Net -286 ml   There were no vitals filed for this visit.   Data Reviewed:   CBC: Recent Labs  Lab 12/12/20 2211 12/12/20 2257 12/14/20 0205 12/15/20 0158 12/16/20 0406  WBC  --  7.6 7.0 7.7 7.0  NEUTROABS  --  5.4  --   --   --   HGB 13.9 11.3* 10.5* 11.3* 10.8*  HCT 41.0 35.6* 32.1* 36.1* 33.9*  MCV  --  93.2 92.0 93.3 92.4  PLT  --  263 236  243 235   Basic Metabolic Panel: Recent Labs  Lab 12/12/20 2211 12/12/20 2257 12/14/20 0205 12/15/20 0158 12/16/20 0217  NA 135 134* 134* 136 134*  K 4.9 4.6 3.9 3.9 4.2  CL 102 101 102 102 100  CO2  --  25 26 28 25   GLUCOSE 168* 156* 168* 113* 110*  BUN 30* 25* 21 23 28*  CREATININE 0.90 0.93 0.86 1.00 0.96  CALCIUM  --  9.2 8.6* 8.9 8.7*  MG  --   --  1.6* 2.3 1.9   GFR: CrCl cannot be calculated (Unknown ideal weight.). Liver Function Tests: Recent Labs  Lab 12/12/20 2257 12/14/20 0205 12/15/20 0158 12/16/20 0217  AST 11* 13* 11* 14*  ALT 11 10 9 10   ALKPHOS 78 61 70 71  BILITOT 0.4 0.8 0.9 0.9  PROT 6.1* 5.3* 6.1* 6.1*  ALBUMIN 3.4* 3.0* 3.3* 3.1*   No results for input(s): LIPASE, AMYLASE in the last 168 hours. Recent Labs  Lab 12/13/20 1309  AMMONIA 28   Coagulation Profile: Recent Labs  Lab 12/12/20 2257  INR 1.0   Cardiac Enzymes: Recent Labs  Lab 12/13/20 0920  CKTOTAL 88   BNP (last 3 results) No results for input(s): PROBNP in the last 8760 hours. HbA1C: No results for input(s): HGBA1C in the last 72 hours.  CBG: Recent Labs  Lab 12/15/20 2004 12/15/20 2344 12/16/20 0413 12/16/20 0803 12/16/20 1109  GLUCAP 166* 162* 78 93 122*   Lipid Profile: No results for input(s): CHOL, HDL, LDLCALC, TRIG, CHOLHDL, LDLDIRECT in the last 72 hours. Thyroid Function Tests: No results for input(s): TSH, T4TOTAL, FREET4, T3FREE, THYROIDAB in the last 72 hours.  Anemia Panel: No results for input(s): VITAMINB12, FOLATE, FERRITIN, TIBC, IRON, RETICCTPCT in the last 72 hours. Sepsis Labs: No results for input(s): PROCALCITON, LATICACIDVEN in the last 168 hours.  Recent Results (from the past 240 hour(s))  Resp Panel by RT-PCR (Flu A&B, Covid) Nasopharyngeal Swab     Status: None   Collection Time: 12/12/20 10:57 PM   Specimen: Nasopharyngeal Swab; Nasopharyngeal(NP) swabs in vial transport medium  Result Value Ref Range Status   SARS  Coronavirus 2 by RT PCR NEGATIVE NEGATIVE Final    Comment: (NOTE) SARS-CoV-2 target nucleic acids are NOT DETECTED.  The SARS-CoV-2 RNA is generally detectable in upper respiratory specimens during the acute phase of infection. The lowest concentration of SARS-CoV-2 viral copies this assay can detect is 138 copies/mL. A negative result does not preclude SARS-Cov-2 infection and should not be used as the sole basis for treatment or other patient management decisions. A negative result may occur with  improper specimen collection/handling, submission of specimen other than nasopharyngeal swab, presence of viral mutation(s) within the areas targeted by this assay, and inadequate number of viral copies(<138 copies/mL). A negative result must be combined with  clinical observations, patient history, and epidemiological information. The expected result is Negative.  Fact Sheet for Patients:  BloggerCourse.com  Fact Sheet for Healthcare Providers:  SeriousBroker.it  This test is no t yet approved or cleared by the [place] FDA and  has been authorized for detection and/or diagnosis of SARS-CoV-2 by FDA under an Emergency Use Authorization (EUA). This EUA will remain  in effect (meaning this test can be used) for the duration of the COVID-19 declaration under Section 564(b)(1) of the Act, 21 U.S.C.section 360bbb-3(b)(1), unless the authorization is terminated  or revoked sooner.       Influenza A by PCR NEGATIVE NEGATIVE Final   Influenza B by PCR NEGATIVE NEGATIVE Final    Comment: (NOTE) The Xpert Xpress SARS-CoV-2/FLU/RSV plus assay is intended as an aid in the diagnosis of influenza from Nasopharyngeal swab specimens and should not be used as a sole basis for treatment. Nasal washings and aspirates are unacceptable for Xpert Xpress SARS-CoV-2/FLU/RSV testing.  Fact Sheet for  Patients: BloggerCourse.com  Fact Sheet for Healthcare Providers: SeriousBroker.it  This test is not yet approved or cleared by the [place] FDA and has been authorized for detection and/or diagnosis of SARS-CoV-2 by FDA under an Emergency Use Authorization (EUA). This EUA will remain in effect (meaning this test can be used) for the duration of the COVID-19 declaration under Section 564(b)(1) of the Act, 21 U.S.C. section 360bbb-3(b)(1), unless the authorization is terminated or revoked.  Performed at [facility], [address], [place], [place] [address]   MRSA Next Gen by PCR, Nasal     Status: None   Collection Time: 12/13/20 10:42 PM   Specimen: Nasal Mucosa; Nasal Swab  Result Value Ref Range Status   MRSA by PCR Next Gen NOT DETECTED NOT DETECTED Final    Comment: (NOTE) The GeneXpert MRSA Assay (FDA approved for NASAL specimens only), is one component of a comprehensive MRSA colonization surveillance program. It is not intended to diagnose MRSA infection nor to guide or monitor treatment for MRSA infections. Test performance is not FDA approved in patients less than 32 years old. Performed at [facility], [address] [place], [place] [address]       Radiology Studies: No results found.   Scheduled Meds:  amLODipine  5 mg Oral Daily   Chlorhexidine Gluconate Cloth  6 each Topical Daily   citalopram  15 mg Oral Daily   divalproex  1,000 mg Oral Once   divalproex  500 mg Oral Q12H   donepezil  5 mg Oral QHS   insulin aspart  0-6 Units Subcutaneous Q4H   OLANZapine  2.5 mg Oral Once per day on Mon Tue Wed Thu Sat   polyethylene glycol  17 g Oral Daily   senna-docusate  2 tablet Oral BID   tamsulosin  0.4 mg Oral QHS   Continuous Infusions:     LOS: 3 days   Time spent= 35 mins    Katurah Karapetian Joline Maxcy, MD [facility]  If 7PM-7AM, please contact night-coverage  12/16/2020,  11:30 AM

## 2020-12-16 NOTE — Plan of Care (Signed)
  Problem: Health Behavior/Discharge Planning: Goal: Ability to manage health-related needs will improve Outcome: Progressing   

## 2020-12-16 NOTE — Evaluation (Signed)
Clinical/Bedside Swallow Evaluation Patient Details  Name: Jeffery Haynes MRN: [id number] Date of Birth: [birth year]  Today's Date: 12/16/2020 Time: SLP Start Time (ACUTE ONLY): 1520 SLP Stop Time (ACUTE ONLY): 1540 SLP Time Calculation (min) (ACUTE ONLY): 20 min  Past Medical History:  Past Medical History:  Diagnosis Date   Alzheimer disease (HCC)    Dementia (HCC)    Diabetes mellitus without complication (HCC)    Stroke [facility])    Past Surgical History: History reviewed. No pertinent surgical history. HPI:  87 year old with history of Alzheimer's dementia, paroxysmal A. fib, DM2 CVA, seizures, HTN, BPH comes to the hospital for left upper extremity contraction and somnolence. CT of the head, CTA head and neck was unremarkable.   EEG showed generalized slowing without any obvious seizure activity.  MRI brain showed old CVA but no acute pathology. Dx seizure, AMS. Swallow evaluated in march of this year with findings generally unremarkable and signficant only for a mild cognitive based dysphagia. He was D/Cd to SNF on a regular diet. His wife at bedside reports no issues with swallowing.    Assessment / Plan / Recommendation  Clinical Impression  Pt presents with functional oropharyngeal swallow with thorough mastication, brisk swallow response, and no s/s of aspiration. His wife and sister were at bedside and he was being fed by his sister.  He did not follow commands nor vocalize during assessment, but demonstrated functional swallowing.  He has an occasional cough at baseline but it was unrelated to PO consumption.  No obvious dysphagia. Recommend continuing current diet; provide careful hand-feeding; crush meds in puree (baseline per family).  No SLP f/u is needed. Our service will sign off. SLP Visit Diagnosis: Dysphagia, oropharyngeal phase (R13.12)    Aspiration Risk  No limitations    Diet Recommendation   Regular solids, thin liquids  Medication Administration: Crushed with  puree    Other  Recommendations Oral Care Recommendations: Oral care BID    Recommendations for follow up therapy are one component of a multi-disciplinary discharge planning process, led by the attending physician.  Recommendations may be updated based on patient status, additional functional criteria and insurance authorization.  Follow up Recommendations None      Frequency and Duration            Prognosis        Swallow Study   General HPI: 87 year old with history of Alzheimer's dementia, paroxysmal A. fib, DM2 CVA, seizures, HTN, BPH comes to the hospital for left upper extremity contraction and somnolence. CT of the head, CTA head and neck was unremarkable.   EEG showed generalized slowing without any obvious seizure activity.  MRI brain showed old CVA but no acute pathology. Dx seizure, AMS. Swallow evaluated in march of this year with findings generally unremarkable and signficant only for a mild cognitive based dysphagia. He was D/Cd to SNF on a regular diet. His wife at bedside reports no issues with swallowing. Type of Study: Bedside Swallow Evaluation Previous Swallow Assessment: see HPI Diet Prior to this Study: Regular;Thin liquids Temperature Spikes Noted: No Respiratory Status: Room air History of Recent Intubation: No Behavior/Cognition: Alert Oral Cavity Assessment: Within Functional Limits Oral Care Completed by SLP: Recent completion by staff Oral Cavity - Dentition: Adequate natural dentition Self-Feeding Abilities: Total assist Patient Positioning: Upright in bed Baseline Vocal Quality: Not observed Volitional Cough: Cognitively unable to elicit Volitional Swallow: Unable to elicit    Oral/Motor/Sensory Function Overall Oral Motor/Sensory Function: Within functional limits   Ice Chips  Ice chips: Within functional limits   Thin Liquid Thin Liquid: Within functional limits    Nectar Thick Nectar Thick Liquid: Not tested   Honey Thick Honey Thick Liquid:  Not tested   Puree Puree: Within functional limits   Solid     Solid: Within functional limits      Blenda Mounts Laurice 12/16/2020,3:48 PM   Marchelle Folks L. Samson Frederic, MA CCC/SLP Acute Rehabilitation Services Office number [phone] Pager [phone]

## 2020-12-16 NOTE — Significant Event (Addendum)
Patient cannot reliably take p.o. medicines I have changed patient's Depakote to IV dose for now.  Addendum -pharmacy notified me that Depakote IV is in backorder and I discussed with on-call neurologist Dr. Otelia Limes who advised to load patient with Keppra 1 g and keep patient on 500 twice daily IV.  Once patient can take p.o. reliably we may have to overlap Depakote with Keppra for 3 days and check Depakote levels before discontinuing Keppra.  Pharmacy notified.   Midge Minium

## 2020-12-17 ENCOUNTER — Inpatient Hospital Stay (HOSPITAL_COMMUNITY): Payer: Medicare Other

## 2020-12-17 DIAGNOSIS — R569 Unspecified convulsions: Secondary | ICD-10-CM | POA: Diagnosis not present

## 2020-12-17 LAB — BASIC METABOLIC PANEL
Anion gap: 6 (ref 5–15)
BUN: 27 mg/dL — ABNORMAL HIGH (ref 8–23)
CO2: 28 mmol/L (ref 22–32)
Calcium: 8.4 mg/dL — ABNORMAL LOW (ref 8.9–10.3)
Chloride: 101 mmol/L (ref 98–111)
Creatinine, Ser: 1.16 mg/dL (ref 0.61–1.24)
GFR, Estimated: >60 mL/min (ref >60)
Glucose, Bld: 108 mg/dL — ABNORMAL HIGH (ref 70–99)
Potassium: 4.2 mmol/L (ref 3.5–5.1)
Sodium: 135 mmol/L (ref 135–145)

## 2020-12-17 LAB — CBC
HCT: 34.8 % — ABNORMAL LOW (ref 39.0–52.0)
Hemoglobin: 11 g/dL — ABNORMAL LOW (ref 13.0–17.0)
MCH: 29.1 pg (ref 26.0–34.0)
MCHC: 31.6 g/dL (ref 30.0–36.0)
MCV: 92.1 fL (ref 80.0–100.0)
Platelets: 237 10*3/uL (ref 150–400)
RBC: 3.78 MIL/uL — ABNORMAL LOW (ref 4.22–5.81)
RDW: 13.6 % (ref 11.5–15.5)
WBC: 6.4 10*3/uL (ref 4.0–10.5)
nRBC: 0 % (ref 0.0–0.2)

## 2020-12-17 LAB — MAGNESIUM: Magnesium: 1.9 mg/dL (ref 1.7–2.4)

## 2020-12-17 LAB — GLUCOSE, CAPILLARY
Glucose-Capillary: 102 mg/dL — ABNORMAL HIGH (ref 70–99)
Glucose-Capillary: 105 mg/dL — ABNORMAL HIGH (ref 70–99)
Glucose-Capillary: 123 mg/dL — ABNORMAL HIGH (ref 70–99)
Glucose-Capillary: 153 mg/dL — ABNORMAL HIGH (ref 70–99)
Glucose-Capillary: 175 mg/dL — ABNORMAL HIGH (ref 70–99)
Glucose-Capillary: 83 mg/dL (ref 70–99)

## 2020-12-17 IMAGING — DX DG CHEST 1V PORT
1 series · 1 of 1 positions shown · non-contrast
Comparison: Portable exam [2X] hours compared to [DATE]

CLINICAL DATA: [2X] positive, code stroke, dementia, diabetes
mellitus

EXAM:
PORTABLE CHEST 1 VIEW

[chest]
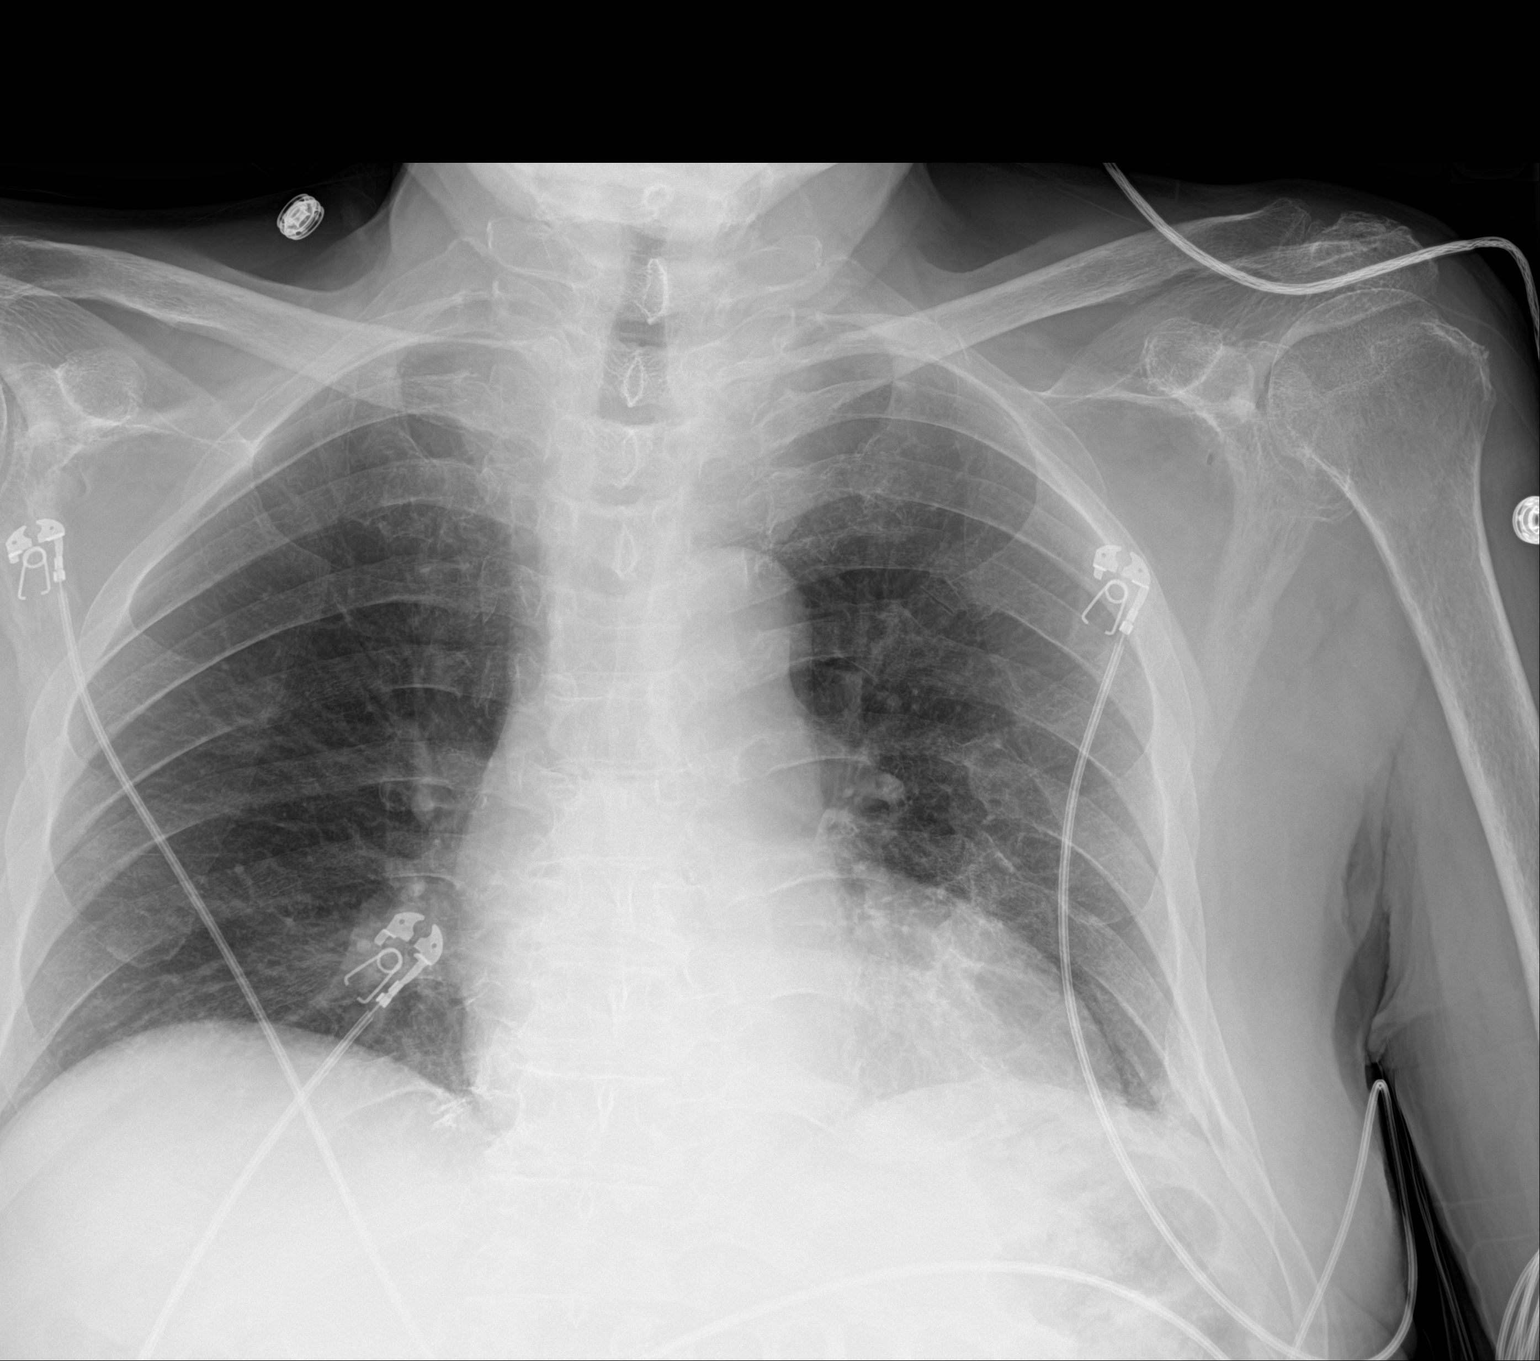

[1 of 1 positions shown; findings below may reference images not displayed]

FINDINGS: Upper normal heart size.

Mediastinal contours and pulmonary vascularity normal.

Lungs clear.

No acute infiltrate, pleural effusion, or pneumothorax.

Opacities at inferior LEFT hemithorax from old healed fractures of
the posterior LEFT fifth sixth seventh eighth and ninth ribs.

Osseous demineralization.
IMPRESSION: No acute abnormalities.

Multiple old LEFT rib fractures.

## 2020-12-17 MED ORDER — ENOXAPARIN SODIUM 40 MG/0.4ML IJ SOSY
40.0000 mg | PREFILLED_SYRINGE | INTRAMUSCULAR | Status: DC
  Administered 2020-12-17 – 2020-12-29 (×9): 40 mg via SUBCUTANEOUS
  Filled 2020-12-17 (×10): qty 0.4

## 2020-12-17 MED ORDER — DIVALPROEX SODIUM 500 MG PO DR TAB
500.0000 mg | DELAYED_RELEASE_TABLET | Freq: Two times a day (BID) | ORAL | Status: DC
  Administered 2020-12-17 – 2020-12-24 (×15): 500 mg via ORAL
  Filled 2020-12-17 (×17): qty 1

## 2020-12-17 MED ORDER — BEBTELOVIMAB 175 MG/2 ML IV (EUA)
175.0000 mg | Freq: Once | INTRAMUSCULAR | Status: AC
  Administered 2020-12-17: 175 mg via INTRAVENOUS
  Filled 2020-12-17 (×2): qty 2

## 2020-12-17 MED ORDER — IPRATROPIUM-ALBUTEROL 20-100 MCG/ACT IN AERS
1.0000 | INHALATION_SPRAY | Freq: Four times a day (QID) | RESPIRATORY_TRACT | Status: DC | PRN
  Administered 2020-12-21 – 2020-12-22 (×2): 1 via RESPIRATORY_TRACT

## 2020-12-17 NOTE — Progress Notes (Signed)
RT note. Patient refuse cpap tonight, cpap is in patients room if he changes his mind. RT will continue to monitor.

## 2020-12-17 NOTE — Plan of Care (Signed)
  Problem: Clinical Measurements: Goal: Respiratory complications will improve Outcome: Progressing Goal: Cardiovascular complication will be avoided Outcome: Progressing   Problem: Health Behavior/Discharge Planning: Goal: Ability to manage health-related needs will improve Outcome: Not Progressing   

## 2020-12-17 NOTE — TOC Progression Note (Signed)
Transition of Care [facility]) - Progression Note    Patient Details  Name: Jeffery Haynes MRN: [id number] Date of Birth: 25-May-1931  [facility]) CM/SW Contact  Lorri Frederick, LCSW Phone Number: 12/17/2020, 10:20 AM  Clinical Narrative: CSW spoke with pt wife Jeffery Haynes, who had already been informed about pt positive covid test.  She is planning to test herself today.  CSW informed [facility] of positive test as well.        Expected Discharge Plan: Long Term Nursing Home Barriers to Discharge: Continued Medical Work up  Expected Discharge Plan and Services Expected Discharge Plan: Long Term Nursing Home In-house Referral: Clinical Social Work   Post Acute Care Choice: Nursing Home Living arrangements for the past 2 months: Skilled Nursing Facility                                       Social Determinants of Health (SDOH) Interventions    Readmission Risk Interventions No flowsheet data found.

## 2020-12-17 NOTE — Progress Notes (Addendum)
, PROGRESS NOTE    Jeffery Haynes  BTD:[id number] DOB: 1931/08/22 DOA: 12/12/2020 PCP: Eloisa Northern, MD   Brief Narrative:  87 year old with history of Alzheimer's dementia, paroxysmal A. fib, DM2 CVA, seizures, HTN, BPH comes to the hospital for left upper extremity contraction and somnolence.  He was last seen normal 10 AM on the day of admission.  CT of the head, CTA head and neck was unremarkable.  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  Neurology was consulted, he was started on Keppra and Vimpat, then changed to Depakote at increased dose.  EEG showed generalized slowing without any obvious seizure activity.  MRI brain showed old CVA but no acute pathology. -10/4, COVID for discharge to SNF came back positive  Subjective -Last night patient refused p.o. meds including antiepileptics, night MD was called, he was started on IV Keppra instead of Depakote -This morning he denies any complaints, agrees to take p.o. meds  Assessment & Plan:  Seizure with concerns of postictal state Encephalopathy -Presented from SNF with worsening mental status and increased tone in his left upper extremity, given known history of seizures this was suspected with postictal state, also due to lack of other inciting events -Followed by neurology this admission, MRI negative, EEG noted generalized slowing -He was on Depakote at baseline but at behavioral control doses, this was increased to 500 mg twice daily -Vimpat was discontinued -Overnight admitting MD ordered IV Keppra as he refused to take meds last night -,Patient is agreeable now, restart Depakote, monitor clinically -Urinalysis, TSH, electrolytes, ammonia level within normal limits  Positive COVID PCR -Yesterday 10/4 COVID screening done for discharge was positive, placed on isolation by Dr. Nelson Chimes yesterday -Remains asymptomatic -will order a dose of bebtelovimab, to decrease risk of  progression -Check chest x-ray and inflammatory markers tomorrow -On Lovenox for prophylaxis   Essential hypertension - Resume Norvasc   Paroxysmal atrial fibrillation -Patient is no longer on anticoagulation which was discontinued outpatient by PCP, likely due to advanced age dementia, fall risk etc.  Non-insulin-dependent type II diabetes -A1c-6.9.  Metformin on hold - Sliding scale and Accu-Cheks   Alzheimer's dementia with behavioral disturbances Mood disorder BPH -Continue home regimen of-donepezil, Celexa and Zyprexa    DVT prophylaxis: Add Lovenox Code Status: DNR Family Communication: No family at bedside, updated spouse [name]  Status is: Inpatient  Remains inpatient appropriate because:Inpatient level of care appropriate due to severity of illness.   Dispo: The patient is from: SNF              Anticipated d/c is to: Back to SNF pending escalation              Patient currently is not medically stable to d/c.   Difficult to place patient No  Examination:  Constitutional: Chronically ill elderly male, sitting up in bed, somnolent but easily arousable, oriented to self and partly to place only, moderate cognitive deficits CVS: S1-S2, regular rate rhythm Lungs: Decreased breath sounds to bases otherwise clear Abdomen: Soft, nontender bowel sounds present Extremities: No edema Skin: No rashes on exposed skin Neuro: Moves all extremities, no localizing signs  Objective: Vitals:   12/17/20 0800 12/17/20 0836 12/17/20 0952 12/17/20 1000  BP: 112/71 120/70 (!) 148/62 139/71  Pulse: (!) 58  65 64  Resp: 15 13 15 16   Temp:  (!) 97.2 F (36.2 C)    TempSrc:  Axillary    SpO2: 100% 100%  100% 100%    Intake/Output Summary (Last 24 hours) at 12/17/2020 1017 Last data filed at 12/17/2020 0454 Gross per 24 hour  Intake --  Output 1450 ml  Net -1450 ml   There were no vitals filed for this visit.   Data Reviewed:   CBC: Recent Labs  Lab 12/12/20 2257  12/14/20 0205 12/15/20 0158 12/16/20 0406 12/17/20 0210  WBC 7.6 7.0 7.7 7.0 6.4  NEUTROABS 5.4  --   --   --   --   HGB 11.3* 10.5* 11.3* 10.8* 11.0*  HCT 35.6* 32.1* 36.1* 33.9* 34.8*  MCV 93.2 92.0 93.3 92.4 92.1  PLT 263 236 243 235 237   Basic Metabolic Panel: Recent Labs  Lab 12/12/20 2257 12/14/20 0205 12/15/20 0158 12/16/20 0217 12/17/20 0210  NA 134* 134* 136 134* 135  K 4.6 3.9 3.9 4.2 4.2  CL 101 102 102 100 101  CO2 25 26 28 25 28   GLUCOSE 156* 168* 113* 110* 108*  BUN 25* 21 23 28* 27*  CREATININE 0.93 0.86 1.00 0.96 1.16  CALCIUM 9.2 8.6* 8.9 8.7* 8.4*  MG  --  1.6* 2.3 1.9 1.9   GFR: CrCl cannot be calculated (Unknown ideal weight.). Liver Function Tests: Recent Labs  Lab 12/12/20 2257 12/14/20 0205 12/15/20 0158 12/16/20 0217  AST 11* 13* 11* 14*  ALT 11 10 9 10   ALKPHOS 78 61 70 71  BILITOT 0.4 0.8 0.9 0.9  PROT 6.1* 5.3* 6.1* 6.1*  ALBUMIN 3.4* 3.0* 3.3* 3.1*   No results for input(s): LIPASE, AMYLASE in the last 168 hours. Recent Labs  Lab 12/13/20 1309  AMMONIA 28   Coagulation Profile: Recent Labs  Lab 12/12/20 2257  INR 1.0   Cardiac Enzymes: Recent Labs  Lab 12/13/20 0920  CKTOTAL 88   BNP (last 3 results) No results for input(s): PROBNP in the last 8760 hours. HbA1C: No results for input(s): HGBA1C in the last 72 hours.  CBG: Recent Labs  Lab 12/16/20 1910 12/16/20 2059 12/17/20 0024 12/17/20 0439 12/17/20 0835  GLUCAP 160* 112* 83 105* 102*   Lipid Profile: No results for input(s): CHOL, HDL, LDLCALC, TRIG, CHOLHDL, LDLDIRECT in the last 72 hours. Thyroid Function Tests: No results for input(s): TSH, T4TOTAL, FREET4, T3FREE, THYROIDAB in the last 72 hours.  Anemia Panel: No results for input(s): VITAMINB12, FOLATE, FERRITIN, TIBC, IRON, RETICCTPCT in the last 72 hours. Sepsis Labs: No results for input(s): PROCALCITON, LATICACIDVEN in the last 168 hours.  Recent Results (from the past 240 hour(s))  Resp  Panel by RT-PCR (Flu A&B, Covid) Nasopharyngeal Swab     Status: None   Collection Time: 12/12/20 10:57 PM   Specimen: Nasopharyngeal Swab; Nasopharyngeal(NP) swabs in vial transport medium  Result Value Ref Range Status   SARS Coronavirus 2 by RT PCR NEGATIVE NEGATIVE Final    Comment: (NOTE) SARS-CoV-2 target nucleic acids are NOT DETECTED.  The SARS-CoV-2 RNA is generally detectable in upper respiratory specimens during the acute phase of infection. The lowest concentration of SARS-CoV-2 viral copies this assay can detect is 138 copies/mL. A negative result does not preclude SARS-Cov-2 infection and should not be used as the sole basis for treatment or other patient management decisions. A negative result may occur with  improper specimen collection/handling, submission of specimen other than nasopharyngeal swab, presence of viral mutation(s) within the areas targeted by this assay, and inadequate number of viral copies(<138 copies/mL). A negative result must be combined with clinical observations, patient history, and  epidemiological information. The expected result is Negative.  Fact Sheet for Patients:  BloggerCourse.com  Fact Sheet for Healthcare Providers:  SeriousBroker.it  This test is no t yet approved or cleared by the [place] FDA and  has been authorized for detection and/or diagnosis of SARS-CoV-2 by FDA under an Emergency Use Authorization (EUA). This EUA will remain  in effect (meaning this test can be used) for the duration of the COVID-19 declaration under Section 564(b)(1) of the Act, 21 U.S.C.section 360bbb-3(b)(1), unless the authorization is terminated  or revoked sooner.       Influenza A by PCR NEGATIVE NEGATIVE Final   Influenza B by PCR NEGATIVE NEGATIVE Final    Comment: (NOTE) The Xpert Xpress SARS-CoV-2/FLU/RSV plus assay is intended as an aid in the diagnosis of influenza from Nasopharyngeal  swab specimens and should not be used as a sole basis for treatment. Nasal washings and aspirates are unacceptable for Xpert Xpress SARS-CoV-2/FLU/RSV testing.  Fact Sheet for Patients: BloggerCourse.com  Fact Sheet for Healthcare Providers: SeriousBroker.it  This test is not yet approved or cleared by the [place] FDA and has been authorized for detection and/or diagnosis of SARS-CoV-2 by FDA under an Emergency Use Authorization (EUA). This EUA will remain in effect (meaning this test can be used) for the duration of the COVID-19 declaration under Section 564(b)(1) of the Act, 21 U.S.C. section 360bbb-3(b)(1), unless the authorization is terminated or revoked.  Performed at [facility], [address], [place], [place] [address]   MRSA Next Gen by PCR, Nasal     Status: None   Collection Time: 12/13/20 10:42 PM   Specimen: Nasal Mucosa; Nasal Swab  Result Value Ref Range Status   MRSA by PCR Next Gen NOT DETECTED NOT DETECTED Final    Comment: (NOTE) The GeneXpert MRSA Assay (FDA approved for NASAL specimens only), is one component of a comprehensive MRSA colonization surveillance program. It is not intended to diagnose MRSA infection nor to guide or monitor treatment for MRSA infections. Test performance is not FDA approved in patients less than 64 years old. Performed at [facility], [address], [place], [place] [address]   SARS CORONAVIRUS 2 (TAT 6-24 HRS) Nasopharyngeal Nasopharyngeal Swab     Status: Abnormal   Collection Time: 12/16/20 12:58 PM   Specimen: Nasopharyngeal Swab  Result Value Ref Range Status   SARS Coronavirus 2 POSITIVE (A) NEGATIVE Final    Comment: (NOTE) SARS-CoV-2 target nucleic acids are DETECTED.  The SARS-CoV-2 RNA is generally detectable in upper and lower respiratory specimens during the acute phase of infection. Positive results are indicative of the presence of  SARS-CoV-2 RNA. Clinical correlation with patient history and other diagnostic information is  necessary to determine patient infection status. Positive results do not rule out bacterial infection or co-infection with other viruses.  The expected result is Negative.  Fact Sheet for Patients: HairSlick.no  Fact Sheet for Healthcare Providers: quierodirigir.com  This test is not yet approved or cleared by the [place] FDA and  has been authorized for detection and/or diagnosis of SARS-CoV-2 by FDA under an Emergency Use Authorization (EUA). This EUA will remain  in effect (meaning this test can be used) for the duration of the COVID-19 declaration under Section 564(b)(1) of the Act, 21 U. S.C. section 360bbb-3(b)(1), unless the authorization is terminated or revoked sooner.   Performed at [facility], [address] [place], [place] [address]  Radiology Studies: No results found.   Scheduled Meds:  amLODipine  5 mg Oral Daily   Chlorhexidine Gluconate Cloth  6 each Topical Daily   citalopram  15 mg Oral Daily   divalproex  500 mg Oral Q12H   donepezil  5 mg Oral QHS   insulin aspart  0-6 Units Subcutaneous Q4H   OLANZapine  2.5 mg Oral Once per day on Mon Tue Wed Thu Sat   polyethylene glycol  17 g Oral Daily   senna-docusate  2 tablet Oral BID   tamsulosin  0.4 mg Oral QHS   Continuous Infusions:     LOS: 4 days   Time spent= 35 mins    Zannie Cove, MD [facility]  If 7PM-7AM, please contact night-coverage  12/17/2020, 10:17 AM

## 2020-12-18 DIAGNOSIS — G309 Alzheimer's disease, unspecified: Secondary | ICD-10-CM | POA: Diagnosis not present

## 2020-12-18 DIAGNOSIS — U071 COVID-19: Secondary | ICD-10-CM | POA: Diagnosis not present

## 2020-12-18 DIAGNOSIS — E119 Type 2 diabetes mellitus without complications: Secondary | ICD-10-CM | POA: Diagnosis not present

## 2020-12-18 DIAGNOSIS — R569 Unspecified convulsions: Secondary | ICD-10-CM | POA: Diagnosis not present

## 2020-12-18 DIAGNOSIS — F02C3 Dementia in other diseases classified elsewhere, severe, with mood disturbance: Secondary | ICD-10-CM

## 2020-12-18 DIAGNOSIS — I10 Essential (primary) hypertension: Secondary | ICD-10-CM

## 2020-12-18 LAB — GLUCOSE, CAPILLARY
Glucose-Capillary: 104 mg/dL — ABNORMAL HIGH (ref 70–99)
Glucose-Capillary: 111 mg/dL — ABNORMAL HIGH (ref 70–99)
Glucose-Capillary: 119 mg/dL — ABNORMAL HIGH (ref 70–99)
Glucose-Capillary: 149 mg/dL — ABNORMAL HIGH (ref 70–99)
Glucose-Capillary: 231 mg/dL — ABNORMAL HIGH (ref 70–99)
Glucose-Capillary: 99 mg/dL (ref 70–99)

## 2020-12-18 LAB — CBC
HCT: 33.9 % — ABNORMAL LOW (ref 39.0–52.0)
Hemoglobin: 10.9 g/dL — ABNORMAL LOW (ref 13.0–17.0)
MCH: 29.5 pg (ref 26.0–34.0)
MCHC: 32.2 g/dL (ref 30.0–36.0)
MCV: 91.6 fL (ref 80.0–100.0)
Platelets: 227 10*3/uL (ref 150–400)
RBC: 3.7 MIL/uL — ABNORMAL LOW (ref 4.22–5.81)
RDW: 13.4 % (ref 11.5–15.5)
WBC: 6.1 10*3/uL (ref 4.0–10.5)
nRBC: 0 % (ref 0.0–0.2)

## 2020-12-18 LAB — BASIC METABOLIC PANEL
Anion gap: 7 (ref 5–15)
BUN: 29 mg/dL — ABNORMAL HIGH (ref 8–23)
CO2: 28 mmol/L (ref 22–32)
Calcium: 8.7 mg/dL — ABNORMAL LOW (ref 8.9–10.3)
Chloride: 102 mmol/L (ref 98–111)
Creatinine, Ser: 1.08 mg/dL (ref 0.61–1.24)
GFR, Estimated: >60 mL/min (ref >60)
Glucose, Bld: 108 mg/dL — ABNORMAL HIGH (ref 70–99)
Potassium: 4.5 mmol/L (ref 3.5–5.1)
Sodium: 137 mmol/L (ref 135–145)

## 2020-12-18 LAB — C-REACTIVE PROTEIN: CRP: 0.5 mg/dL (ref <1.0)

## 2020-12-18 LAB — FERRITIN: Ferritin: 76 ng/mL (ref 24–336)

## 2020-12-18 LAB — D-DIMER, QUANTITATIVE: D-Dimer, Quant: 1.13 ug/mL-FEU — ABNORMAL HIGH (ref 0.00–0.50)

## 2020-12-18 NOTE — Progress Notes (Signed)
[facility]  PROGRESS NOTE  Jeffery Haynes WUJ:[id number] DOB: 04/13/1931 DOA: 12/12/2020 PCP: Eloisa Northern, MD   Brief HPI:   87 year old male with a history of Alzheimer's dementia, paroxysmal atrial fibrillation, diabetes mellitus type 2, CVA, seizures, hypertension, BPH came to hospital for left upper extremity contraction and somnolence.  CT of the head, CTA head and neck was unremarkable.  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  Neurology was consulted and he was started on Keppra and Vimpat, then changed to Depakote at increased dose.  EEG showed generalized slowing without any obvious seizure activity.  MRI brain showed old CVA but no acute pathology. On 12/16/2020 COVID test came back positive.    Subjective   Patient seen and examined, has been violent with staff this morning.  Does not open eyes on verbal or tactile stimuli.  On painful stimuli becomes agitated and tries to hit with fist.   Assessment/Plan:     Seizure with concern for postictal state/encephalopathy -Presented from skilled nursing facility with altered mental status, increased tone left upper extremity -Given history of seizures this was suspected to be a postictal state -Seen by neurology, MRI was negative, EEG noted generalized slowing -He was on Depakote at baseline but at behavioral control doses, this was increased to 500 mg twice daily -Vimpat was discontinued -Urinalysis, TSH, ammonia level within normal limits  Positive COVID PCR -COVID screening on 12/16/2020 was positive for COVID -Patient is asymptomatic; not requiring oxygen -He was given 1 dose of bebtelovimab, to decrease risk of progression -D-dimer 1.13, CRP 0.5  Hypertension -Continue Norvasc  Paroxysmal atrial fibrillation -Patient is no longer on anticoagulation -Was discontinued outpatient by PCP due to advanced age/dementia/fall risk  Diabetes mellitus type  2 -Hemoglobin A1c is 6.9 -Metformin on hold -Continue sliding scale insulin with NovoLog -CBG well controlled  Alzheimer's dementia with behavior disturbance -Continue donepezil, Depakote, olanzapine  BPH -Continue tamsulosin       Scheduled medications:    amLODipine  5 mg Oral Daily   Chlorhexidine Gluconate Cloth  6 each Topical Daily   citalopram  15 mg Oral Daily   divalproex  500 mg Oral Q12H   donepezil  5 mg Oral QHS   enoxaparin (LOVENOX) injection  40 mg Subcutaneous Q24H   insulin aspart  0-6 Units Subcutaneous Q4H   OLANZapine  2.5 mg Oral Once per day on Mon Tue Wed Thu Sat   polyethylene glycol  17 g Oral Daily   senna-docusate  2 tablet Oral BID   tamsulosin  0.4 mg Oral QHS     Data Reviewed:   CBG:  Recent Labs  Lab 12/17/20 2029 12/18/20 0008 12/18/20 0430 12/18/20 0859 12/18/20 1357  GLUCAP 175* 111* 104* 99 231*    SpO2: 100 % O2 Flow Rate (L/min): 2 L/min    Vitals:   12/18/20 0009 12/18/20 0300 12/18/20 0857 12/18/20 0900  BP: (!) 140/59 (!) 143/54  (!) 175/76  Pulse: 60 60    Resp: 15     Temp: 98.7 F (37.1 C) 98.1 F (36.7 C) (!) 97.4 F (36.3 C)   TempSrc:  Oral Axillary   SpO2:         Intake/Output Summary (Last 24 hours) at 12/18/2020 1604 Last data filed at 12/18/2020 0900 Gross per 24 hour  Intake 120 ml  Output 300 ml  Net -180 ml    10/04 1901 - 10/06 0700 In: 600 [P.O.:600]  Out: 2050 [Urine:2050]  There were no vitals filed for this visit.  Data Reviewed: Basic Metabolic Panel: Recent Labs  Lab 12/14/20 0205 12/15/20 0158 12/16/20 0217 12/17/20 0210 12/18/20 0239  NA 134* 136 134* 135 137  K 3.9 3.9 4.2 4.2 4.5  CL 102 102 100 101 102  CO2 26 28 25 28 28   GLUCOSE 168* 113* 110* 108* 108*  BUN 21 23 28* 27* 29*  CREATININE 0.86 1.00 0.96 1.16 1.08  CALCIUM 8.6* 8.9 8.7* 8.4* 8.7*  MG 1.6* 2.3 1.9 1.9  --    Liver Function Tests: Recent Labs  Lab 12/12/20 2257 12/14/20 0205  12/15/20 0158 12/16/20 0217  AST 11* 13* 11* 14*  ALT 11 10 9 10   ALKPHOS 78 61 70 71  BILITOT 0.4 0.8 0.9 0.9  PROT 6.1* 5.3* 6.1* 6.1*  ALBUMIN 3.4* 3.0* 3.3* 3.1*   No results for input(s): LIPASE, AMYLASE in the last 168 hours. Recent Labs  Lab 12/13/20 1309  AMMONIA 28   CBC: Recent Labs  Lab 12/12/20 2257 12/14/20 0205 12/15/20 0158 12/16/20 0406 12/17/20 0210 12/18/20 0239  WBC 7.6 7.0 7.7 7.0 6.4 6.1  NEUTROABS 5.4  --   --   --   --   --   HGB 11.3* 10.5* 11.3* 10.8* 11.0* 10.9*  HCT 35.6* 32.1* 36.1* 33.9* 34.8* 33.9*  MCV 93.2 92.0 93.3 92.4 92.1 91.6  PLT 263 236 243 235 237 227   Cardiac Enzymes: Recent Labs  Lab 12/13/20 0920  CKTOTAL 88   BNP (last 3 results) No results for input(s): BNP in the last 8760 hours.  ProBNP (last 3 results) No results for input(s): PROBNP in the last 8760 hours.  CBG: Recent Labs  Lab 12/17/20 2029 12/18/20 0008 12/18/20 0430 12/18/20 0859 12/18/20 1357  GLUCAP 175* 111* 104* 99 231*       Radiology Reports  DG CHEST PORT 1 VIEW  Result Date: 12/17/2020 CLINICAL DATA:  COVID-19 positive, code stroke, dementia, diabetes mellitus EXAM: PORTABLE CHEST 1 VIEW COMPARISON:  Portable exam 1048 hours compared to 06/17/2020 FINDINGS: Upper normal heart size. Mediastinal contours and pulmonary vascularity normal. Lungs clear. No acute infiltrate, pleural effusion, or pneumothorax. Opacities at inferior LEFT hemithorax from old healed fractures of the posterior LEFT fifth sixth seventh eighth and ninth ribs. Osseous demineralization. IMPRESSION: No acute abnormalities. Multiple old LEFT rib fractures. Electronically Signed   By: 02/16/2021 M.D.   On: 12/17/2020 13:31       Antibiotics: Anti-infectives (From admission, onward)    None         DVT prophylaxis: Lovenox  Code Status: Full code  Family Communication: No family at bedside   Consultants:   Procedures:     Objective    Physical  Examination:  General-somnolent, did not open eyes to verbal or tactile stimuli Heart-S1-S2, regular, no murmur auscultated Lungs-clear to auscultation bilaterally, no wheezing or crackles auscultated Abdomen-soft, nontender, no organomegaly Extremities-no edema in the lower extremities Neuro-alert, moving all extremities   Status is: Inpatient  Dispo: The patient is from: SNF              Anticipated d/c is to: SNF              Anticipated d/c date is: 12/26/2020              Patient currently not stable for discharge  Barrier to discharge-awaiting isolation for 10 days for COVID 19  COVID-19 Labs  Recent Labs    12/18/20 0239  DDIMER 1.13*  FERRITIN 76  CRP 0.5    Lab Results  Component Value Date   SARSCOV2NAA POSITIVE (A) 12/16/2020   SARSCOV2NAA NEGATIVE 12/12/2020   SARSCOV2NAA NEGATIVE 06/10/2020   SARSCOV2NAA NEGATIVE 06/06/2020            Recent Results (from the past 240 hour(s))  Resp Panel by RT-PCR (Flu A&B, Covid) Nasopharyngeal Swab     Status: None   Collection Time: 12/12/20 10:57 PM   Specimen: Nasopharyngeal Swab; Nasopharyngeal(NP) swabs in vial transport medium  Result Value Ref Range Status   SARS Coronavirus 2 by RT PCR NEGATIVE NEGATIVE Final    Comment: (NOTE) SARS-CoV-2 target nucleic acids are NOT DETECTED.  The SARS-CoV-2 RNA is generally detectable in upper respiratory specimens during the acute phase of infection. The lowest concentration of SARS-CoV-2 viral copies this assay can detect is 138 copies/mL. A negative result does not preclude SARS-Cov-2 infection and should not be used as the sole basis for treatment or other patient management decisions. A negative result may occur with  improper specimen collection/handling, submission of specimen other than nasopharyngeal swab, presence of viral mutation(s) within the areas targeted by this assay, and inadequate number of viral copies(<138 copies/mL). A negative result must  be combined with clinical observations, patient history, and epidemiological information. The expected result is Negative.  Fact Sheet for Patients:  BloggerCourse.com  Fact Sheet for Healthcare Providers:  SeriousBroker.it  This test is no t yet approved or cleared by the [place] FDA and  has been authorized for detection and/or diagnosis of SARS-CoV-2 by FDA under an Emergency Use Authorization (EUA). This EUA will remain  in effect (meaning this test can be used) for the duration of the COVID-19 declaration under Section 564(b)(1) of the Act, 21 U.S.C.section 360bbb-3(b)(1), unless the authorization is terminated  or revoked sooner.       Influenza A by PCR NEGATIVE NEGATIVE Final   Influenza B by PCR NEGATIVE NEGATIVE Final    Comment: (NOTE) The Xpert Xpress SARS-CoV-2/FLU/RSV plus assay is intended as an aid in the diagnosis of influenza from Nasopharyngeal swab specimens and should not be used as a sole basis for treatment. Nasal washings and aspirates are unacceptable for Xpert Xpress SARS-CoV-2/FLU/RSV testing.  Fact Sheet for Patients: BloggerCourse.com  Fact Sheet for Healthcare Providers: SeriousBroker.it  This test is not yet approved or cleared by the [place] FDA and has been authorized for detection and/or diagnosis of SARS-CoV-2 by FDA under an Emergency Use Authorization (EUA). This EUA will remain in effect (meaning this test can be used) for the duration of the COVID-19 declaration under Section 564(b)(1) of the Act, 21 U.S.C. section 360bbb-3(b)(1), unless the authorization is terminated or revoked.  Performed at [facility], [address], [place], [place] [address]   MRSA Next Gen by PCR, Nasal     Status: None   Collection Time: 12/13/20 10:42 PM   Specimen: Nasal Mucosa; Nasal Swab  Result Value Ref Range Status   MRSA by PCR  Next Gen NOT DETECTED NOT DETECTED Final    Comment: (NOTE) The GeneXpert MRSA Assay (FDA approved for NASAL specimens only), is one component of a comprehensive MRSA colonization surveillance program. It is not intended to diagnose MRSA infection nor to guide or monitor treatment for MRSA infections. Test performance is not FDA approved in patients less than 60 years old. Performed at [facility], [address],  [place], [place] [address]   SARS CORONAVIRUS 2 (TAT 6-24 HRS) Nasopharyngeal Nasopharyngeal Swab     Status: Abnormal   Collection Time: 12/16/20 12:58 PM   Specimen: Nasopharyngeal Swab  Result Value Ref Range Status   SARS Coronavirus 2 POSITIVE (A) NEGATIVE Final    Comment: (NOTE) SARS-CoV-2 target nucleic acids are DETECTED.  The SARS-CoV-2 RNA is generally detectable in upper and lower respiratory specimens during the acute phase of infection. Positive results are indicative of the presence of SARS-CoV-2 RNA. Clinical correlation with patient history and other diagnostic information is  necessary to determine patient infection status. Positive results do not rule out bacterial infection or co-infection with other viruses.  The expected result is Negative.  Fact Sheet for Patients: HairSlick.no  Fact Sheet for Healthcare Providers: quierodirigir.com  This test is not yet approved or cleared by the [place] FDA and  has been authorized for detection and/or diagnosis of SARS-CoV-2 by FDA under an Emergency Use Authorization (EUA). This EUA will remain  in effect (meaning this test can be used) for the duration of the COVID-19 declaration under Section 564(b)(1) of the Act, 21 U. S.C. section 360bbb-3(b)(1), unless the authorization is terminated or revoked sooner.   Performed at [facility], [address] [place], [place] [address]     Meredeth Ide   [facility] If 7PM-7AM,  please contact night-coverage at [email or web address], Office  [phone]   12/18/2020, 4:04 PM  LOS: 5 days

## 2020-12-18 NOTE — Progress Notes (Signed)
Pt is very violent with patient care: kicking, biting, spitting and hitting at staff. Pt is very combative and yells at staff. Scored 14 on violent flow sheet. Pt spitted in NT face and on gown, d/t Covid precaution face shield was in place and protected staff from bodily fluids. Pt has dementia , but also educated on respecting others. He calmed down briefly and allowed staff to finish care. Call light light in reach and safety measures intact.

## 2020-12-18 NOTE — Progress Notes (Signed)
RT note. Patient refuse cpap for the night. CPAP is in patients room if he decides to change his mind. RT will continue to monitor.

## 2020-12-19 DIAGNOSIS — R569 Unspecified convulsions: Secondary | ICD-10-CM | POA: Diagnosis not present

## 2020-12-19 LAB — GLUCOSE, CAPILLARY
Glucose-Capillary: 117 mg/dL — ABNORMAL HIGH (ref 70–99)
Glucose-Capillary: 136 mg/dL — ABNORMAL HIGH (ref 70–99)
Glucose-Capillary: 156 mg/dL — ABNORMAL HIGH (ref 70–99)
Glucose-Capillary: 158 mg/dL — ABNORMAL HIGH (ref 70–99)
Glucose-Capillary: 78 mg/dL (ref 70–99)
Glucose-Capillary: 91 mg/dL (ref 70–99)

## 2020-12-19 LAB — BASIC METABOLIC PANEL
Anion gap: 6 (ref 5–15)
BUN: 33 mg/dL — ABNORMAL HIGH (ref 8–23)
CO2: 29 mmol/L (ref 22–32)
Calcium: 8.7 mg/dL — ABNORMAL LOW (ref 8.9–10.3)
Chloride: 103 mmol/L (ref 98–111)
Creatinine, Ser: 1.22 mg/dL (ref 0.61–1.24)
GFR, Estimated: 57 mL/min — ABNORMAL LOW (ref >60)
Glucose, Bld: 123 mg/dL — ABNORMAL HIGH (ref 70–99)
Potassium: 4.5 mmol/L (ref 3.5–5.1)
Sodium: 138 mmol/L (ref 135–145)

## 2020-12-19 NOTE — Progress Notes (Signed)
[facility]  PROGRESS NOTE  Jeffery Haynes ZDG:[id number] DOB: December 07, 1931 DOA: 12/12/2020 PCP: Eloisa Northern, MD   Brief HPI:   87 year old male with a history of Alzheimer's dementia, paroxysmal atrial fibrillation, diabetes mellitus type 2, CVA, seizures, hypertension, BPH came to hospital for left upper extremity contraction and somnolence.  CT of the head, CTA head and neck was unremarkable.  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  Neurology was consulted and he was started on Keppra and Vimpat, then changed to Depakote at increased dose.  EEG showed generalized slowing without any obvious seizure activity.  MRI brain showed old CVA but no acute pathology. On 12/16/2020 COVID test came back positive-- will need 10 days of quarantine.      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 -Presented from skilled nursing facility with altered mental status, increased tone left upper extremity -Given history of seizures this was suspected to be a postictal state -Seen by neurology, MRI was negative, EEG noted generalized slowing -He was on Depakote at baseline but at behavioral control doses, this was increased to 500 mg twice daily -Vimpat was discontinued -Urinalysis, TSH, ammonia level within normal limits  Positive COVID PCR -COVID screening on 12/16/2020 was positive for COVID -Patient is asymptomatic; not requiring oxygen -He was given 1 dose of bebtelovimab, to decrease risk of progression  Hypertension -Continue Norvasc  Paroxysmal atrial fibrillation -Patient is no longer on anticoagulation -Was discontinued outpatient by PCP due to advanced age/dementia/fall risk  Diabetes mellitus type 2 -Hemoglobin A1c is 6.9 -Metformin on hold -Continue sliding scale insulin with NovoLog -CBG well controlled  Alzheimer's dementia with  behavior disturbance -Continue donepezil, Depakote, olanzapine  BPH -Continue tamsulosin       Scheduled medications:    amLODipine  5 mg Oral Daily   Chlorhexidine Gluconate Cloth  6 each Topical Daily   citalopram  15 mg Oral Daily   divalproex  500 mg Oral Q12H   donepezil  5 mg Oral QHS   enoxaparin (LOVENOX) injection  40 mg Subcutaneous Q24H   insulin aspart  0-6 Units Subcutaneous Q4H   OLANZapine  2.5 mg Oral Once per day on Mon Tue Wed Thu Sat   polyethylene glycol  17 g Oral Daily   senna-docusate  2 tablet Oral BID   tamsulosin  0.4 mg Oral QHS     Data Reviewed:   CBG:  Recent Labs  Lab 12/18/20 1727 12/18/20 2106 12/19/20 0041 12/19/20 0551 12/19/20 0818  GLUCAP 149* 119* 117* 91 78    SpO2: 99 % O2 Flow Rate (L/min): 2 L/min    Vitals:   12/19/20 0038 12/19/20 0553 12/19/20 0800 12/19/20 1100  BP: 125/64 133/60 138/62   Pulse: 69 62 (!) 53 (!) 55  Resp: 17 12 13 14   Temp: 98.7 F (37.1 C)  98 F (36.7 C)   TempSrc: Oral  Oral   SpO2: 100% 100% 96% 99%     Intake/Output Summary (Last 24 hours) at 12/19/2020 1148 Last data filed at 12/18/2020 1800 Gross per 24 hour  Intake 480 ml  Output 350 ml  Net 130 ml    10/05 1901 - 10/07 0700 In: 780 [P.O.:780] Out: 600 [Urine:600]  There were no vitals filed for this visit.  Data Reviewed: Basic Metabolic Panel: Recent Labs  Lab 12/14/20 0205 12/15/20 0158 12/16/20 0217 12/17/20 0210  12/18/20 0239 12/19/20 0244  NA 134* 136 134* 135 137 138  K 3.9 3.9 4.2 4.2 4.5 4.5  CL 102 102 100 101 102 103  CO2 26 28 25 28 28 29   GLUCOSE 168* 113* 110* 108* 108* 123*  BUN 21 23 28* 27* 29* 33*  CREATININE 0.86 1.00 0.96 1.16 1.08 1.22  CALCIUM 8.6* 8.9 8.7* 8.4* 8.7* 8.7*  MG 1.6* 2.3 1.9 1.9  --   --    Liver Function Tests: Recent Labs  Lab 12/12/20 2257 12/14/20 0205 12/15/20 0158 12/16/20 0217  AST 11* 13* 11* 14*  ALT 11 10 9 10   ALKPHOS 78 61 70 71  BILITOT 0.4 0.8 0.9  0.9  PROT 6.1* 5.3* 6.1* 6.1*  ALBUMIN 3.4* 3.0* 3.3* 3.1*   No results for input(s): LIPASE, AMYLASE in the last 168 hours. Recent Labs  Lab 12/13/20 1309  AMMONIA 28   CBC: Recent Labs  Lab 12/12/20 2257 12/14/20 0205 12/15/20 0158 12/16/20 0406 12/17/20 0210 12/18/20 0239  WBC 7.6 7.0 7.7 7.0 6.4 6.1  NEUTROABS 5.4  --   --   --   --   --   HGB 11.3* 10.5* 11.3* 10.8* 11.0* 10.9*  HCT 35.6* 32.1* 36.1* 33.9* 34.8* 33.9*  MCV 93.2 92.0 93.3 92.4 92.1 91.6  PLT 263 236 243 235 237 227   Cardiac Enzymes: Recent Labs  Lab 12/13/20 0920  CKTOTAL 88   BNP (last 3 results) No results for input(s): BNP in the last 8760 hours.  ProBNP (last 3 results) No results for input(s): PROBNP in the last 8760 hours.  CBG: Recent Labs  Lab 12/18/20 1727 12/18/20 2106 12/19/20 0041 12/19/20 0551 12/19/20 0818  GLUCAP 149* 119* 117* 91 78           DVT prophylaxis: Lovenox  Code Status: Full code  Family Communication: No family at bedside     Objective    Physical Examination:   General: Appearance:    Thin male in no acute distress     Lungs:     respirations unlabored  Heart:    Bradycardic. .   MS:   All extremities are intact.    Neurologic:   Willfully ignoring me-- will answer simple questions at times      Status is: Inpatient  Dispo: The patient is from: SNF              Anticipated d/c is to: SNF              Anticipated d/c date is: 12/26/2020         Barrier to discharge-awaiting isolation for 10 days for COVID 19  COVID-19 Labs  Recent Labs    12/18/20 0239  DDIMER 1.13*  FERRITIN 76  CRP 0.5    Lab Results  Component Value Date   SARSCOV2NAA POSITIVE (A) 12/16/2020   SARSCOV2NAA NEGATIVE 12/12/2020   SARSCOV2NAA NEGATIVE 06/10/2020   SARSCOV2NAA NEGATIVE 06/06/2020            Recent Results (from the past 240 hour(s))  Resp Panel by RT-PCR (Flu A&B, Covid) Nasopharyngeal Swab     Status: None   Collection  Time: 12/12/20 10:57 PM   Specimen: Nasopharyngeal Swab; Nasopharyngeal(NP) swabs in vial transport medium  Result Value Ref Range Status   SARS Coronavirus 2 by RT PCR NEGATIVE NEGATIVE Final    Comment: (NOTE) SARS-CoV-2 target nucleic acids are NOT DETECTED.  The SARS-CoV-2 RNA is generally detectable in upper  respiratory specimens during the acute phase of infection. The lowest concentration of SARS-CoV-2 viral copies this assay can detect is 138 copies/mL. A negative result does not preclude SARS-Cov-2 infection and should not be used as the sole basis for treatment or other patient management decisions. A negative result may occur with  improper specimen collection/handling, submission of specimen other than nasopharyngeal swab, presence of viral mutation(s) within the areas targeted by this assay, and inadequate number of viral copies(<138 copies/mL). A negative result must be combined with clinical observations, patient history, and epidemiological information. The expected result is Negative.  Fact Sheet for Patients:  BloggerCourse.com  Fact Sheet for Healthcare Providers:  SeriousBroker.it  This test is no t yet approved or cleared by the [place] FDA and  has been authorized for detection and/or diagnosis of SARS-CoV-2 by FDA under an Emergency Use Authorization (EUA). This EUA will remain  in effect (meaning this test can be used) for the duration of the COVID-19 declaration under Section 564(b)(1) of the Act, 21 U.S.C.section 360bbb-3(b)(1), unless the authorization is terminated  or revoked sooner.       Influenza A by PCR NEGATIVE NEGATIVE Final   Influenza B by PCR NEGATIVE NEGATIVE Final    Comment: (NOTE) The Xpert Xpress SARS-CoV-2/FLU/RSV plus assay is intended as an aid in the diagnosis of influenza from Nasopharyngeal swab specimens and should not be used as a sole basis for treatment. Nasal washings  and aspirates are unacceptable for Xpert Xpress SARS-CoV-2/FLU/RSV testing.  Fact Sheet for Patients: BloggerCourse.com  Fact Sheet for Healthcare Providers: SeriousBroker.it  This test is not yet approved or cleared by the [place] FDA and has been authorized for detection and/or diagnosis of SARS-CoV-2 by FDA under an Emergency Use Authorization (EUA). This EUA will remain in effect (meaning this test can be used) for the duration of the COVID-19 declaration under Section 564(b)(1) of the Act, 21 U.S.C. section 360bbb-3(b)(1), unless the authorization is terminated or revoked.  Performed at [facility], [address], [place], [place] [address]   MRSA Next Gen by PCR, Nasal     Status: None   Collection Time: 12/13/20 10:42 PM   Specimen: Nasal Mucosa; Nasal Swab  Result Value Ref Range Status   MRSA by PCR Next Gen NOT DETECTED NOT DETECTED Final    Comment: (NOTE) The GeneXpert MRSA Assay (FDA approved for NASAL specimens only), is one component of a comprehensive MRSA colonization surveillance program. It is not intended to diagnose MRSA infection nor to guide or monitor treatment for MRSA infections. Test performance is not FDA approved in patients less than 64 years old. Performed at [facility], [address], [place], [place] [address]   SARS CORONAVIRUS 2 (TAT 6-24 HRS) Nasopharyngeal Nasopharyngeal Swab     Status: Abnormal   Collection Time: 12/16/20 12:58 PM   Specimen: Nasopharyngeal Swab  Result Value Ref Range Status   SARS Coronavirus 2 POSITIVE (A) NEGATIVE Final    Comment: (NOTE) SARS-CoV-2 target nucleic acids are DETECTED.  The SARS-CoV-2 RNA is generally detectable in upper and lower respiratory specimens during the acute phase of infection. Positive results are indicative of the presence of SARS-CoV-2 RNA. Clinical correlation with patient history and other diagnostic  information is  necessary to determine patient infection status. Positive results do not rule out bacterial infection or co-infection with other viruses.  The expected result is Negative.  Fact Sheet for Patients: HairSlick.no  Fact Sheet for Healthcare Providers: quierodirigir.com  This test is not yet approved or cleared by the [place] and  has been authorized for detection and/or diagnosis of SARS-CoV-2 by FDA under an Emergency Use Authorization (EUA). This EUA will remain  in effect (meaning this test can be used) for the duration of the COVID-19 declaration under Section 564(b)(1) of the Act, 21 U. S.C. section 360bbb-3(b)(1), unless the authorization is terminated or revoked sooner.   Performed at [facility], [address], [place], [place] [address]     Joseph Art   [facility]    12/19/2020, 11:48 AM  LOS: 6 days

## 2020-12-20 DIAGNOSIS — R569 Unspecified convulsions: Secondary | ICD-10-CM | POA: Diagnosis not present

## 2020-12-20 LAB — GLUCOSE, CAPILLARY
Glucose-Capillary: 119 mg/dL — ABNORMAL HIGH (ref 70–99)
Glucose-Capillary: 127 mg/dL — ABNORMAL HIGH (ref 70–99)
Glucose-Capillary: 153 mg/dL — ABNORMAL HIGH (ref 70–99)
Glucose-Capillary: 184 mg/dL — ABNORMAL HIGH (ref 70–99)
Glucose-Capillary: 210 mg/dL — ABNORMAL HIGH (ref 70–99)
Glucose-Capillary: 79 mg/dL (ref 70–99)

## 2020-12-20 LAB — BASIC METABOLIC PANEL
Anion gap: 7 (ref 5–15)
BUN: 30 mg/dL — ABNORMAL HIGH (ref 8–23)
CO2: 28 mmol/L (ref 22–32)
Calcium: 8.7 mg/dL — ABNORMAL LOW (ref 8.9–10.3)
Chloride: 101 mmol/L (ref 98–111)
Creatinine, Ser: 0.97 mg/dL (ref 0.61–1.24)
GFR, Estimated: >60 mL/min (ref >60)
Glucose, Bld: 186 mg/dL — ABNORMAL HIGH (ref 70–99)
Potassium: 4.4 mmol/L (ref 3.5–5.1)
Sodium: 136 mmol/L (ref 135–145)

## 2020-12-20 NOTE — Progress Notes (Signed)
Pt refusing CPAP. Unit still in room.

## 2020-12-20 NOTE — Progress Notes (Signed)
[facility]  PROGRESS NOTE  Jeffery Haynes RDE:[id number] DOB: 08-18-1931 DOA: 12/12/2020 PCP: Eloisa Northern, MD   Brief HPI:   87 year old male with a history of Alzheimer's dementia, paroxysmal atrial fibrillation, diabetes mellitus type 2, CVA, seizures, hypertension, BPH came to hospital for left upper extremity contraction and somnolence.  CT of the head, CTA head and neck was unremarkable.  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  Neurology was consulted and he was started on Keppra and Vimpat, then changed to Depakote at increased dose.  EEG showed generalized slowing without any obvious seizure activity.  MRI brain showed old CVA but no acute pathology. On 12/16/2020 COVID test came back positive-- will need 10 days of quarantine.      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 -Presented from skilled nursing facility with altered mental status, increased tone left upper extremity -Given history of seizures this was suspected to be a postictal state -Seen by neurology, MRI was negative, EEG noted generalized slowing -He was on Depakote at baseline but at behavioral control doses, this was increased to 500 mg twice daily -Vimpat was discontinued -Urinalysis, TSH, ammonia level within normal limits  Positive COVID PCR -COVID screening on 12/16/2020 was positive for COVID -Patient is asymptomatic; not requiring oxygen -He was given 1 dose of bebtelovimab, to decrease risk of progression  Hypertension -Continue Norvasc  Paroxysmal atrial fibrillation -Patient is no longer on anticoagulation -Was discontinued outpatient by PCP due to advanced age/dementia/fall risk  Diabetes mellitus type 2 -Hemoglobin A1c is 6.9 -Metformin on hold -Continue sliding scale insulin with NovoLog  Alzheimer's dementia with behavior disturbance -Continue  donepezil, Depakote, olanzapine  BPH -Continue tamsulosin       Scheduled medications:    amLODipine  5 mg Oral Daily   Chlorhexidine Gluconate Cloth  6 each Topical Daily   citalopram  15 mg Oral Daily   divalproex  500 mg Oral Q12H   donepezil  5 mg Oral QHS   enoxaparin (LOVENOX) injection  40 mg Subcutaneous Q24H   insulin aspart  0-6 Units Subcutaneous Q4H   OLANZapine  2.5 mg Oral Once per day on Mon Tue Wed Thu Sat   polyethylene glycol  17 g Oral Daily   senna-docusate  2 tablet Oral BID   tamsulosin  0.4 mg Oral QHS     Data Reviewed:   CBG:  Recent Labs  Lab 12/19/20 1652 12/19/20 2021 12/20/20 0004 12/20/20 0627 12/20/20 0933  GLUCAP 156* 136* 119* 127* 79    SpO2: 100 % O2 Flow Rate (L/min): 2 L/min    Vitals:   12/19/20 1700 12/19/20 2020 12/20/20 0000 12/20/20 0800  BP: (!) 143/58 (!) 132/54 (!) 146/61 (!) 150/52  Pulse: (!) 57 (!) 58 (!) 59 (!) 58  Resp: 15 15 11 11   Temp: 98 F (36.7 C) 98.5 F (36.9 C) 97.7 F (36.5 C) 97.9 F (36.6 C)  TempSrc: Oral Oral Axillary Oral  SpO2: 100% 100% 100% 100%     Intake/Output Summary (Last 24 hours) at 12/20/2020 1055 Last data filed at 12/19/2020 2300 Gross per 24 hour  Intake --  Output 475 ml  Net -475 ml    10/06 1901 - 10/08 0700 In: -  Out: 475 [Urine:475]  There were no vitals filed for this visit.  Data Reviewed: Basic Metabolic Panel: Recent Labs  Lab 12/14/20  0205 12/15/20 0158 12/16/20 0217 12/17/20 0210 12/18/20 0239 12/19/20 0244 12/20/20 0236  NA 134* 136 134* 135 137 138 136  K 3.9 3.9 4.2 4.2 4.5 4.5 4.4  CL 102 102 100 101 102 103 101  CO2 26 28 25 28 28 29 28   GLUCOSE 168* 113* 110* 108* 108* 123* 186*  BUN 21 23 28* 27* 29* 33* 30*  CREATININE 0.86 1.00 0.96 1.16 1.08 1.22 0.97  CALCIUM 8.6* 8.9 8.7* 8.4* 8.7* 8.7* 8.7*  MG 1.6* 2.3 1.9 1.9  --   --   --    Liver Function Tests: Recent Labs  Lab 12/14/20 0205 12/15/20 0158 12/16/20 0217  AST 13*  11* 14*  ALT 10 9 10   ALKPHOS 61 70 71  BILITOT 0.8 0.9 0.9  PROT 5.3* 6.1* 6.1*  ALBUMIN 3.0* 3.3* 3.1*   No results for input(s): LIPASE, AMYLASE in the last 168 hours. Recent Labs  Lab 12/13/20 1309  AMMONIA 28   CBC: Recent Labs  Lab 12/14/20 0205 12/15/20 0158 12/16/20 0406 12/17/20 0210 12/18/20 0239  WBC 7.0 7.7 7.0 6.4 6.1  HGB 10.5* 11.3* 10.8* 11.0* 10.9*  HCT 32.1* 36.1* 33.9* 34.8* 33.9*  MCV 92.0 93.3 92.4 92.1 91.6  PLT 236 243 235 237 227   Cardiac Enzymes: No results for input(s): CKTOTAL, CKMB, CKMBINDEX, TROPONINI in the last 168 hours.  BNP (last 3 results) No results for input(s): BNP in the last 8760 hours.  ProBNP (last 3 results) No results for input(s): PROBNP in the last 8760 hours.  CBG: Recent Labs  Lab 12/19/20 1652 12/19/20 2021 12/20/20 0004 12/20/20 0627 12/20/20 0933  GLUCAP 156* 136* 119* 127* 79           DVT prophylaxis: Lovenox  Code Status: Full code  Family Communication: No family at bedside     Objective    Physical Examination:   General: Appearance:    Thin male in no acute distress     Lungs:     Clear to auscultation bilaterally, respirations unlabored  Heart:    Bradycardic.   MS:   All extremities are intact.    Neurologic:   Awake, alert, more interactive today        Status is: Inpatient  Dispo: The patient is from: SNF              Anticipated d/c is to: SNF              Anticipated d/c date is: 12/26/2020         Barrier to discharge-awaiting isolation for 10 days for COVID 19  COVID-19 Labs  Recent Labs    12/18/20 0239  DDIMER 1.13*  FERRITIN 76  CRP 0.5    Lab Results  Component Value Date   SARSCOV2NAA POSITIVE (A) 12/16/2020   SARSCOV2NAA NEGATIVE 12/12/2020   SARSCOV2NAA NEGATIVE 06/10/2020   SARSCOV2NAA NEGATIVE 06/06/2020            Recent Results (from the past 240 hour(s))  Resp Panel by RT-PCR (Flu A&B, Covid) Nasopharyngeal Swab     Status:  None   Collection Time: 12/12/20 10:57 PM   Specimen: Nasopharyngeal Swab; Nasopharyngeal(NP) swabs in vial transport medium  Result Value Ref Range Status   SARS Coronavirus 2 by RT PCR NEGATIVE NEGATIVE Final    Comment: (NOTE) SARS-CoV-2 target nucleic acids are NOT DETECTED.  The SARS-CoV-2 RNA is generally detectable in upper respiratory specimens during the acute phase of infection. The  lowest concentration of SARS-CoV-2 viral copies this assay can detect is 138 copies/mL. A negative result does not preclude SARS-Cov-2 infection and should not be used as the sole basis for treatment or other patient management decisions. A negative result may occur with  improper specimen collection/handling, submission of specimen other than nasopharyngeal swab, presence of viral mutation(s) within the areas targeted by this assay, and inadequate number of viral copies(<138 copies/mL). A negative result must be combined with clinical observations, patient history, and epidemiological information. The expected result is Negative.  Fact Sheet for Patients:  BloggerCourse.com  Fact Sheet for Healthcare Providers:  SeriousBroker.it  This test is no t yet approved or cleared by the [place] FDA and  has been authorized for detection and/or diagnosis of SARS-CoV-2 by FDA under an Emergency Use Authorization (EUA). This EUA will remain  in effect (meaning this test can be used) for the duration of the COVID-19 declaration under Section 564(b)(1) of the Act, 21 U.S.C.section 360bbb-3(b)(1), unless the authorization is terminated  or revoked sooner.       Influenza A by PCR NEGATIVE NEGATIVE Final   Influenza B by PCR NEGATIVE NEGATIVE Final    Comment: (NOTE) The Xpert Xpress SARS-CoV-2/FLU/RSV plus assay is intended as an aid in the diagnosis of influenza from Nasopharyngeal swab specimens and should not be used as a sole basis for  treatment. Nasal washings and aspirates are unacceptable for Xpert Xpress SARS-CoV-2/FLU/RSV testing.  Fact Sheet for Patients: BloggerCourse.com  Fact Sheet for Healthcare Providers: SeriousBroker.it  This test is not yet approved or cleared by the [place] FDA and has been authorized for detection and/or diagnosis of SARS-CoV-2 by FDA under an Emergency Use Authorization (EUA). This EUA will remain in effect (meaning this test can be used) for the duration of the COVID-19 declaration under Section 564(b)(1) of the Act, 21 U.S.C. section 360bbb-3(b)(1), unless the authorization is terminated or revoked.  Performed at [facility], [address] [place], [place] [address]   MRSA Next Gen by PCR, Nasal     Status: None   Collection Time: 12/13/20 10:42 PM   Specimen: Nasal Mucosa; Nasal Swab  Result Value Ref Range Status   MRSA by PCR Next Gen NOT DETECTED NOT DETECTED Final    Comment: (NOTE) The GeneXpert MRSA Assay (FDA approved for NASAL specimens only), is one component of a comprehensive MRSA colonization surveillance program. It is not intended to diagnose MRSA infection nor to guide or monitor treatment for MRSA infections. Test performance is not FDA approved in patients less than 12 years old. Performed at [facility], [address] [place], [place] [address]   SARS CORONAVIRUS 2 (TAT 6-24 HRS) Nasopharyngeal Nasopharyngeal Swab     Status: Abnormal   Collection Time: 12/16/20 12:58 PM   Specimen: Nasopharyngeal Swab  Result Value Ref Range Status   SARS Coronavirus 2 POSITIVE (A) NEGATIVE Final    Comment: (NOTE) SARS-CoV-2 target nucleic acids are DETECTED.  The SARS-CoV-2 RNA is generally detectable in upper and lower respiratory specimens during the acute phase of infection. Positive results are indicative of the presence of SARS-CoV-2 RNA. Clinical correlation with patient history and  other diagnostic information is  necessary to determine patient infection status. Positive results do not rule out bacterial infection or co-infection with other viruses.  The expected result is Negative.  Fact Sheet for Patients: HairSlick.no  Fact Sheet for Healthcare Providers: quierodirigir.com  This test is not yet approved or cleared  by the [place] and  has been authorized for detection and/or diagnosis of SARS-CoV-2 by FDA under an Emergency Use Authorization (EUA). This EUA will remain  in effect (meaning this test can be used) for the duration of the COVID-19 declaration under Section 564(b)(1) of the Act, 21 U. S.C. section 360bbb-3(b)(1), unless the authorization is terminated or revoked sooner.   Performed at [facility], [address], [place], [place] [address]     Joseph Art   [facility]    12/20/2020, 10:55 AM  LOS: 7 days

## 2020-12-20 NOTE — Plan of Care (Signed)
  Problem: Health Behavior/Discharge Planning: Goal: Ability to manage health-related needs will improve Outcome: Progressing   

## 2020-12-21 DIAGNOSIS — R569 Unspecified convulsions: Secondary | ICD-10-CM | POA: Diagnosis not present

## 2020-12-21 LAB — BASIC METABOLIC PANEL
Anion gap: 7 (ref 5–15)
BUN: 23 mg/dL (ref 8–23)
CO2: 29 mmol/L (ref 22–32)
Calcium: 9.2 mg/dL (ref 8.9–10.3)
Chloride: 99 mmol/L (ref 98–111)
Creatinine, Ser: 0.84 mg/dL (ref 0.61–1.24)
GFR, Estimated: >60 mL/min (ref >60)
Glucose, Bld: 140 mg/dL — ABNORMAL HIGH (ref 70–99)
Potassium: 4.6 mmol/L (ref 3.5–5.1)
Sodium: 135 mmol/L (ref 135–145)

## 2020-12-21 LAB — GLUCOSE, CAPILLARY
Glucose-Capillary: 140 mg/dL — ABNORMAL HIGH (ref 70–99)
Glucose-Capillary: 114 mg/dL — ABNORMAL HIGH (ref 70–99)
Glucose-Capillary: 116 mg/dL — ABNORMAL HIGH (ref 70–99)
Glucose-Capillary: 249 mg/dL — ABNORMAL HIGH (ref 70–99)
Glucose-Capillary: 110 mg/dL — ABNORMAL HIGH (ref 70–99)
Glucose-Capillary: 223 mg/dL — ABNORMAL HIGH (ref 70–99)
Glucose-Capillary: 112 mg/dL — ABNORMAL HIGH (ref 70–99)

## 2020-12-21 MED ORDER — HYDROCERIN EX CREA
TOPICAL_CREAM | Freq: Two times a day (BID) | CUTANEOUS | Status: DC
  Administered 2020-12-27 – 2020-12-29 (×4): 1 via TOPICAL
  Filled 2020-12-21 (×3): qty 113

## 2020-12-21 NOTE — Progress Notes (Signed)
[facility]  PROGRESS NOTE  Jeffery Haynes WEX:[id number] DOB: Aug 31, 1931 DOA: 12/12/2020 PCP: Eloisa Northern, MD   Brief HPI:   87 year old male with a history of Alzheimer's dementia, paroxysmal atrial fibrillation, diabetes mellitus type 2, CVA, seizures, hypertension, BPH came to hospital for left upper extremity contraction and somnolence.  CT of the head, CTA head and neck was unremarkable.  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  Neurology was consulted and he was started on Keppra and Vimpat, then changed to Depakote at increased dose.  EEG showed generalized slowing without any obvious seizure activity.  MRI brain showed old CVA but no acute pathology. On 12/16/2020 COVID test came back positive-- will need 10 days of quarantine.      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 -Presented from skilled nursing facility with altered mental status, increased tone left upper extremity -Given history of seizures this was suspected to be a postictal state -Seen by neurology, MRI was negative, EEG noted generalized slowing -He was on Depakote at baseline but at behavioral control doses, this was increased to 500 mg twice daily -Vimpat was discontinued -Urinalysis, TSH, ammonia level within normal limits  Positive COVID PCR -COVID screening on 12/16/2020 was positive for COVID -Patient is asymptomatic; not requiring oxygen -He was given 1 dose of bebtelovimab, to decrease risk of progression  Hypertension -Continue Norvasc  Paroxysmal atrial fibrillation -Patient is no longer on anticoagulation -Was discontinued outpatient by PCP due to advanced age/dementia/fall risk  Diabetes mellitus type 2 -Hemoglobin A1c is 6.9 -Metformin on hold -Continue sliding scale insulin with NovoLog  Alzheimer's dementia with behavior disturbance -Continue  donepezil, Depakote, olanzapine  BPH -Continue tamsulosin       Scheduled medications:    amLODipine  5 mg Oral Daily   Chlorhexidine Gluconate Cloth  6 each Topical Daily   citalopram  15 mg Oral Daily   divalproex  500 mg Oral Q12H   donepezil  5 mg Oral QHS   enoxaparin (LOVENOX) injection  40 mg Subcutaneous Q24H   hydrocerin   Topical BID   insulin aspart  0-6 Units Subcutaneous Q4H   OLANZapine  2.5 mg Oral Once per day on Mon Tue Wed Thu Sat   polyethylene glycol  17 g Oral Daily   senna-docusate  2 tablet Oral BID   tamsulosin  0.4 mg Oral QHS     Data Reviewed:   CBG:  Recent Labs  Lab 12/20/20 2227 12/21/20 0114 12/21/20 0534 12/21/20 0854 12/21/20 1135  GLUCAP 210* 140* 114* 116* 249*    SpO2: 100 % O2 Flow Rate (L/min): 2 L/min    Vitals:   12/20/20 0800 12/20/20 1200 12/20/20 2221 12/21/20 1014  BP: (!) 150/52 (!) 155/62 (!) 157/76 (!) 159/73  Pulse: (!) 58 (!) 54 76 73  Resp: 11 10 14 20   Temp: 97.9 F (36.6 C) 98 F (36.7 C)  98.6 F (37 C)  TempSrc: Oral Oral  Oral  SpO2: 100% 100% 100% 100%     Intake/Output Summary (Last 24 hours) at 12/21/2020 1211 Last data filed at 12/20/2020 2222 Gross per 24 hour  Intake 100 ml  Output 1200 ml  Net -1100 ml    10/07 1901 - 10/09 0700 In: 100 [P.O.:100] Out: 1675 [Urine:1675]  There were no vitals filed for this visit.  Data Reviewed: Basic Metabolic Panel: Recent Labs  Lab 12/15/20 0158 12/16/20 0217 12/17/20 0210 12/18/20 0239 12/19/20 0244 12/20/20 0236 12/21/20 0228  NA 136 134* 135 137 138 136 135  K 3.9 4.2 4.2 4.5 4.5 4.4 4.6  CL 102 100 101 102 103 101 99  CO2 28 25 28 28 29 28 29   GLUCOSE 113* 110* 108* 108* 123* 186* 140*  BUN 23 28* 27* 29* 33* 30* 23  CREATININE 1.00 0.96 1.16 1.08 1.22 0.97 0.84  CALCIUM 8.9 8.7* 8.4* 8.7* 8.7* 8.7* 9.2  MG 2.3 1.9 1.9  --   --   --   --    Liver Function Tests: Recent Labs  Lab 12/15/20 0158 12/16/20 0217  AST 11* 14*   ALT 9 10  ALKPHOS 70 71  BILITOT 0.9 0.9  PROT 6.1* 6.1*  ALBUMIN 3.3* 3.1*   No results for input(s): LIPASE, AMYLASE in the last 168 hours. No results for input(s): AMMONIA in the last 168 hours.  CBC: Recent Labs  Lab 12/15/20 0158 12/16/20 0406 12/17/20 0210 12/18/20 0239  WBC 7.7 7.0 6.4 6.1  HGB 11.3* 10.8* 11.0* 10.9*  HCT 36.1* 33.9* 34.8* 33.9*  MCV 93.3 92.4 92.1 91.6  PLT 243 235 237 227   Cardiac Enzymes: No results for input(s): CKTOTAL, CKMB, CKMBINDEX, TROPONINI in the last 168 hours.  BNP (last 3 results) No results for input(s): BNP in the last 8760 hours.  ProBNP (last 3 results) No results for input(s): PROBNP in the last 8760 hours.  CBG: Recent Labs  Lab 12/20/20 2227 12/21/20 0114 12/21/20 0534 12/21/20 0854 12/21/20 1135  GLUCAP 210* 140* 114* 116* 249*           DVT prophylaxis: Lovenox  Code Status: Full code  Family Communication: No family at bedside     Objective    Physical Examination:   In bed, NAD   Status is: Inpatient  Dispo: The patient is from: SNF              Anticipated d/c is to: SNF              Anticipated d/c date is: 12/26/2020         Barrier to discharge-awaiting isolation for 10 days for COVID 19  COVID-19 Labs  No results for input(s): DDIMER, FERRITIN, LDH, CRP in the last 72 hours.   Lab Results  Component Value Date   SARSCOV2NAA POSITIVE (A) 12/16/2020   SARSCOV2NAA NEGATIVE 12/12/2020   SARSCOV2NAA NEGATIVE 06/10/2020   SARSCOV2NAA NEGATIVE 06/06/2020            Recent Results (from the past 240 hour(s))  Resp Panel by RT-PCR (Flu A&B, Covid) Nasopharyngeal Swab     Status: None   Collection Time: 12/12/20 10:57 PM   Specimen: Nasopharyngeal Swab; Nasopharyngeal(NP) swabs in vial transport medium  Result Value Ref Range Status   SARS Coronavirus 2 by RT PCR NEGATIVE NEGATIVE Final    Comment: (NOTE) SARS-CoV-2 target nucleic acids are NOT DETECTED.  The  SARS-CoV-2 RNA is generally detectable in upper respiratory specimens during the acute phase of infection. The lowest concentration of SARS-CoV-2 viral copies this assay can detect is 138 copies/mL. A negative result does not preclude SARS-Cov-2 infection and should not be used as the sole basis for treatment or other patient management decisions. A negative result may occur with  improper specimen collection/handling, submission of specimen other than nasopharyngeal swab, presence of viral mutation(s) within the areas targeted by this assay, and inadequate number of  viral copies(<138 copies/mL). A negative result must be combined with clinical observations, patient history, and epidemiological information. The expected result is Negative.  Fact Sheet for Patients:  BloggerCourse.com  Fact Sheet for Healthcare Providers:  SeriousBroker.it  This test is no t yet approved or cleared by the [place] FDA and  has been authorized for detection and/or diagnosis of SARS-CoV-2 by FDA under an Emergency Use Authorization (EUA). This EUA will remain  in effect (meaning this test can be used) for the duration of the COVID-19 declaration under Section 564(b)(1) of the Act, 21 U.S.C.section 360bbb-3(b)(1), unless the authorization is terminated  or revoked sooner.       Influenza A by PCR NEGATIVE NEGATIVE Final   Influenza B by PCR NEGATIVE NEGATIVE Final    Comment: (NOTE) The Xpert Xpress SARS-CoV-2/FLU/RSV plus assay is intended as an aid in the diagnosis of influenza from Nasopharyngeal swab specimens and should not be used as a sole basis for treatment. Nasal washings and aspirates are unacceptable for Xpert Xpress SARS-CoV-2/FLU/RSV testing.  Fact Sheet for Patients: BloggerCourse.com  Fact Sheet for Healthcare Providers: SeriousBroker.it  This test is not yet approved or  cleared by the [place] FDA and has been authorized for detection and/or diagnosis of SARS-CoV-2 by FDA under an Emergency Use Authorization (EUA). This EUA will remain in effect (meaning this test can be used) for the duration of the COVID-19 declaration under Section 564(b)(1) of the Act, 21 U.S.C. section 360bbb-3(b)(1), unless the authorization is terminated or revoked.  Performed at [facility], [address] [place], [place] [address]   MRSA Next Gen by PCR, Nasal     Status: None   Collection Time: 12/13/20 10:42 PM   Specimen: Nasal Mucosa; Nasal Swab  Result Value Ref Range Status   MRSA by PCR Next Gen NOT DETECTED NOT DETECTED Final    Comment: (NOTE) The GeneXpert MRSA Assay (FDA approved for NASAL specimens only), is one component of a comprehensive MRSA colonization surveillance program. It is not intended to diagnose MRSA infection nor to guide or monitor treatment for MRSA infections. Test performance is not FDA approved in patients less than 42 years old. Performed at [facility], [address] [place], [place] [address]   SARS CORONAVIRUS 2 (TAT 6-24 HRS) Nasopharyngeal Nasopharyngeal Swab     Status: Abnormal   Collection Time: 12/16/20 12:58 PM   Specimen: Nasopharyngeal Swab  Result Value Ref Range Status   SARS Coronavirus 2 POSITIVE (A) NEGATIVE Final    Comment: (NOTE) SARS-CoV-2 target nucleic acids are DETECTED.  The SARS-CoV-2 RNA is generally detectable in upper and lower respiratory specimens during the acute phase of infection. Positive results are indicative of the presence of SARS-CoV-2 RNA. Clinical correlation with patient history and other diagnostic information is  necessary to determine patient infection status. Positive results do not rule out bacterial infection or co-infection with other viruses.  The expected result is Negative.  Fact Sheet for Patients: HairSlick.no  Fact Sheet  for Healthcare Providers: quierodirigir.com  This test is not yet approved or cleared by the [place] FDA and  has been authorized for detection and/or diagnosis of SARS-CoV-2 by FDA under an Emergency Use Authorization (EUA). This EUA will remain  in effect (meaning this test can be used) for the duration of the COVID-19 declaration under Section 564(b)(1) of the Act, 21 U. S.C. section 360bbb-3(b)(1), unless the authorization is terminated or revoked sooner.   Performed at [facility], [address], [place], [place] [address]     Joseph Art   [facility]    12/21/2020, 12:11 PM  LOS: 8 days

## 2020-12-22 DIAGNOSIS — R569 Unspecified convulsions: Secondary | ICD-10-CM | POA: Diagnosis not present

## 2020-12-22 LAB — GLUCOSE, CAPILLARY
Glucose-Capillary: 104 mg/dL — ABNORMAL HIGH (ref 70–99)
Glucose-Capillary: 126 mg/dL — ABNORMAL HIGH (ref 70–99)
Glucose-Capillary: 235 mg/dL — ABNORMAL HIGH (ref 70–99)
Glucose-Capillary: 101 mg/dL — ABNORMAL HIGH (ref 70–99)
Glucose-Capillary: 136 mg/dL — ABNORMAL HIGH (ref 70–99)

## 2020-12-22 NOTE — Progress Notes (Signed)
[facility]  PROGRESS NOTE  Jeffery Haynes XLK:[id number] DOB: 1931-10-21 DOA: 12/12/2020 PCP: Eloisa Northern, MD   Brief HPI:   87 year old male with a history of Alzheimer's dementia, paroxysmal atrial fibrillation, diabetes mellitus type 2, CVA, seizures, hypertension, BPH came to hospital for left upper extremity contraction and somnolence.  CT of the head, CTA head and neck was unremarkable.  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  Neurology was consulted and he was started on Keppra and Vimpat, then changed to Depakote at increased dose.  EEG showed generalized slowing without any obvious seizure activity.  MRI brain showed old CVA but no acute pathology. On 12/16/2020 COVID test came back positive-- will need 10 days of quarantine.      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 -Presented from skilled nursing facility with altered mental status, increased tone left upper extremity -Given history of seizures this was suspected to be a postictal state -Seen by neurology, MRI was negative, EEG noted generalized slowing -He was on Depakote at baseline but at behavioral control doses, this was increased to 500 mg twice daily -Vimpat was discontinued -Urinalysis, TSH, ammonia level within normal limits  Positive COVID PCR -COVID screening on 12/16/2020 was positive for COVID -Patient is asymptomatic; not requiring oxygen -He was given 1 dose of bebtelovimab, to decrease risk of progression  Hypertension -Continue Norvasc  Paroxysmal atrial fibrillation -Patient is no longer on anticoagulation -Was discontinued outpatient by PCP due to advanced age/dementia/fall risk  Diabetes mellitus type 2 -Hemoglobin A1c is 6.9 -Metformin on hold -Continue sliding scale insulin with NovoLog  Alzheimer's dementia with behavior  disturbance -Continue donepezil, Depakote, olanzapine  BPH -Continue tamsulosin       Scheduled medications:    amLODipine  5 mg Oral Daily   Chlorhexidine Gluconate Cloth  6 each Topical Daily   citalopram  15 mg Oral Daily   divalproex  500 mg Oral Q12H   donepezil  5 mg Oral QHS   enoxaparin (LOVENOX) injection  40 mg Subcutaneous Q24H   hydrocerin   Topical BID   insulin aspart  0-6 Units Subcutaneous Q4H   OLANZapine  2.5 mg Oral Once per day on Mon Tue Wed Thu Sat   polyethylene glycol  17 g Oral Daily   senna-docusate  2 tablet Oral BID   tamsulosin  0.4 mg Oral QHS     Data Reviewed:   CBG:  Recent Labs  Lab 12/21/20 2138 12/21/20 2325 12/22/20 0430 12/22/20 0904 12/22/20 1215  GLUCAP 223* 112* 104* 126* 235*    SpO2: 98 % O2 Flow Rate (L/min): 2 L/min    Vitals:   12/21/20 2100 12/21/20 2300 12/22/20 0431 12/22/20 1000  BP: (!) 131/41 (!) 142/52 (!) 142/79 (!) 150/80  Pulse: 63 73 76 80  Resp: 20 20 18 18   Temp: 98 F (36.7 C) 98 F (36.7 C) 98 F (36.7 C) 98.6 F (37 C)  TempSrc: Oral Axillary  Oral  SpO2: 95% 95% 99% 98%     Intake/Output Summary (Last 24 hours) at 12/22/2020 1422 Last data filed at 12/22/2020 1000 Gross per 24 hour  Intake --  Output 1250 ml  Net -1250 ml    10/08 1901 - 10/10 0700 In: 100 [P.O.:100] Out: 1950 [Urine:1950]  There were no vitals filed for this visit.  Data Reviewed: Basic  Metabolic Panel: Recent Labs  Lab 12/16/20 0217 12/17/20 0210 12/18/20 0239 12/19/20 0244 12/20/20 0236 12/21/20 0228  NA 134* 135 137 138 136 135  K 4.2 4.2 4.5 4.5 4.4 4.6  CL 100 101 102 103 101 99  CO2 25 28 28 29 28 29   GLUCOSE 110* 108* 108* 123* 186* 140*  BUN 28* 27* 29* 33* 30* 23  CREATININE 0.96 1.16 1.08 1.22 0.97 0.84  CALCIUM 8.7* 8.4* 8.7* 8.7* 8.7* 9.2  MG 1.9 1.9  --   --   --   --    Liver Function Tests: Recent Labs  Lab 12/16/20 0217  AST 14*  ALT 10  ALKPHOS 71  BILITOT 0.9  PROT  6.1*  ALBUMIN 3.1*   No results for input(s): LIPASE, AMYLASE in the last 168 hours. No results for input(s): AMMONIA in the last 168 hours.  CBC: Recent Labs  Lab 12/16/20 0406 12/17/20 0210 12/18/20 0239  WBC 7.0 6.4 6.1  HGB 10.8* 11.0* 10.9*  HCT 33.9* 34.8* 33.9*  MCV 92.4 92.1 91.6  PLT 235 237 227   Cardiac Enzymes: No results for input(s): CKTOTAL, CKMB, CKMBINDEX, TROPONINI in the last 168 hours.  BNP (last 3 results) No results for input(s): BNP in the last 8760 hours.  ProBNP (last 3 results) No results for input(s): PROBNP in the last 8760 hours.  CBG: Recent Labs  Lab 12/21/20 2138 12/21/20 2325 12/22/20 0430 12/22/20 0904 12/22/20 1215  GLUCAP 223* 112* 104* 126* 235*           DVT prophylaxis: Lovenox  Code Status: Full code  Family Communication: No family at bedside     Objective    Physical Examination:   In  bed, eyes closed   Status is: Inpatient  Dispo: The patient is from: SNF              Anticipated d/c is to: SNF              Anticipated d/c date is: 12/26/2020         Barrier to discharge-awaiting isolation for 10 days for COVID 19  COVID-19 Labs  No results for input(s): DDIMER, FERRITIN, LDH, CRP in the last 72 hours.   Lab Results  Component Value Date   SARSCOV2NAA POSITIVE (A) 12/16/2020   SARSCOV2NAA NEGATIVE 12/12/2020   SARSCOV2NAA NEGATIVE 06/10/2020   SARSCOV2NAA NEGATIVE 06/06/2020            Recent Results (from the past 240 hour(s))  Resp Panel by RT-PCR (Flu A&B, Covid) Nasopharyngeal Swab     Status: None   Collection Time: 12/12/20 10:57 PM   Specimen: Nasopharyngeal Swab; Nasopharyngeal(NP) swabs in vial transport medium  Result Value Ref Range Status   SARS Coronavirus 2 by RT PCR NEGATIVE NEGATIVE Final    Comment: (NOTE) SARS-CoV-2 target nucleic acids are NOT DETECTED.  The SARS-CoV-2 RNA is generally detectable in upper respiratory specimens during the acute phase of  infection. The lowest concentration of SARS-CoV-2 viral copies this assay can detect is 138 copies/mL. A negative result does not preclude SARS-Cov-2 infection and should not be used as the sole basis for treatment or other patient management decisions. A negative result may occur with  improper specimen collection/handling, submission of specimen other than nasopharyngeal swab, presence of viral mutation(s) within the areas targeted by this assay, and inadequate number of viral copies(<138 copies/mL). A negative result must be combined with clinical observations, patient history, and epidemiological information. The expected  result is Negative.  Fact Sheet for Patients:  BloggerCourse.com  Fact Sheet for Healthcare Providers:  SeriousBroker.it  This test is no t yet approved or cleared by the [place] FDA and  has been authorized for detection and/or diagnosis of SARS-CoV-2 by FDA under an Emergency Use Authorization (EUA). This EUA will remain  in effect (meaning this test can be used) for the duration of the COVID-19 declaration under Section 564(b)(1) of the Act, 21 U.S.C.section 360bbb-3(b)(1), unless the authorization is terminated  or revoked sooner.       Influenza A by PCR NEGATIVE NEGATIVE Final   Influenza B by PCR NEGATIVE NEGATIVE Final    Comment: (NOTE) The Xpert Xpress SARS-CoV-2/FLU/RSV plus assay is intended as an aid in the diagnosis of influenza from Nasopharyngeal swab specimens and should not be used as a sole basis for treatment. Nasal washings and aspirates are unacceptable for Xpert Xpress SARS-CoV-2/FLU/RSV testing.  Fact Sheet for Patients: BloggerCourse.com  Fact Sheet for Healthcare Providers: SeriousBroker.it  This test is not yet approved or cleared by the [place] FDA and has been authorized for detection and/or diagnosis of SARS-CoV-2  by FDA under an Emergency Use Authorization (EUA). This EUA will remain in effect (meaning this test can be used) for the duration of the COVID-19 declaration under Section 564(b)(1) of the Act, 21 U.S.C. section 360bbb-3(b)(1), unless the authorization is terminated or revoked.  Performed at [facility], [address], [place], [place] [address]   MRSA Next Gen by PCR, Nasal     Status: None   Collection Time: 12/13/20 10:42 PM   Specimen: Nasal Mucosa; Nasal Swab  Result Value Ref Range Status   MRSA by PCR Next Gen NOT DETECTED NOT DETECTED Final    Comment: (NOTE) The GeneXpert MRSA Assay (FDA approved for NASAL specimens only), is one component of a comprehensive MRSA colonization surveillance program. It is not intended to diagnose MRSA infection nor to guide or monitor treatment for MRSA infections. Test performance is not FDA approved in patients less than 82 years old. Performed at [facility], [address] [place], [place] [address]   SARS CORONAVIRUS 2 (TAT 6-24 HRS) Nasopharyngeal Nasopharyngeal Swab     Status: Abnormal   Collection Time: 12/16/20 12:58 PM   Specimen: Nasopharyngeal Swab  Result Value Ref Range Status   SARS Coronavirus 2 POSITIVE (A) NEGATIVE Final    Comment: (NOTE) SARS-CoV-2 target nucleic acids are DETECTED.  The SARS-CoV-2 RNA is generally detectable in upper and lower respiratory specimens during the acute phase of infection. Positive results are indicative of the presence of SARS-CoV-2 RNA. Clinical correlation with patient history and other diagnostic information is  necessary to determine patient infection status. Positive results do not rule out bacterial infection or co-infection with other viruses.  The expected result is Negative.  Fact Sheet for Patients: HairSlick.no  Fact Sheet for Healthcare Providers: quierodirigir.com  This test is not yet approved or  cleared by the [place] FDA and  has been authorized for detection and/or diagnosis of SARS-CoV-2 by FDA under an Emergency Use Authorization (EUA). This EUA will remain  in effect (meaning this test can be used) for the duration of the COVID-19 declaration under Section 564(b)(1) of the Act, 21 U. S.C. section 360bbb-3(b)(1), unless the authorization is terminated or revoked sooner.   Performed at [facility], [address] [place], [place] [address]     Joseph Art   [facility]    12/22/2020, 2:22 PM  LOS: 9 days

## 2020-12-23 DIAGNOSIS — R569 Unspecified convulsions: Secondary | ICD-10-CM | POA: Diagnosis not present

## 2020-12-23 LAB — GLUCOSE, CAPILLARY
Glucose-Capillary: 104 mg/dL — ABNORMAL HIGH (ref 70–99)
Glucose-Capillary: 134 mg/dL — ABNORMAL HIGH (ref 70–99)
Glucose-Capillary: 159 mg/dL — ABNORMAL HIGH (ref 70–99)
Glucose-Capillary: 170 mg/dL — ABNORMAL HIGH (ref 70–99)
Glucose-Capillary: 238 mg/dL — ABNORMAL HIGH (ref 70–99)
Glucose-Capillary: 290 mg/dL — ABNORMAL HIGH (ref 70–99)

## 2020-12-23 NOTE — Progress Notes (Signed)
[facility]  PROGRESS NOTE  Jeffery Haynes VWP:[id number] DOB: 1931/11/15 DOA: 12/12/2020 PCP: Eloisa Northern, MD   Brief HPI:   87 year old male with a history of Alzheimer's dementia, paroxysmal atrial fibrillation, diabetes mellitus type 2, CVA, seizures, hypertension, BPH came to hospital for left upper extremity contraction and somnolence.  CT of the head, CTA head and neck was unremarkable.  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  Neurology was consulted and he was started on Keppra and Vimpat, then changed to Depakote at increased dose.  EEG showed generalized slowing without any obvious seizure activity.  MRI brain showed old CVA but no acute pathology. On 12/16/2020 COVID test came back positive-- will need 10 days of quarantine.      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 -Presented from skilled nursing facility with altered mental status, increased tone left upper extremity -Given history of seizures this was suspected to be a postictal state -Seen by neurology, MRI was negative, EEG noted generalized slowing -He was on Depakote at baseline but at behavioral control doses, this was increased to 500 mg twice daily -Vimpat was discontinued -Urinalysis, TSH, ammonia level within normal limits  Positive COVID PCR -COVID screening on 12/16/2020 was positive for COVID -Patient is asymptomatic; not requiring oxygen -He was given 1 dose of bebtelovimab, to decrease risk of progression  Hypertension -Continue Norvasc  Paroxysmal atrial fibrillation -Patient is no longer on anticoagulation -Was discontinued outpatient by PCP due to advanced age/dementia/fall risk  Diabetes mellitus type 2 -Hemoglobin A1c is 6.9 -Metformin on hold -Continue sliding scale insulin with NovoLog  Alzheimer's dementia with behavior  disturbance -Continue donepezil, Depakote, olanzapine  BPH -Continue tamsulosin       Scheduled medications:    amLODipine  5 mg Oral Daily   Chlorhexidine Gluconate Cloth  6 each Topical Daily   citalopram  15 mg Oral Daily   divalproex  500 mg Oral Q12H   donepezil  5 mg Oral QHS   enoxaparin (LOVENOX) injection  40 mg Subcutaneous Q24H   hydrocerin   Topical BID   insulin aspart  0-6 Units Subcutaneous Q4H   OLANZapine  2.5 mg Oral Once per day on Mon Tue Wed Thu Sat   polyethylene glycol  17 g Oral Daily   senna-docusate  2 tablet Oral BID   tamsulosin  0.4 mg Oral QHS     Data Reviewed:   CBG:  Recent Labs  Lab 12/22/20 1215 12/22/20 1720 12/22/20 2047 12/23/20 0017 12/23/20 0833  GLUCAP 235* 101* 136* 134* 104*    SpO2: 98 % O2 Flow Rate (L/min): 2 L/min    Vitals:   12/22/20 1000 12/22/20 2219 12/23/20 0436 12/23/20 1229  BP: (!) 150/80 (!) 108/59 135/63 110/66  Pulse: 80 79 67 88  Resp: 18 18 18 18   Temp: 98.6 F (37 C) 98.7 F (37.1 C) 98.5 F (36.9 C) 98.4 F (36.9 C)  TempSrc: Oral Oral Oral Oral  SpO2: 98% 96% 96% 98%     Intake/Output Summary (Last 24 hours) at 12/23/2020 1347 Last data filed at 12/23/2020 0831 Gross per 24 hour  Intake --  Output 500 ml  Net -500 ml    10/09 1901 - 10/11 0700 In: -  Out: 1250 [Urine:1250]  There were no vitals filed for this visit.  Data Reviewed: Basic Metabolic Panel: Recent  Labs  Lab 12/17/20 0210 12/18/20 0239 12/19/20 0244 12/20/20 0236 12/21/20 0228  NA 135 137 138 136 135  K 4.2 4.5 4.5 4.4 4.6  CL 101 102 103 101 99  CO2 28 28 29 28 29   GLUCOSE 108* 108* 123* 186* 140*  BUN 27* 29* 33* 30* 23  CREATININE 1.16 1.08 1.22 0.97 0.84  CALCIUM 8.4* 8.7* 8.7* 8.7* 9.2  MG 1.9  --   --   --   --    Liver Function Tests: No results for input(s): AST, ALT, ALKPHOS, BILITOT, PROT, ALBUMIN in the last 168 hours.  No results for input(s): LIPASE, AMYLASE in the last 168  hours. No results for input(s): AMMONIA in the last 168 hours.  CBC: Recent Labs  Lab 12/17/20 0210 12/18/20 0239  WBC 6.4 6.1  HGB 11.0* 10.9*  HCT 34.8* 33.9*  MCV 92.1 91.6  PLT 237 227   Cardiac Enzymes: No results for input(s): CKTOTAL, CKMB, CKMBINDEX, TROPONINI in the last 168 hours.  BNP (last 3 results) No results for input(s): BNP in the last 8760 hours.  ProBNP (last 3 results) No results for input(s): PROBNP in the last 8760 hours.  CBG: Recent Labs  Lab 12/22/20 1215 12/22/20 1720 12/22/20 2047 12/23/20 0017 12/23/20 0833  GLUCAP 235* 101* 136* 134* 104*           DVT prophylaxis: Lovenox  Code Status: Full code  Family Communication: No family at bedside     Objective    Physical Examination:   Awake, working with NT eating breakfast   Status is: Inpatient  Dispo: The patient is from: SNF              Anticipated d/c is to: SNF              Anticipated d/c date is: 12/26/2020         Barrier to discharge-awaiting isolation for 10 days for COVID 19  COVID-19 Labs  No results for input(s): DDIMER, FERRITIN, LDH, CRP in the last 72 hours.   Lab Results  Component Value Date   SARSCOV2NAA POSITIVE (A) 12/16/2020   SARSCOV2NAA NEGATIVE 12/12/2020   SARSCOV2NAA NEGATIVE 06/10/2020   SARSCOV2NAA NEGATIVE 06/06/2020            Recent Results (from the past 240 hour(s))  MRSA Next Gen by PCR, Nasal     Status: None   Collection Time: 12/13/20 10:42 PM   Specimen: Nasal Mucosa; Nasal Swab  Result Value Ref Range Status   MRSA by PCR Next Gen NOT DETECTED NOT DETECTED Final    Comment: (NOTE) The GeneXpert MRSA Assay (FDA approved for NASAL specimens only), is one component of a comprehensive MRSA colonization surveillance program. It is not intended to diagnose MRSA infection nor to guide or monitor treatment for MRSA infections. Test performance is not FDA approved in patients less than 74 years old. Performed at  [facility], [address], [place], [place]   SARS CORONAVIRUS 2 (TAT 6-24 HRS) Nasopharyngeal Nasopharyngeal Swab     Status: Abnormal   Collection Time: 12/16/20 12:58 PM   Specimen: Nasopharyngeal Swab  Result Value Ref Range Status   SARS Coronavirus 2 POSITIVE (A) NEGATIVE Final    Comment: (NOTE) SARS-CoV-2 target nucleic acids are DETECTED.  The SARS-CoV-2 RNA is generally detectable in upper and lower respiratory specimens during the acute phase of infection. Positive results are indicative of the presence of SARS-CoV-2 RNA. Clinical correlation  with patient history and other diagnostic information is  necessary to determine patient infection status. Positive results do not rule out bacterial infection or co-infection with other viruses.  The expected result is Negative.  Fact Sheet for Patients: HairSlick.no  Fact Sheet for Healthcare Providers: quierodirigir.com  This test is not yet approved or cleared by the [place] FDA and  has been authorized for detection and/or diagnosis of SARS-CoV-2 by FDA under an Emergency Use Authorization (EUA). This EUA will remain  in effect (meaning this test can be used) for the duration of the COVID-19 declaration under Section 564(b)(1) of the Act, 21 U. S.C. section 360bbb-3(b)(1), unless the authorization is terminated or revoked sooner.   Performed at [facility], [address] [place], [place] [address]     Joseph Art   [facility]    12/23/2020, 1:47 PM  LOS: 10 days

## 2020-12-23 NOTE — TOC Progression Note (Addendum)
Transition of Care [facility]) - Progression Note    Patient Details  Name: Estanislao Harmon MRN: [id number] Date of Birth: [birth year]  [facility] CM/SW Contact  Lorri Frederick, LCSW Phone Number: 12/23/2020, 9:15 AM  Clinical Narrative:  Pt continues to be in 10 day quarantine due to positive covid test.  Plan for return to LTC at [facility] once complete.    1020: Pt has bed offer in hub from [facility].  CSW spoke with wife [name] but she does not want pt to move there.  If an option came up closer to [place] she would be interested.  Stay with [facility].  Discussed that pt quarantine should be up Saturday for transfer back to [facility].      Expected Discharge Plan: Long Term Nursing Home Barriers to Discharge: Continued Medical Work up  Expected Discharge Plan and Services Expected Discharge Plan: Long Term Nursing Home In-house Referral: Clinical Social Work   Post Acute Care Choice: Nursing Home Living arrangements for the past 2 months: Skilled Nursing Facility                                       Social Determinants of Health (SDOH) Interventions    Readmission Risk Interventions No flowsheet data found.

## 2020-12-23 NOTE — Plan of Care (Signed)

## 2020-12-24 DIAGNOSIS — U071 COVID-19: Secondary | ICD-10-CM | POA: Diagnosis not present

## 2020-12-24 DIAGNOSIS — R569 Unspecified convulsions: Secondary | ICD-10-CM | POA: Diagnosis not present

## 2020-12-24 DIAGNOSIS — G309 Alzheimer's disease, unspecified: Secondary | ICD-10-CM | POA: Diagnosis not present

## 2020-12-24 DIAGNOSIS — E119 Type 2 diabetes mellitus without complications: Secondary | ICD-10-CM | POA: Diagnosis not present

## 2020-12-24 LAB — GLUCOSE, CAPILLARY
Glucose-Capillary: 92 mg/dL (ref 70–99)
Glucose-Capillary: 113 mg/dL — ABNORMAL HIGH (ref 70–99)
Glucose-Capillary: 123 mg/dL — ABNORMAL HIGH (ref 70–99)
Glucose-Capillary: 199 mg/dL — ABNORMAL HIGH (ref 70–99)
Glucose-Capillary: 142 mg/dL — ABNORMAL HIGH (ref 70–99)
Glucose-Capillary: 186 mg/dL — ABNORMAL HIGH (ref 70–99)

## 2020-12-24 NOTE — Progress Notes (Signed)
[facility]  PROGRESS NOTE  Jeffery Haynes PIR:[id number] DOB: 07-03-1931 DOA: 12/12/2020 PCP: Eloisa Northern, MD   Brief HPI:   87 year old male with history of dementia, paroxysmal atrial fibrillation, diabetes mellitus type 2, CVA, seizures, hypertension, BPH presented to hospital with left upper extremity contraction and somnolence.  CT of the head, CTA head and neck was unremarkable.  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  Neurology was consulted and patient started on Keppra, Vimpat and was changed to Depakote at increased dose.  EEG showed generalized slowing without any obvious seizure activity.  MRI brain showed old CVA with no acute pathology. On 12/16/2020, COVID test came back positive, patient is currently 10 days quarantine before going to skilled nursing facility.    Subjective   Seen and examined, denies any complaints.   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 twice daily -Vimpat was discontinued -UA clear, TSH, ammonia level within normal limits  Positive COVID 19 PCR -COVID screening on 12/16/2020 was positive for COVID-19 -Patient is asymptomatic, not requiring oxygen -He was given 1 dose of BB to [place] to decrease risk of progression  Hypertension -Continue Norvasc   Paroxysmal atrial fibrillation -Patient is no longer on anticoagulation -Was discontinued outpatient by PCP due to advanced age/dementia/fall risk   Diabetes mellitus type 2 -Hemoglobin A1c is 6.9 -Metformin on hold -Continue sliding scale insulin with  NovoLog -CBG well controlled   Alzheimer's dementia with behavior disturbance -Continue donepezil, Depakote, olanzapine   BPH -Continue tamsulosin       Scheduled medications:    amLODipine  5 mg Oral Daily   citalopram  15 mg Oral Daily   divalproex  500 mg Oral Q12H   donepezil  5 mg Oral QHS   enoxaparin (LOVENOX) injection  40 mg Subcutaneous Q24H   hydrocerin   Topical BID   insulin aspart  0-6 Units Subcutaneous Q4H   OLANZapine  2.5 mg Oral Once per day on Mon Tue Wed Thu Sat   polyethylene glycol  17 g Oral Daily   senna-docusate  2 tablet Oral BID   tamsulosin  0.4 mg Oral QHS     Data Reviewed:   CBG:  Recent Labs  Lab 12/24/20 0359 12/24/20 0841 12/24/20 0843 12/24/20 1225 12/24/20 1639  GLUCAP 92 113* 123* 199* 142*    SpO2: 100 % O2 Flow Rate (L/min): 2 L/min    Vitals:   12/23/20 1500 12/23/20 2025 12/24/20 0400 12/24/20 0800  BP: 116/64 137/66 (!) 152/61 (!) 128/57  Pulse: 93 84 81   Resp: 18 19 18    Temp: 98.9 F (37.2 C) 98.9 F (37.2 C) 98.5 F (36.9 C)   TempSrc: Oral Oral Oral   SpO2: 100% 99% 100%      Intake/Output Summary (Last 24 hours) at 12/24/2020 1643 Last data filed at 12/24/2020 02/23/2021 Gross per 24 hour  Intake --  Output 1000 ml  Net -1000 ml    10/10 1901 - 10/12 0700 In: -  Out: 1500 [Urine:1500]  There were no vitals filed for this visit.  Data Reviewed: Basic Metabolic Panel: Recent  Labs  Lab 12/18/20 0239 12/19/20 0244 12/20/20 0236 12/21/20 0228  NA 137 138 136 135  K 4.5 4.5 4.4 4.6  CL 102 103 101 99  CO2 28 29 28 29   GLUCOSE 108* 123* 186* 140*  BUN 29* 33* 30* 23  CREATININE 1.08 1.22 0.97 0.84  CALCIUM 8.7* 8.7* 8.7* 9.2   Liver Function Tests: No results for input(s): AST, ALT, ALKPHOS, BILITOT, PROT, ALBUMIN in the last 168 hours. No results for input(s): LIPASE, AMYLASE in the last 168 hours. No results for input(s): AMMONIA in the last 168 hours. CBC: Recent Labs  Lab  12/18/20 0239  WBC 6.1  HGB 10.9*  HCT 33.9*  MCV 91.6  PLT 227   Cardiac Enzymes: No results for input(s): CKTOTAL, CKMB, CKMBINDEX, TROPONINI in the last 168 hours. BNP (last 3 results) No results for input(s): BNP in the last 8760 hours.  ProBNP (last 3 results) No results for input(s): PROBNP in the last 8760 hours.  CBG: Recent Labs  Lab 12/24/20 0359 12/24/20 0841 12/24/20 0843 12/24/20 1225 12/24/20 1639  GLUCAP 92 113* 123* 199* 142*       Radiology Reports  No results found.     Antibiotics: Anti-infectives (From admission, onward)    None         DVT prophylaxis: Lovenox  Code Status: Full code  Family Communication: No family at bedside   Consultants:   Procedures:     Objective    Physical Examination:   General-appears in no acute distress Heart-S1-S2, regular, no murmur auscultated Lungs-clear to auscultation bilaterally, no wheezing or crackles auscultated Abdomen-soft, nontender, no organomegaly Extremities-no edema in the lower extremities Neuro-alert, oriented x3, no focal deficit noted  Status is: Inpatient  Dispo: The patient is from: Home              Anticipated d/c is to: Skilled nursing facility              Anticipated d/c date is: 12/26/2020              Patient currently not stable for discharge  Barrier to discharge-awaiting 10 days of: Time to complete  COVID-19 Labs  No results for input(s): DDIMER, FERRITIN, LDH, CRP in the last 72 hours.  Lab Results  Component Value Date   SARSCOV2NAA POSITIVE (A) 12/16/2020   SARSCOV2NAA NEGATIVE 12/12/2020   SARSCOV2NAA NEGATIVE 06/10/2020   SARSCOV2NAA NEGATIVE 06/06/2020            Recent Results (from the past 240 hour(s))  SARS CORONAVIRUS 2 (TAT 6-24 HRS) Nasopharyngeal Nasopharyngeal Swab     Status: Abnormal   Collection Time: 12/16/20 12:58 PM   Specimen: Nasopharyngeal Swab  Result Value Ref Range Status   SARS Coronavirus 2 POSITIVE  (A) NEGATIVE Final    Comment: (NOTE) SARS-CoV-2 target nucleic acids are DETECTED.  The SARS-CoV-2 RNA is generally detectable in upper and lower respiratory specimens during the acute phase of infection. Positive results are indicative of the presence of SARS-CoV-2 RNA. Clinical correlation with patient history and other diagnostic information is  necessary to determine patient infection status. Positive results do not rule out bacterial infection or co-infection with other viruses.  The expected result is Negative.  Fact Sheet for Patients: 02/15/21  Fact Sheet for Healthcare Providers: HairSlick.no  This test is not yet approved or cleared by the [place] FDA and  has been authorized for detection and/or diagnosis of SARS-CoV-2 by FDA under an Emergency Use Authorization (  EUA). This EUA will remain  in effect (meaning this test can be used) for the duration of the COVID-19 declaration under Section 564(b)(1) of the Act, 21 U. S.C. section 360bbb-3(b)(1), unless the authorization is terminated or revoked sooner.   Performed at [facility], [address] [place], [place] [address]     Meredeth Ide   [facility] If 7PM-7AM, please contact night-coverage at [email or web address], Office  [phone]   12/24/2020, 4:43 PM  LOS: 11 days

## 2020-12-24 NOTE — Plan of Care (Signed)

## 2020-12-25 DIAGNOSIS — U071 COVID-19: Secondary | ICD-10-CM | POA: Diagnosis not present

## 2020-12-25 DIAGNOSIS — G309 Alzheimer's disease, unspecified: Secondary | ICD-10-CM | POA: Diagnosis not present

## 2020-12-25 DIAGNOSIS — R569 Unspecified convulsions: Secondary | ICD-10-CM | POA: Diagnosis not present

## 2020-12-25 DIAGNOSIS — E119 Type 2 diabetes mellitus without complications: Secondary | ICD-10-CM | POA: Diagnosis not present

## 2020-12-25 LAB — GLUCOSE, CAPILLARY
Glucose-Capillary: 119 mg/dL — ABNORMAL HIGH (ref 70–99)
Glucose-Capillary: 128 mg/dL — ABNORMAL HIGH (ref 70–99)
Glucose-Capillary: 140 mg/dL — ABNORMAL HIGH (ref 70–99)
Glucose-Capillary: 168 mg/dL — ABNORMAL HIGH (ref 70–99)
Glucose-Capillary: 283 mg/dL — ABNORMAL HIGH (ref 70–99)

## 2020-12-25 MED ORDER — DIVALPROEX SODIUM 125 MG PO CSDR
500.0000 mg | DELAYED_RELEASE_CAPSULE | Freq: Two times a day (BID) | ORAL | Status: DC
  Administered 2020-12-25 – 2020-12-29 (×9): 500 mg via ORAL
  Filled 2020-12-25 (×12): qty 4

## 2020-12-25 NOTE — TOC Progression Note (Signed)
Transition of Care [facility]) - Progression Note    Patient Details  Name: Jeffery Haynes MRN: [id number] Date of Birth: 1931-10-31  [facility] CM/SW Contact  Lorri Frederick, LCSW Phone Number: 12/25/2020, 1:43 PM  Clinical Narrative:   CSW confirmed with Lynden Ang at [facility] that pt will return on Saturday, 10/15.  No covid needed as pt is currently positive.  No auth as pt is LTC.    Expected Discharge Plan: Long Term Nursing Home Barriers to Discharge: Continued Medical Work up  Expected Discharge Plan and Services Expected Discharge Plan: Long Term Nursing Home In-house Referral: Clinical Social Work   Post Acute Care Choice: Nursing Home Living arrangements for the past 2 months: Skilled Nursing Facility                                       Social Determinants of Health (SDOH) Interventions    Readmission Risk Interventions No flowsheet data found.

## 2020-12-25 NOTE — Progress Notes (Signed)
[facility]  PROGRESS NOTE  Jeffery Haynes TLX:[id number] DOB: Apr 06, 1931 DOA: 12/12/2020 PCP: Eloisa Northern, MD   Brief HPI:   87 year old male with history of dementia, paroxysmal atrial fibrillation, diabetes mellitus type 2, CVA, seizures, hypertension, BPH presented to hospital with left upper extremity contraction and somnolence.  CT of the head, CTA head and neck was unremarkable.  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  Neurology was consulted and patient started on Keppra, Vimpat and was changed to Depakote at increased dose.  EEG showed generalized slowing without any obvious seizure activity.  MRI brain showed old CVA with no acute pathology. On 12/16/2020, COVID test came back positive, patient is currently 10 days quarantine before going to skilled nursing facility.    Subjective   Patient seen and examined, no new complaints.  Somnolent but arousable.   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 twice daily -Vimpat was discontinued -UA clear, TSH, ammonia level within normal limits  Positive COVID 19 PCR -COVID screening on 12/16/2020 was positive for COVID-19 -Patient is asymptomatic, not requiring oxygen -He was given 1 dose of BB to [place] to decrease risk of progression  Hypertension -Continue Norvasc   Paroxysmal atrial fibrillation -Patient is no longer on anticoagulation -Was discontinued outpatient by PCP due to advanced age/dementia/fall risk   Diabetes mellitus type 2 -Hemoglobin A1c is 6.9 -Metformin on hold -Continue  sliding scale insulin with NovoLog -CBG well controlled   Alzheimer's dementia with behavior disturbance -Continue donepezil, Depakote, olanzapine   BPH -Continue tamsulosin       Scheduled medications:    amLODipine  5 mg Oral Daily   citalopram  15 mg Oral Daily   divalproex  500 mg Oral Q12H   donepezil  5 mg Oral QHS   enoxaparin (LOVENOX) injection  40 mg Subcutaneous Q24H   hydrocerin   Topical BID   insulin aspart  0-6 Units Subcutaneous Q4H   OLANZapine  2.5 mg Oral Once per day on Mon Tue Wed Thu Sat   polyethylene glycol  17 g Oral Daily   senna-docusate  2 tablet Oral BID   tamsulosin  0.4 mg Oral QHS     Data Reviewed:   CBG:  Recent Labs  Lab 12/24/20 1639 12/24/20 2106 12/25/20 0029 12/25/20 0426 12/25/20 0736  GLUCAP 142* 186* 128* 140* 119*    SpO2: 98 % O2 Flow Rate (L/min): 2 L/min    Vitals:   12/23/20 2025 12/24/20 0400 12/24/20 0800 12/25/20 0448  BP: 137/66 (!) 152/61 (!) 128/57 (!) 141/66  Pulse: 84 81 80 90  Resp: 19 18 18 19   Temp: 98.9 F (37.2 C) 98.5 F (36.9 C) 98.2 F (36.8 C) 98 F (36.7 C)  TempSrc: Oral Oral Oral Oral  SpO2: 99% 100% 100% 98%     Intake/Output Summary (Last 24 hours) at 12/25/2020 1511 Last data filed at 12/25/2020 0448 Gross per 24 hour  Intake 240 ml  Output 800 ml  Net -560 ml    10/11 1901 - 10/13 0700 In: 780 [P.O.:780] Out: 1800 [Urine:1800]  There were no vitals filed  for this visit.  Data Reviewed: Basic Metabolic Panel: Recent Labs  Lab 12/19/20 0244 12/20/20 0236 12/21/20 0228  NA 138 136 135  K 4.5 4.4 4.6  CL 103 101 99  CO2 29 28 29   GLUCOSE 123* 186* 140*  BUN 33* 30* 23  CREATININE 1.22 0.97 0.84  CALCIUM 8.7* 8.7* 9.2   Liver Function Tests: No results for input(s): AST, ALT, ALKPHOS, BILITOT, PROT, ALBUMIN in the last 168 hours. No results for input(s): LIPASE, AMYLASE in the last 168 hours. No results for input(s): AMMONIA in the last 168 hours. CBC: No  results for input(s): WBC, NEUTROABS, HGB, HCT, MCV, PLT in the last 168 hours.  Cardiac Enzymes: No results for input(s): CKTOTAL, CKMB, CKMBINDEX, TROPONINI in the last 168 hours. BNP (last 3 results) No results for input(s): BNP in the last 8760 hours.  ProBNP (last 3 results) No results for input(s): PROBNP in the last 8760 hours.  CBG: Recent Labs  Lab 12/24/20 1639 12/24/20 2106 12/25/20 0029 12/25/20 0426 12/25/20 0736  GLUCAP 142* 186* 128* 140* 119*       Radiology Reports  No results found.     Antibiotics: Anti-infectives (From admission, onward)    None         DVT prophylaxis: Lovenox  Code Status: Full code  Family Communication: No family at bedside   Consultants:   Procedures:     Objective    Physical Examination:  General-appears in no acute distress Heart-S1-S2, regular, no murmur auscultated Lungs-clear to auscultation bilaterally, no wheezing or crackles auscultated Abdomen-soft, nontender, no organomegaly Extremities-no edema Neuro-somnolent but arousable  Status is: Inpatient  Dispo: The patient is from: Home              Anticipated d/c is to: Skilled nursing facility              Anticipated d/c date is: 12/26/2020              Patient currently not stable for discharge  Barrier to discharge-awaiting 10 days of: Time to complete  COVID-19 Labs  No results for input(s): DDIMER, FERRITIN, LDH, CRP in the last 72 hours.  Lab Results  Component Value Date   SARSCOV2NAA POSITIVE (A) 12/16/2020   SARSCOV2NAA NEGATIVE 12/12/2020   SARSCOV2NAA NEGATIVE 06/10/2020   SARSCOV2NAA NEGATIVE 06/06/2020            Recent Results (from the past 240 hour(s))  SARS CORONAVIRUS 2 (TAT 6-24 HRS) Nasopharyngeal Nasopharyngeal Swab     Status: Abnormal   Collection Time: 12/16/20 12:58 PM   Specimen: Nasopharyngeal Swab  Result Value Ref Range Status   SARS Coronavirus 2 POSITIVE (A) NEGATIVE Final    Comment:  (NOTE) SARS-CoV-2 target nucleic acids are DETECTED.  The SARS-CoV-2 RNA is generally detectable in upper and lower respiratory specimens during the acute phase of infection. Positive results are indicative of the presence of SARS-CoV-2 RNA. Clinical correlation with patient history and other diagnostic information is  necessary to determine patient infection status. Positive results do not rule out bacterial infection or co-infection with other viruses.  The expected result is Negative.  Fact Sheet for Patients: 02/15/21  Fact Sheet for Healthcare Providers: HairSlick.no  This test is not yet approved or cleared by the [place] FDA and  has been authorized for detection and/or diagnosis of SARS-CoV-2 by FDA under an Emergency Use Authorization (EUA). This EUA will remain  in effect (meaning this test can be used) for the  duration of the COVID-19 declaration under Section 564(b)(1) of the Act, 21 U. S.C. section 360bbb-3(b)(1), unless the authorization is terminated or revoked sooner.   Performed at [facility], [address], [place], [place] [address]     Meredeth Ide   [facility] If 7PM-7AM, please contact night-coverage at [email or web address], Office  [phone]   12/25/2020, 3:11 PM  LOS: 12 days

## 2020-12-26 DIAGNOSIS — U071 COVID-19: Secondary | ICD-10-CM | POA: Diagnosis not present

## 2020-12-26 DIAGNOSIS — G309 Alzheimer's disease, unspecified: Secondary | ICD-10-CM | POA: Diagnosis not present

## 2020-12-26 DIAGNOSIS — R569 Unspecified convulsions: Secondary | ICD-10-CM | POA: Diagnosis not present

## 2020-12-26 DIAGNOSIS — E119 Type 2 diabetes mellitus without complications: Secondary | ICD-10-CM | POA: Diagnosis not present

## 2020-12-26 LAB — GLUCOSE, CAPILLARY
Glucose-Capillary: 102 mg/dL — ABNORMAL HIGH (ref 70–99)
Glucose-Capillary: 145 mg/dL — ABNORMAL HIGH (ref 70–99)
Glucose-Capillary: 147 mg/dL — ABNORMAL HIGH (ref 70–99)
Glucose-Capillary: 158 mg/dL — ABNORMAL HIGH (ref 70–99)
Glucose-Capillary: 208 mg/dL — ABNORMAL HIGH (ref 70–99)
Glucose-Capillary: 242 mg/dL — ABNORMAL HIGH (ref 70–99)
Glucose-Capillary: 69 mg/dL — ABNORMAL LOW (ref 70–99)
Glucose-Capillary: 76 mg/dL (ref 70–99)

## 2020-12-26 NOTE — Progress Notes (Signed)
Hypoglycemic Event  CBG: 69  Treatment: 4 oz juice/soda  Symptoms: None  Follow-up CBG: Time:0040 CBG Result:76  Possible Reasons for Event: Inadequate meal intake  Comments/MD notified:Rathore, MD notified    D[place] Ar Sadhana Frater

## 2020-12-26 NOTE — Progress Notes (Signed)
[facility]  PROGRESS NOTE  Placido Hangartner OFB:[id number] DOB: 1931/10/04 DOA: 12/12/2020 PCP: Eloisa Northern, MD   Brief HPI:   87 year old male with history of dementia, paroxysmal atrial fibrillation, diabetes mellitus type 2, CVA, seizures, hypertension, BPH presented to hospital with left upper extremity contraction and somnolence.  CT of the head, CTA head and neck was unremarkable.  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  Neurology was consulted and patient started on Keppra, Vimpat and was changed to Depakote at increased dose.  EEG showed generalized slowing without any obvious seizure activity.  MRI brain showed old CVA with no acute pathology. On 12/16/2020, COVID test came back positive, patient is currently 10 days quarantine before going to skilled nursing facility.    Subjective   Patient seen and examined, denies any complaints.   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 twice daily -Vimpat was discontinued -UA clear, TSH, ammonia level within normal limits  Positive COVID 19 PCR -COVID screening on 12/16/2020 was positive for COVID-19 -Patient is asymptomatic, not requiring oxygen -He was given 1 dose of BB to [place] to decrease risk of progression  Hypertension -Continue Norvasc   Paroxysmal atrial fibrillation -Patient is no longer on anticoagulation -Was discontinued outpatient by PCP due to advanced age/dementia/fall risk   Diabetes mellitus type 2 -Hemoglobin A1c is 6.9 -Metformin on hold -Continue sliding scale insulin with  NovoLog -CBG well controlled   Alzheimer's dementia with behavior disturbance -Continue donepezil, Depakote, olanzapine   BPH -Continue tamsulosin       Scheduled medications:    amLODipine  5 mg Oral Daily   citalopram  15 mg Oral Daily   divalproex  500 mg Oral Q12H   donepezil  5 mg Oral QHS   enoxaparin (LOVENOX) injection  40 mg Subcutaneous Q24H   hydrocerin   Topical BID   insulin aspart  0-6 Units Subcutaneous Q4H   OLANZapine  2.5 mg Oral Once per day on Mon Tue Wed Thu Sat   polyethylene glycol  17 g Oral Daily   senna-docusate  2 tablet Oral BID   tamsulosin  0.4 mg Oral QHS     Data Reviewed:   CBG:  Recent Labs  Lab 12/26/20 0009 12/26/20 0040 12/26/20 0419 12/26/20 0823 12/26/20 1146  GLUCAP 69* 76 145* 102* 158*    SpO2: 96 % O2 Flow Rate (L/min): 2 L/min    Vitals:   12/26/20 0300 12/26/20 0827 12/26/20 1100 12/26/20 1153  BP: 131/61 (!) 137/98 132/67 (!) 151/56  Pulse: 63   74  Resp:  17  16  Temp:      TempSrc:  Oral  Oral  SpO2: 95%   96%     Intake/Output Summary (Last 24 hours) at 12/26/2020 1605 Last data filed at 12/26/2020 0023 Gross per 24 hour  Intake 120 ml  Output 1100 ml  Net -980 ml    10/12 1901 - 10/14 0700 In: 480 [P.O.:480] Out: 1900 [Urine:1900]  There were no vitals filed for this visit.  Data Reviewed: Basic Metabolic Panel: Recent Labs  Lab 12/20/20 0236 12/21/20 7824  NA 136 135  K 4.4 4.6  CL 101 99  CO2 28 29  GLUCOSE 186* 140*  BUN 30* 23  CREATININE 0.97 0.84  CALCIUM 8.7* 9.2   Liver Function Tests: No results for input(s): AST, ALT, ALKPHOS, BILITOT, PROT, ALBUMIN in the last 168 hours. No results for input(s): LIPASE, AMYLASE in the last 168 hours. No results for input(s): AMMONIA in the last 168 hours. CBC: No results for input(s): WBC, NEUTROABS, HGB, HCT, MCV, PLT in the last 168 hours.  Cardiac Enzymes: No results for input(s): CKTOTAL, CKMB, CKMBINDEX, TROPONINI in the  last 168 hours. BNP (last 3 results) No results for input(s): BNP in the last 8760 hours.  ProBNP (last 3 results) No results for input(s): PROBNP in the last 8760 hours.  CBG: Recent Labs  Lab 12/26/20 0009 12/26/20 0040 12/26/20 0419 12/26/20 0823 12/26/20 1146  GLUCAP 69* 76 145* 102* 158*       Radiology Reports  No results found.     Antibiotics: Anti-infectives (From admission, onward)    None         DVT prophylaxis: Lovenox  Code Status: Full code  Family Communication: No family at bedside   Consultants:   Procedures:     Objective    Physical Examination:  General-appears in no acute distress Heart-S1-S2, regular, no murmur auscultated Lungs-clear to auscultation bilaterally, no wheezing or crackles auscultated Abdomen-soft, nontender, no organomegaly Extremities-no edema in the lower extremities Neuro-alert, oriented x2, no focal deficit noted  Status is: Inpatient  Dispo: The patient is from: Home              Anticipated d/c is to: Skilled nursing facility              Anticipated d/c date is: 12/27/2020              Patient currently  stable for discharge  Barrier to discharge-awaiting 10 days of: Time to complete  COVID-19 Labs  No results for input(s): DDIMER, FERRITIN, LDH, CRP in the last 72 hours.  Lab Results  Component Value Date   SARSCOV2NAA POSITIVE (A) 12/16/2020   SARSCOV2NAA NEGATIVE 12/12/2020   SARSCOV2NAA NEGATIVE 06/10/2020   SARSCOV2NAA NEGATIVE 06/06/2020            No results found for this or any previous visit (from the past 240 hour(s)).   Meredeth Ide   [facility] If 7PM-7AM, please contact night-coverage at [email or web address], Office  [phone]   12/26/2020, 4:05 PM  LOS: 13 days

## 2020-12-27 DIAGNOSIS — E119 Type 2 diabetes mellitus without complications: Secondary | ICD-10-CM | POA: Diagnosis not present

## 2020-12-27 DIAGNOSIS — U071 COVID-19: Secondary | ICD-10-CM | POA: Diagnosis not present

## 2020-12-27 DIAGNOSIS — R569 Unspecified convulsions: Secondary | ICD-10-CM | POA: Diagnosis not present

## 2020-12-27 DIAGNOSIS — G309 Alzheimer's disease, unspecified: Secondary | ICD-10-CM | POA: Diagnosis not present

## 2020-12-27 LAB — GLUCOSE, CAPILLARY
Glucose-Capillary: 115 mg/dL — ABNORMAL HIGH (ref 70–99)
Glucose-Capillary: 163 mg/dL — ABNORMAL HIGH (ref 70–99)
Glucose-Capillary: 185 mg/dL — ABNORMAL HIGH (ref 70–99)
Glucose-Capillary: 206 mg/dL — ABNORMAL HIGH (ref 70–99)
Glucose-Capillary: 212 mg/dL — ABNORMAL HIGH (ref 70–99)
Glucose-Capillary: 227 mg/dL — ABNORMAL HIGH (ref 70–99)

## 2020-12-27 MED ORDER — INSULIN ASPART 100 UNIT/ML IJ SOLN: INTRAMUSCULAR | 11 refills | Status: DC

## 2020-12-27 MED ORDER — DIVALPROEX SODIUM 125 MG PO CSDR: 500.0000 mg | DELAYED_RELEASE_CAPSULE | Freq: Two times a day (BID) | ORAL | Status: DC

## 2020-12-27 MED ORDER — TRAZODONE HCL 50 MG PO TABS: 50.0000 mg | ORAL_TABLET | Freq: Every evening | ORAL | Status: DC | PRN

## 2020-12-27 MED ORDER — INSULIN ASPART 100 UNIT/ML IJ SOLN: 0.0000 [IU] | Freq: Three times a day (TID) | INTRAMUSCULAR | 11 refills | Status: AC

## 2020-12-27 NOTE — TOC Transition Note (Signed)
Transition of Care [facility]) - CM/SW Discharge Note   Patient Details  Name: [name] MRN: [id number] Date of Birth: 04/23/1931  [facility] CM/SW Contact:  Carley Hammed, LCSWA Phone Number: 12/27/2020, 9:46 AM   Clinical Narrative:    Pt to be transported to [facility] via [place].  Nurse to call report to [phone].   Final next level of care: Skilled Nursing Facility Barriers to Discharge: Barriers Resolved   Patient Goals and CMS Choice        Discharge Placement              Patient chooses bed at: [facility] Patient to be transferred to facility by: [facility] Name of family member notified: [name] Patient and family notified of of transfer: 12/27/20  Discharge Plan and Services In-house Referral: Clinical Social Work   Post Acute Care Choice: Nursing Home                               Social Determinants of Health (SDOH) Interventions     Readmission Risk Interventions No flowsheet data found.

## 2020-12-27 NOTE — Discharge Summary (Addendum)
Physician Discharge Summary  Jeffery Haynes DUK:[id number] DOB: 1932-02-08 DOA: 12/12/2020  PCP: Eloisa Northern, MD  Admit date: 12/12/2020 Discharge date: 12/29/2020  Time spent: 60 minutes  Recommendations for Outpatient Follow-up:  Patient to be discharged to skilled nursing facility   Discharge Diagnoses:  Principal Problem:   Seizure [facility]) Active Problems:   Alzheimer's dementia (HCC)   Essential hypertension   Diabetes mellitus type 2 in nonobese [facility])   Discharge Condition: Stable  Diet recommendation: Heart healthy diet  There were no vitals filed for this visit.  History of present illness:   87 year old male with history of dementia, paroxysmal atrial fibrillation, diabetes mellitus type 2, CVA, seizures, hypertension, BPH presented to hospital with left upper extremity contraction and somnolence.  CT of the head, CTA head and neck was unremarkable.  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  Neurology was consulted and patient started on Keppra, Vimpat and was changed to Depakote at increased dose.  EEG showed generalized slowing without any obvious seizure activity.  MRI brain showed old CVA with no acute pathology. On 12/16/2020, COVID test came back positive, patient is on 10 days quarantine before going to skilled nursing facility.  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 twice daily -Vimpat was discontinued -UA clear, TSH, ammonia level within normal limits   Positive COVID 19 PCR -COVID screening on 12/16/2020 was positive for COVID-19 -Patient  is asymptomatic, not requiring oxygen -He was given 1 dose of Bbebtelovimab to decrease risk of progression -He has completed 10 days of quarantine in the hospital   Hypertension -Continue Norvasc   Paroxysmal atrial fibrillation -Patient is no longer on anticoagulation -Was discontinued outpatient by PCP due to advanced age/dementia/fall risk   Diabetes mellitus type 2 -Hemoglobin A1c is 6.9 -Continue metformin  -Continue sliding scale insulin with NovoLog    Alzheimer's dementia with behavior disturbance -Continue donepezil, Depakote, olanzapine   BPH -Continue tamsulosin  Patient was earlier discharged on 12/27/2020, however patient's wife refused to be transported to the skilled facility as she felt that the room at the skilled facility was unsafe.  At this time wife agrees for transfer.  We will discharge patient today.     Procedures: None  Consultations:   Discharge Exam: Vitals:   12/27/20 1148 12/27/20 1200  BP: (!) 141/77 (!) 167/73  Pulse: 73 88  Resp:    Temp: 98.2 F (36.8 C)   SpO2: 97% 95%    General: Appears in no acute distress Cardiovascular: S1-S2, regular, no murmur auscultated Respiratory: Clear to auscultation bilaterally  Discharge Instructions   Discharge Instructions     Diet - low sodium heart healthy   Complete by: As directed    Increase activity slowly   Complete by: As directed       Allergies as of 12/27/2020       Reactions   Olanzapine    Possible neuroleptic malignant syndrome. Pt has been tolerating at [facility] in 2022 for months.        Medication List     STOP taking these medications    apixaban 5 MG Tabs tablet Commonly  known as: ELIQUIS   haloperidol decanoate 100 MG/ML injection Commonly known as: HALDOL DECANOATE   LORazepam 0.5 MG tablet Commonly known as: ATIVAN   LORazepam 2 MG/ML concentrated solution Commonly known as: ATIVAN   NON FORMULARY   NON FORMULARY   potassium chloride 10 MEQ CR  capsule Commonly known as: MICRO-K   potassium chloride 10 MEQ tablet Commonly known as: KLOR-CON   sodium chloride 0.9 % infusion   traMADol 50 MG tablet Commonly known as: ULTRAM       TAKE these medications    acetaminophen 325 MG tablet Commonly known as: TYLENOL Take 650 mg by mouth 4 (four) times daily.   amLODipine 5 MG tablet Commonly known as: NORVASC Take 5 mg by mouth in the morning.   citalopram 10 MG tablet Commonly known as: CELEXA Take 15 mg by mouth in the morning.   divalproex 125 MG capsule Commonly known as: DEPAKOTE SPRINKLE Take 4 capsules (500 mg total) by mouth every 12 (twelve) hours. What changed:  how much to take when to take this additional instructions   donepezil 5 MG tablet Commonly known as: Aricept Take 1 tablet (5 mg total) by mouth at bedtime.   insulin aspart 100 UNIT/ML injection Commonly known as: novoLOG Inject 0-9 Units into the skin 3 (three) times daily with meals. Sliding scale insulin Less than 70 initiate hypoglycemia protocol 70-120  0 units 120-150 1 unit 151-200 2 units 201-250 3 units 251-300 5 units 301-350 7 units 351-400 9 units  Greater than 400 call MD   lidocaine 5 % Commonly known as: LIDODERM Place 1 patch onto the skin daily. Remove & Discard patch within 12 hours or as directed by MD What changed:  when to take this additional instructions   metFORMIN 500 MG tablet Commonly known as: GLUCOPHAGE Take 500 mg by mouth 2 (two) times daily.   OLANZapine 2.5 MG tablet Commonly known as: ZYPREXA Take 2.5 mg by mouth See admin instructions. At bedtime on Monday,Tuesday,Wednesday,Thursday and saturday   polyethylene glycol powder 17 GM/SCOOP powder Commonly known as: GLYCOLAX/MIRALAX Take 17 g by mouth daily.   sennosides-docusate sodium 8.6-50 MG tablet Commonly known as: SENOKOT-S Take 2 tablets by mouth 2 (two) times daily.   tamsulosin 0.4 MG Caps capsule Commonly known as: FLOMAX Take  0.4 mg by mouth at bedtime.   traZODone 50 MG tablet Commonly known as: DESYREL Take 1 tablet (50 mg total) by mouth at bedtime as needed for sleep.       Allergies  Allergen Reactions   Olanzapine     Possible neuroleptic malignant syndrome. Pt has been tolerating at [facility] in 2022 for months.      The results of significant diagnostics from this hospitalization (including imaging, microbiology, ancillary and laboratory) are listed below for reference.    Significant Diagnostic Studies: MR BRAIN WO CONTRAST  Result Date: 12/13/2020 CLINICAL DATA:  Neuro deficit, acute, stroke suspected. History of stroke, dementia, and diabetes. EXAM: MRI HEAD WITHOUT CONTRAST TECHNIQUE: Multiplanar, multiecho pulse sequences of the brain and surrounding structures were obtained without intravenous contrast. COMPARISON:  Head CT 12/12/2020 FINDINGS: Brain: There is no evidence of an acute infarct, mass, midline shift, or extra-axial fluid collection. There is a large chronic right ACA infarct with associated chronic blood products. T2 hyperintensities elsewhere in the cerebral white matter bilaterally are nonspecific but compatible with moderate chronic small vessel ischemic disease. Mild chronic small vessel changes are also noted in the pons. There is a  chronic microhemorrhage in the mesial right temporal lobe. There is moderate cerebral atrophy. Vascular: Major intracranial vascular flow voids are preserved. Skull and upper cervical spine: Unremarkable bone marrow signal para Sinuses/Orbits: Bilateral cataract extraction. Tiny mucous retention cyst in the right maxillary sinus. Small right mastoid effusion. Other: None. IMPRESSION: 1. No acute intracranial abnormality. 2. Large chronic right ACA infarct. 3. Moderate chronic small vessel ischemic disease and cerebral atrophy. Electronically Signed   By: Sebastian Ache M.D.   On: 12/13/2020 14:42   DG CHEST PORT 1 VIEW  Result Date: 12/17/2020 CLINICAL DATA:   COVID-19 positive, code stroke, dementia, diabetes mellitus EXAM: PORTABLE CHEST 1 VIEW COMPARISON:  Portable exam 1048 hours compared to 06/17/2020 FINDINGS: Upper normal heart size. Mediastinal contours and pulmonary vascularity normal. Lungs clear. No acute infiltrate, pleural effusion, or pneumothorax. Opacities at inferior LEFT hemithorax from old healed fractures of the posterior LEFT fifth sixth seventh eighth and ninth ribs. Osseous demineralization. IMPRESSION: No acute abnormalities. Multiple old LEFT rib fractures. Electronically Signed   By: Ulyses Southward M.D.   On: 12/17/2020 13:31   EEG adult  Result Date: 12/13/2020 Charlsie Quest, MD     12/13/2020 10:11 AM Patient Name: Jeffery Haynes MRN: [id number] Epilepsy Attending: Charlsie Quest Referring Physician/Provider: Dr Erick Blinks Date: 12/13/2020 Duration: 26.32 mins Patient history:  87 y.o. male with PMH significant for with PMH significant for alzheimers, HTN, hx of prior R ACA territory stroke, dementia, nursing home resident, prior hx of seizures in the setting of hyponatremia and R sharps on EEG in 2012 who is brought in as a stroke code for less responsive. EEG to evaluate for seizure. Level of alertness: lethargic AEDs during EEG study: LEV, LCM Technical aspects: This EEG study was done with scalp electrodes positioned according to the 10-20 International system of electrode placement. Electrical activity was acquired at a sampling rate of  and reviewed with a high frequency filter of  and a low frequency filter of . EEG data were recorded continuously and digitally stored. Description: EEG showed continuous generalized 2-3 Hz delta slowing.  Hyperventilation and photic stimulation were not performed.   Of note, EEG was technically difficult due to significant myogenic artifact was noted. ABNORMALITY - Continuous slow, generalized IMPRESSION: This technically difficult study is suggestive of moderate to severe diffuse  encephalopathy, nonspecific etiology. No seizures or epileptiform discharges were seen throughout the recording. Priyanka Annabelle Harman   Overnight EEG with video  Result Date: 12/13/2020 Charlsie Quest, MD     12/14/2020  9:51 AM Patient Name: Jeffery Haynes MRN: [id number] Epilepsy Attending: Charlsie Quest Referring Physician/Provider: Dr Erick Blinks Duration: 12/13/2020 0149 to 12/13/2020 1259  Patient history:  87 y.o. male with PMH significant for with PMH significant for alzheimers, HTN, hx of prior R ACA territory stroke, dementia, nursing home resident, prior hx of seizures in the setting of hyponatremia and R sharps on EEG in 2012 who is brought in as a stroke code for less responsive. EEG to evaluate for seizure.  Level of alertness: lethargic  AEDs during EEG study:  LCM  Technical aspects: This EEG study was done with scalp electrodes positioned according to the 10-20 International system of electrode placement. Electrical activity was acquired at a sampling rate of  and reviewed with a high frequency filter of  and a low frequency filter of . EEG data were recorded continuously and digitally stored.  Description: EEG showed continuous generalized polymorphic mixed frequencies with predominantly 3-5Hz  theta-  delta slowing admixed with 8-10hz  generalized alpha activity. Intermittent generalized eeg attenuation lasting 2-3 seconds was also noted. Hyperventilation and photic stimulation were not performed.   ABNORMALITY - Continuous slow, generalized  IMPRESSION: This study is suggestive of moderate to severe diffuse encephalopathy, nonspecific etiology. No seizures or epileptiform discharges were seen throughout the recording.  Charlsie Quest   CT HEAD CODE STROKE WO CONTRAST  Result Date: 12/12/2020 CLINICAL DATA:  Neuro deficit, acute, stroke suspected; Stroke/TIA, assess extracranial arteries; Stroke/TIA, assess intracranial arteries EXAM: CT HEAD WITHOUT CONTRAST CT  ANGIOGRAPHY OF THE HEAD AND NECK TECHNIQUE: Contiguous axial images were obtained from the base of the skull through the vertex without intravenous contrast. Multidetector CT imaging of the head and neck was performed using the standard protocol during bolus administration of intravenous contrast. Multiplanar CT image reconstructions and MIPs were obtained to evaluate the vascular anatomy. Carotid stenosis measurements (when applicable) are obtained utilizing NASCET criteria, using the distal internal carotid diameter as the denominator. CONTRAST:  75mL OMNIPAQUE IOHEXOL 350 MG/ML SOLN COMPARISON:  None. FINDINGS: CT HEAD FINDINGS Brain: There is no mass, hemorrhage or extra-axial collection. There is generalized atrophy without lobar predilection. There is hypoattenuation of the periventricular white matter, most commonly indicating chronic ischemic microangiopathy. There is an old right ACA territory infarct. Vascular: Atherosclerotic calcification. Skull: The visualized skull base, calvarium and extracranial soft tissues are normal. Sinuses/Orbits: No fluid levels or advanced mucosal thickening of the visualized paranasal sinuses. No mastoid or middle ear effusion. The orbits are normal. ASPECTS (Alberta Stroke Program Early CT Score) - Ganglionic level infarction (caudate, lentiform nuclei, internal capsule, insula, M1-M3 cortex): 7 - Supraganglionic infarction (M4-M6 cortex): 3 Total score (0-10 with 10 being normal): 10 CTA NECK FINDINGS SKELETON: There is no bony spinal canal stenosis. No lytic or blastic lesion. OTHER NECK: Normal pharynx, larynx and major salivary glands. No cervical lymphadenopathy. Unremarkable thyroid gland. UPPER CHEST: No pneumothorax or pleural effusion. No nodules or masses. AORTIC ARCH: There is calcific atherosclerosis of the aortic arch. There is no aneurysm, dissection or hemodynamically significant stenosis of the visualized portion of the aorta. Conventional 3 vessel aortic  branching pattern. The visualized proximal subclavian arteries are widely patent. RIGHT CAROTID SYSTEM: No dissection, occlusion or aneurysm. Mild atherosclerotic calcification at the carotid bifurcation without hemodynamically significant stenosis. LEFT CAROTID SYSTEM: No dissection, occlusion or aneurysm. Mild atherosclerotic calcification at the carotid bifurcation without hemodynamically significant stenosis. VERTEBRAL ARTERIES: Left dominant configuration. Atherosclerotic narrowing of both vertebral artery origins. There is no dissection, occlusion or flow-limiting stenosis to the skull base (V1-V3 segments). CTA HEAD FINDINGS POSTERIOR CIRCULATION: --Vertebral arteries: Severe stenosis of the distal left V4 segment. Right V4 segment is normal. --Inferior cerebellar arteries: Normal. --Basilar artery: Normal. --Superior cerebellar arteries: Normal. --Posterior cerebral arteries (PCA): Normal. ANTERIOR CIRCULATION: --Intracranial internal carotid arteries: Atherosclerotic calcification of the internal carotid arteries at the skull base without hemodynamically significant stenosis. 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 Atherosclerotic irregularity. --Middle cerebral arteries (MCA): Severe stenosis of the right M2 segment. Otherwise multifocal atherosclerotic irregularity. VENOUS SINUSES: As permitted by contrast timing, patent. ANATOMIC VARIANTS: None Review of the MIP images confirms the above findings. IMPRESSION: 1. No acute hemorrhage. ASPECTS is 10. These results were called by telephone at the time of interpretation on 12/12/2020 at 10:33 pm to provider [facility] , who verbally acknowledged these results. 2. No emergent large vessel occlusion. 3. Severe stenosis of the right MCA M2 segment.  4. Severe stenosis of the left vertebral artery V4 segment. 5. 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 Aortic  Atherosclerosis (ICD10-I70.0). Electronically Signed   By: Deatra Robinson M.D.   On: 12/12/2020 22:44   CT ANGIO HEAD CODE STROKE  Result Date: 12/12/2020 CLINICAL DATA:  Neuro deficit, acute, stroke suspected; Stroke/TIA, assess extracranial arteries; Stroke/TIA, assess intracranial arteries EXAM: CT HEAD WITHOUT CONTRAST CT ANGIOGRAPHY OF THE HEAD AND NECK TECHNIQUE: Contiguous axial images were obtained from the base of the skull through the vertex without intravenous contrast. Multidetector CT imaging of the head and neck was performed using the standard protocol during bolus administration of intravenous contrast. Multiplanar CT image reconstructions and MIPs were obtained to evaluate the vascular anatomy. Carotid stenosis measurements (when applicable) are obtained utilizing NASCET criteria, using the distal internal carotid diameter as the denominator. CONTRAST:  71mL OMNIPAQUE IOHEXOL 350 MG/ML SOLN COMPARISON:  None. FINDINGS: CT HEAD FINDINGS Brain: There is no mass, hemorrhage or extra-axial collection. There is generalized atrophy without lobar predilection. There is hypoattenuation of the periventricular white matter, most commonly indicating chronic ischemic microangiopathy. There is an old right ACA territory infarct. Vascular: Atherosclerotic calcification. Skull: The visualized skull base, calvarium and extracranial soft tissues are normal. Sinuses/Orbits: No fluid levels or advanced mucosal thickening of the visualized paranasal sinuses. No mastoid or middle ear effusion. The orbits are normal. ASPECTS (Alberta Stroke Program Early CT Score) - Ganglionic level infarction (caudate, lentiform nuclei, internal capsule, insula, M1-M3 cortex): 7 - Supraganglionic infarction (M4-M6 cortex): 3 Total score (0-10 with 10 being normal): 10 CTA NECK FINDINGS SKELETON: There is no bony spinal canal stenosis. No lytic or blastic lesion. OTHER NECK: Normal pharynx, larynx and major salivary glands. No  cervical lymphadenopathy. Unremarkable thyroid gland. UPPER CHEST: No pneumothorax or pleural effusion. No nodules or masses. AORTIC ARCH: There is calcific atherosclerosis of the aortic arch. There is no aneurysm, dissection or hemodynamically significant stenosis of the visualized portion of the aorta. Conventional 3 vessel aortic branching pattern. The visualized proximal subclavian arteries are widely patent. RIGHT CAROTID SYSTEM: No dissection, occlusion or aneurysm. Mild atherosclerotic calcification at the carotid bifurcation without hemodynamically significant stenosis. LEFT CAROTID SYSTEM: No dissection, occlusion or aneurysm. Mild atherosclerotic calcification at the carotid bifurcation without hemodynamically significant stenosis. VERTEBRAL ARTERIES: Left dominant configuration. Atherosclerotic narrowing of both vertebral artery origins. There is no dissection, occlusion or flow-limiting stenosis to the skull base (V1-V3 segments). CTA HEAD FINDINGS POSTERIOR CIRCULATION: --Vertebral arteries: Severe stenosis of the distal left V4 segment. Right V4 segment is normal. --Inferior cerebellar arteries: Normal. --Basilar artery: Normal. --Superior cerebellar arteries: Normal. --Posterior cerebral arteries (PCA): Normal. ANTERIOR CIRCULATION: --Intracranial internal carotid arteries: Atherosclerotic calcification of the internal carotid arteries at the skull base without hemodynamically significant stenosis. 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 Atherosclerotic irregularity. --Middle cerebral arteries (MCA): Severe stenosis of the right M2 segment. Otherwise multifocal atherosclerotic irregularity. VENOUS SINUSES: As permitted by contrast timing, patent. ANATOMIC VARIANTS: None Review of the MIP images confirms the above findings. IMPRESSION: 1. No acute hemorrhage. ASPECTS is 10. These results were called by telephone at the time of  interpretation on 12/12/2020 at 10:33 pm to provider [facility] , who verbally acknowledged these results. 2. No emergent large vessel occlusion. 3. Severe stenosis of the right MCA M2 segment. 4. Severe stenosis of the left vertebral artery V4 segment. 5. 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 Aortic  Atherosclerosis (ICD10-I70.0). Electronically Signed   By: Deatra Robinson M.D.   On: 12/12/2020 22:44   CT ANGIO NECK CODE STROKE  Result Date: 12/12/2020 CLINICAL DATA:  Neuro deficit, acute, stroke suspected; Stroke/TIA, assess extracranial arteries; Stroke/TIA, assess intracranial arteries EXAM: CT HEAD WITHOUT CONTRAST CT ANGIOGRAPHY OF THE HEAD AND NECK TECHNIQUE: Contiguous axial images were obtained from the base of the skull through the vertex without intravenous contrast. Multidetector CT imaging of the head and neck was performed using the standard protocol during bolus administration of intravenous contrast. Multiplanar CT image reconstructions and MIPs were obtained to evaluate the vascular anatomy. Carotid stenosis measurements (when applicable) are obtained utilizing NASCET criteria, using the distal internal carotid diameter as the denominator. CONTRAST:  5mL OMNIPAQUE IOHEXOL 350 MG/ML SOLN COMPARISON:  None. FINDINGS: CT HEAD FINDINGS Brain: There is no mass, hemorrhage or extra-axial collection. There is generalized atrophy without lobar predilection. There is hypoattenuation of the periventricular white matter, most commonly indicating chronic ischemic microangiopathy. There is an old right ACA territory infarct. Vascular: Atherosclerotic calcification. Skull: The visualized skull base, calvarium and extracranial soft tissues are normal. Sinuses/Orbits: No fluid levels or advanced mucosal thickening of the visualized paranasal sinuses. No mastoid or middle ear effusion. The orbits are normal. ASPECTS (Alberta Stroke Program Early CT Score) - Ganglionic level  infarction (caudate, lentiform nuclei, internal capsule, insula, M1-M3 cortex): 7 - Supraganglionic infarction (M4-M6 cortex): 3 Total score (0-10 with 10 being normal): 10 CTA NECK FINDINGS SKELETON: There is no bony spinal canal stenosis. No lytic or blastic lesion. OTHER NECK: Normal pharynx, larynx and major salivary glands. No cervical lymphadenopathy. Unremarkable thyroid gland. UPPER CHEST: No pneumothorax or pleural effusion. No nodules or masses. AORTIC ARCH: There is calcific atherosclerosis of the aortic arch. There is no aneurysm, dissection or hemodynamically significant stenosis of the visualized portion of the aorta. Conventional 3 vessel aortic branching pattern. The visualized proximal subclavian arteries are widely patent. RIGHT CAROTID SYSTEM: No dissection, occlusion or aneurysm. Mild atherosclerotic calcification at the carotid bifurcation without hemodynamically significant stenosis. LEFT CAROTID SYSTEM: No dissection, occlusion or aneurysm. Mild atherosclerotic calcification at the carotid bifurcation without hemodynamically significant stenosis. VERTEBRAL ARTERIES: Left dominant configuration. Atherosclerotic narrowing of both vertebral artery origins. There is no dissection, occlusion or flow-limiting stenosis to the skull base (V1-V3 segments). CTA HEAD FINDINGS POSTERIOR CIRCULATION: --Vertebral arteries: Severe stenosis of the distal left V4 segment. Right V4 segment is normal. --Inferior cerebellar arteries: Normal. --Basilar artery: Normal. --Superior cerebellar arteries: Normal. --Posterior cerebral arteries (PCA): Normal. ANTERIOR CIRCULATION: --Intracranial internal carotid arteries: Atherosclerotic calcification of the internal carotid arteries at the skull base without hemodynamically significant stenosis. 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 Atherosclerotic irregularity. --Middle cerebral arteries (MCA): Severe  stenosis of the right M2 segment. Otherwise multifocal atherosclerotic irregularity. VENOUS SINUSES: As permitted by contrast timing, patent. ANATOMIC VARIANTS: None Review of the MIP images confirms the above findings. IMPRESSION: 1. No acute hemorrhage. ASPECTS is 10. These results were called by telephone at the time of interpretation on 12/12/2020 at 10:33 pm to provider [facility] , who verbally acknowledged these results. 2. No emergent large vessel occlusion. 3. Severe stenosis of the right MCA M2 segment. 4. Severe stenosis of the left vertebral artery V4 segment. 5. 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 Aortic Atherosclerosis (ICD10-I70.0). Electronically Signed   By: Deatra Robinson M.D.   On: 12/12/2020 22:44    Microbiology: No results found  for this or any previous visit (from the past 240 hour(s)).   Labs: Basic Metabolic Panel: Recent Labs  Lab 12/21/20 0228  NA 135  K 4.6  CL 99  CO2 29  GLUCOSE 140*  BUN 23  CREATININE 0.84  CALCIUM 9.2     CBG: Recent Labs  Lab 12/26/20 2112 12/26/20 2353 12/27/20 0407 12/27/20 0813 12/27/20 1147  GLUCAP 208* 147* 115* 163* 227*       Signed:  Meredeth Ide MD.  [facility] 12/27/2020, 1:28 PM

## 2020-12-27 NOTE — Plan of Care (Addendum)
Pt confused but cooperative with care. Bath given. Adequate PO intake. Urine via cond cath malodorous. No skin concerns. Pending transport for back transfer. 17:56 Pt's wife called from facility where Nursing Supervisor stated SNF does not have a private room for pt ready and wife refused placement in a semi-private room fearful that pt will not get along with current resident.   Problem: Health Behavior/Discharge Planning: Goal: Ability to manage health-related needs will improve Outcome: Progressing   Problem: Clinical Measurements: Goal: Ability to maintain clinical measurements within normal limits will improve Outcome: Progressing Goal: Will remain free from infection Outcome: Progressing Goal: Diagnostic test results will improve Outcome: Progressing Goal: Respiratory complications will improve Outcome: Progressing Goal: Cardiovascular complication will be avoided Outcome: Progressing

## 2020-12-28 ENCOUNTER — Encounter (HOSPITAL_COMMUNITY): Payer: Self-pay | Admitting: Internal Medicine

## 2020-12-28 ENCOUNTER — Other Ambulatory Visit: Payer: Self-pay

## 2020-12-28 DIAGNOSIS — U071 COVID-19: Secondary | ICD-10-CM | POA: Diagnosis not present

## 2020-12-28 DIAGNOSIS — R569 Unspecified convulsions: Secondary | ICD-10-CM | POA: Diagnosis not present

## 2020-12-28 LAB — GLUCOSE, CAPILLARY
Glucose-Capillary: 159 mg/dL — ABNORMAL HIGH (ref 70–99)
Glucose-Capillary: 159 mg/dL — ABNORMAL HIGH (ref 70–99)
Glucose-Capillary: 179 mg/dL — ABNORMAL HIGH (ref 70–99)
Glucose-Capillary: 179 mg/dL — ABNORMAL HIGH (ref 70–99)
Glucose-Capillary: 214 mg/dL — ABNORMAL HIGH (ref 70–99)
Glucose-Capillary: 118 mg/dL — ABNORMAL HIGH (ref 70–99)

## 2020-12-28 NOTE — TOC Progression Note (Signed)
Transition of Care [facility]) - Progression Note    Patient Details  Name: Jeffery Haynes MRN: [id number] Date of Birth: 07/21/1931  Transition of Care [facility]) CM/SW Contact  Carley Hammed, [place] Phone Number: 12/28/2020, 11:48 AM  Clinical Narrative:    CSW noted that pt was still here this morning, per note and nurse pt's wife called at midnight and refused to have pt transported as she stated the pt has a new roommate and she felt he "wasn't right". Wife called floor nurses many times, while CSW was waiting for facility to sort out the issue. CSW contacted wife and let her know that floor nurses were unable to fix this issue and it would have to be dealt with through the facility. CSW asked if spouse new about her concerns when she spoke with CSW yesterday and agreed to DC, she said she found out later. Per facility the pt is often aggressive and has had to be moved multiple times. The family wants a private room, but none are available. They will have to wait for tomorrow for a new bed assignment. CSW notified spouse of this and let her know that pt has been discharged and there is no medical reason to keep him, so the family may get a bill for the extra day stay. Spouse noted she would take it up with Medicare tomorrow. Current plan is for pt to return to [facility] as soon as a new bed becomes available. TOC will continue to follow for DC needs.   Expected Discharge Plan: Long Term Nursing Home Barriers to Discharge: Barriers Resolved  Expected Discharge Plan and Services Expected Discharge Plan: Long Term Nursing Home In-house Referral: Clinical Social Work   Post Acute Care Choice: Nursing Home Living arrangements for the past 2 months: Skilled Nursing Facility Expected Discharge Date: 12/27/20                                     Social Determinants of Health (SDOH) Interventions    Readmission Risk Interventions No flowsheet data found.

## 2020-12-28 NOTE — Progress Notes (Signed)
[place] transport arrived to unit to transport patient to [facility] around [phone]. Report provided. Spouse, Jeffery Haynes contacted about transfer as she and patient's nurse had discussed during visitation that the room and roommate that the patient was assigned to was dangerous and not a safe environment for the patient. Patient's spouse refused transportation at this time to the LTAC until she is able to have the patient's room and roommate changed to ensure safe stay. Wife ensured patient would remain on the unit in his current room. Wife should be called prior to transportation!!

## 2020-12-28 NOTE — Progress Notes (Addendum)
Subjective: Patient was discharged yesterday, however patient's wife refused patient to be transferred as she felt that the patient's room at the skilled nursing facility was unsafe. This morning patient denies any complaints.  Vitals:   12/28/20 0327 12/28/20 0726  BP: (!) 135/58 (!) 120/59  Pulse: (!) 56 64  Resp: 16 17  Temp: 98 F (36.7 C) 98.4 F (36.9 C)  SpO2: 94% 99%      A/P Seizure disorder COVID-19 infection Medically stable Patient awaiting discharge to skilled nursing facility.    Meredeth Ide [facility] [phone]

## 2020-12-29 LAB — GLUCOSE, CAPILLARY
Glucose-Capillary: 169 mg/dL — ABNORMAL HIGH (ref 70–99)
Glucose-Capillary: 160 mg/dL — ABNORMAL HIGH (ref 70–99)
Glucose-Capillary: 200 mg/dL — ABNORMAL HIGH (ref 70–99)

## 2020-12-29 NOTE — Progress Notes (Signed)
Report called to [phone] [facility]. Nurse: Babette Relic.

## 2020-12-29 NOTE — TOC Transition Note (Signed)
Transition of Care [facility]) - CM/SW Discharge Note   Patient Details  Name: Jeffery Haynes MRN: [id number] Date of Birth: [birth year]  Transition of Care [facility]) CM/SW Contact:  Lorri Frederick, LCSW Phone Number: 12/29/2020, 11:10 AM   Clinical Narrative:   Pt discharging to [facility].  RN call [phone] for report.      Final next level of care: Skilled Nursing Facility Barriers to Discharge: Barriers Resolved   Patient Goals and CMS Choice        Discharge Placement              Patient chooses bed at: [facility] Patient to be transferred to facility by: [facility] Name of family member notified: wife Jeffery Haynes Patient and family notified of of transfer: 12/29/20  Discharge Plan and Services In-house Referral: Clinical Social Work   Post Acute Care Choice: Nursing Home                               Social Determinants of Health (SDOH) Interventions     Readmission Risk Interventions No flowsheet data found.

## 2020-12-29 NOTE — TOC Progression Note (Addendum)
Transition of Care [facility]) - Progression Note    Patient Details  Name: Jeffery Haynes MRN: [id number] Date of Birth: May 05, 1931  Transition of Care [facility]) CM/SW Contact  Lorri Frederick, LCSW Phone Number: 12/29/2020, 9:45 AM  Clinical Narrative:   CSW spoke with Olegario Messier at [facility].  They do not have the ability to rearrange patients today.  The available bed that they have is still with the same roommate from Saturday but this pt does not, according to [facility], have difficult behaviors or pose problems.  Olegario Messier noted that [name] is the patient who often displays difficult behaviors.    CSW spoke with Marchia Meiers, pt wife, regarding the above.  [name] reports that she spoke to several nurses on Saturday who all advised her that the potential roommate does have behaviors, is a "trouble maker" and that it would not be a good idea to place [name] in a room with him. CSW and [name] discussed options: [name] is aware and considering that she could appeal a DC today.  She is also aware that she does not have a lot of options due to her inability to care for pt at home and his difficult behaviors that have made it hard to find SNF options as well.  She is going to call [facility] and have another conversation about when pt could be returned to his previous room and roommate and will call CSW back shortly.   1000: TC received from wife [name].  She spoke to the Child psychotherapist at [facility] and they are planning to move him back to the LTC hall as soon as something opens up.  [name] in agreement that pt will return to [facility] today.  MD informed.      Expected Discharge Plan: Long Term Nursing Home Barriers to Discharge: Barriers Resolved  Expected Discharge Plan and Services Expected Discharge Plan: Long Term Nursing Home In-house Referral: Clinical Social Work   Post Acute Care Choice: Nursing Home Living arrangements for the past 2 months: Skilled Nursing Facility Expected Discharge  Date: 12/27/20                                     Social Determinants of Health (SDOH) Interventions    Readmission Risk Interventions No flowsheet data found.

## 2021-02-01 ENCOUNTER — Emergency Department (HOSPITAL_COMMUNITY): Payer: Medicare Other

## 2021-02-01 ENCOUNTER — Emergency Department (HOSPITAL_COMMUNITY)
Admission: EM | Admit: 2021-02-01 | Discharge: 2021-02-01 | Disposition: A | Discharge status: Skilled Nursing Facility | Payer: Medicare Other | Attending: Emergency Medicine | Admitting: Emergency Medicine

## 2021-02-01 ENCOUNTER — Other Ambulatory Visit: Payer: Self-pay

## 2021-02-01 DIAGNOSIS — E119 Type 2 diabetes mellitus without complications: Secondary | ICD-10-CM | POA: Insufficient documentation

## 2021-02-01 DIAGNOSIS — M19011 Primary osteoarthritis, right shoulder: Secondary | ICD-10-CM | POA: Insufficient documentation

## 2021-02-01 DIAGNOSIS — S0083XA Contusion of other part of head, initial encounter: Secondary | ICD-10-CM | POA: Diagnosis not present

## 2021-02-01 DIAGNOSIS — Z79899 Other long term (current) drug therapy: Secondary | ICD-10-CM | POA: Diagnosis not present

## 2021-02-01 DIAGNOSIS — S0990XA Unspecified injury of head, initial encounter: Secondary | ICD-10-CM

## 2021-02-01 DIAGNOSIS — Y92129 Unspecified place in nursing home as the place of occurrence of the external cause: Secondary | ICD-10-CM | POA: Insufficient documentation

## 2021-02-01 DIAGNOSIS — W19XXXA Unspecified fall, initial encounter: Secondary | ICD-10-CM | POA: Diagnosis not present

## 2021-02-01 DIAGNOSIS — Z794 Long term (current) use of insulin: Secondary | ICD-10-CM | POA: Insufficient documentation

## 2021-02-01 DIAGNOSIS — Z7984 Long term (current) use of oral hypoglycemic drugs: Secondary | ICD-10-CM | POA: Diagnosis not present

## 2021-02-01 DIAGNOSIS — G309 Alzheimer's disease, unspecified: Secondary | ICD-10-CM | POA: Diagnosis not present

## 2021-02-01 DIAGNOSIS — F0280 Dementia in other diseases classified elsewhere without behavioral disturbance: Secondary | ICD-10-CM | POA: Insufficient documentation

## 2021-02-01 DIAGNOSIS — I10 Essential (primary) hypertension: Secondary | ICD-10-CM | POA: Insufficient documentation

## 2021-02-01 IMAGING — CT CT CERVICAL SPINE W/O CM
3 of 4 series · 12 of 33 positions shown, 14 images · non-contrast
Comparison: MRI head [DATE], CT head [DATE]

CLINICAL DATA: Poly trauma, history of dementia.  Found down

EXAM:
CT HEAD WITHOUT CONTRAST
CT CERVICAL SPINE WITHOUT CONTRAST
TECHNIQUE: Multidetector CT imaging of the head and cervical spine was
performed following the standard protocol without intravenous
contrast. Multiplanar CT image reconstructions of the cervical spine
were also generated.

[Series 6: orthogonal bone · axial · 0.23mm/px · z∈[-257,-168]mm · 4 of 75 slices shown, 5 images]
[im 13/75  soft-tissue]
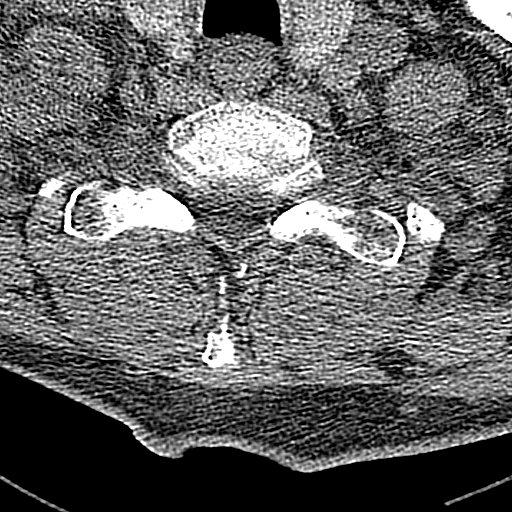
[im 13/75  bone]
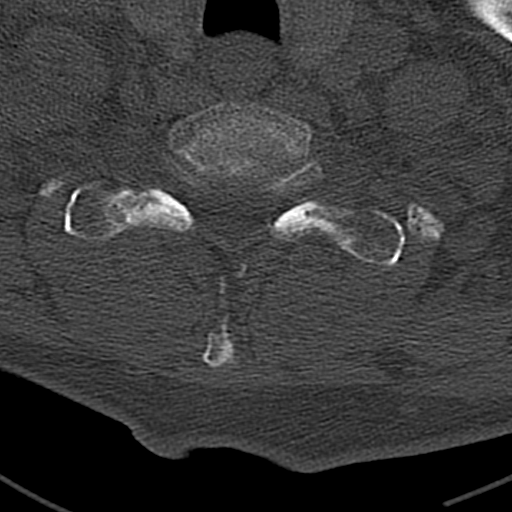
[im 25/75  bone]
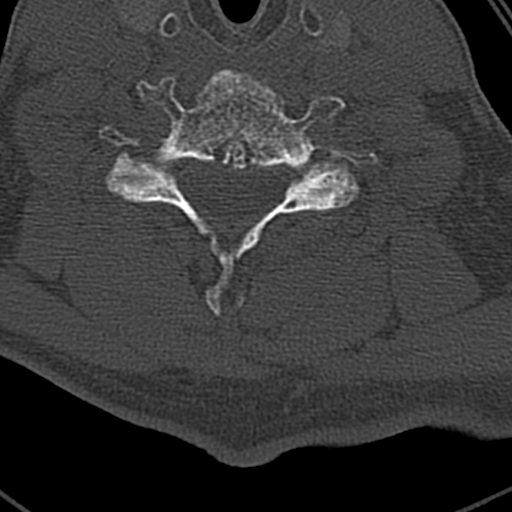
[im 50/75  bone]
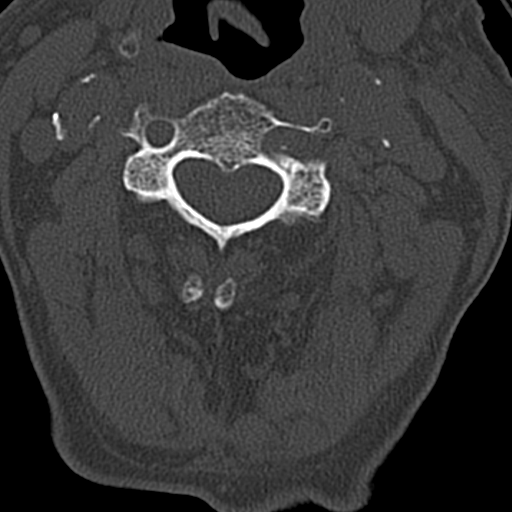
[im 62/75  bone]
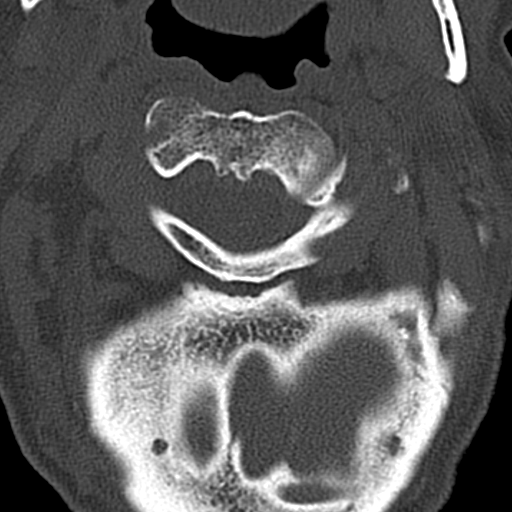

[Series 7: coronal bone · coronal · 0.20mm/px · 3 of 61 slices shown]
[im 14/61  bone]
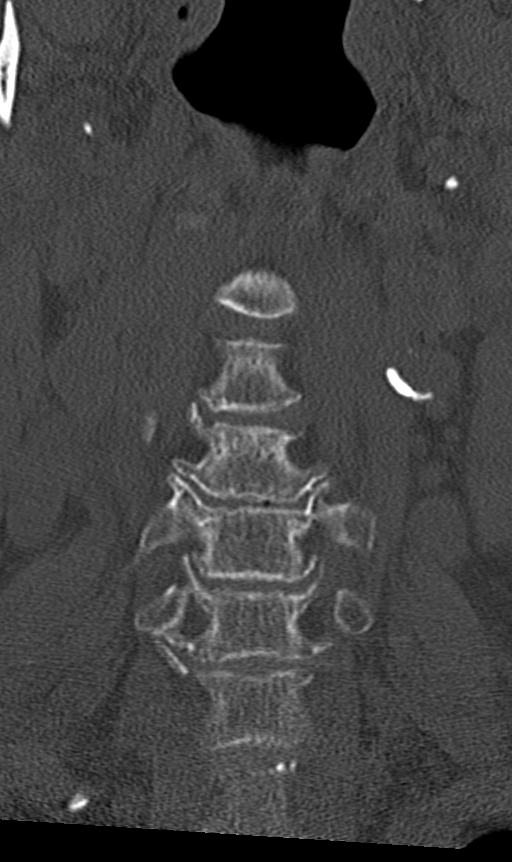
[im 25/61  bone]
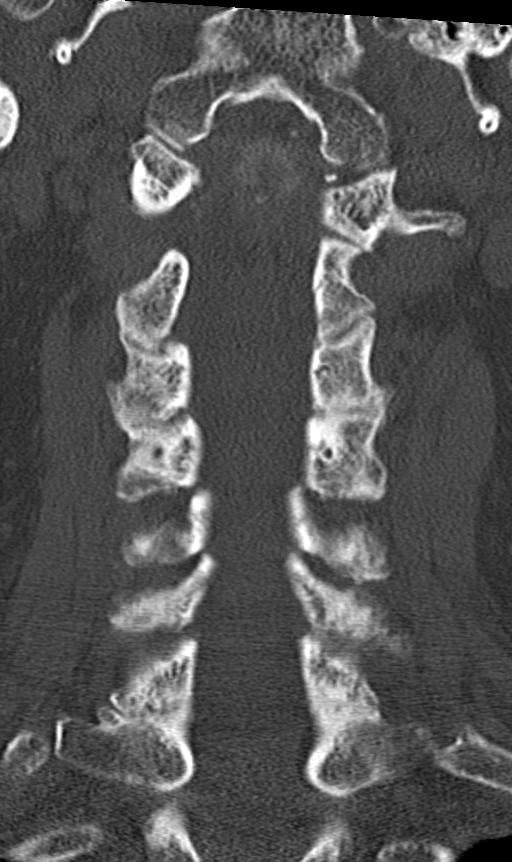
[im 36/61  bone]
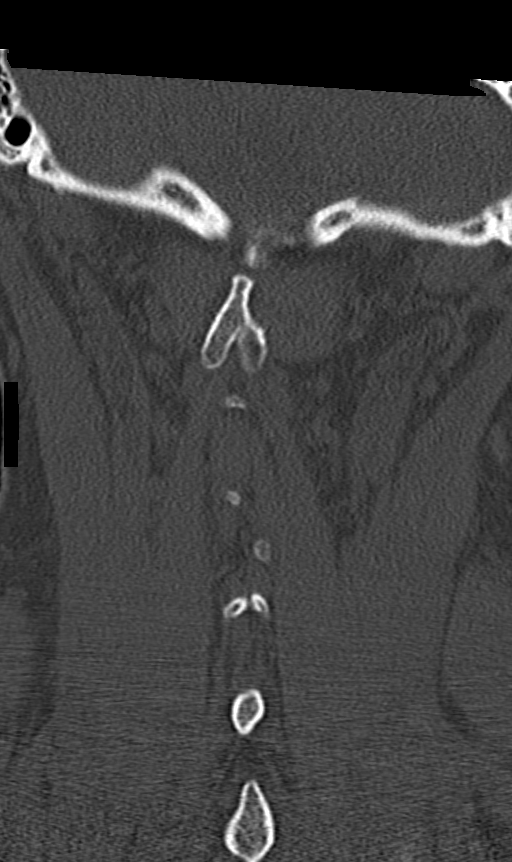

[Series 8: sagittal bone · sagittal · 0.24mm/px · 5 of 51 slices shown, 6 images]
[im 17/51  bone]
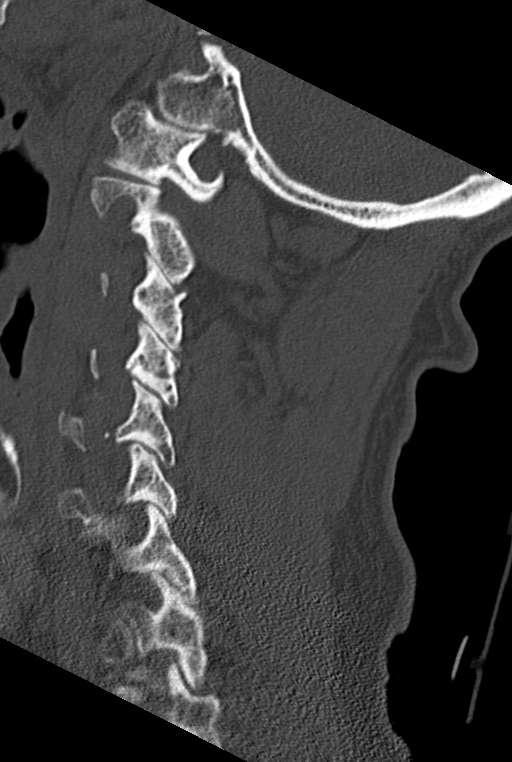
[im 21/51  bone]
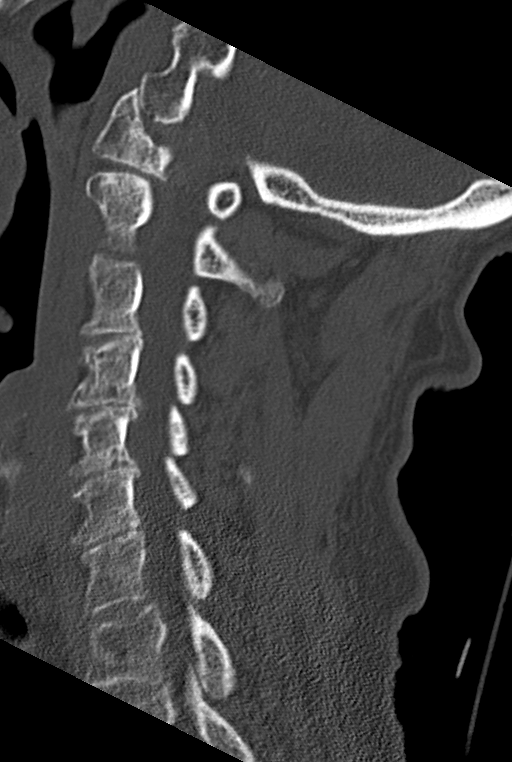
[im 26/51  soft-tissue]
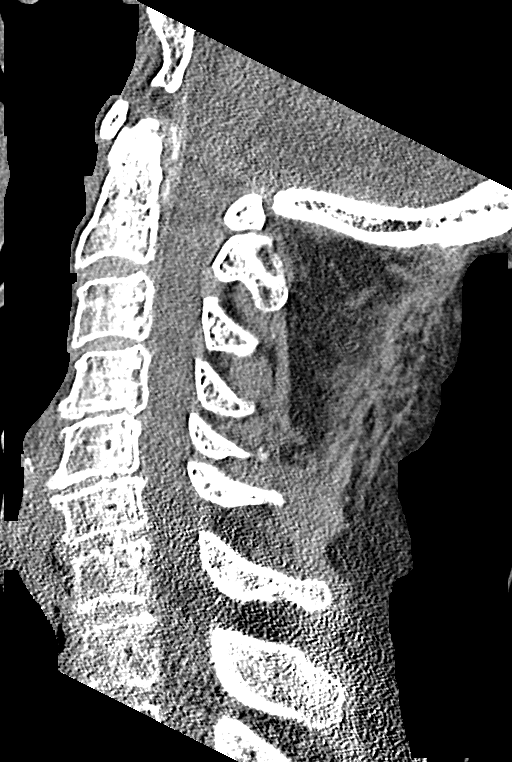
[im 26/51  bone]
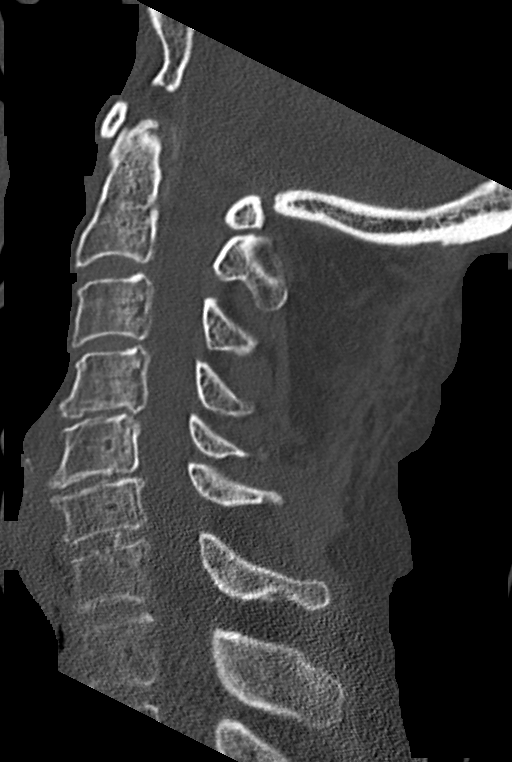
[im 30/51  bone]
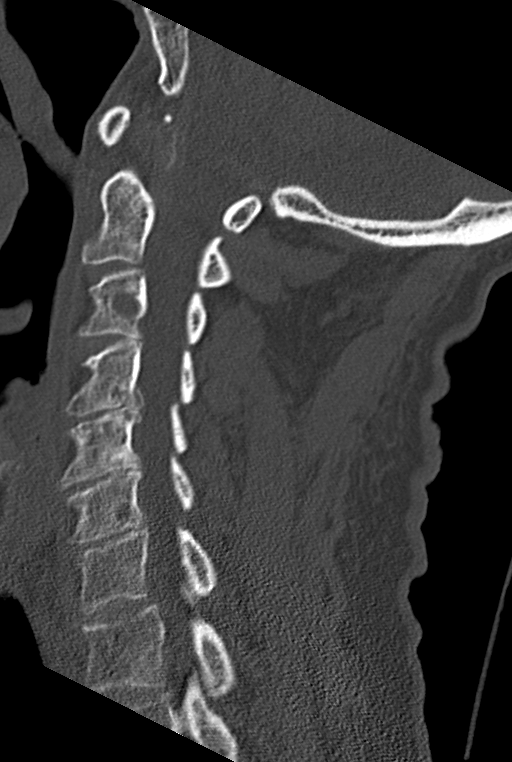
[im 34/51  bone]
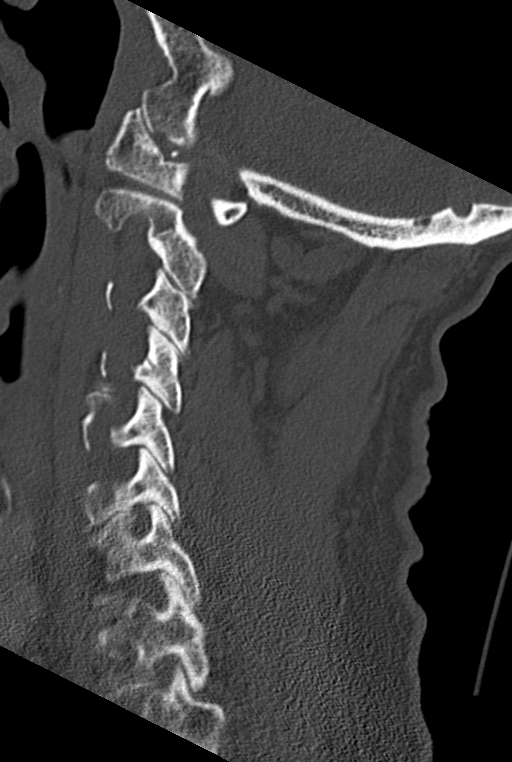

[12 of 33 positions shown; findings below may reference images not displayed]

FINDINGS: CT HEAD FINDINGS

BRAIN:
BRAIN
Cerebral ventricle sizes are concordant with the degree of cerebral
volume loss. Patchy and confluent areas of decreased attenuation are
noted throughout the deep and periventricular white matter of the
cerebral hemispheres bilaterally, compatible with chronic
microvascular ischemic disease. Chronic right ACA infarction.

No evidence of large-territorial acute infarction. No parenchymal
hemorrhage. No mass lesion. No extra-axial collection.

No mass effect or midline shift. No hydrocephalus. Basilar cisterns
are patent.

Vascular: No hyperdense vessel. Atherosclerotic calcifications are
present within the cavernous internal carotid arteries.

Skull: No acute fracture or focal lesion.

Sinuses/Orbits: Right maxillary mucosal polyp. Paranasal sinuses and
mastoid air cells are clear. The orbits are unremarkable.

Other: Right periorbital subcutaneus soft tissue edema. No large
hematoma formation. Right frontal scalp subcutaneus soft tissue
edema with trace scalp hematoma. There is a 1 cm right thyroid gland
hypodensity. Not clinically significant; no follow-up imaging
recommended (ref: [HOSPITAL]. [DATE]): 143-50).

CT CERVICAL SPINE FINDINGS

Alignment: Normal.

Skull base and vertebrae: Multilevel degenerative changes of the
spine. No acute fracture. No aggressive appearing focal osseous
lesion or focal pathologic process.

Soft tissues and spinal canal: No prevertebral fluid or swelling. No
visible canal hematoma.

Upper chest: Unremarkable.

Other: None.
IMPRESSION: 1. No acute intracranial abnormality.
2. No acute displaced fracture or traumatic listhesis of the
cervical spine.

## 2021-02-01 IMAGING — CR DG SHOULDER 2+V*R*
3 series · 3 of 3 positions shown · non-contrast
Comparison: None.

CLINICAL DATA: Dementia, fell

EXAM:
RIGHT SHOULDER - 2+ VIEW

[x shoulder ap right (1 of 3)]
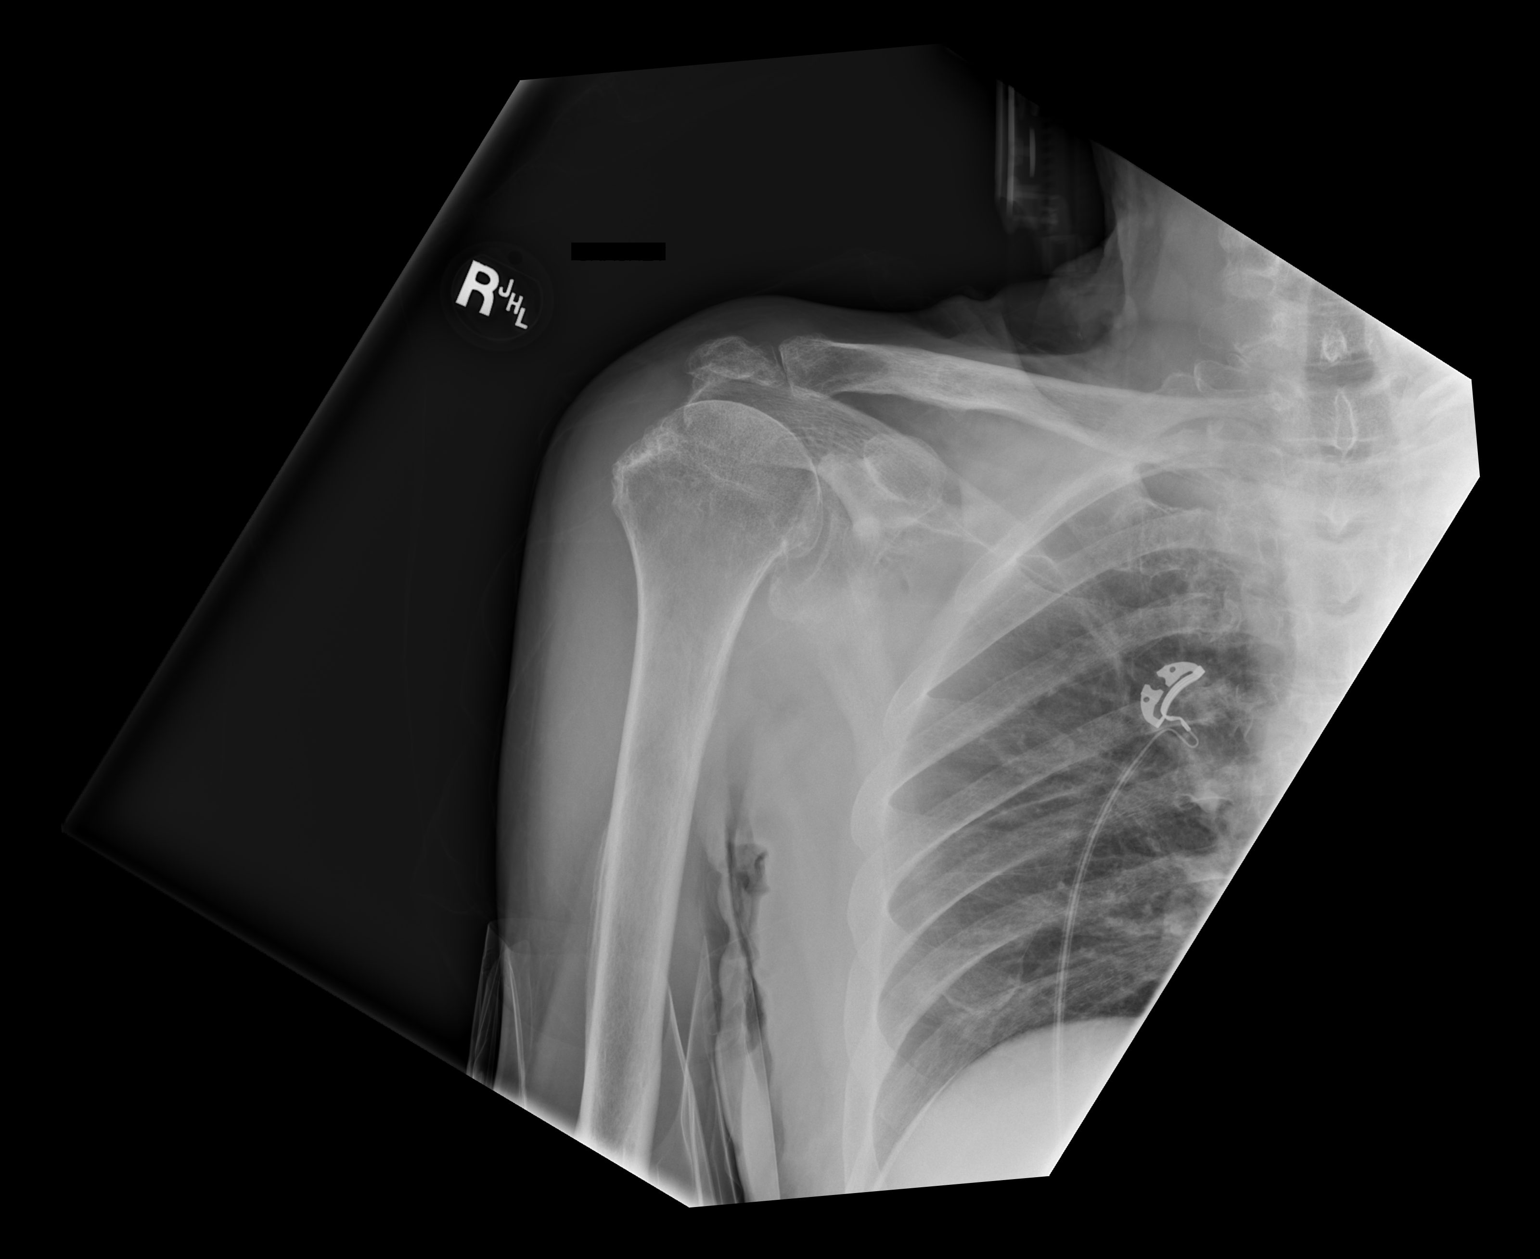

[x shoulder ap right (2 of 3)]
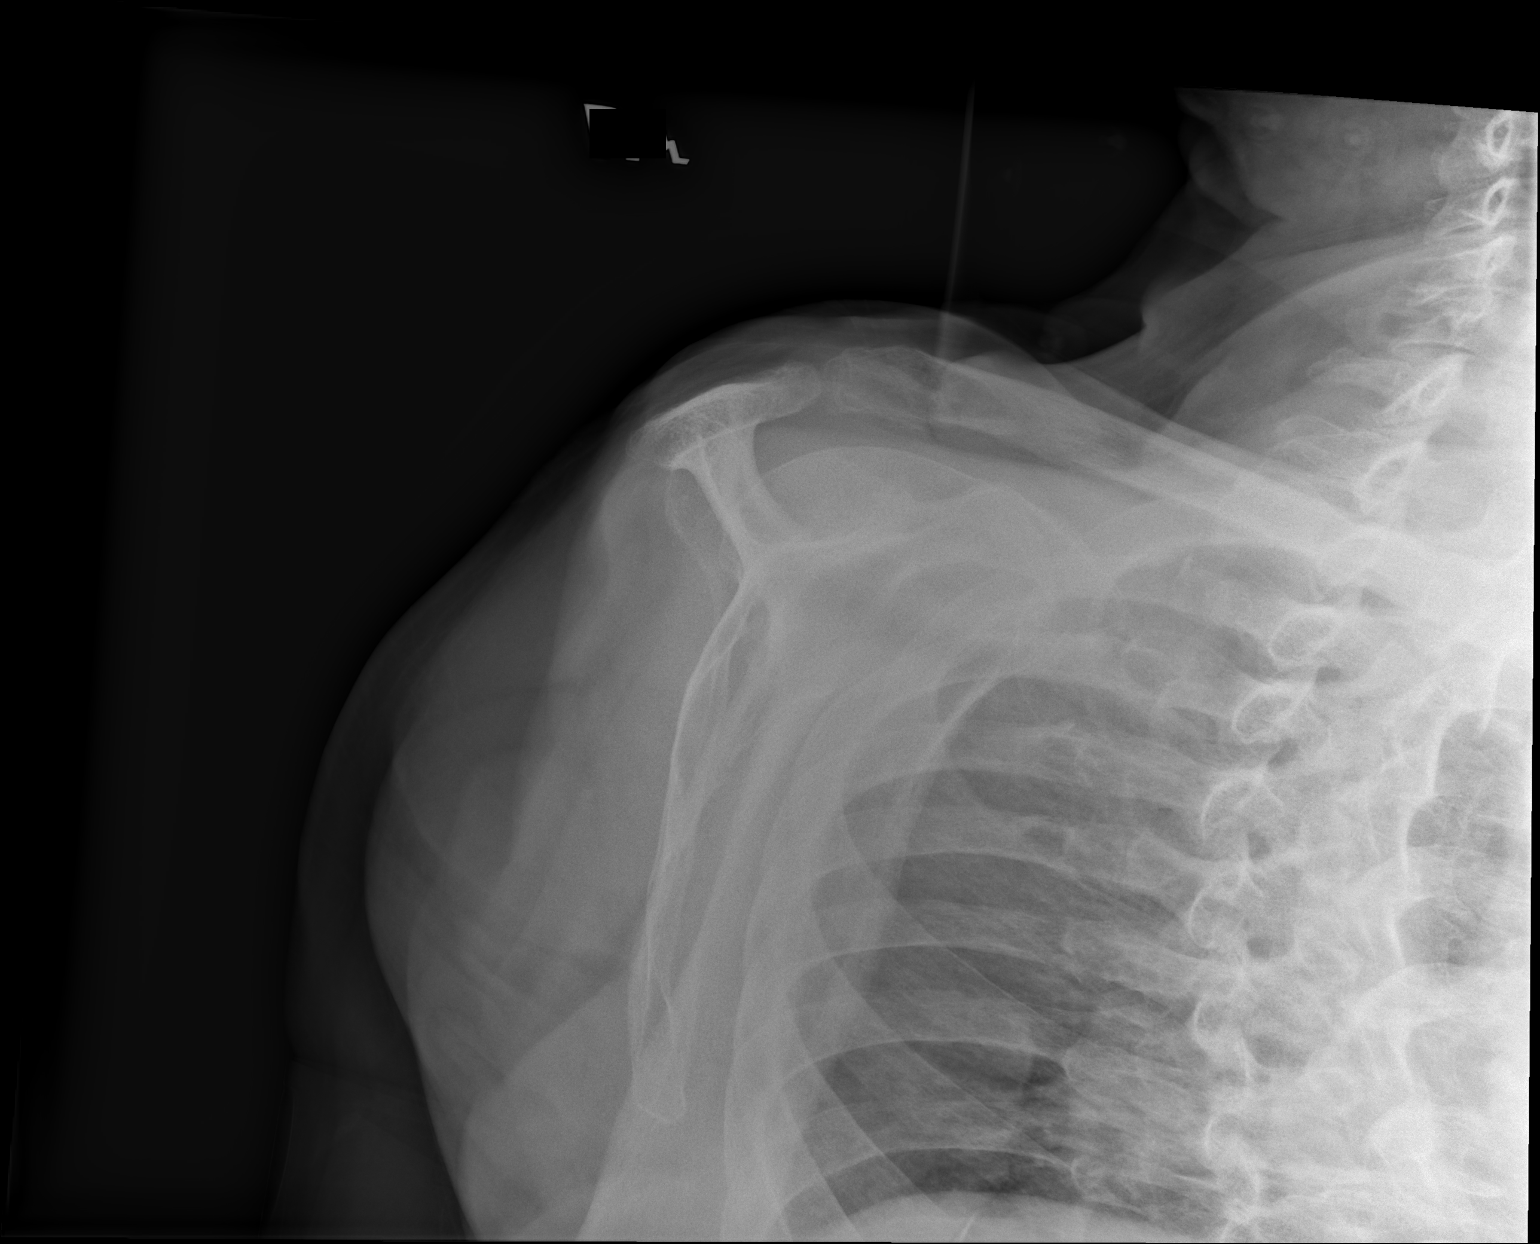

[x shoulder ap right (3 of 3)]
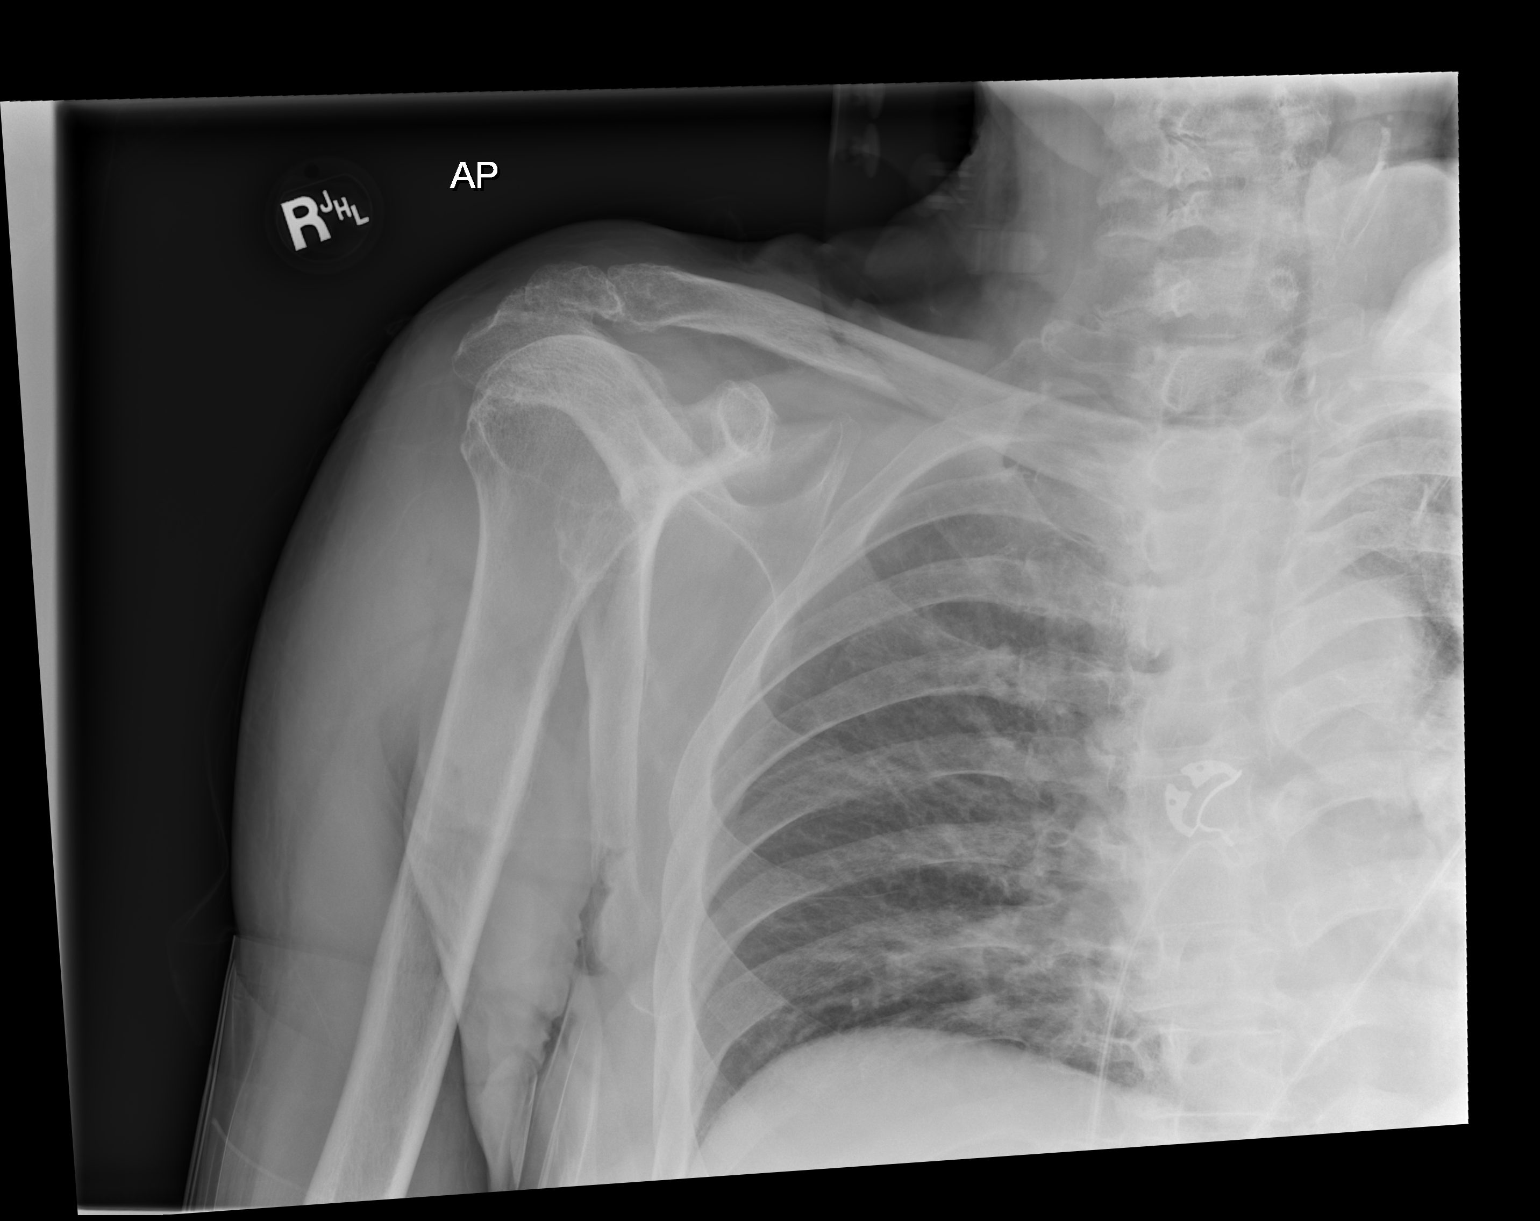

[3 of 3 positions shown; findings below may reference images not displayed]

FINDINGS: Internal rotation, external rotation, transscapular views of the
right shoulder are obtained. No acute fracture, subluxation, or
dislocation. Significant osteoarthritis of the acromioclavicular and
glenohumeral joints. Os acromiale incidentally noted. Visualized
portions of the right chest are clear.
IMPRESSION: 1. Significant right shoulder osteoarthritis.  No acute fracture.

## 2021-02-01 IMAGING — CT CT HEAD W/O CM
3 series · 14 of 47 positions shown, 16 images · non-contrast
Comparison: MRI head [DATE], CT head [DATE]

CLINICAL DATA: Poly trauma, history of dementia.  Found down

EXAM:
CT HEAD WITHOUT CONTRAST
CT CERVICAL SPINE WITHOUT CONTRAST
TECHNIQUE: Multidetector CT imaging of the head and cervical spine was
performed following the standard protocol without intravenous
contrast. Multiplanar CT image reconstructions of the cervical spine
were also generated.

[Series 3: head wo · axial · 0.44mm/px · z∈[-120,+20]mm · 8 of 34 slices shown, 10 images]
[im 3/34  brain]
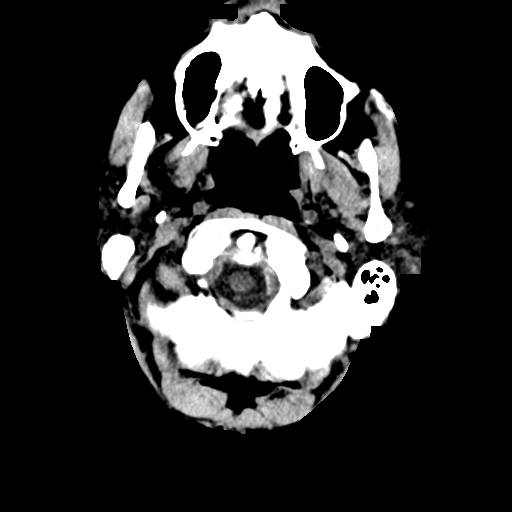
[im 3/34  bone]
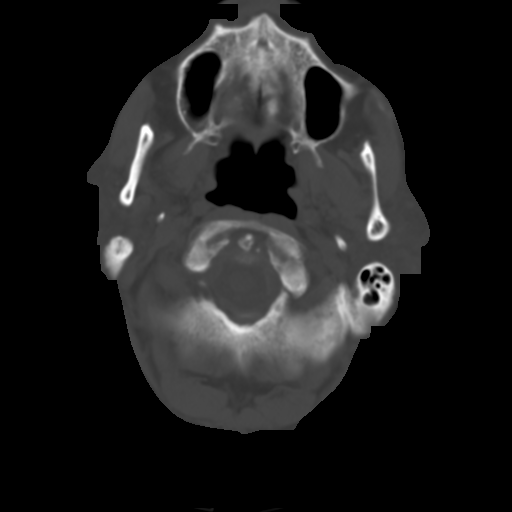
[im 7/34  brain]
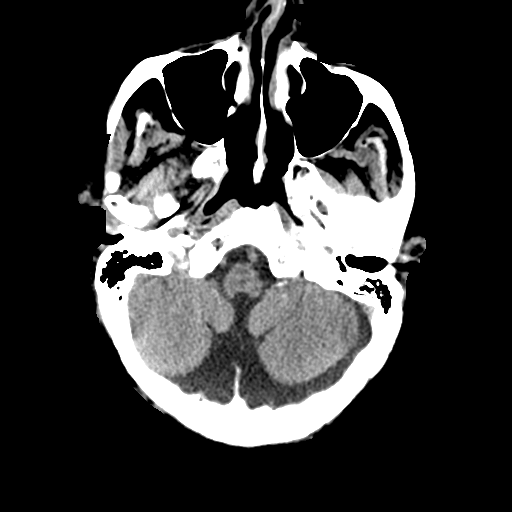
[im 11/34  brain]
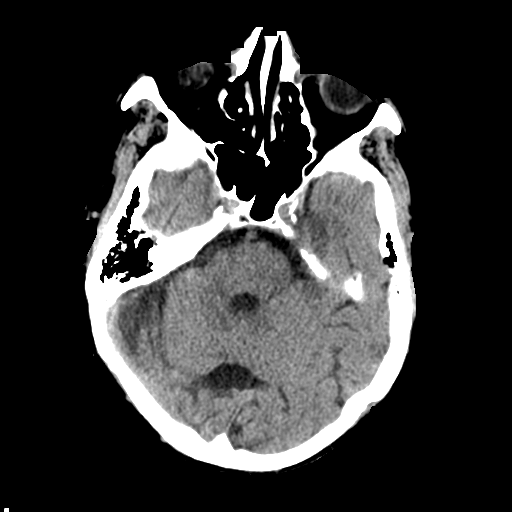
[im 15/34  brain]
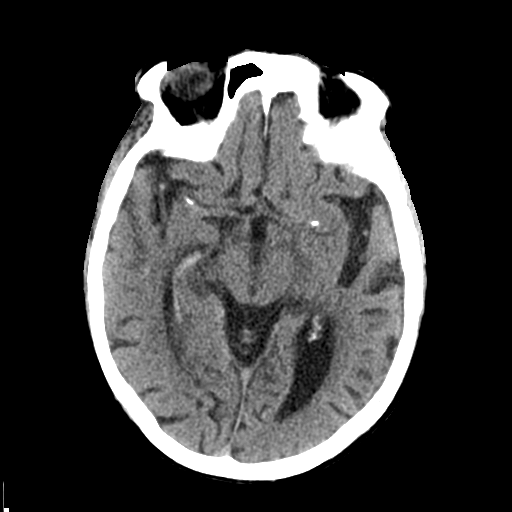
[im 19/34  brain]
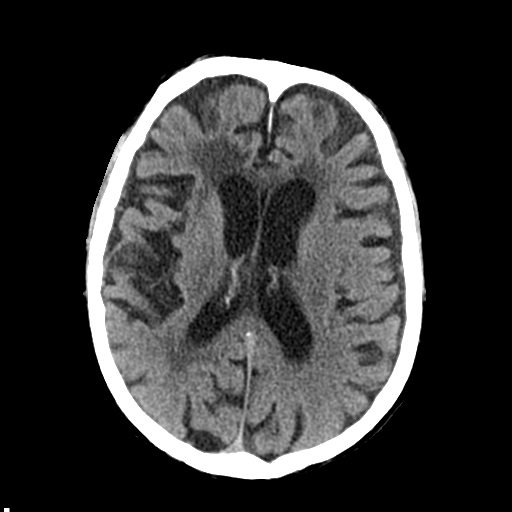
[im 19/34  bone]
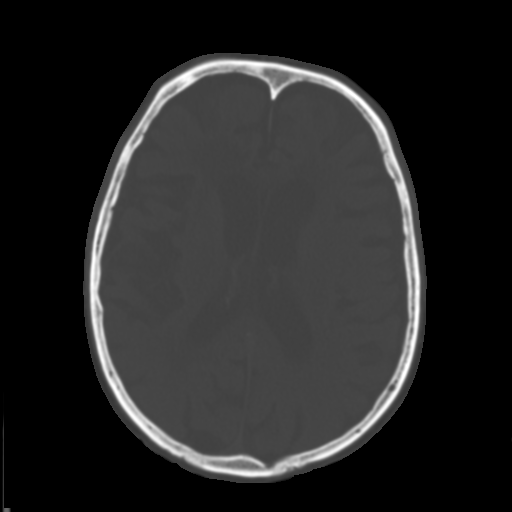
[im 23/34  brain]
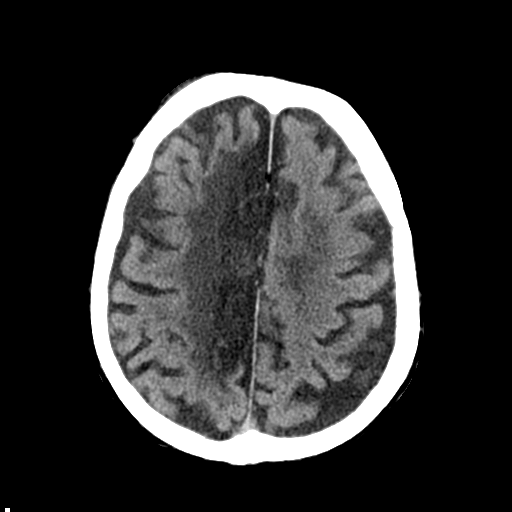
[im 27/34  brain]
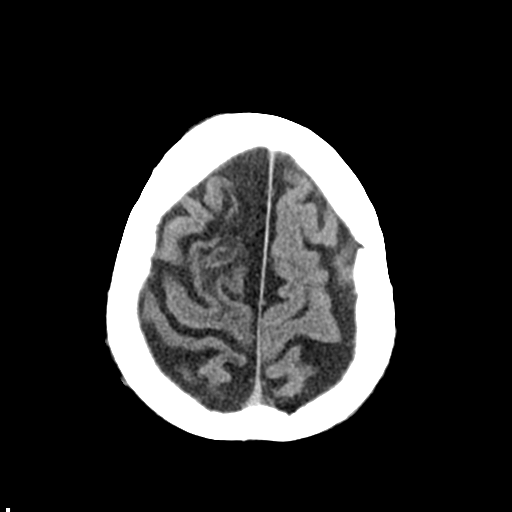
[im 31/34  brain]
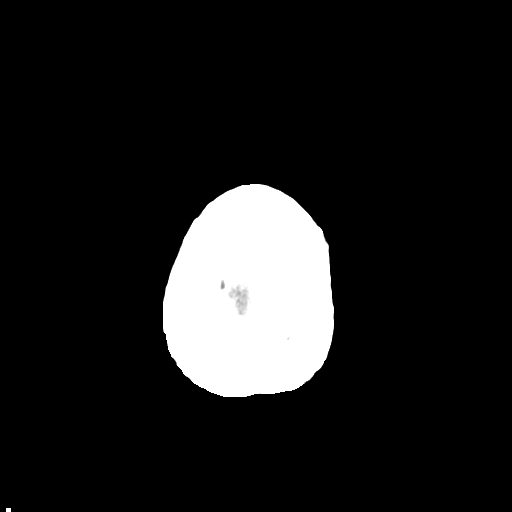

[Series 5: coronal soft tissue · coronal · 0.32mm/px · 3 of 69 slices shown]
[im 23/69  brain]
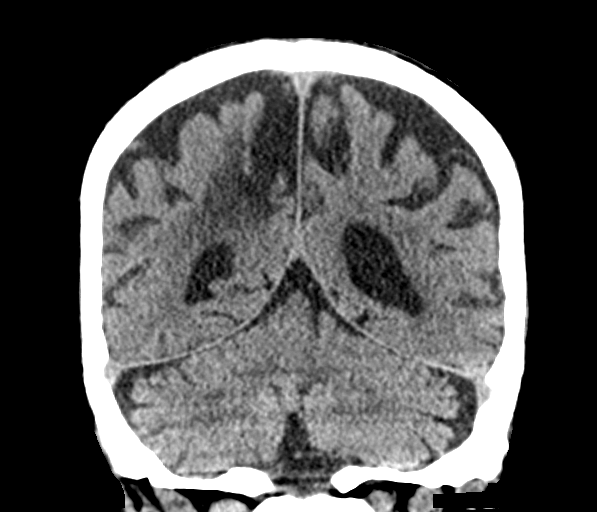
[im 31/69  brain]
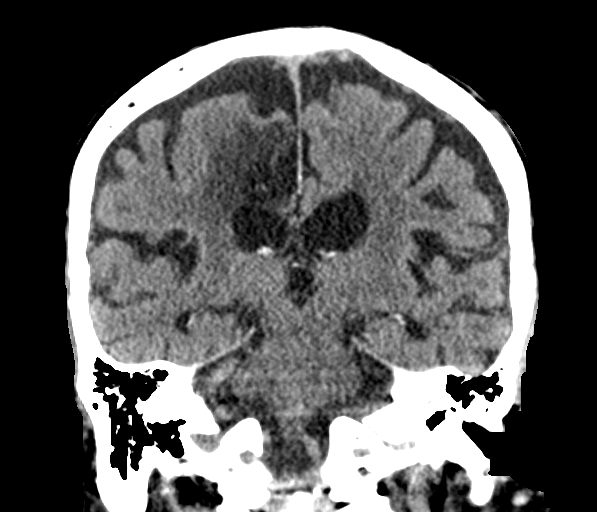
[im 38/69  brain]
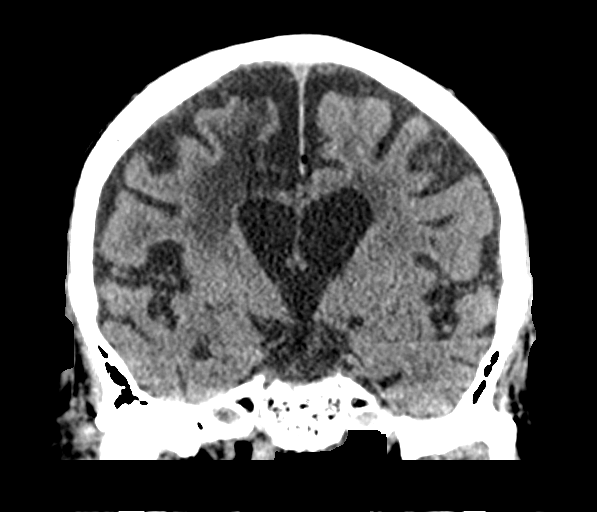

[Series 6: sagittal soft tissue · sagittal · 0.32mm/px · 3 of 59 slices shown]
[im 20/59  brain]
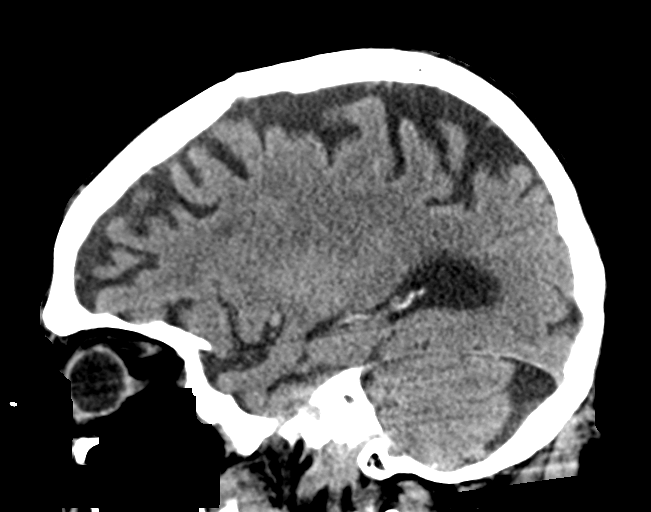
[im 30/59  brain]
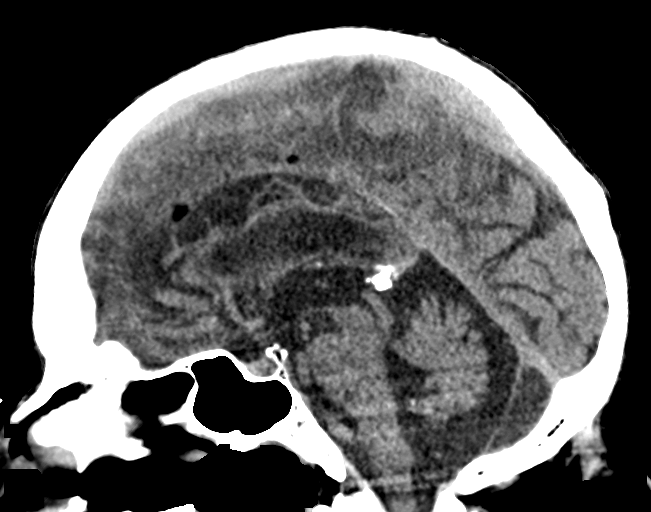
[im 39/59  brain]
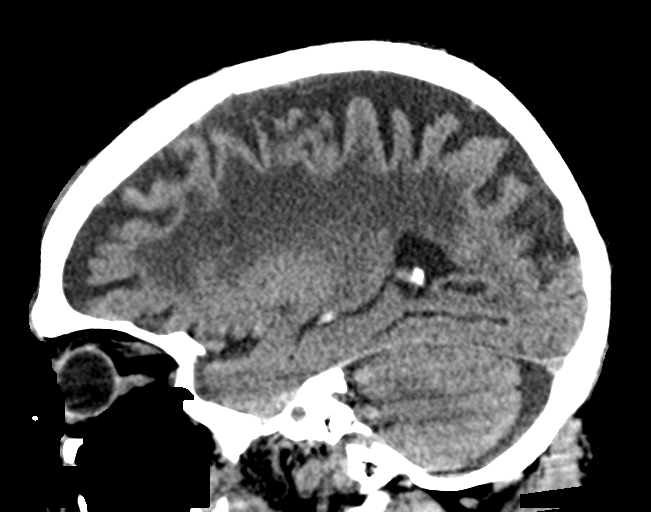

[14 of 47 positions shown; findings below may reference images not displayed]

FINDINGS: CT HEAD FINDINGS

BRAIN:
BRAIN
Cerebral ventricle sizes are concordant with the degree of cerebral
volume loss. Patchy and confluent areas of decreased attenuation are
noted throughout the deep and periventricular white matter of the
cerebral hemispheres bilaterally, compatible with chronic
microvascular ischemic disease. Chronic right ACA infarction.

No evidence of large-territorial acute infarction. No parenchymal
hemorrhage. No mass lesion. No extra-axial collection.

No mass effect or midline shift. No hydrocephalus. Basilar cisterns
are patent.

Vascular: No hyperdense vessel. Atherosclerotic calcifications are
present within the cavernous internal carotid arteries.

Skull: No acute fracture or focal lesion.

Sinuses/Orbits: Right maxillary mucosal polyp. Paranasal sinuses and
mastoid air cells are clear. The orbits are unremarkable.

Other: Right periorbital subcutaneus soft tissue edema. No large
hematoma formation. Right frontal scalp subcutaneus soft tissue
edema with trace scalp hematoma. There is a 1 cm right thyroid gland
hypodensity. Not clinically significant; no follow-up imaging
recommended (ref: [HOSPITAL]. [DATE]): 143-50).

CT CERVICAL SPINE FINDINGS

Alignment: Normal.

Skull base and vertebrae: Multilevel degenerative changes of the
spine. No acute fracture. No aggressive appearing focal osseous
lesion or focal pathologic process.

Soft tissues and spinal canal: No prevertebral fluid or swelling. No
visible canal hematoma.

Upper chest: Unremarkable.

Other: None.
IMPRESSION: 1. No acute intracranial abnormality.
2. No acute displaced fracture or traumatic listhesis of the
cervical spine.

## 2021-02-01 NOTE — ED Provider Notes (Signed)
[facility] DEPT Provider Note   CSN: [id number] Arrival date & time: 02/01/21  1825     History Chief Complaint  Patient presents with   Lytle Michaels    Jeffery Haynes is a 87 y.o. male.  HPI Adult male with diabetes, level 5 caveat.  Patient presents from nursing facility after an unwitnessed fall.  He was found on the ground, with hematoma in the right forehead.  Per nursing home report the patient is interacting typical manner.  Seemingly the patient may have complained of right shoulder pain, but he is unable to provide any details of what occurred, or describe pain or describe complaints to me. Her nursing home and EMS reports patient hemodynamically unremarkable in route.    Past Medical History:  Diagnosis Date   Alzheimer disease ([place])    Dementia ([place])    Diabetes mellitus without complication ([place])    Stroke [facility])     Patient Active Problem List   Diagnosis Date Noted   Seizure ([place]) 12/13/2020   Alzheimer's dementia ([place]) 06/07/2020   Fracture of femoral neck, left, closed ([place]) 06/07/2020   Closed fracture of left distal femur ([place]) 06/07/2020   Essential hypertension 06/07/2020   Diabetes mellitus type 2 in nonobese ([place]) 06/07/2020   Fracture of femoral neck, left ([place]) [id number]   Acute metabolic encephalopathy [id number]   Atrial fibrillation with RVR ([place]) 01/29/2020   Alzheimer disease ([place])    Dementia ([place])     No past surgical history on file.     Family History  Problem Relation Age of Onset   Seizures Neg Hx     Social History   Tobacco Use   Smoking status: Never   Smokeless tobacco: Never  Vaping Use   Vaping Use: Never used  Substance Use Topics   Alcohol use: Not Currently   Drug use: Never    Home Medications Prior to Admission medications   Medication Sig Start Date End Date Taking? Authorizing Provider  acetaminophen (TYLENOL) 325 MG tablet Take 650 mg by mouth 4 (four) times daily.     [provider]  amLODipine (NORVASC) 5 MG tablet Take 5 mg by mouth in the morning.    [provider]  citalopram (CELEXA) 10 MG tablet Take 15 mg by mouth in the morning.    [provider]  divalproex (DEPAKOTE SPRINKLE) 125 MG capsule Take 4 capsules (500 mg total) by mouth every 12 (twelve) hours. 12/27/20   Oswald Hillock, MD  donepezil (ARICEPT) 5 MG tablet Take 1 tablet (5 mg total) by mouth at bedtime. 02/01/20 01/31/21  Pokhrel, Corrie Mckusick, MD  insulin aspart (NOVOLOG) 100 UNIT/ML injection Inject 0-9 Units into the skin 3 (three) times daily with meals. Sliding scale insulin Less than 70 initiate hypoglycemia protocol 70-120  0 units 120-150 1 unit 151-200 2 units 201-250 3 units 251-300 5 units 301-350 7 units 351-400 9 units  Greater than 400 call MD 12/27/20   Oswald Hillock, MD  lidocaine (LIDODERM) 5 % Place 1 patch onto the skin daily. Remove & Discard patch within 12 hours or as directed by MD Patient taking differently: Place 1 patch onto the skin daily. 06/10/20   Georgette Shell, MD  metFORMIN (GLUCOPHAGE) 500 MG tablet Take 500 mg by mouth 2 (two) times daily.    [provider]  OLANZapine (ZYPREXA) 2.5 MG tablet Take 2.5 mg by mouth See admin instructions. At bedtime on Monday,Tuesday,Wednesday,Thursday and saturday 05/25/20   [provider]  polyethylene glycol powder (GLYCOLAX/MIRALAX) 17 GM/SCOOP powder Take 17 g by mouth daily.    [provider]  sennosides-docusate sodium (SENOKOT-S) 8.6-50 MG tablet Take 2 tablets by mouth 2 (two) times daily.    [provider]  tamsulosin (FLOMAX) 0.4 MG CAPS capsule Take 0.4 mg by mouth at bedtime.    [provider]  traZODone (DESYREL) 50 MG tablet Take 1 tablet (50 mg total) by mouth at bedtime as needed for sleep. 12/27/20   Meredeth Ide, MD    Allergies    Olanzapine  Review of Systems   Review of Systems  Unable to perform ROS: Dementia    Physical Exam Updated Vital Signs BP (!) 147/77   Pulse 70   Temp 97.8 F (36.6 C) (Oral)   Resp 16   SpO2 94%   Physical Exam Vitals and nursing note reviewed.  Constitutional:      General: He is not in acute distress.    Appearance: He is well-developed.     Comments: Withdrawn, frail-appearing elderly male  HENT:     Head: Normocephalic and atraumatic.  Eyes:     Conjunctiva/sclera: Conjunctivae normal.  Neck:     Comments: Cervical collar in place Cardiovascular:     Rate and Rhythm: Normal rate and regular rhythm.  Pulmonary:     Effort: Pulmonary effort is normal. No respiratory distress.     Breath sounds: No stridor.  Abdominal:     General: There is no distension.  Skin:    General: Skin is warm and dry.  Neurological:     Comments: Awake, face is symmetric.  Patient does move his extremities to command, but otherwise does not fully participate in interaction.  Psychiatric:     Comments: Impaired cognition impaired memory.    ED Results / Procedures / Treatments   Labs (all labs ordered are listed, but only abnormal results are displayed) Labs Reviewed - No data to display  EKG None  Radiology DG Shoulder Right  Result Date: 02/01/2021 CLINICAL DATA:  Dementia, fell EXAM: RIGHT SHOULDER - 2+ VIEW COMPARISON:  None. FINDINGS: Internal rotation, external rotation, transscapular views of the right shoulder are obtained. No acute fracture, subluxation, or dislocation. Significant osteoarthritis of the acromioclavicular and glenohumeral joints. Os acromiale incidentally noted. Visualized portions of the right chest are clear. IMPRESSION: 1. Significant right shoulder osteoarthritis.  No acute fracture. Electronically Signed   By: Sharlet Salina M.D.   On: 02/01/2021 20:19   CT Head Wo Contrast  Result Date: 02/01/2021 CLINICAL DATA:  Poly trauma, history of dementia.  Found down EXAM: CT HEAD WITHOUT CONTRAST CT CERVICAL SPINE WITHOUT CONTRAST TECHNIQUE:  Multidetector CT imaging of the head and cervical spine was performed following the standard protocol without intravenous contrast. Multiplanar CT image reconstructions of the cervical spine were also generated. COMPARISON:  MRI head 12/13/2020, CT head 12/12/2020 FINDINGS: CT HEAD FINDINGS BRAIN: BRAIN Cerebral ventricle sizes are concordant with the degree of cerebral volume loss. Patchy and confluent areas of decreased attenuation are noted throughout the deep and periventricular white matter of the cerebral hemispheres bilaterally, compatible with chronic microvascular ischemic disease. Chronic right ACA infarction. No evidence of large-territorial acute infarction. No parenchymal hemorrhage. No mass lesion. No extra-axial collection. No mass effect or midline shift. No hydrocephalus. Basilar cisterns are patent. Vascular: No hyperdense vessel. Atherosclerotic calcifications are present within the cavernous internal carotid arteries. Skull: No acute fracture or focal lesion. Sinuses/Orbits: Right maxillary mucosal polyp.  Paranasal sinuses and mastoid air cells are clear. The orbits are unremarkable. Other: Right periorbital subcutaneus soft tissue edema. No large hematoma formation. Right frontal scalp subcutaneus soft tissue edema with trace scalp hematoma. There is a 1 cm right thyroid gland hypodensity. Not clinically significant; no follow-up imaging recommended (ref: J Am Coll Radiol. 2015 Feb;12(2): 143-50). CT CERVICAL SPINE FINDINGS Alignment: Normal. Skull base and vertebrae: Multilevel degenerative changes of the spine. No acute fracture. No aggressive appearing focal osseous lesion or focal pathologic process. Soft tissues and spinal canal: No prevertebral fluid or swelling. No visible canal hematoma. Upper chest: Unremarkable. Other: None. IMPRESSION: 1. No acute intracranial abnormality. 2. No acute displaced fracture or traumatic listhesis of the cervical spine. Electronically Signed   By: Iven Finn M.D.   On: 02/01/2021 21:06   CT Cervical Spine Wo Contrast  Result Date: 02/01/2021 CLINICAL DATA:  Poly trauma, history of dementia.  Found down EXAM: CT HEAD WITHOUT CONTRAST CT CERVICAL SPINE WITHOUT CONTRAST TECHNIQUE: Multidetector CT imaging of the head and cervical spine was performed following the standard protocol without intravenous contrast. Multiplanar CT image reconstructions of the cervical spine were also generated. COMPARISON:  MRI head 12/13/2020, CT head 12/12/2020 FINDINGS: CT HEAD FINDINGS BRAIN: BRAIN Cerebral ventricle sizes are concordant with the degree of cerebral volume loss. Patchy and confluent areas of decreased attenuation are noted throughout the deep and periventricular white matter of the cerebral hemispheres bilaterally, compatible with chronic microvascular ischemic disease. Chronic right ACA infarction. No evidence of large-territorial acute infarction. No parenchymal hemorrhage. No mass lesion. No extra-axial collection. No mass effect or midline shift. No hydrocephalus. Basilar cisterns are patent. Vascular: No hyperdense vessel. Atherosclerotic calcifications are present within the cavernous internal carotid arteries. Skull: No acute fracture or focal lesion. Sinuses/Orbits: Right maxillary mucosal polyp. Paranasal sinuses and mastoid air cells are clear. The orbits are unremarkable. Other: Right periorbital subcutaneus soft tissue edema. No large hematoma formation. Right frontal scalp subcutaneus soft tissue edema with trace scalp hematoma. There is a 1 cm right thyroid gland hypodensity. Not clinically significant; no follow-up imaging recommended (ref: J Am Coll Radiol. 2015 Feb;12(2): 143-50). CT CERVICAL SPINE FINDINGS Alignment: Normal. Skull base and vertebrae: Multilevel degenerative changes of the spine. No acute fracture. No aggressive appearing focal osseous lesion or focal pathologic process. Soft tissues and spinal canal: No prevertebral fluid or  swelling. No visible canal hematoma. Upper chest: Unremarkable. Other: None. IMPRESSION: 1. No acute intracranial abnormality. 2. No acute displaced fracture or traumatic listhesis of the cervical spine. Electronically Signed   By: Iven Finn M.D.   On: 02/01/2021 21:06    Procedures Procedures   Medications Ordered in ED Medications - No data to display  ED Course  I have reviewed the triage vital signs and the nursing notes.  Pertinent labs & imaging results that were available during my care of the patient were reviewed by me and considered in my medical decision making (see chart for details).   9:24 PM Patient in no distress, no new complaints, CTs reviewed, no acute intracranial injury, no fracture, no notable findings.  With no new complaints, no obvious neuro complaints patient appropriate for discharge to his nursing facility. MDM Rules/Calculators/A&P MDM Number of Diagnoses or Management Options Fall, initial encounter: new, needed workup Injury of head, initial encounter: new, needed workup   Amount and/or Complexity of Data Reviewed Tests in the radiology section of CPT: ordered and reviewed Decide to obtain previous medical records or to  obtain history from someone other than the patient: yes Obtain history from someone other than the patient: yes Review and summarize past medical records: yes Independent visualization of images, tracings, or specimens: yes  Risk of Complications, Morbidity, and/or Mortality Presenting problems: high Diagnostic procedures: high Management options: high  Critical Care Total time providing critical care: < 30 minutes  Patient Progress Patient progress: stable   Final Clinical Impression(s) / ED Diagnoses Final diagnoses:  Fall, initial encounter  Injury of head, initial encounter    Rx / DC Orders ED Discharge Orders     None        Carmin Muskrat, MD 02/01/21 2124

## 2021-02-01 NOTE — ED Notes (Signed)
Patient's wife updated on patient's status and results

## 2021-02-01 NOTE — ED Notes (Signed)
PTAR left with patient, wife updated

## 2021-02-01 NOTE — ED Triage Notes (Signed)
Patient arrives from [facility] s/p unwitnessed fall via EMS. Patient with hx of dementia, alert to self and event only. Patient was found down at 1700, last seen at 1600. Patient has a hematoma to the right forehead and right shoulder pain that is possivbly chronic, no deformity. C-collar in place.

## 2021-02-01 NOTE — Discharge Instructions (Signed)
As discussed, your evaluation today has been largely reassuring.  But, it is important that you monitor your condition carefully, and do not hesitate to return to the ED if you develop new, or concerning changes in your condition. ? ?Otherwise, please follow-up with your physician for appropriate ongoing care. ? ?

## 2021-02-01 NOTE — ED Notes (Signed)
VM left for patient's wife to update her on patient's status

## 2021-02-01 NOTE — ED Notes (Signed)
PTAR contacted for transport 

## 2021-03-02 ENCOUNTER — Other Ambulatory Visit: Payer: Self-pay

## 2021-03-02 ENCOUNTER — Emergency Department (HOSPITAL_COMMUNITY): Payer: Medicare Other

## 2021-03-02 ENCOUNTER — Emergency Department (HOSPITAL_COMMUNITY)
Admission: EM | Admit: 2021-03-02 | Discharge: 2021-03-03 | Disposition: A | Discharge status: Skilled Nursing Facility | Payer: Medicare Other | Attending: Emergency Medicine | Admitting: Emergency Medicine

## 2021-03-02 ENCOUNTER — Encounter (HOSPITAL_COMMUNITY): Payer: Self-pay

## 2021-03-02 DIAGNOSIS — W06XXXA Fall from bed, initial encounter: Secondary | ICD-10-CM | POA: Diagnosis not present

## 2021-03-02 DIAGNOSIS — E119 Type 2 diabetes mellitus without complications: Secondary | ICD-10-CM | POA: Insufficient documentation

## 2021-03-02 DIAGNOSIS — Z7984 Long term (current) use of oral hypoglycemic drugs: Secondary | ICD-10-CM | POA: Diagnosis not present

## 2021-03-02 DIAGNOSIS — Z79899 Other long term (current) drug therapy: Secondary | ICD-10-CM | POA: Insufficient documentation

## 2021-03-02 DIAGNOSIS — S199XXA Unspecified injury of neck, initial encounter: Secondary | ICD-10-CM | POA: Diagnosis present

## 2021-03-02 DIAGNOSIS — R531 Weakness: Secondary | ICD-10-CM | POA: Insufficient documentation

## 2021-03-02 DIAGNOSIS — S12191A Other nondisplaced fracture of second cervical vertebra, initial encounter for closed fracture: Secondary | ICD-10-CM | POA: Diagnosis not present

## 2021-03-02 DIAGNOSIS — Z794 Long term (current) use of insulin: Secondary | ICD-10-CM | POA: Insufficient documentation

## 2021-03-02 DIAGNOSIS — S0181XA Laceration without foreign body of other part of head, initial encounter: Secondary | ICD-10-CM | POA: Insufficient documentation

## 2021-03-02 DIAGNOSIS — I10 Essential (primary) hypertension: Secondary | ICD-10-CM | POA: Diagnosis not present

## 2021-03-02 DIAGNOSIS — G309 Alzheimer's disease, unspecified: Secondary | ICD-10-CM | POA: Diagnosis not present

## 2021-03-02 LAB — BASIC METABOLIC PANEL
Anion gap: 9 (ref 5–15)
BUN: 30 mg/dL — ABNORMAL HIGH (ref 8–23)
CO2: 24 mmol/L (ref 22–32)
Calcium: 8.7 mg/dL — ABNORMAL LOW (ref 8.9–10.3)
Chloride: 105 mmol/L (ref 98–111)
Creatinine, Ser: 0.77 mg/dL (ref 0.61–1.24)
GFR, Estimated: >60 mL/min (ref >60)
Glucose, Bld: 187 mg/dL — ABNORMAL HIGH (ref 70–99)
Potassium: 4.6 mmol/L (ref 3.5–5.1)
Sodium: 138 mmol/L (ref 135–145)

## 2021-03-02 LAB — CBC
HCT: 46.5 % (ref 39.0–52.0)
Hemoglobin: 13.5 g/dL (ref 13.0–17.0)
MCH: 28.7 pg (ref 26.0–34.0)
MCHC: 29 g/dL — ABNORMAL LOW (ref 30.0–36.0)
MCV: 98.9 fL (ref 80.0–100.0)
Platelets: 196 10*3/uL (ref 150–400)
RBC: 4.7 MIL/uL (ref 4.22–5.81)
RDW: 14.2 % (ref 11.5–15.5)
WBC: 8.9 10*3/uL (ref 4.0–10.5)
nRBC: 0 % (ref 0.0–0.2)

## 2021-03-02 IMAGING — CT CT CERVICAL SPINE W/O CM
3 of 4 series · 12 of 33 positions shown, 14 images · non-contrast
Comparison: CT head and cervical spine [DATE]. CTA head and
neck [DATE].

CLINICAL DATA: Fall.  Head and neck pain.

EXAM:
CT HEAD WITHOUT CONTRAST
CT MAXILLOFACIAL WITHOUT CONTRAST
CT CERVICAL SPINE WITHOUT CONTRAST
TECHNIQUE: Multidetector CT imaging of the head, cervical spine, and
maxillofacial structures were performed using the standard protocol
without intravenous contrast. Multiplanar CT image reconstructions
of the cervical spine and maxillofacial structures were also
generated.

[Series 6: orthogonal axials · axial · 0.23mm/px · z∈[-293,-163]mm · 4 of 117 slices shown, 5 images]
[im 20/117  soft-tissue]
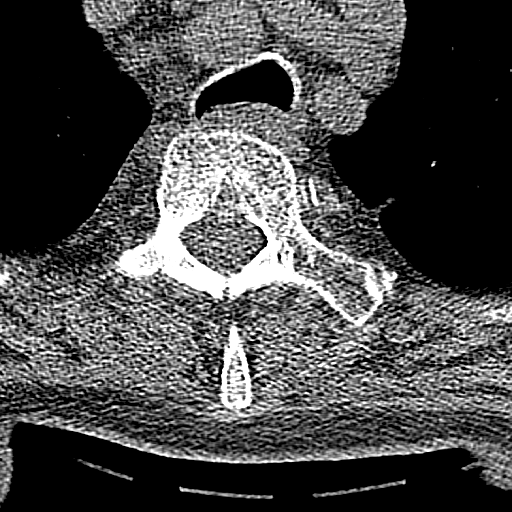
[im 20/117  bone]
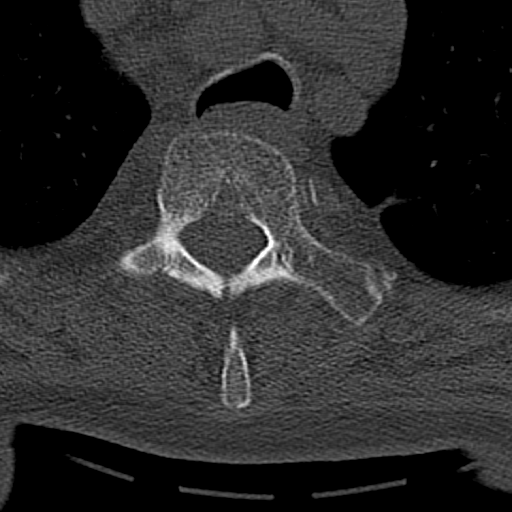
[im 39/117  bone]
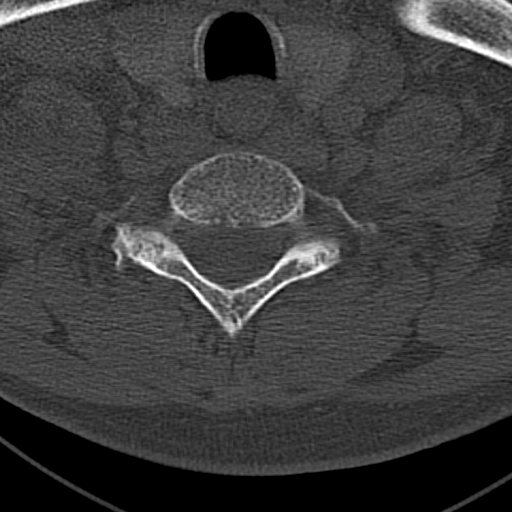
[im 78/117  bone]
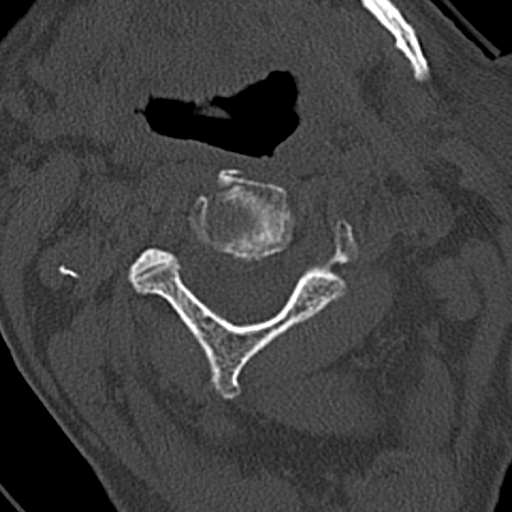
[im 97/117  bone]
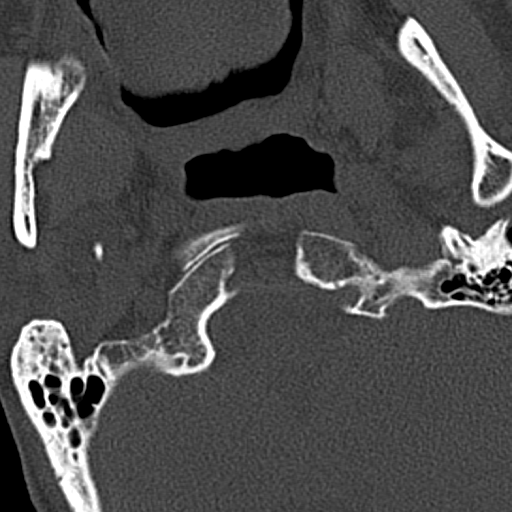

[Series 7: coronal bone · coronal · 0.29mm/px · 3 of 61 slices shown]
[im 14/61  bone]
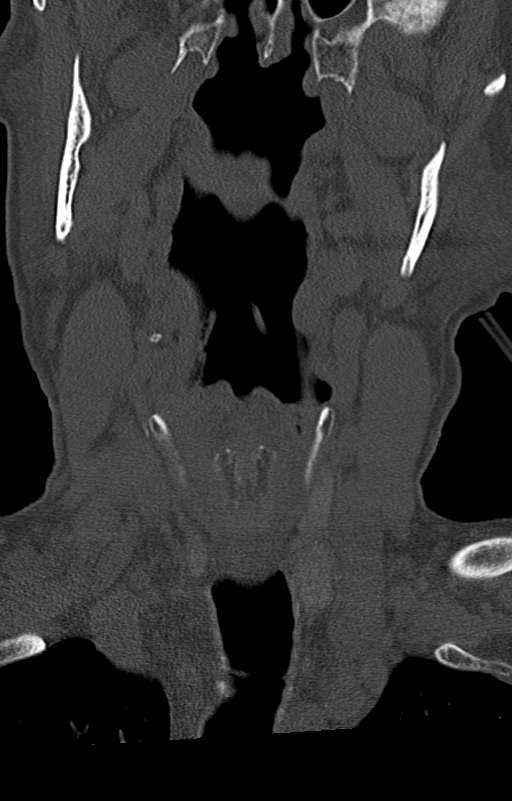
[im 25/61  bone]
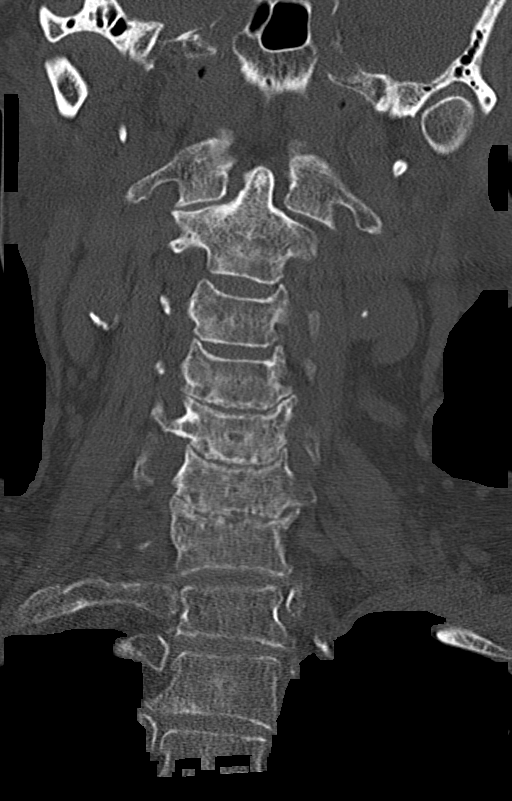
[im 36/61  bone]
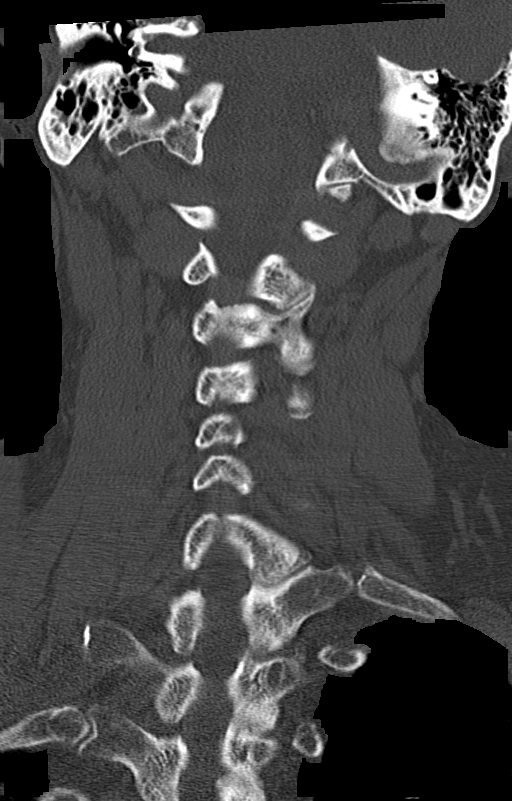

[Series 8: sagittal bone · sagittal · 0.30mm/px · 5 of 61 slices shown, 6 images]
[im 21/61  bone]
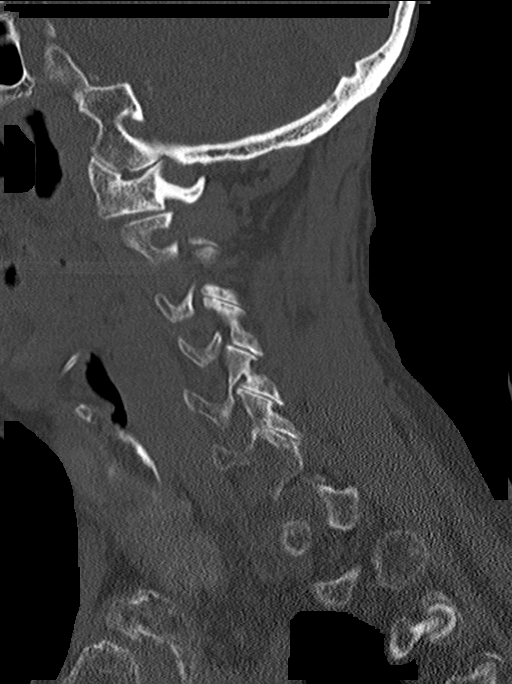
[im 26/61  bone]
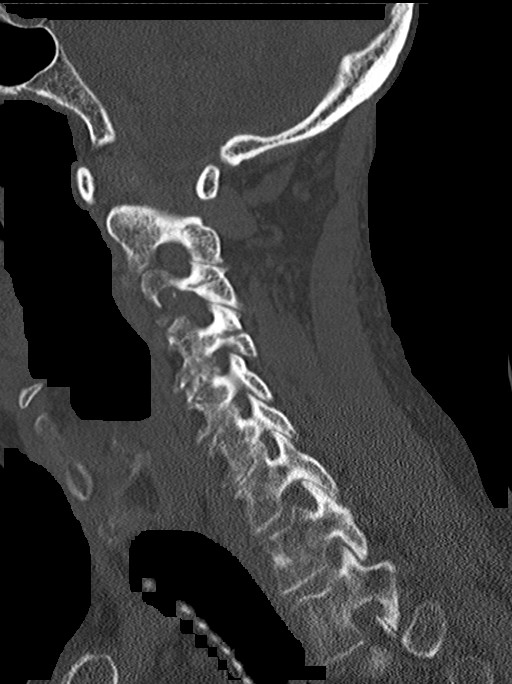
[im 31/61  soft-tissue]
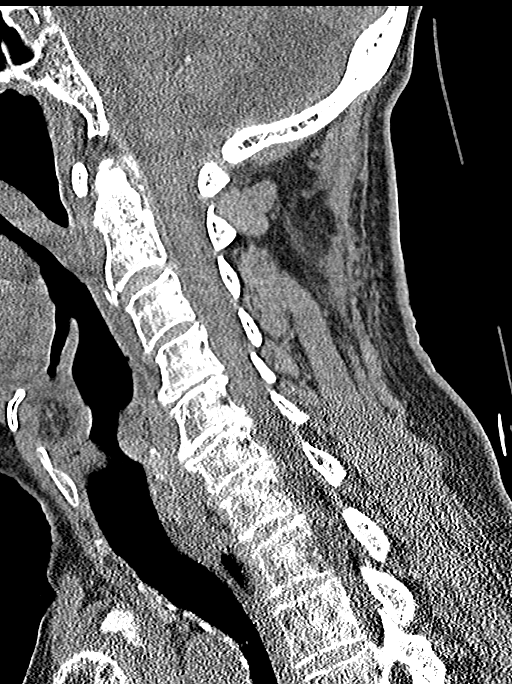
[im 31/61  bone]
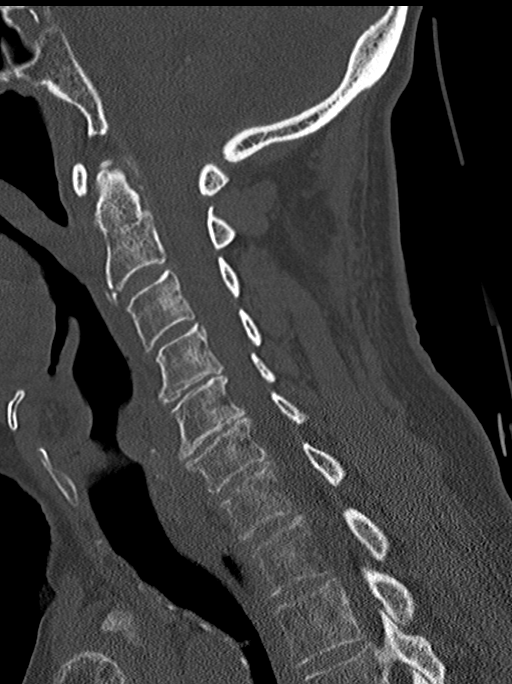
[im 36/61  bone]
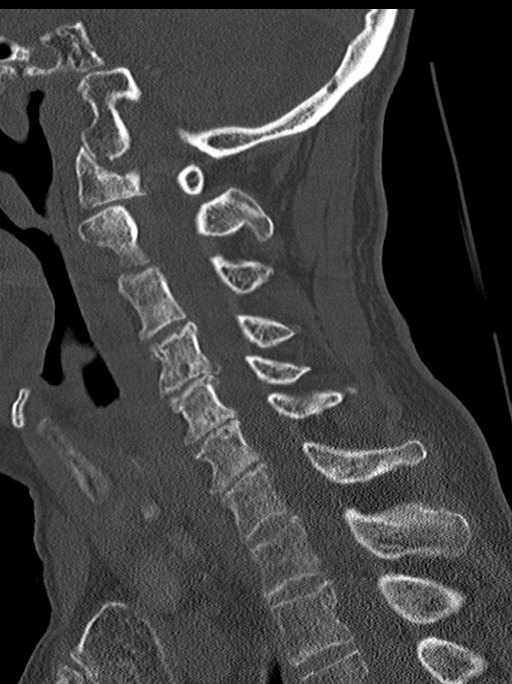
[im 41/61  bone]
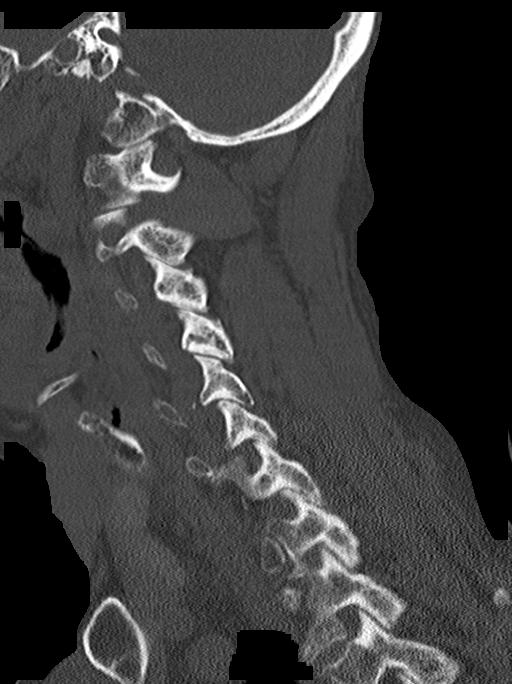

[12 of 33 positions shown; findings below may reference images not displayed]

FINDINGS: CT HEAD FINDINGS

Brain: There is no evidence of an acute infarct, intracranial
hemorrhage, mass, midline shift, or extra-axial fluid collection. A
large chronic right ACA infarct is again noted. Hypodensities
elsewhere in the cerebral white matter bilaterally are unchanged and
nonspecific but compatible with moderate chronic small vessel
ischemic disease. There is moderate cerebral atrophy.

Vascular: Calcified atherosclerosis at the skull base. No hyperdense
vessel.

Skull: No fracture or suspicious osseous lesion.

Other: Small right frontal scalp hematoma.

CT MAXILLOFACIAL FINDINGS

Osseous: No acute fracture, mandibular dislocation, or suspicious
osseous lesion.

Orbits: Bilateral cataract extraction.  No acute traumatic finding.

Sinuses: Tiny mucous retention cyst in the right maxillary sinus.
Trace right mastoid effusion.

Soft tissues: Carotid atherosclerosis. Two subcentimeter nodules in
the left parotid gland, unchanged from the prior CTA and potentially
reflecting benign intraparotid lymph nodes.

CT CERVICAL SPINE FINDINGS

Alignment: Straightening of the normal cervical lordosis. No
traumatic subluxation.

Skull base and vertebrae: New nondisplaced fracture involving the
anteroinferior corner of the C2 vertebral body through the base of
an osteophyte. No other acute fracture identified. No suspicious
osseous lesion.

Soft tissues and spinal canal: No prevertebral fluid or swelling. No
visible canal hematoma.

Disc levels: Moderate mid to lower cervical disc degeneration.
Mild-to-moderate spinal and neural foraminal stenosis at C4-5 and
C5-6.

Upper chest: Minimal left apical lung scarring.

Other: 1.2 cm right thyroid nodule for which no follow-up imaging
follow-up is recommended. Calcific atherosclerosis at the carotid
bifurcations.
IMPRESSION: 1. Acute nondisplaced fracture of the anteroinferior corner of the
C2 vertebral body through the base of an osteophyte.
2. No evidence of acute intracranial abnormality.
3. Small right frontal scalp hematoma.
4. No acute maxillofacial fracture.

## 2021-03-02 IMAGING — CT CT HEAD W/O CM
3 series · 14 of 47 positions shown, 16 images · non-contrast
Comparison: CT head and cervical spine [DATE]. CTA head and
neck [DATE].

CLINICAL DATA: Fall.  Head and neck pain.

EXAM:
CT HEAD WITHOUT CONTRAST
CT MAXILLOFACIAL WITHOUT CONTRAST
CT CERVICAL SPINE WITHOUT CONTRAST
TECHNIQUE: Multidetector CT imaging of the head, cervical spine, and
maxillofacial structures were performed using the standard protocol
without intravenous contrast. Multiplanar CT image reconstructions
of the cervical spine and maxillofacial structures were also
generated.

[Series 1: head wo · axial · 0.47mm/px · z∈[-118,+52]mm · 8 of 40 slices shown, 10 images]
[im 3/40  brain]
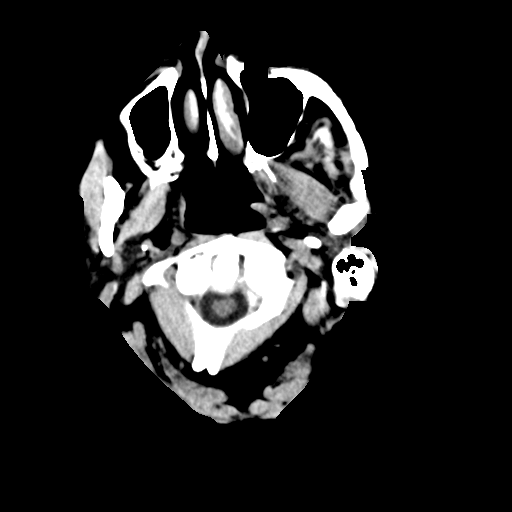
[im 3/40  bone]
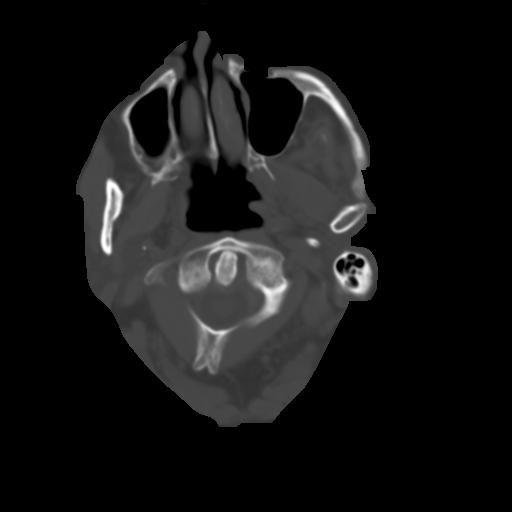
[im 9/40  brain]
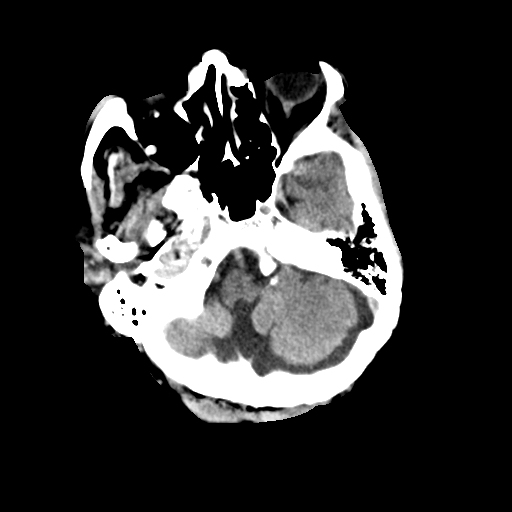
[im 13/40  brain]
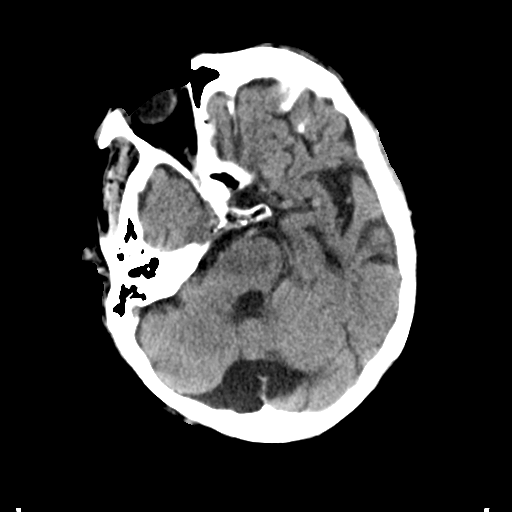
[im 18/40  brain]
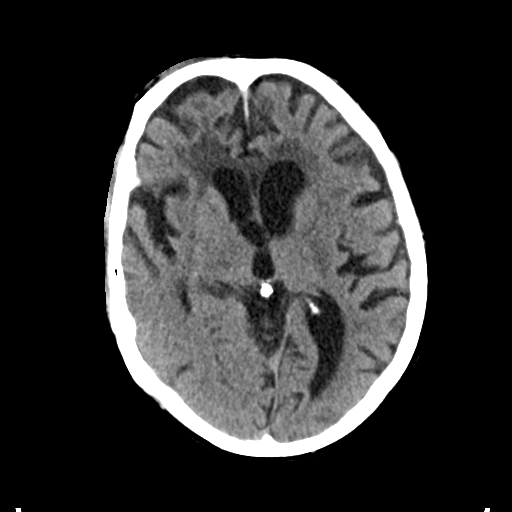
[im 22/40  brain]
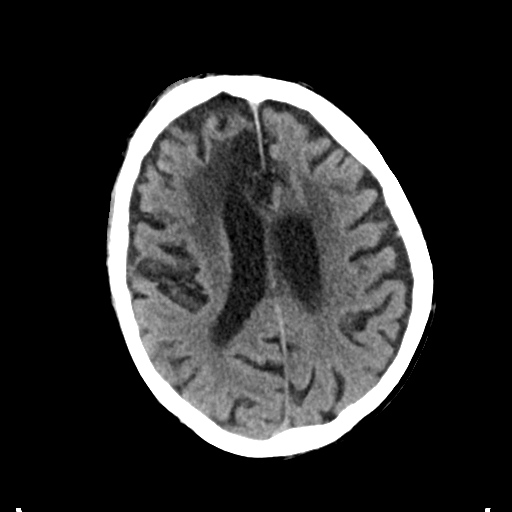
[im 22/40  bone]
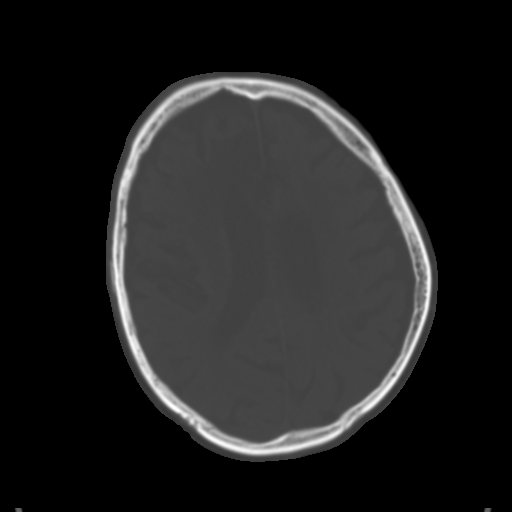
[im 27/40  brain]
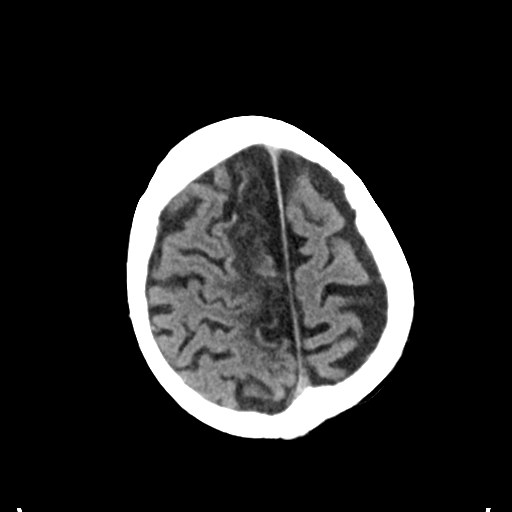
[im 31/40  brain]
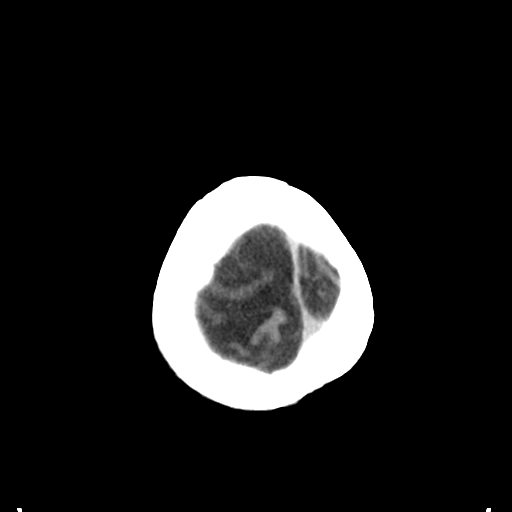
[im 37/40  brain]
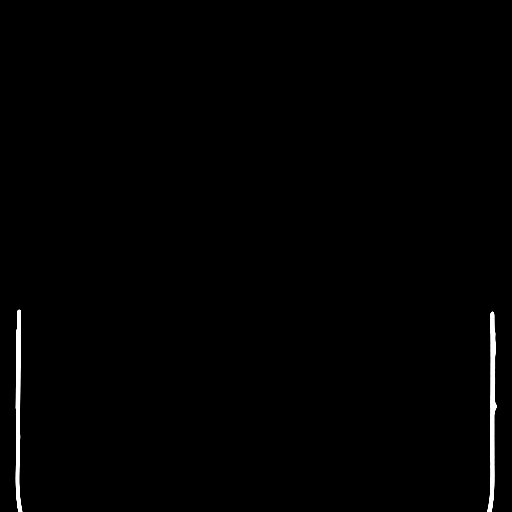

[Series 6: coronal soft tissue · coronal · 0.36mm/px · 3 of 69 slices shown]
[im 23/69  brain]
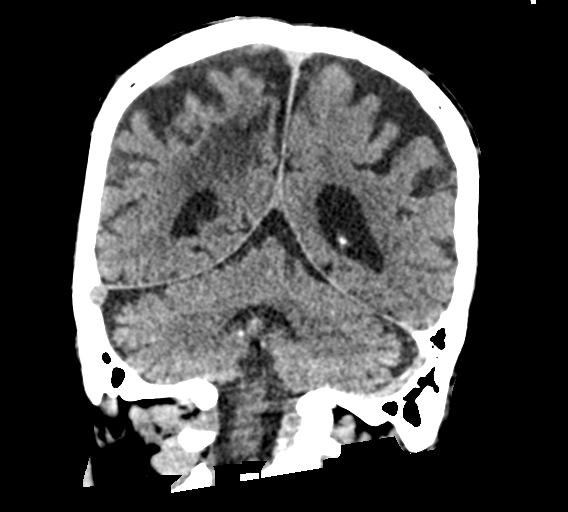
[im 31/69  brain]
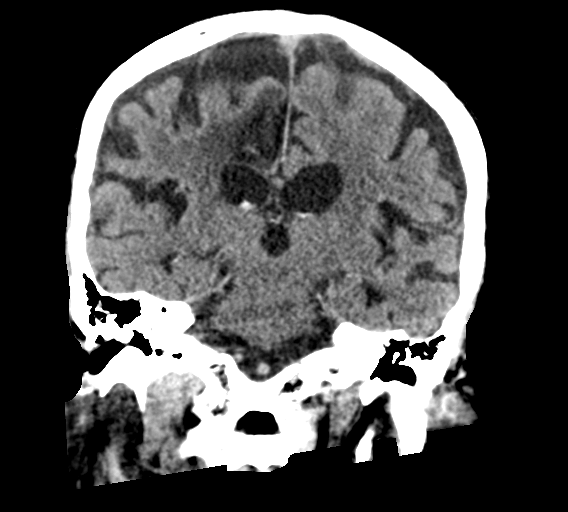
[im 38/69  brain]
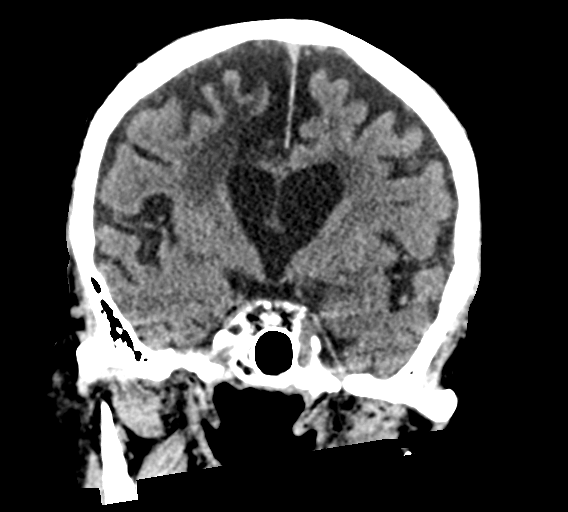

[Series 7: sagittal soft tissue · sagittal · 0.36mm/px · 3 of 65 slices shown]
[im 24/65  brain]
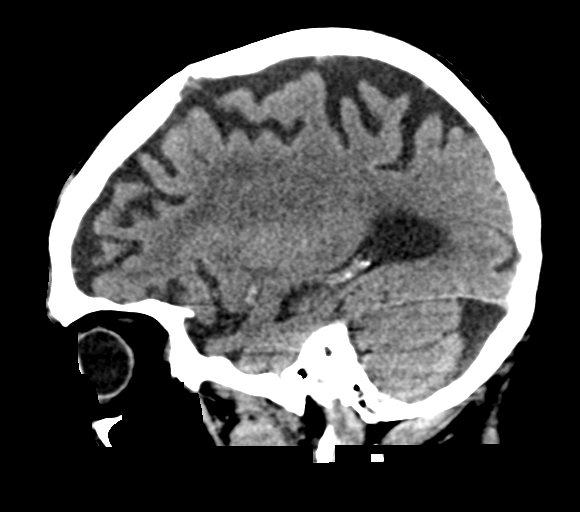
[im 32/65  brain]
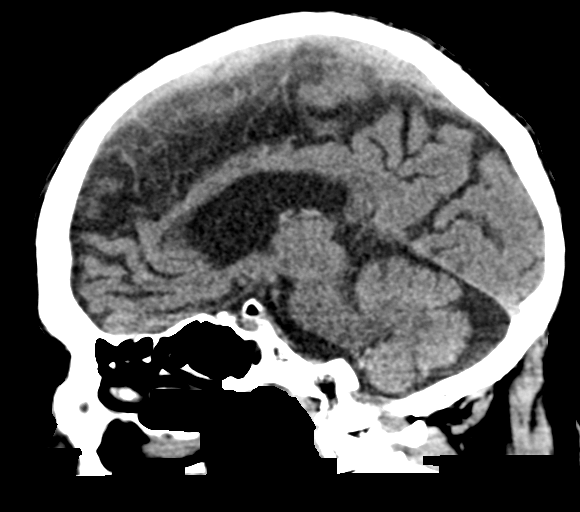
[im 40/65  brain]
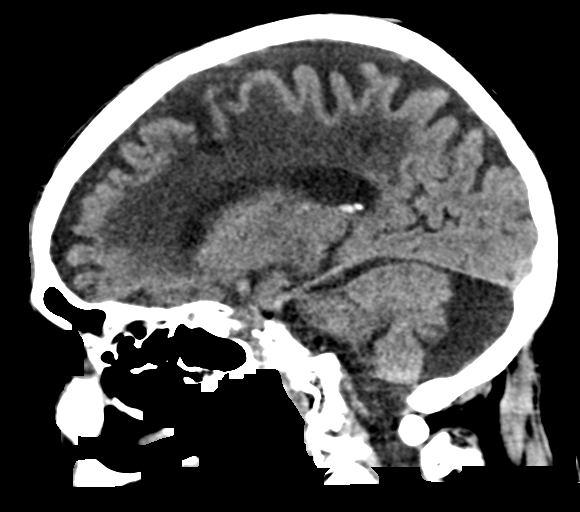

[14 of 47 positions shown; findings below may reference images not displayed]

FINDINGS: CT HEAD FINDINGS

Brain: There is no evidence of an acute infarct, intracranial
hemorrhage, mass, midline shift, or extra-axial fluid collection. A
large chronic right ACA infarct is again noted. Hypodensities
elsewhere in the cerebral white matter bilaterally are unchanged and
nonspecific but compatible with moderate chronic small vessel
ischemic disease. There is moderate cerebral atrophy.

Vascular: Calcified atherosclerosis at the skull base. No hyperdense
vessel.

Skull: No fracture or suspicious osseous lesion.

Other: Small right frontal scalp hematoma.

CT MAXILLOFACIAL FINDINGS

Osseous: No acute fracture, mandibular dislocation, or suspicious
osseous lesion.

Orbits: Bilateral cataract extraction.  No acute traumatic finding.

Sinuses: Tiny mucous retention cyst in the right maxillary sinus.
Trace right mastoid effusion.

Soft tissues: Carotid atherosclerosis. Two subcentimeter nodules in
the left parotid gland, unchanged from the prior CTA and potentially
reflecting benign intraparotid lymph nodes.

CT CERVICAL SPINE FINDINGS

Alignment: Straightening of the normal cervical lordosis. No
traumatic subluxation.

Skull base and vertebrae: New nondisplaced fracture involving the
anteroinferior corner of the C2 vertebral body through the base of
an osteophyte. No other acute fracture identified. No suspicious
osseous lesion.

Soft tissues and spinal canal: No prevertebral fluid or swelling. No
visible canal hematoma.

Disc levels: Moderate mid to lower cervical disc degeneration.
Mild-to-moderate spinal and neural foraminal stenosis at C4-5 and
C5-6.

Upper chest: Minimal left apical lung scarring.

Other: 1.2 cm right thyroid nodule for which no follow-up imaging
follow-up is recommended. Calcific atherosclerosis at the carotid
bifurcations.
IMPRESSION: 1. Acute nondisplaced fracture of the anteroinferior corner of the
C2 vertebral body through the base of an osteophyte.
2. No evidence of acute intracranial abnormality.
3. Small right frontal scalp hematoma.
4. No acute maxillofacial fracture.

## 2021-03-02 IMAGING — CT CT T SPINE W/O CM
3 of 4 series · 9 of 33 positions shown, 11 images · non-contrast
Comparison: Head and neck CTA [DATE]. Chest radiograph
[DATE]. Lumbar spine radiographs [DATE].

CLINICAL DATA: Back trauma.  Fall.

EXAM:
CT THORACIC SPINE WITHOUT CONTRAST
TECHNIQUE: Multidetector CT images of the thoracic were obtained using the
standard protocol without intravenous contrast.

[Series 4: t spine st · axial · 0.42mm/px · z∈[-384,-384]mm · 1 of 190 slices shown, 2 images]
[im 95/190  soft-tissue]
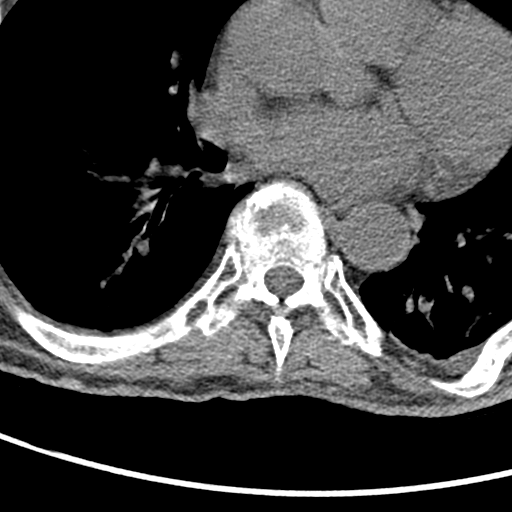
[im 95/190  bone]
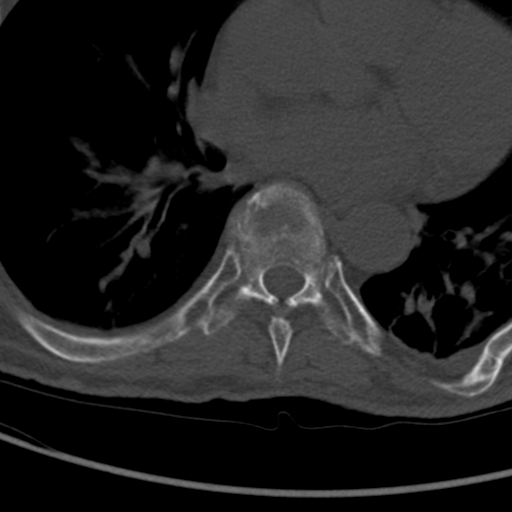

[Series 8: coronal bone · coronal · 0.55mm/px · 3 of 84 slices shown]
[im 17/84  bone]
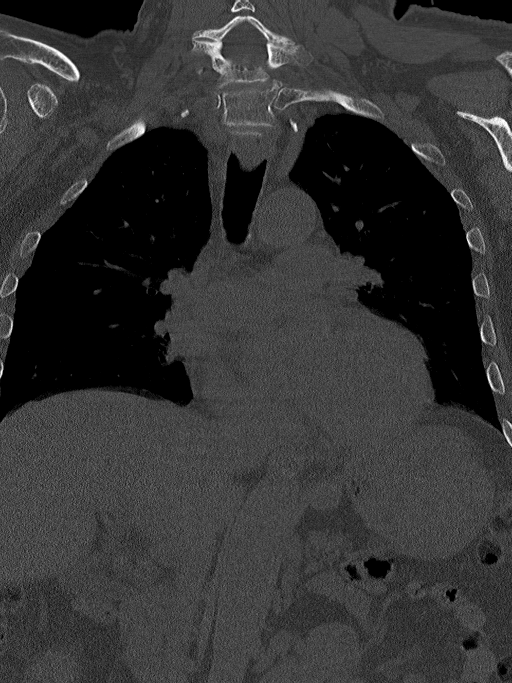
[im 34/84  bone]
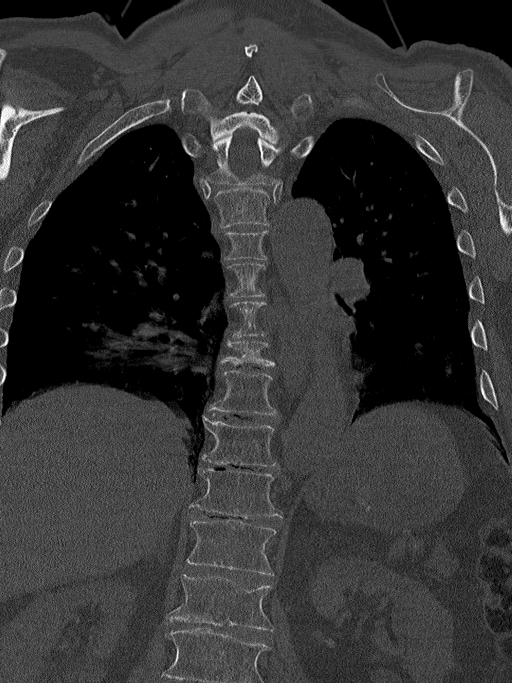
[im 50/84  bone]
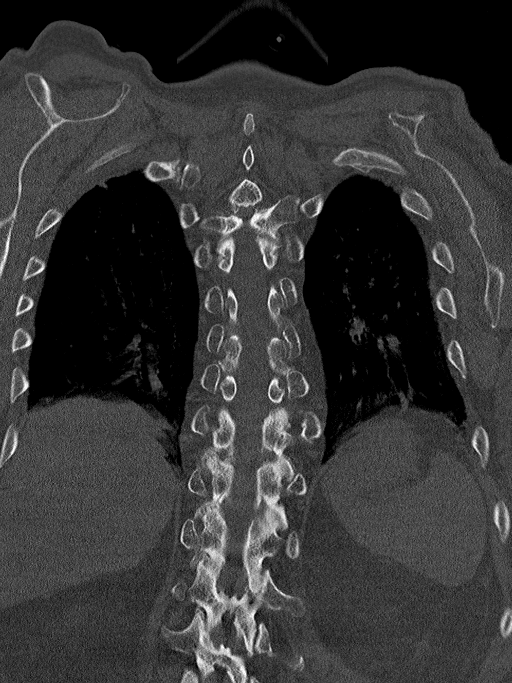

[Series 9: sagittal bone · sagittal · 0.33mm/px · 5 of 143 slices shown, 6 images]
[im 48/143  bone]
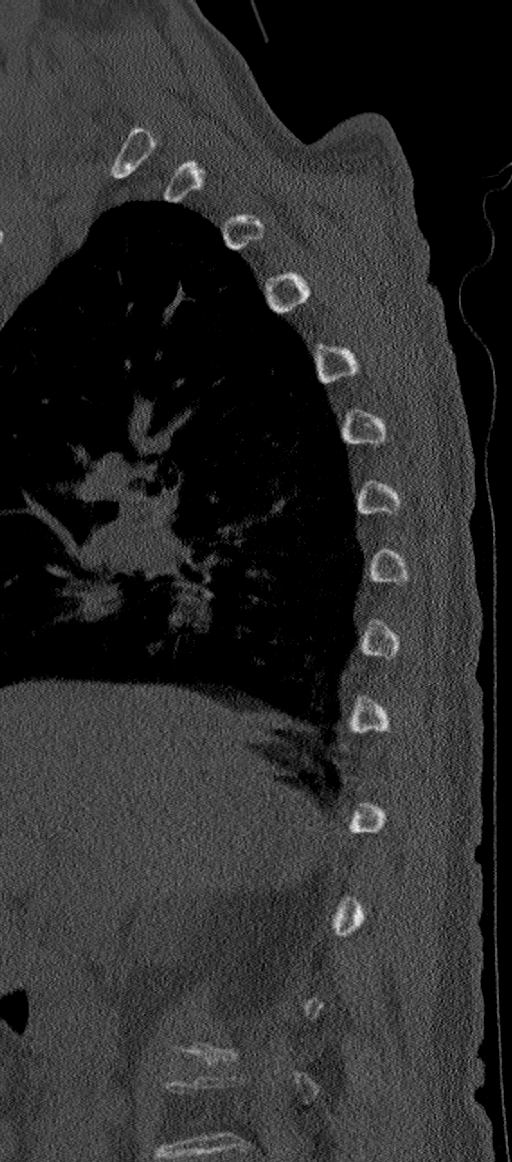
[im 60/143  bone]
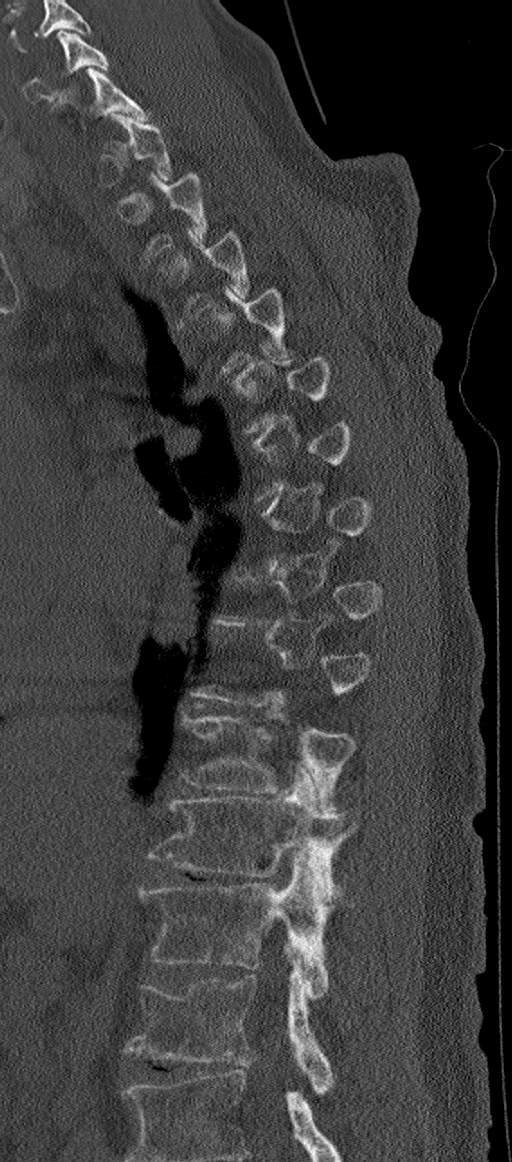
[im 72/143  soft-tissue]
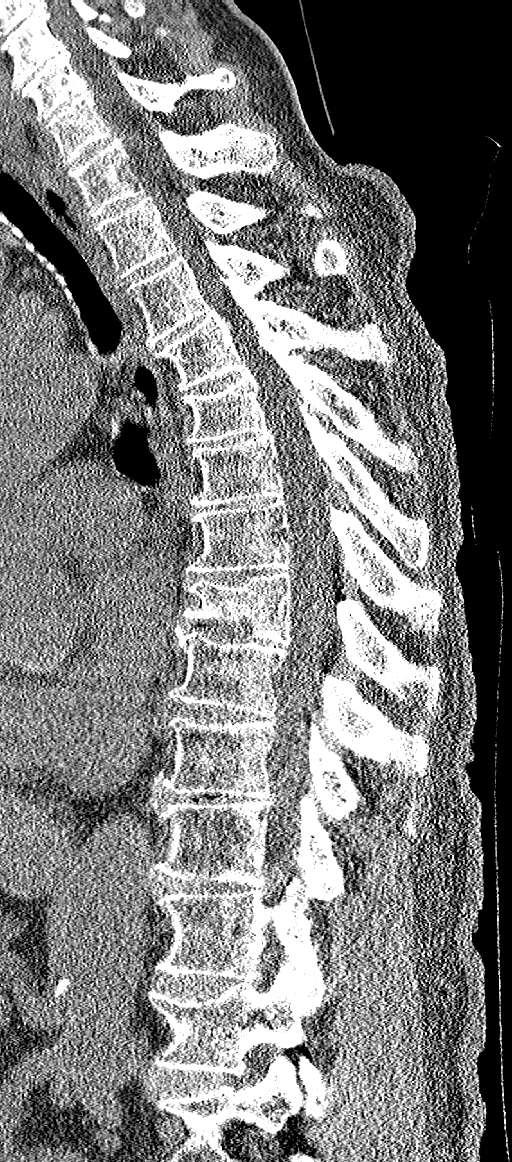
[im 72/143  bone]
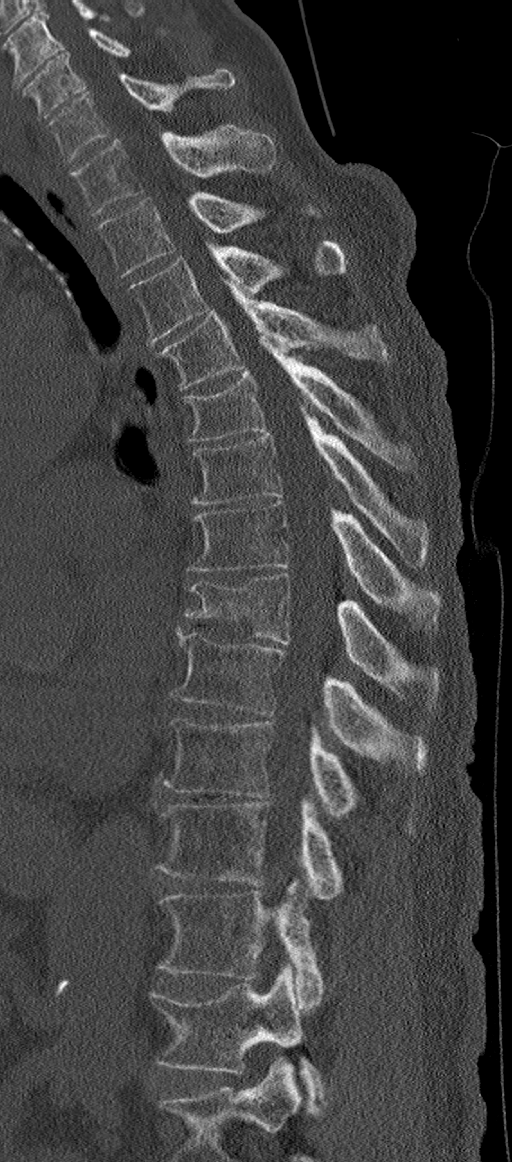
[im 83/143  bone]
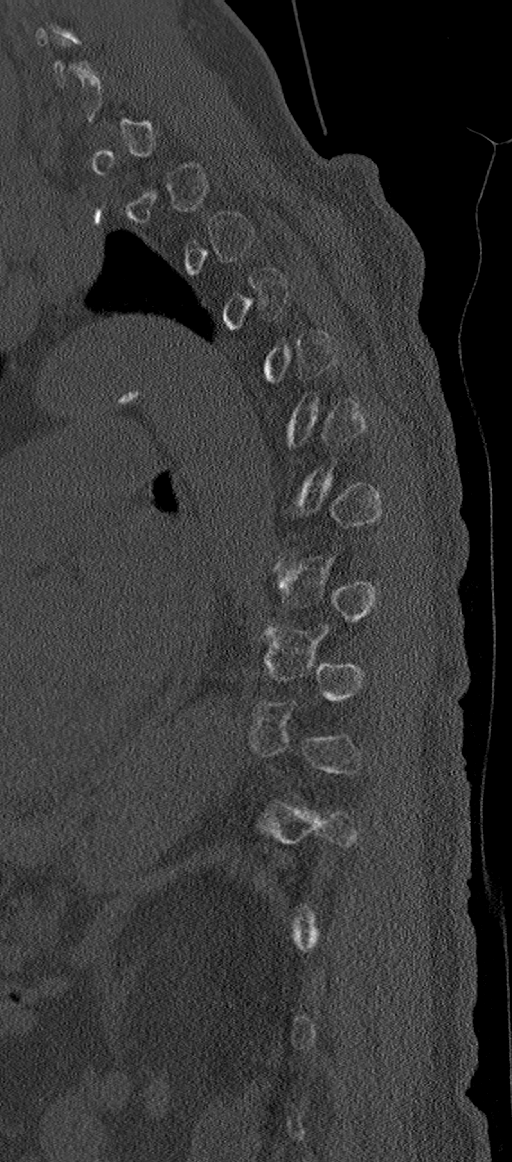
[im 95/143  bone]
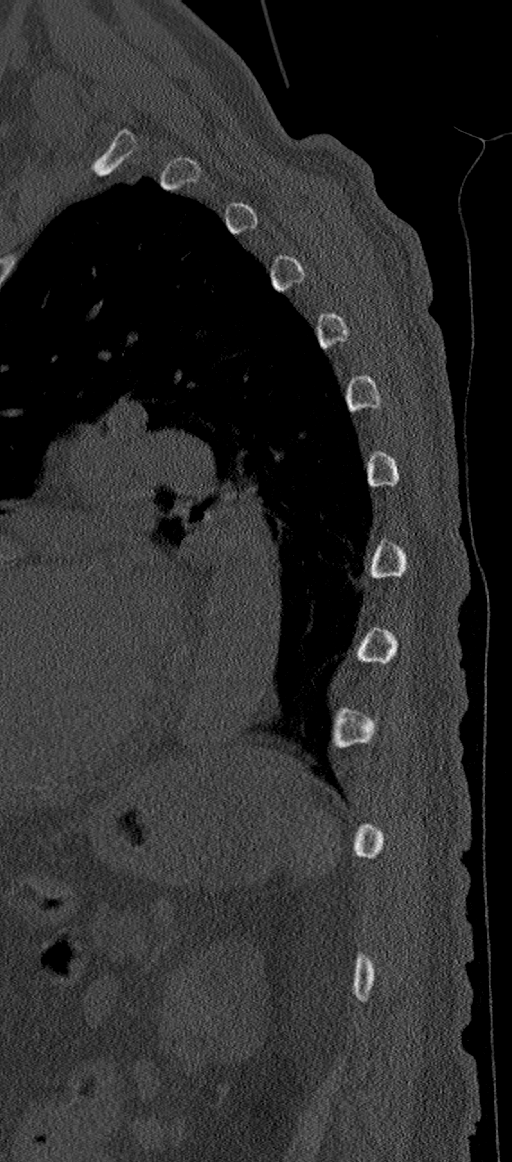

[9 of 33 positions shown; findings below may reference images not displayed]

FINDINGS: Alignment: Mild thoracic levoscoliosis.  No significant listhesis.

Vertebrae: Mild anterior wedge compression fractures of the T4 and
T5 vertebral bodies, unchanged from the prior head and neck CTA. T8
compression fracture with severe anterior vertebral body height
loss, visible on the [DATE] chest radiograph and likely chronic.
Mild L1 superior endplate compression fracture, unchanged from the
[4A] radiographs. No definite acute fracture identified. No
suspicious osseous lesion.

Paraspinal and other soft tissues: Aortic and coronary
atherosclerosis. Old left rib fractures.

Disc levels: Mild thoracic spondylosis and moderate facet arthrosis.
No evidence of high-grade spinal canal stenosis.
IMPRESSION: 1. No evidence of acute osseous abnormality in the thoracic spine.
2. Chronic T4, T5, T8, and L1 compression fractures.
3. Aortic Atherosclerosis ([4A]-[4A]).

## 2021-03-02 IMAGING — CT CT MAXILLOFACIAL W/O CM
3 series · 14 of 47 positions shown, 16 images · non-contrast
Comparison: CT head and cervical spine [DATE]. CTA head and
neck [DATE].

CLINICAL DATA: Fall.  Head and neck pain.

EXAM:
CT HEAD WITHOUT CONTRAST
CT MAXILLOFACIAL WITHOUT CONTRAST
CT CERVICAL SPINE WITHOUT CONTRAST
TECHNIQUE: Multidetector CT imaging of the head, cervical spine, and
maxillofacial structures were performed using the standard protocol
without intravenous contrast. Multiplanar CT image reconstructions
of the cervical spine and maxillofacial structures were also
generated.

[Series 1: max soft · axial · 0.39mm/px · z∈[-219,-59]mm · 8 of 94 slices shown, 10 images]
[im 7/94  brain]
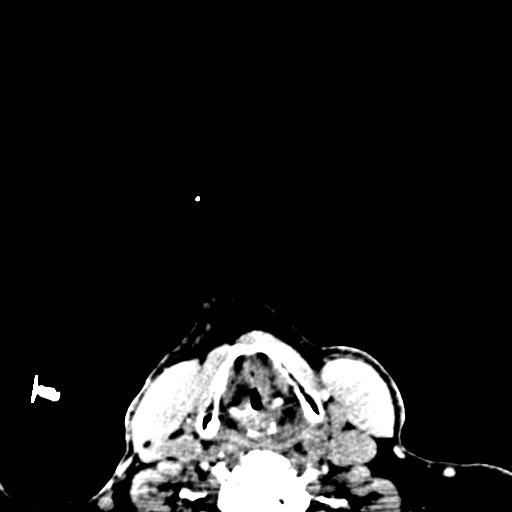
[im 7/94  bone]
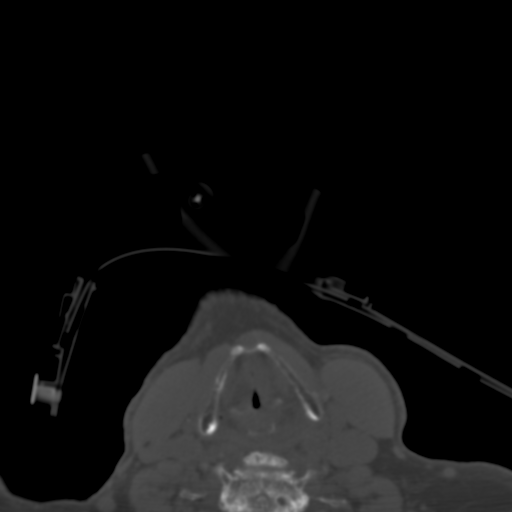
[im 20/94  bone]
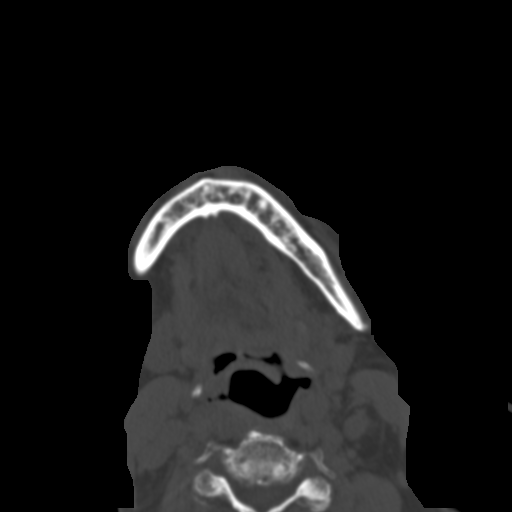
[im 29/94  bone]
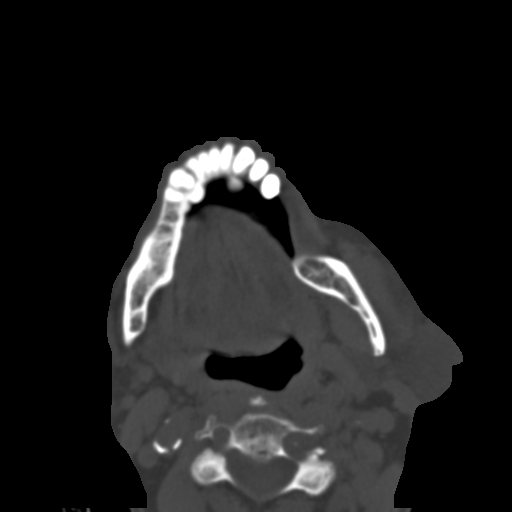
[im 42/94  bone]
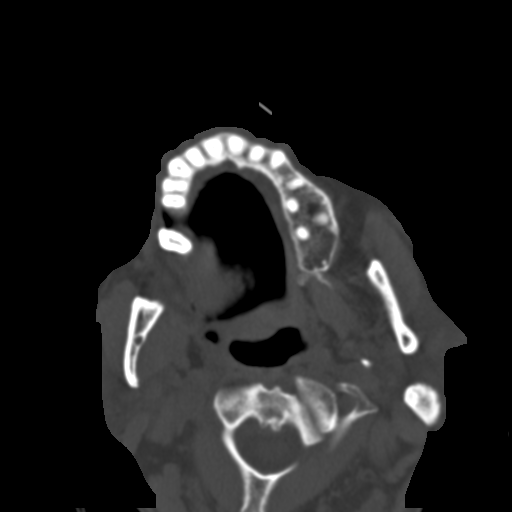
[im 52/94  brain]
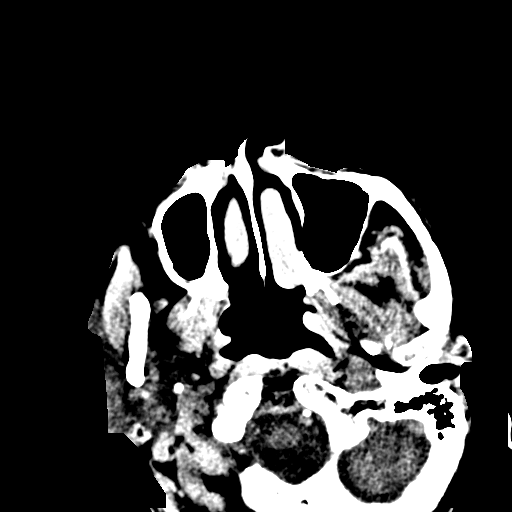
[im 52/94  bone]
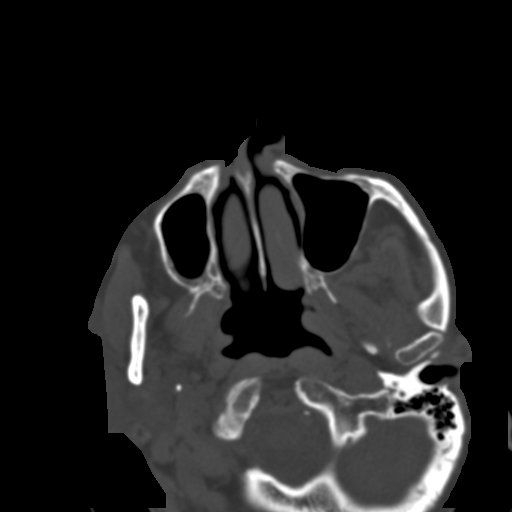
[im 65/94  bone]
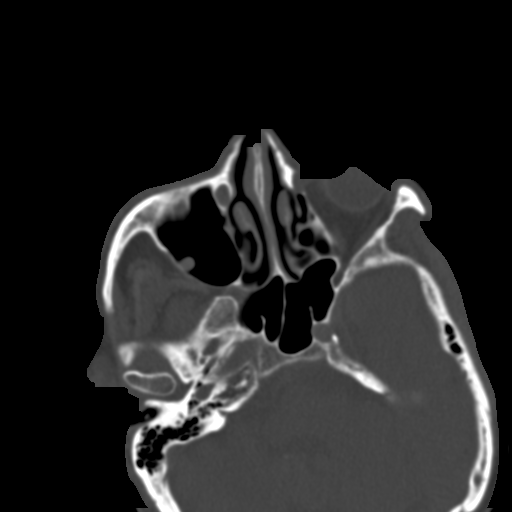
[im 74/94  bone]
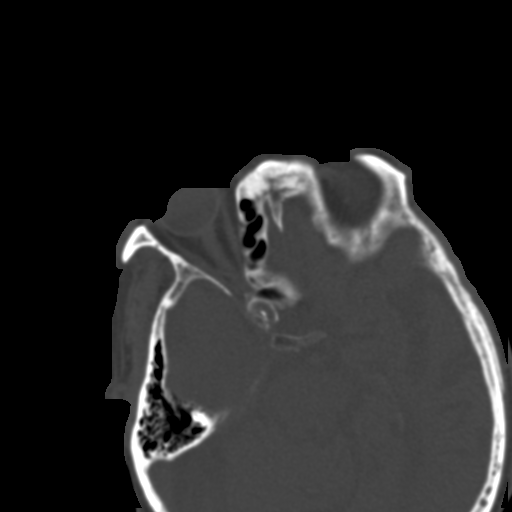
[im 87/94  bone]
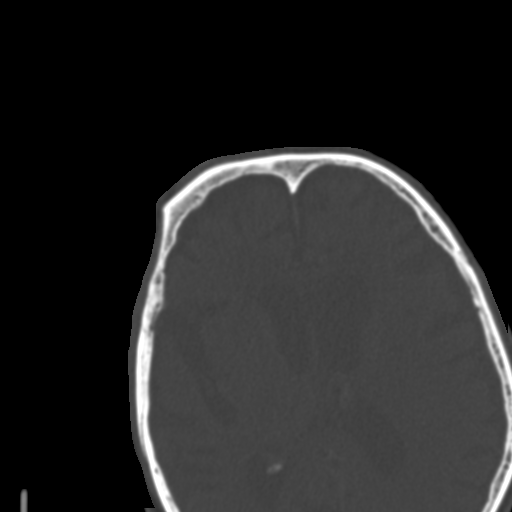

[Series 7: coronal soft · coronal · 0.40mm/px · 3 of 93 slices shown]
[im 31/93  bone]
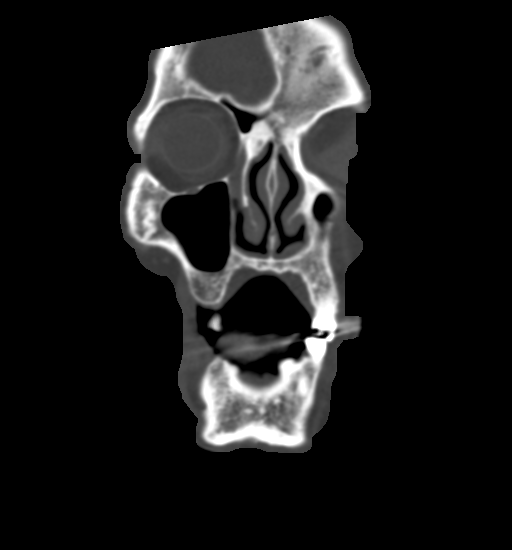
[im 41/93  bone]
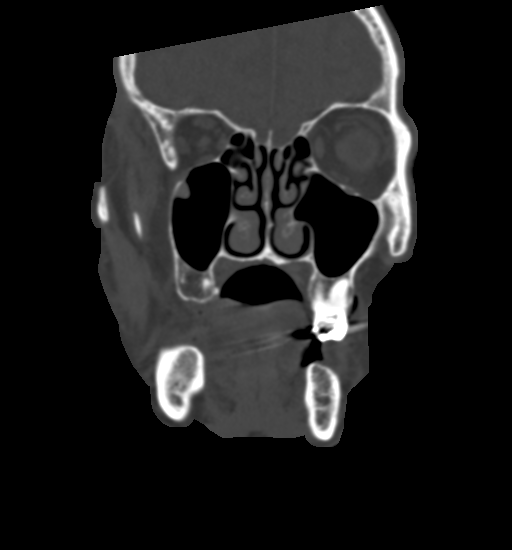
[im 52/93  bone]
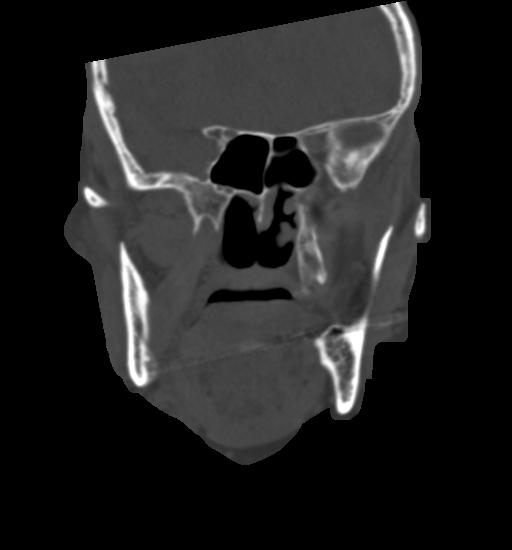

[Series 8: sagittal soft · sagittal · 0.36mm/px · 3 of 95 slices shown]
[im 39/95  bone]
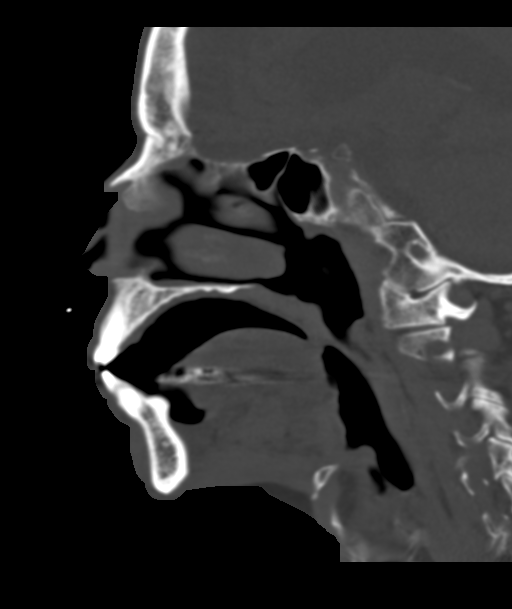
[im 48/95  bone]
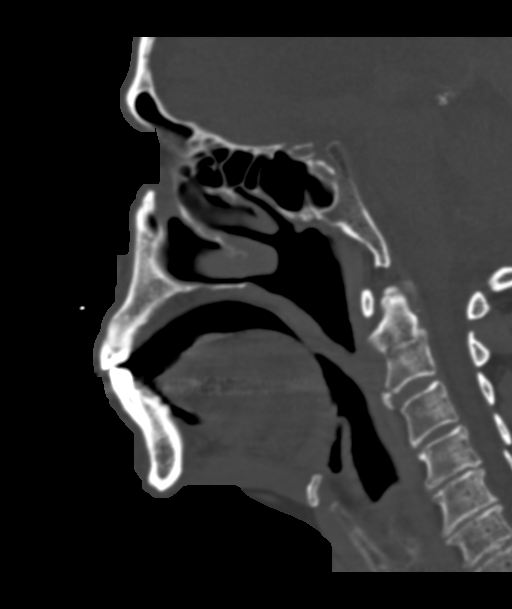
[im 56/95  bone]
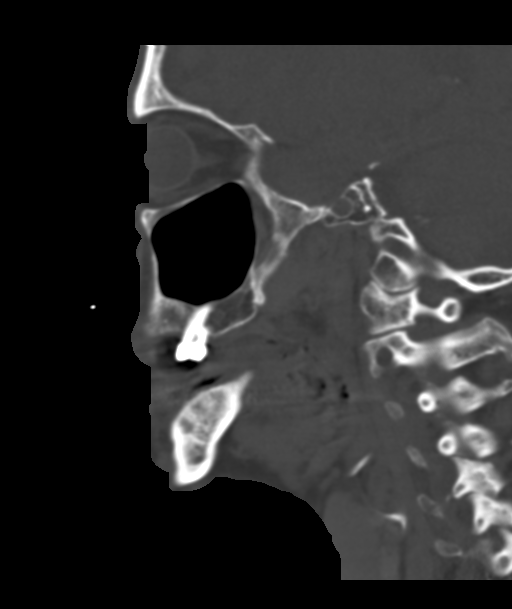

[14 of 47 positions shown; findings below may reference images not displayed]

FINDINGS: CT HEAD FINDINGS

Brain: There is no evidence of an acute infarct, intracranial
hemorrhage, mass, midline shift, or extra-axial fluid collection. A
large chronic right ACA infarct is again noted. Hypodensities
elsewhere in the cerebral white matter bilaterally are unchanged and
nonspecific but compatible with moderate chronic small vessel
ischemic disease. There is moderate cerebral atrophy.

Vascular: Calcified atherosclerosis at the skull base. No hyperdense
vessel.

Skull: No fracture or suspicious osseous lesion.

Other: Small right frontal scalp hematoma.

CT MAXILLOFACIAL FINDINGS

Osseous: No acute fracture, mandibular dislocation, or suspicious
osseous lesion.

Orbits: Bilateral cataract extraction.  No acute traumatic finding.

Sinuses: Tiny mucous retention cyst in the right maxillary sinus.
Trace right mastoid effusion.

Soft tissues: Carotid atherosclerosis. Two subcentimeter nodules in
the left parotid gland, unchanged from the prior CTA and potentially
reflecting benign intraparotid lymph nodes.

CT CERVICAL SPINE FINDINGS

Alignment: Straightening of the normal cervical lordosis. No
traumatic subluxation.

Skull base and vertebrae: New nondisplaced fracture involving the
anteroinferior corner of the C2 vertebral body through the base of
an osteophyte. No other acute fracture identified. No suspicious
osseous lesion.

Soft tissues and spinal canal: No prevertebral fluid or swelling. No
visible canal hematoma.

Disc levels: Moderate mid to lower cervical disc degeneration.
Mild-to-moderate spinal and neural foraminal stenosis at C4-5 and
C5-6.

Upper chest: Minimal left apical lung scarring.

Other: 1.2 cm right thyroid nodule for which no follow-up imaging
follow-up is recommended. Calcific atherosclerosis at the carotid
bifurcations.
IMPRESSION: 1. Acute nondisplaced fracture of the anteroinferior corner of the
C2 vertebral body through the base of an osteophyte.
2. No evidence of acute intracranial abnormality.
3. Small right frontal scalp hematoma.
4. No acute maxillofacial fracture.

## 2021-03-02 MED ORDER — LIDOCAINE-EPINEPHRINE (PF) 2 %-1:200000 IJ SOLN
10.0000 mL | Freq: Once | INTRAMUSCULAR | Status: AC
  Administered 2021-03-02: 10 mL
  Filled 2021-03-02: qty 20

## 2021-03-02 NOTE — ED Notes (Signed)
Pt ate whole ham sandwich.

## 2021-03-02 NOTE — Discharge Instructions (Addendum)
Sutures may be removed in 5 to 7 days There is a break of the second and vertebral body, but it is very small and does not need to have any further treatment Please return if having any worsening symptoms or problems.

## 2021-03-02 NOTE — ED Triage Notes (Signed)
Pt came from [facility] care center via EMS. Pt rolled off of bed while being changed. From the pt's bed to the floor is about a 1 ft drop per EMS. There is a 1 in head laceration above pt's right eyebrow. Pt is c/o head and neck pain. C-collar in place at this time.  Pt has left sided weakness from a previous stroke. Pt has hx of dementia and is only oriented to self. Pt has a DNR form with him.

## 2021-03-02 NOTE — ED Notes (Signed)
Pt's wife updated on pt's status

## 2021-03-02 NOTE — ED Notes (Signed)
Report attempted to [facility] x2.

## 2021-03-02 NOTE — ED Notes (Signed)
Report attempted to [facility] x3.

## 2021-03-02 NOTE — ED Notes (Signed)
Report attempted to [facility] health care center x1.

## 2021-03-02 NOTE — ED Provider Notes (Signed)
[facility]GENCY DEPT Provider Note   CSN: [id number] Arrival date & time: 03/02/21  1111     History Chief Complaint  Patient presents with   Fall   Head Laceration    Jeffery Haynes is a 87 y.o. male.  HPI 87 yo male ho dementia s/p stroke with residual left-sided weakness presents today from facility.  Reports that when they rolled the patient he rolled out of the bed and landed about a foot onto the floor.  Has a laceration to the right side of his head.  He has baseline left-sided weakness.  He is not complaining of any other pain or injury.  He has a cervical collar in place     Past Medical History:  Diagnosis Date   Alzheimer disease (HCC)    Dementia (HCC)    Diabetes mellitus without complication (HCC)    Stroke [facility])     Patient Active Problem List   Diagnosis Date Noted   Seizure (HCC) 12/13/2020   Alzheimer's dementia (HCC) 06/07/2020   Fracture of femoral neck, left, closed (HCC) 06/07/2020   Closed fracture of left distal femur (HCC) 06/07/2020   Essential hypertension 06/07/2020   Diabetes mellitus type 2 in nonobese (HCC) 06/07/2020   Fracture of femoral neck, left (HCC) 06/07/2020   Acute metabolic encephalopathy 01/29/2020   Atrial fibrillation with RVR (HCC) 01/29/2020   Alzheimer disease (HCC)    Dementia (HCC)     History reviewed. No pertinent surgical history.     Family History  Problem Relation Age of Onset   Seizures Neg Hx     Social History   Tobacco Use   Smoking status: Never   Smokeless tobacco: Never  Vaping Use   Vaping Use: Never used  Substance Use Topics   Alcohol use: Not Currently   Drug use: Never    Home Medications Prior to Admission medications   Medication Sig Start Date End Date Taking? Authorizing Provider  acetaminophen (TYLENOL) 325 MG tablet Take 650 mg by mouth 4 (four) times daily.    [provider]  amLODipine (NORVASC) 5 MG tablet Take 5 mg by mouth in the  morning.    [provider]  citalopram (CELEXA) 10 MG tablet Take 15 mg by mouth in the morning.    [provider]  divalproex (DEPAKOTE SPRINKLE) 125 MG capsule Take 4 capsules (500 mg total) by mouth every 12 (twelve) hours. 12/27/20   Meredeth Ide, MD  donepezil (ARICEPT) 5 MG tablet Take 1 tablet (5 mg total) by mouth at bedtime. 02/01/20 01/31/21  Pokhrel, Rebekah Chesterfield, MD  insulin aspart (NOVOLOG) 100 UNIT/ML injection Inject 0-9 Units into the skin 3 (three) times daily with meals. Sliding scale insulin Less than 70 initiate hypoglycemia protocol 70-120  0 units 120-150 1 unit 151-200 2 units 201-250 3 units 251-300 5 units 301-350 7 units 351-400 9 units  Greater than 400 call MD 12/27/20   Meredeth Ide, MD  lidocaine (LIDODERM) 5 % Place 1 patch onto the skin daily. Remove & Discard patch within 12 hours or as directed by MD Patient taking differently: Place 1 patch onto the skin daily. 06/10/20   Alwyn Ren, MD  metFORMIN (GLUCOPHAGE) 500 MG tablet Take 500 mg by mouth 2 (two) times daily.    [provider]  OLANZapine (ZYPREXA) 2.5 MG tablet Take 2.5 mg by mouth See admin instructions. At bedtime on Monday,Tuesday,Wednesday,Thursday and saturday 05/25/20   [provider]  polyethylene  glycol powder (GLYCOLAX/MIRALAX) 17 GM/SCOOP powder Take 17 g by mouth daily.    [provider]  sennosides-docusate sodium (SENOKOT-S) 8.6-50 MG tablet Take 2 tablets by mouth 2 (two) times daily.    [provider]  tamsulosin (FLOMAX) 0.4 MG CAPS capsule Take 0.4 mg by mouth at bedtime.    [provider]  traZODone (DESYREL) 50 MG tablet Take 1 tablet (50 mg total) by mouth at bedtime as needed for sleep. 12/27/20   Oswald Hillock, MD    Allergies    Olanzapine  Review of Systems   Review of Systems  All other systems reviewed and are negative.  Physical Exam Updated Vital Signs BP (!) 160/83    Pulse 91    Temp 98.7  F (37.1 C) (Oral)    Resp 18    SpO2 99%   Physical Exam Vitals and nursing note reviewed.  Constitutional:      Appearance: Normal appearance.  HENT:     Head: Normocephalic.     Comments: 2 cm laceration to head Mild tenderness palpation over right cheek    Nose: Nose normal.     Mouth/Throat:     Mouth: Mucous membranes are moist.  Eyes:     Extraocular Movements: Extraocular movements intact.     Pupils: Pupils are equal, round, and reactive to light.  Neck:     Comments: Cervical collar in place with mild diffuse tenderness palpation Cardiovascular:     Rate and Rhythm: Normal rate and regular rhythm.     Pulses: Normal pulses.  Pulmonary:     Effort: Pulmonary effort is normal.  Abdominal:     Palpations: Abdomen is soft.  Musculoskeletal:        General: No swelling, tenderness, deformity or signs of injury.     Comments: Ranged all 4 extremities without obvious external signs of trauma and no point tenderness noted No tenderness noted over mid thoracic to lumbar spine  Skin:    General: Skin is warm and dry.     Capillary Refill: Capillary refill takes less than 2 seconds.  Neurological:     General: No focal deficit present.     Mental Status: He is alert.  Psychiatric:        Mood and Affect: Mood normal.    ED Results / Procedures / Treatments   Labs (all labs ordered are listed, but only abnormal results are displayed) Labs Reviewed  CBC - Abnormal; Notable for the following components:      Result Value   MCHC 29.0 (*)    All other components within normal limits  BASIC METABOLIC PANEL - Abnormal; Notable for the following components:   Glucose, Bld 187 (*)    BUN 30 (*)    Calcium 8.7 (*)    All other components within normal limits    EKG None  Radiology CT Head Wo Contrast  Result Date: 03/02/2021 CLINICAL DATA:  Fall.  Head and neck pain. EXAM: CT HEAD WITHOUT CONTRAST CT MAXILLOFACIAL WITHOUT CONTRAST CT CERVICAL SPINE WITHOUT CONTRAST  TECHNIQUE: Multidetector CT imaging of the head, cervical spine, and maxillofacial structures were performed using the standard protocol without intravenous contrast. Multiplanar CT image reconstructions of the cervical spine and maxillofacial structures were also generated. COMPARISON:  CT head and cervical spine 02/01/2021. CTA head and neck 12/12/2020. FINDINGS: CT HEAD FINDINGS Brain: There is no evidence of an acute infarct, intracranial hemorrhage, mass, midline shift, or extra-axial fluid collection. A large chronic  right ACA infarct is again noted. Hypodensities elsewhere in the cerebral white matter bilaterally are unchanged and nonspecific but compatible with moderate chronic small vessel ischemic disease. There is moderate cerebral atrophy. Vascular: Calcified atherosclerosis at the skull base. No hyperdense vessel. Skull: No fracture or suspicious osseous lesion. Other: Small right frontal scalp hematoma. CT MAXILLOFACIAL FINDINGS Osseous: No acute fracture, mandibular dislocation, or suspicious osseous lesion. Orbits: Bilateral cataract extraction.  No acute traumatic finding. Sinuses: Tiny mucous retention cyst in the right maxillary sinus. Trace right mastoid effusion. Soft tissues: Carotid atherosclerosis. Two subcentimeter nodules in the left parotid gland, unchanged from the prior CTA and potentially reflecting benign intraparotid lymph nodes. CT CERVICAL SPINE FINDINGS Alignment: Straightening of the normal cervical lordosis. No traumatic subluxation. Skull base and vertebrae: New nondisplaced fracture involving the anteroinferior corner of the C2 vertebral body through the base of an osteophyte. No other acute fracture identified. No suspicious osseous lesion. Soft tissues and spinal canal: No prevertebral fluid or swelling. No visible canal hematoma. Disc levels: Moderate mid to lower cervical disc degeneration. Mild-to-moderate spinal and neural foraminal stenosis at C4-5 and C5-6. Upper chest:  Minimal left apical lung scarring. Other: 1.2 cm right thyroid nodule for which no follow-up imaging follow-up is recommended. Calcific atherosclerosis at the carotid bifurcations. IMPRESSION: 1. Acute nondisplaced fracture of the anteroinferior corner of the C2 vertebral body through the base of an osteophyte. 2. No evidence of acute intracranial abnormality. 3. Small right frontal scalp hematoma. 4. No acute maxillofacial fracture. Electronically Signed   By: Logan Bores M.D.   On: 03/02/2021 13:19   CT Cervical Spine Wo Contrast  Result Date: 03/02/2021 CLINICAL DATA:  Fall.  Head and neck pain. EXAM: CT HEAD WITHOUT CONTRAST CT MAXILLOFACIAL WITHOUT CONTRAST CT CERVICAL SPINE WITHOUT CONTRAST TECHNIQUE: Multidetector CT imaging of the head, cervical spine, and maxillofacial structures were performed using the standard protocol without intravenous contrast. Multiplanar CT image reconstructions of the cervical spine and maxillofacial structures were also generated. COMPARISON:  CT head and cervical spine 02/01/2021. CTA head and neck 12/12/2020. FINDINGS: CT HEAD FINDINGS Brain: There is no evidence of an acute infarct, intracranial hemorrhage, mass, midline shift, or extra-axial fluid collection. A large chronic right ACA infarct is again noted. Hypodensities elsewhere in the cerebral white matter bilaterally are unchanged and nonspecific but compatible with moderate chronic small vessel ischemic disease. There is moderate cerebral atrophy. Vascular: Calcified atherosclerosis at the skull base. No hyperdense vessel. Skull: No fracture or suspicious osseous lesion. Other: Small right frontal scalp hematoma. CT MAXILLOFACIAL FINDINGS Osseous: No acute fracture, mandibular dislocation, or suspicious osseous lesion. Orbits: Bilateral cataract extraction.  No acute traumatic finding. Sinuses: Tiny mucous retention cyst in the right maxillary sinus. Trace right mastoid effusion. Soft tissues: Carotid  atherosclerosis. Two subcentimeter nodules in the left parotid gland, unchanged from the prior CTA and potentially reflecting benign intraparotid lymph nodes. CT CERVICAL SPINE FINDINGS Alignment: Straightening of the normal cervical lordosis. No traumatic subluxation. Skull base and vertebrae: New nondisplaced fracture involving the anteroinferior corner of the C2 vertebral body through the base of an osteophyte. No other acute fracture identified. No suspicious osseous lesion. Soft tissues and spinal canal: No prevertebral fluid or swelling. No visible canal hematoma. Disc levels: Moderate mid to lower cervical disc degeneration. Mild-to-moderate spinal and neural foraminal stenosis at C4-5 and C5-6. Upper chest: Minimal left apical lung scarring. Other: 1.2 cm right thyroid nodule for which no follow-up imaging follow-up is recommended. Calcific atherosclerosis at the carotid  bifurcations. IMPRESSION: 1. Acute nondisplaced fracture of the anteroinferior corner of the C2 vertebral body through the base of an osteophyte. 2. No evidence of acute intracranial abnormality. 3. Small right frontal scalp hematoma. 4. No acute maxillofacial fracture. Electronically Signed   By: Logan Bores M.D.   On: 03/02/2021 13:19   CT Thoracic Spine Wo Contrast  Result Date: 03/02/2021 CLINICAL DATA:  Back trauma.  Fall. EXAM: CT THORACIC SPINE WITHOUT CONTRAST TECHNIQUE: Multidetector CT images of the thoracic were obtained using the standard protocol without intravenous contrast. COMPARISON:  Head and neck CTA 12/12/2020. Chest radiograph 12/17/2020. Lumbar spine radiographs 04/03/2016. FINDINGS: Alignment: Mild thoracic levoscoliosis.  No significant listhesis. Vertebrae: Mild anterior wedge compression fractures of the T4 and T5 vertebral bodies, unchanged from the prior head and neck CTA. T8 compression fracture with severe anterior vertebral body height loss, visible on the 12/17/2020 chest radiograph and likely chronic.  Mild L1 superior endplate compression fracture, unchanged from the 2018 radiographs. No definite acute fracture identified. No suspicious osseous lesion. Paraspinal and other soft tissues: Aortic and coronary atherosclerosis. Old left rib fractures. Disc levels: Mild thoracic spondylosis and moderate facet arthrosis. No evidence of high-grade spinal canal stenosis. IMPRESSION: 1. No evidence of acute osseous abnormality in the thoracic spine. 2. Chronic T4, T5, T8, and L1 compression fractures. 3. Aortic Atherosclerosis (ICD10-I70.0). Electronically Signed   By: Logan Bores M.D.   On: 03/02/2021 13:29   CT Maxillofacial Wo Contrast  Result Date: 03/02/2021 CLINICAL DATA:  Fall.  Head and neck pain. EXAM: CT HEAD WITHOUT CONTRAST CT MAXILLOFACIAL WITHOUT CONTRAST CT CERVICAL SPINE WITHOUT CONTRAST TECHNIQUE: Multidetector CT imaging of the head, cervical spine, and maxillofacial structures were performed using the standard protocol without intravenous contrast. Multiplanar CT image reconstructions of the cervical spine and maxillofacial structures were also generated. COMPARISON:  CT head and cervical spine 02/01/2021. CTA head and neck 12/12/2020. FINDINGS: CT HEAD FINDINGS Brain: There is no evidence of an acute infarct, intracranial hemorrhage, mass, midline shift, or extra-axial fluid collection. A large chronic right ACA infarct is again noted. Hypodensities elsewhere in the cerebral white matter bilaterally are unchanged and nonspecific but compatible with moderate chronic small vessel ischemic disease. There is moderate cerebral atrophy. Vascular: Calcified atherosclerosis at the skull base. No hyperdense vessel. Skull: No fracture or suspicious osseous lesion. Other: Small right frontal scalp hematoma. CT MAXILLOFACIAL FINDINGS Osseous: No acute fracture, mandibular dislocation, or suspicious osseous lesion. Orbits: Bilateral cataract extraction.  No acute traumatic finding. Sinuses: Tiny mucous  retention cyst in the right maxillary sinus. Trace right mastoid effusion. Soft tissues: Carotid atherosclerosis. Two subcentimeter nodules in the left parotid gland, unchanged from the prior CTA and potentially reflecting benign intraparotid lymph nodes. CT CERVICAL SPINE FINDINGS Alignment: Straightening of the normal cervical lordosis. No traumatic subluxation. Skull base and vertebrae: New nondisplaced fracture involving the anteroinferior corner of the C2 vertebral body through the base of an osteophyte. No other acute fracture identified. No suspicious osseous lesion. Soft tissues and spinal canal: No prevertebral fluid or swelling. No visible canal hematoma. Disc levels: Moderate mid to lower cervical disc degeneration. Mild-to-moderate spinal and neural foraminal stenosis at C4-5 and C5-6. Upper chest: Minimal left apical lung scarring. Other: 1.2 cm right thyroid nodule for which no follow-up imaging follow-up is recommended. Calcific atherosclerosis at the carotid bifurcations. IMPRESSION: 1. Acute nondisplaced fracture of the anteroinferior corner of the C2 vertebral body through the base of an osteophyte. 2. No evidence of acute intracranial abnormality. 3.  Small right frontal scalp hematoma. 4. No acute maxillofacial fracture. Electronically Signed   By: Logan Bores M.D.   On: 03/02/2021 13:19    Procedures .Marland KitchenLaceration Repair  Date/Time: 03/02/2021 1:53 PM Performed by: Pattricia Boss, MD Authorized by: Pattricia Boss, MD   Consent:    Consent obtained:  Emergent situation Universal protocol:    Immediately prior to procedure, a time out was called: yes     Patient identity confirmed:  Verbally with patient and arm band Anesthesia:    Anesthesia method:  Local infiltration   Local anesthetic:  Lidocaine 1% WITH epi Laceration details:    Location:  Face   Face location:  Forehead   Length (cm):  2 Pre-procedure details:    Preparation:  Patient was prepped and draped in usual  sterile fashion and imaging obtained to evaluate for foreign bodies Exploration:    Limited defect created (wound extended): no     Imaging outcome: foreign body not noted     Wound exploration: wound explored through full range of motion     Contaminated: no   Treatment:    Area cleansed with:  Saline   Amount of cleaning:  Standard   Irrigation method:  Syringe   Visualized foreign bodies/material removed: no     Debridement:  None   Undermining:  None Skin repair:    Repair method:  Sutures   Suture size:  5-0   Suture material:  Prolene   Suture technique:  Simple interrupted   Number of sutures:  3 Approximation:    Approximation:  Close Repair type:    Repair type:  Simple Post-procedure details:    Dressing:  Open (no dressing)   Procedure completion:  Tolerated   Medications Ordered in ED Medications  lidocaine-EPINEPHrine (XYLOCAINE W/EPI) 2 %-1:200000 (PF) injection 10 mL (has no administration in time range)    ED Course  I have reviewed the triage vital signs and the nursing notes.  Pertinent labs & imaging results that were available during my care of the patient were reviewed by me and considered in my medical decision making (see chart for details).  Clinical Course as of 03/02/21 1412  Mon Mar 02, 2021  1338 CT results personally reviewed and images reviewed osteophyte fracture CT noted Neurosurgery consulted [DR]  1341 CBC reviewed and stable Chemistries reviewed and stable except for hyperglycemia 187.  This does not require any acute intervention  [DR]  1409 Discussed with Dr. Ellene Route, neurosurgery, he has reviewed CT scan.  He does not feel the patient needs to have any further treatment and collar may be removed. [DR]    Clinical Course User Index [DR] Pattricia Boss, MD   MDM Rules/Calculators/A&P                         87 year old man in dementia unit who reportedly had a fall from rolling out of bed approximately 1 foot today.  His laceration  to his head.  Has a C2 nondisplaced body fracture. Patient was evaluated here with labs and imaging.  Head CT obtained and shows no acute intracranial abnormality. Maxillofacial CT does not show any fractures Thoracic CT shows old compression fractures but no new fractures. The patient appears to be at his baseline Patient is stable for discharge back to his facility.   Final Clinical Impression(s) / ED Diagnoses Final diagnoses:  Laceration of other part of head without foreign body, initial encounter  Other closed nondisplaced  fracture of second cervical vertebra, initial encounter [facility])    Rx / DC Orders ED Discharge Orders     None        Margarita Grizzle, MD 03/02/21 [phone]

## 2021-03-02 NOTE — ED Notes (Signed)
[facility] called for transport.  

## 2021-03-03 NOTE — ED Notes (Addendum)
Report called and given to Alvan Dame at [facility].

## 2021-04-22 ENCOUNTER — Other Ambulatory Visit (HOSPITAL_COMMUNITY): Payer: Medicare Other

## 2021-04-22 ENCOUNTER — Inpatient Hospital Stay (HOSPITAL_COMMUNITY): Payer: Medicare Other

## 2021-04-22 ENCOUNTER — Emergency Department (HOSPITAL_COMMUNITY): Payer: Medicare Other

## 2021-04-22 ENCOUNTER — Inpatient Hospital Stay (HOSPITAL_COMMUNITY)
Admission: EM | Admit: 2021-04-22 | Discharge: 2021-04-28 | DRG: 071 | Disposition: A | Discharge status: Skilled Nursing Facility | Payer: Medicare Other | Source: Skilled Nursing Facility | Attending: Internal Medicine | Admitting: Internal Medicine

## 2021-04-22 ENCOUNTER — Encounter (HOSPITAL_COMMUNITY): Payer: Self-pay | Admitting: Emergency Medicine

## 2021-04-22 ENCOUNTER — Other Ambulatory Visit: Payer: Self-pay

## 2021-04-22 DIAGNOSIS — R001 Bradycardia, unspecified: Secondary | ICD-10-CM | POA: Diagnosis present

## 2021-04-22 DIAGNOSIS — U099 Post covid-19 condition, unspecified: Secondary | ICD-10-CM | POA: Diagnosis present

## 2021-04-22 DIAGNOSIS — I10 Essential (primary) hypertension: Secondary | ICD-10-CM | POA: Diagnosis present

## 2021-04-22 DIAGNOSIS — D72829 Elevated white blood cell count, unspecified: Secondary | ICD-10-CM | POA: Diagnosis present

## 2021-04-22 DIAGNOSIS — F0280 Dementia in other diseases classified elsewhere without behavioral disturbance: Secondary | ICD-10-CM | POA: Diagnosis present

## 2021-04-22 DIAGNOSIS — Z7401 Bed confinement status: Secondary | ICD-10-CM | POA: Diagnosis not present

## 2021-04-22 DIAGNOSIS — Z79899 Other long term (current) drug therapy: Secondary | ICD-10-CM | POA: Diagnosis not present

## 2021-04-22 DIAGNOSIS — G9341 Metabolic encephalopathy: Principal | ICD-10-CM | POA: Diagnosis present

## 2021-04-22 DIAGNOSIS — R569 Unspecified convulsions: Secondary | ICD-10-CM | POA: Diagnosis present

## 2021-04-22 DIAGNOSIS — R400 Somnolence: Secondary | ICD-10-CM

## 2021-04-22 DIAGNOSIS — E119 Type 2 diabetes mellitus without complications: Secondary | ICD-10-CM

## 2021-04-22 DIAGNOSIS — I69354 Hemiplegia and hemiparesis following cerebral infarction affecting left non-dominant side: Secondary | ICD-10-CM | POA: Diagnosis not present

## 2021-04-22 DIAGNOSIS — R54 Age-related physical debility: Secondary | ICD-10-CM | POA: Diagnosis present

## 2021-04-22 DIAGNOSIS — R404 Transient alteration of awareness: Secondary | ICD-10-CM | POA: Diagnosis not present

## 2021-04-22 DIAGNOSIS — G309 Alzheimer's disease, unspecified: Secondary | ICD-10-CM | POA: Diagnosis present

## 2021-04-22 DIAGNOSIS — Z20822 Contact with and (suspected) exposure to covid-19: Secondary | ICD-10-CM | POA: Diagnosis present

## 2021-04-22 DIAGNOSIS — F02C3 Dementia in other diseases classified elsewhere, severe, with mood disturbance: Secondary | ICD-10-CM | POA: Diagnosis not present

## 2021-04-22 DIAGNOSIS — R6520 Severe sepsis without septic shock: Secondary | ICD-10-CM | POA: Diagnosis not present

## 2021-04-22 DIAGNOSIS — Z794 Long term (current) use of insulin: Secondary | ICD-10-CM

## 2021-04-22 DIAGNOSIS — R4182 Altered mental status, unspecified: Secondary | ICD-10-CM | POA: Diagnosis present

## 2021-04-22 DIAGNOSIS — Z66 Do not resuscitate: Secondary | ICD-10-CM | POA: Diagnosis present

## 2021-04-22 DIAGNOSIS — R509 Fever, unspecified: Secondary | ICD-10-CM | POA: Diagnosis present

## 2021-04-22 DIAGNOSIS — Z515 Encounter for palliative care: Secondary | ICD-10-CM | POA: Diagnosis not present

## 2021-04-22 DIAGNOSIS — Z7189 Other specified counseling: Secondary | ICD-10-CM | POA: Diagnosis not present

## 2021-04-22 DIAGNOSIS — A419 Sepsis, unspecified organism: Secondary | ICD-10-CM | POA: Diagnosis present

## 2021-04-22 DIAGNOSIS — E1165 Type 2 diabetes mellitus with hyperglycemia: Secondary | ICD-10-CM | POA: Diagnosis present

## 2021-04-22 DIAGNOSIS — F0390 Unspecified dementia without behavioral disturbance: Secondary | ICD-10-CM | POA: Diagnosis present

## 2021-04-22 DIAGNOSIS — Z7984 Long term (current) use of oral hypoglycemic drugs: Secondary | ICD-10-CM

## 2021-04-22 LAB — PROTIME-INR
INR: 1 (ref 0.8–1.2)
Prothrombin Time: 13 seconds (ref 11.4–15.2)

## 2021-04-22 LAB — CBC WITH DIFFERENTIAL/PLATELET
Abs Immature Granulocytes: 0.11 10*3/uL — ABNORMAL HIGH (ref 0.00–0.07)
Basophils Absolute: 0 10*3/uL (ref 0.0–0.1)
Basophils Relative: 0 %
Eosinophils Absolute: 0 10*3/uL (ref 0.0–0.5)
Eosinophils Relative: 0 %
HCT: 42.3 % (ref 39.0–52.0)
Hemoglobin: 13.7 g/dL (ref 13.0–17.0)
Immature Granulocytes: 1 %
Lymphocytes Relative: 6 %
Lymphs Abs: 0.9 10*3/uL (ref 0.7–4.0)
MCH: 29.1 pg (ref 26.0–34.0)
MCHC: 32.4 g/dL (ref 30.0–36.0)
MCV: 90 fL (ref 80.0–100.0)
Monocytes Absolute: 0.7 10*3/uL (ref 0.1–1.0)
Monocytes Relative: 5 %
Neutro Abs: 12.6 10*3/uL — ABNORMAL HIGH (ref 1.7–7.7)
Neutrophils Relative %: 88 %
Platelets: 283 10*3/uL (ref 150–400)
RBC: 4.7 MIL/uL (ref 4.22–5.81)
RDW: 14.1 % (ref 11.5–15.5)
WBC: 14.3 10*3/uL — ABNORMAL HIGH (ref 4.0–10.5)
nRBC: 0 % (ref 0.0–0.2)

## 2021-04-22 LAB — FERRITIN: Ferritin: 64 ng/mL (ref 24–336)

## 2021-04-22 LAB — COMPREHENSIVE METABOLIC PANEL
ALT: 11 U/L (ref 0–44)
AST: 16 U/L (ref 15–41)
Albumin: 3.7 g/dL (ref 3.5–5.0)
Alkaline Phosphatase: 87 U/L (ref 38–126)
Anion gap: 10 (ref 5–15)
BUN: 22 mg/dL (ref 8–23)
CO2: 25 mmol/L (ref 22–32)
Calcium: 9.2 mg/dL (ref 8.9–10.3)
Chloride: 100 mmol/L (ref 98–111)
Creatinine, Ser: 0.86 mg/dL (ref 0.61–1.24)
GFR, Estimated: >60 mL/min (ref >60)
Glucose, Bld: 187 mg/dL — ABNORMAL HIGH (ref 70–99)
Potassium: 5 mmol/L (ref 3.5–5.1)
Sodium: 135 mmol/L (ref 135–145)
Total Bilirubin: 0.7 mg/dL (ref 0.3–1.2)
Total Protein: 7.2 g/dL (ref 6.5–8.1)

## 2021-04-22 LAB — HEMOGLOBIN A1C
Hgb A1c MFr Bld: 7.4 % — ABNORMAL HIGH (ref 4.8–5.6)
Mean Plasma Glucose: 165.68 mg/dL

## 2021-04-22 LAB — LACTATE DEHYDROGENASE: LDH: 198 U/L — ABNORMAL HIGH (ref 98–192)

## 2021-04-22 LAB — URINALYSIS, ROUTINE W REFLEX MICROSCOPIC
Bilirubin Urine: NEGATIVE
Glucose, UA: 150 mg/dL — AB
Hgb urine dipstick: NEGATIVE
Ketones, ur: 5 mg/dL — AB
Leukocytes,Ua: NEGATIVE
Nitrite: NEGATIVE
Protein, ur: NEGATIVE mg/dL
Specific Gravity, Urine: 1.015 (ref 1.005–1.030)
pH: 7 (ref 5.0–8.0)

## 2021-04-22 LAB — D-DIMER, QUANTITATIVE: D-Dimer, Quant: 0.51 ug/mL-FEU — ABNORMAL HIGH (ref 0.00–0.50)

## 2021-04-22 LAB — APTT: aPTT: 30 seconds (ref 24–36)

## 2021-04-22 LAB — FIBRINOGEN: Fibrinogen: 455 mg/dL (ref 210–475)

## 2021-04-22 LAB — I-STAT VENOUS BLOOD GAS, ED
Acid-Base Excess: 3 mmol/L — ABNORMAL HIGH (ref 0.0–2.0)
Bicarbonate: 25.2 mmol/L (ref 20.0–28.0)
Calcium, Ion: 0.95 mmol/L — ABNORMAL LOW (ref 1.15–1.40)
HCT: 40 % (ref 39.0–52.0)
Hemoglobin: 13.6 g/dL (ref 13.0–17.0)
O2 Saturation: 99 %
Potassium: 4.9 mmol/L (ref 3.5–5.1)
Sodium: 130 mmol/L — ABNORMAL LOW (ref 135–145)
TCO2: 26 mmol/L (ref 22–32)
pCO2, Ven: 29.6 mmHg — ABNORMAL LOW (ref 44.0–60.0)
pH, Ven: 7.538 — ABNORMAL HIGH (ref 7.250–7.430)
pO2, Ven: 117 mmHg — ABNORMAL HIGH (ref 32.0–45.0)

## 2021-04-22 LAB — RESP PANEL BY RT-PCR (FLU A&B, COVID) ARPGX2
Influenza A by PCR: NEGATIVE
Influenza B by PCR: NEGATIVE
SARS Coronavirus 2 by RT PCR: POSITIVE — AB

## 2021-04-22 LAB — AMMONIA: Ammonia: 28 umol/L (ref 9–35)

## 2021-04-22 LAB — LACTIC ACID, PLASMA
Lactic Acid, Venous: 2 mmol/L (ref 0.5–1.9)
Lactic Acid, Venous: 1.2 mmol/L (ref 0.5–1.9)

## 2021-04-22 LAB — PROCALCITONIN: Procalcitonin: <0.1 ng/mL

## 2021-04-22 LAB — C-REACTIVE PROTEIN: CRP: 1 mg/dL — ABNORMAL HIGH (ref <1.0)

## 2021-04-22 IMAGING — DX DG CHEST 1V PORT
1 series · 1 of 1 positions shown · non-contrast
Comparison: [DATE]

CLINICAL DATA: Questionable sepsis

EXAM:
PORTABLE CHEST 1 VIEW

[chest]
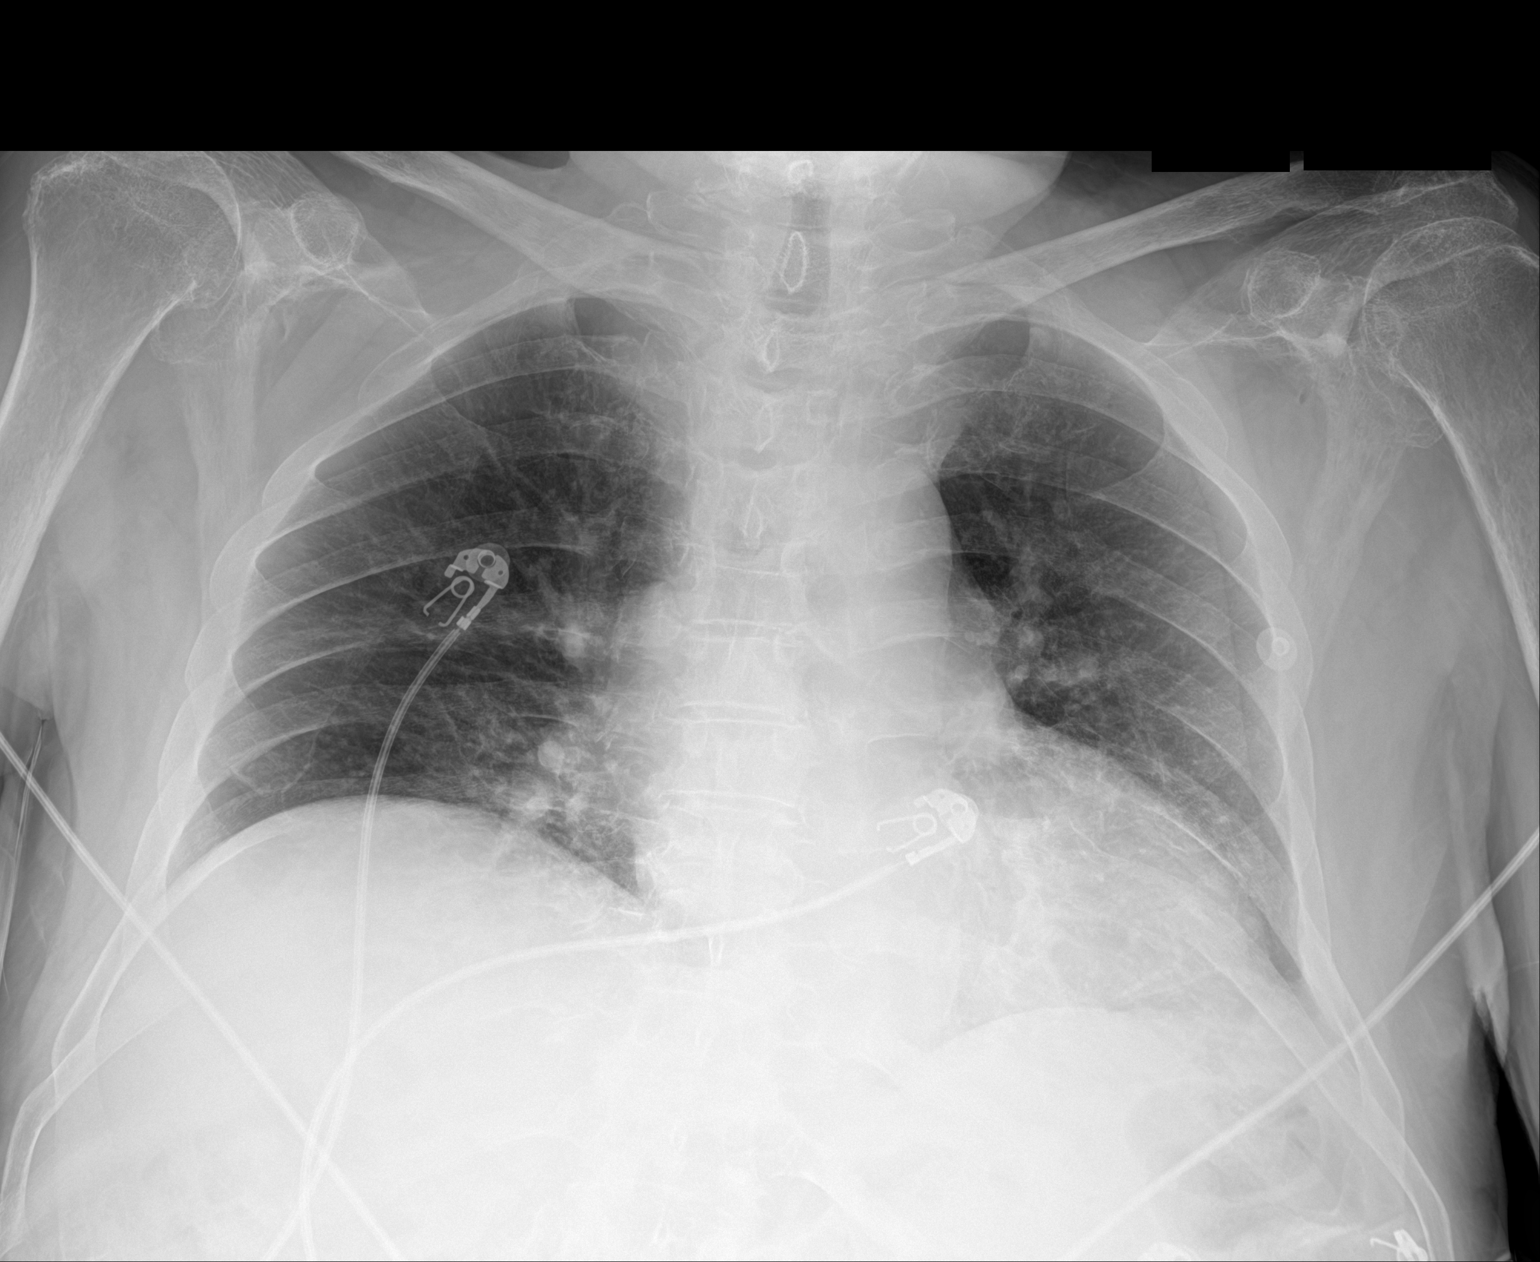

[1 of 1 positions shown; findings below may reference images not displayed]

FINDINGS: Normal mediastinum and cardiac silhouette. Normal pulmonary
vasculature. No evidence of effusion, infiltrate, or pneumothorax.
No acute bony abnormality.
IMPRESSION: No acute cardiopulmonary process.

## 2021-04-22 IMAGING — CT CT ANGIO HEAD-NECK (W OR W/O PERF)
3 of 7 series · 10 of 36 positions shown · IV contrast (OMNI 350)
Comparison: CT head [DATE].  CT angio head and neck [DATE]

CLINICAL DATA: Acute neuro deficit.  Suspect stroke.

EXAM:
CT ANGIOGRAPHY HEAD AND NECK
TECHNIQUE: Multidetector CT imaging of the head and neck was performed using
the standard protocol during bolus administration of intravenous
contrast. Multiplanar CT image reconstructions and MIPs were
obtained to evaluate the vascular anatomy. Carotid stenosis
measurements (when applicable) are obtained utilizing NASCET
criteria, using the distal internal carotid diameter as the
denominator.

[Series 6: cta neck · axial · 0.57mm/px · z∈[-217,-99]mm · 2 of 174 slices shown]
[im 58/174  soft-tissue]
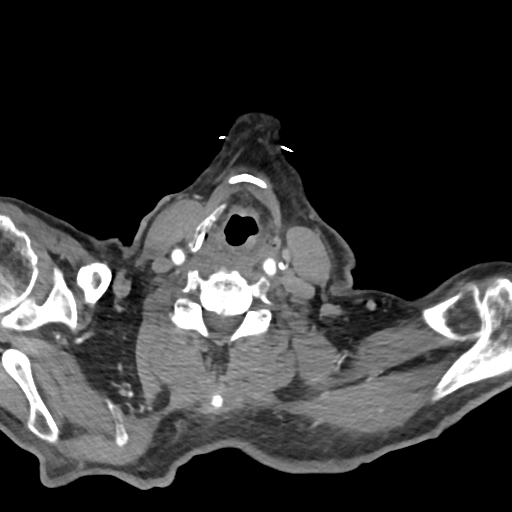
[im 116/174  soft-tissue]
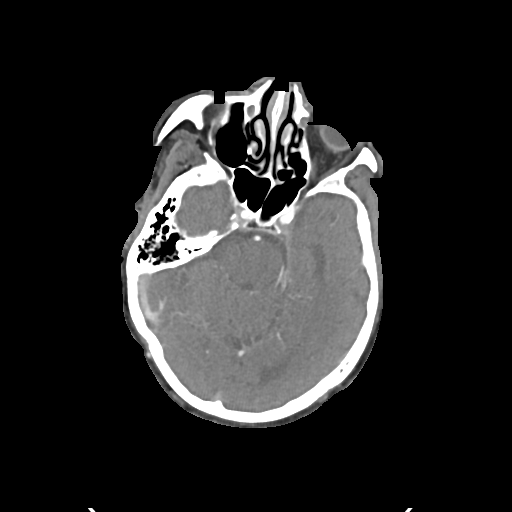

[Series 8: cta neck axial · axial · 0.53mm/px · z∈[-281,-31]mm · 6 of 352 slices shown]
[im 51/352  soft-tissue]
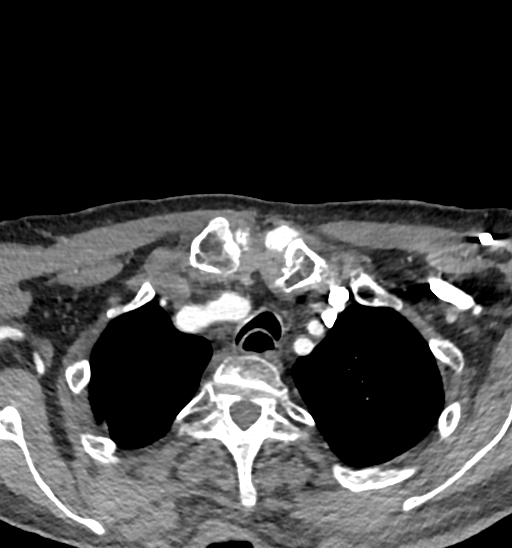
[im 101/352  bone]
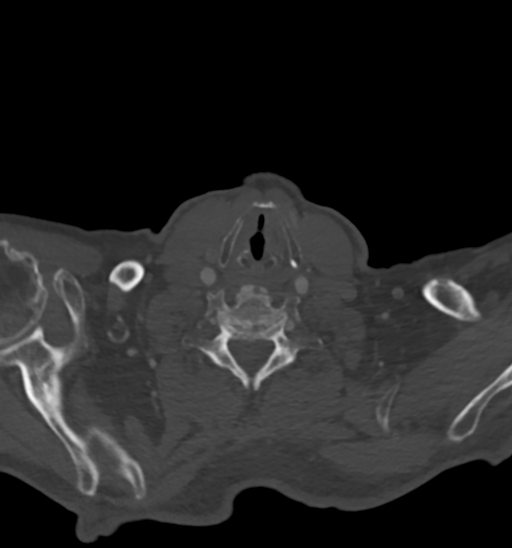
[im 151/352  soft-tissue]
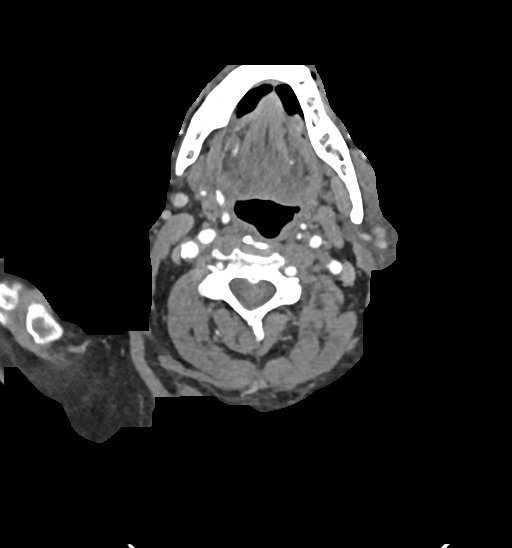
[im 201/352  bone]
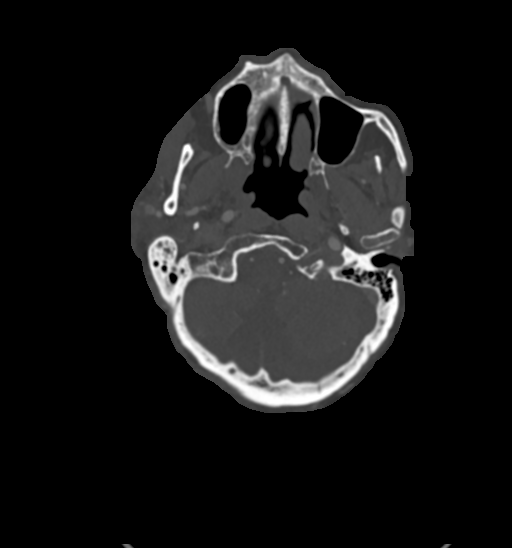
[im 251/352  soft-tissue]
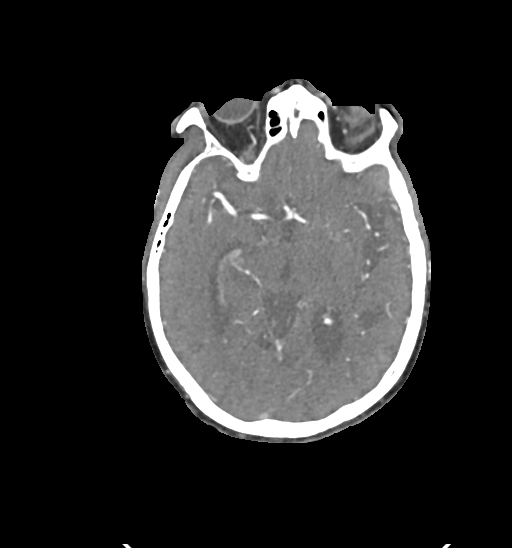
[im 301/352  bone]
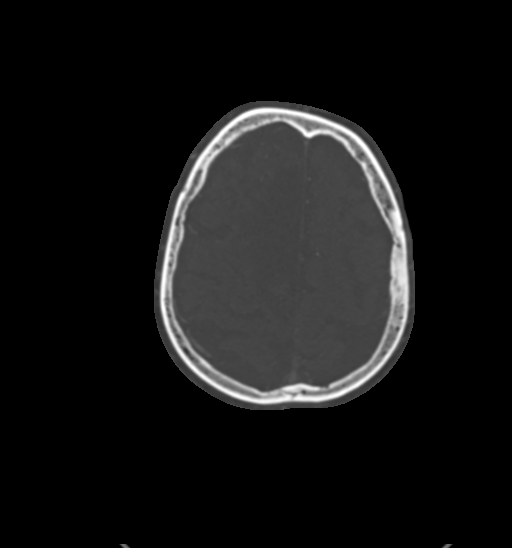

[Series 12: cta neck sagittal · sagittal · 0.57mm/px · 2 of 275 slices shown]
[im 42/275  soft-tissue]
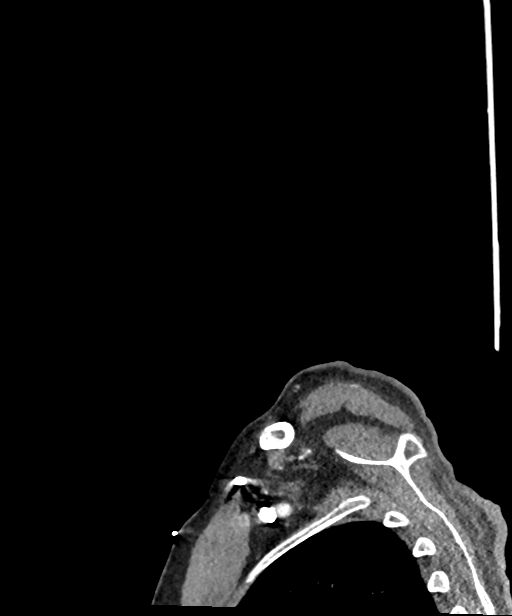
[im 234/275  soft-tissue]
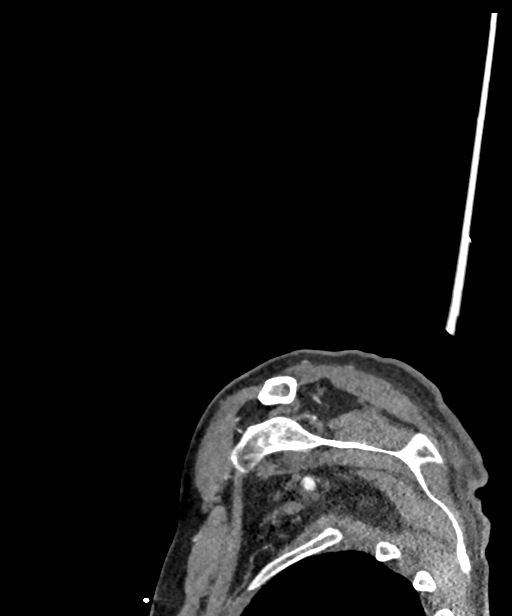

[10 of 36 positions shown; findings below may reference images not displayed]

RADIATION DOSE REDUCTION: This exam was performed according to the
departmental dose-optimization program which includes automated
exposure control, adjustment of the mA and/or kV according to
patient size and/or use of iterative reconstruction technique.

CONTRAST:  100mL OMNIPAQUE IOHEXOL 350 MG/ML SOLN
FINDINGS: CTA NECK FINDINGS

Aortic arch: Standard branching. Imaged portion shows no evidence of
aneurysm or dissection. No significant stenosis of the major arch
vessel origins. Mild atherosclerotic disease aortic arch.

Right carotid system: Mild atherosclerotic calcification right
carotid bulb. Negative for right carotid stenosis

Left carotid system: Mild atherosclerotic calcification left carotid
bulb. Negative for stenosis

Vertebral arteries: Moderate to severe stenosis at the origin of the
vertebral artery bilaterally due to calcific plaque. Both vertebral
arteries are then patent to the basilar.

Skeleton: Multilevel cervical disc degeneration. No acute skeletal
abnormality.

Other neck: Negative for mass or adenopathy.

Upper chest: Lung apices clear bilaterally.

Review of the MIP images confirms the above findings

CTA HEAD FINDINGS

Anterior circulation: Atherosclerotic calcification throughout the
cavernous carotid bilaterally causing mild to moderate stenosis
bilaterally. Chronic occlusion right A2 segment. Chronic right
anterior cerebral artery infarct. Moderate stenosis proximal right
M1 segment. Severe stenosis inferior division of right MCA. Mild
disease in the superior division of the right MCA. Left middle
cerebral artery patent with scattered mild atherosclerotic disease.

Posterior circulation: Both vertebral arteries patent to the
basilar. PICA patent on the left. Right PICA appears supplied from
the right AICA. Basilar widely patent. Superior cerebellar and
posterior cerebral arteries patent bilaterally. Mild atherosclerotic
disease in the posterior cerebral artery bilaterally.

Venous sinuses: Normal venous enhancement

Anatomic variants: None

Review of the MIP images confirms the above findings
IMPRESSION: 1. Chronic occlusion right anterior cerebral artery. Chronic right
anterior cerebral artery infarct. This is unchanged from the prior
CTA.
2. Moderate stenosis right M1 segment. Severe stenosis inferior
division right middle cerebral artery, this appeared occluded
previously and has recanalized in the interval.
3. Mild atherosclerotic disease of the carotid bifurcation
bilaterally without significant stenosis
4. Moderate to severe stenosis at the origin of the vertebral artery
bilaterally.

## 2021-04-22 IMAGING — CT CT CHEST-ABD-PELV W/ CM
2 of 5 series · 14 of 36 positions shown, 16 images · IV contrast (Omni 300)
Comparison: None.

CLINICAL DATA: Sepsis

EXAM:
CT CHEST, ABDOMEN, AND PELVIS WITH CONTRAST
TECHNIQUE: Multidetector CT imaging of the chest, abdomen and pelvis was
performed following the standard protocol during bolus
administration of intravenous contrast.

[Series 4: cap with 5mm st · axial · 0.98mm/px · z∈[-804,-284]mm · 11 of 126 slices shown, 13 images]
[im 11/126  mediastinal]
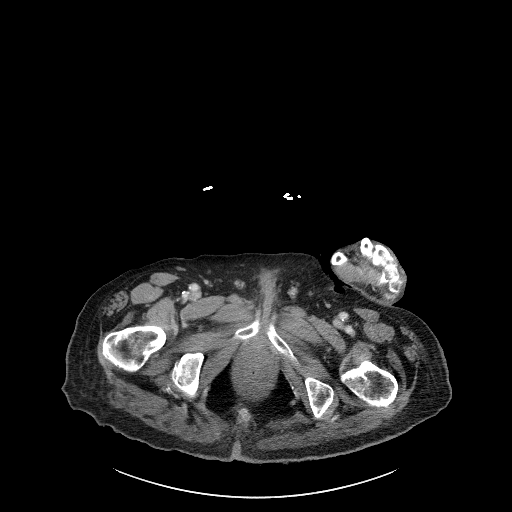
[im 11/126  bone]
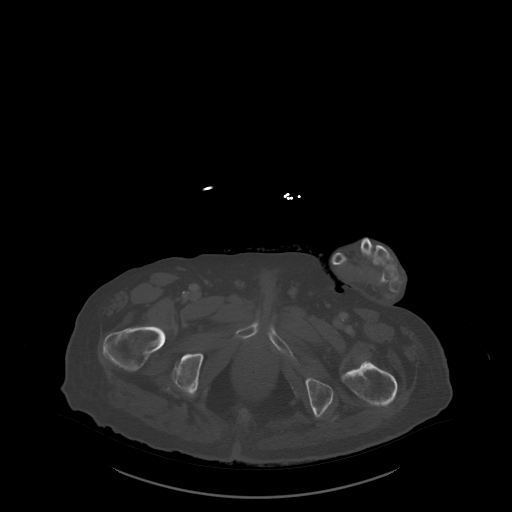
[im 21/126  mediastinal]
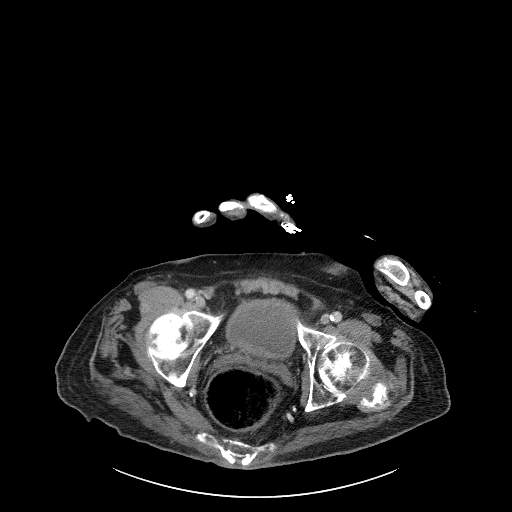
[im 32/126  mediastinal]
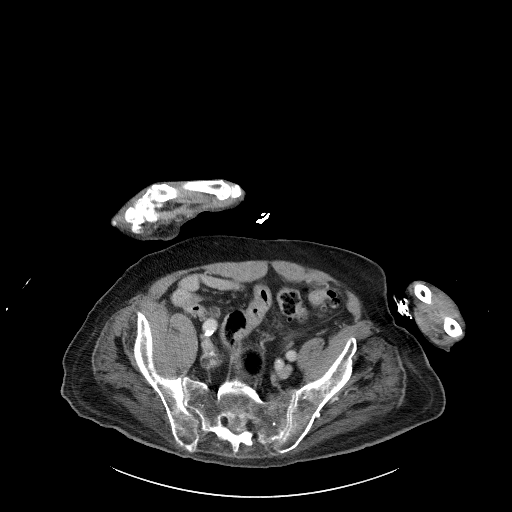
[im 42/126  mediastinal]
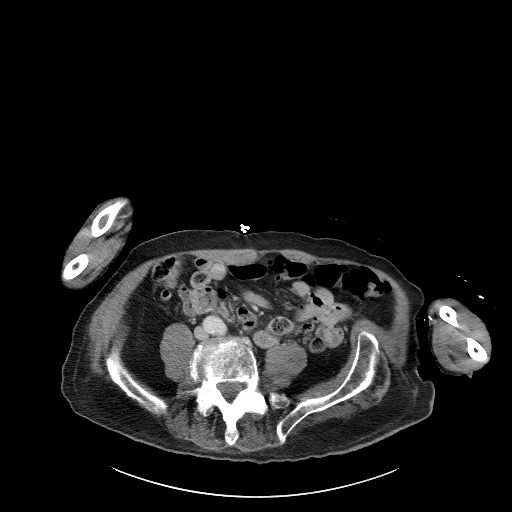
[im 53/126  mediastinal]
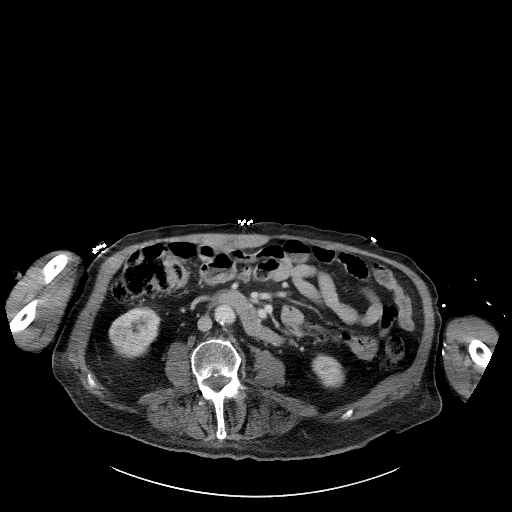
[im 63/126  mediastinal]
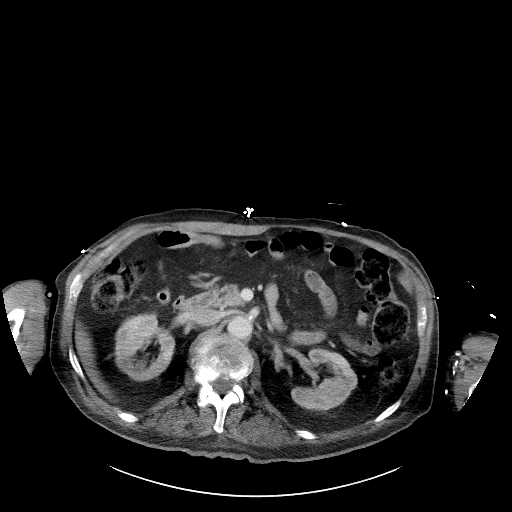
[im 73/126  mediastinal]
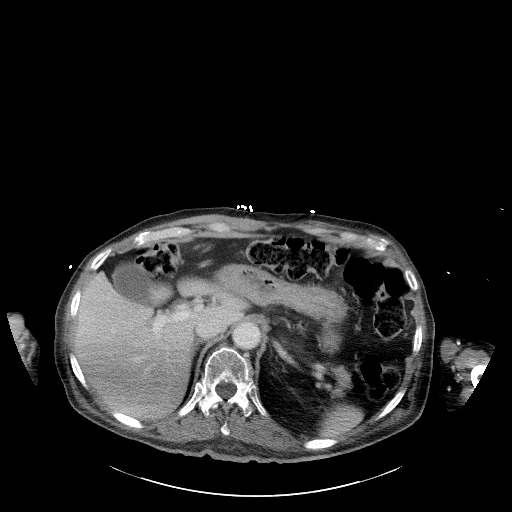
[im 84/126  mediastinal]
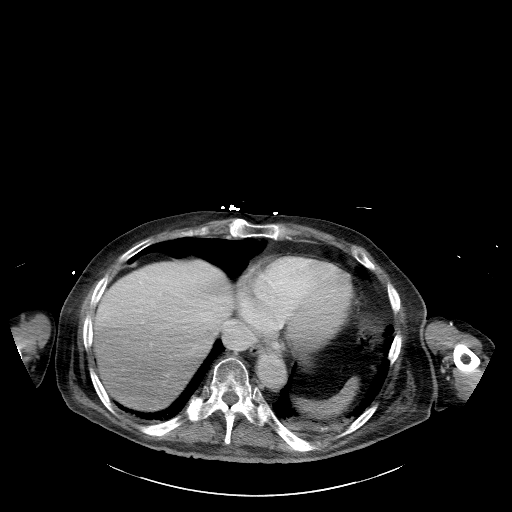
[im 94/126  mediastinal]
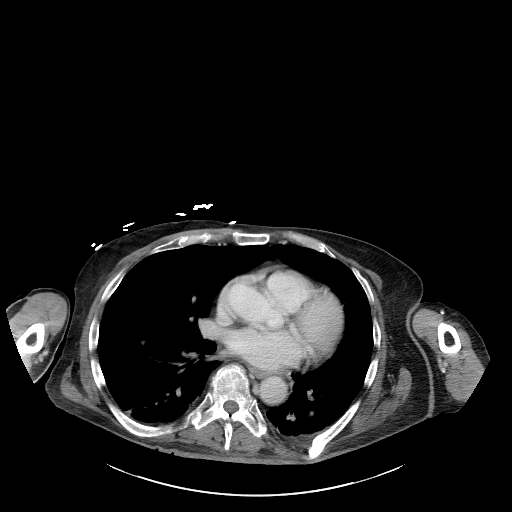
[im 94/126  bone]
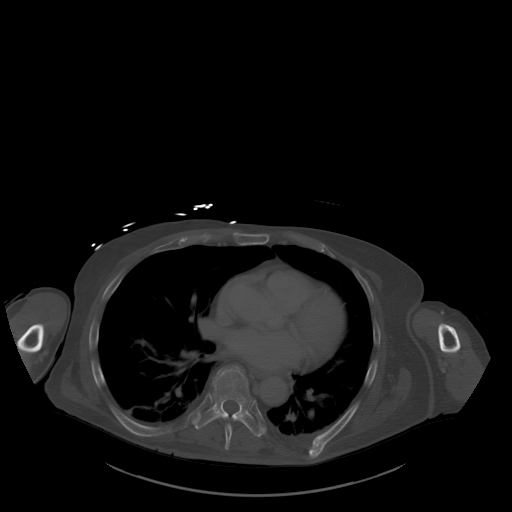
[im 105/126  mediastinal]
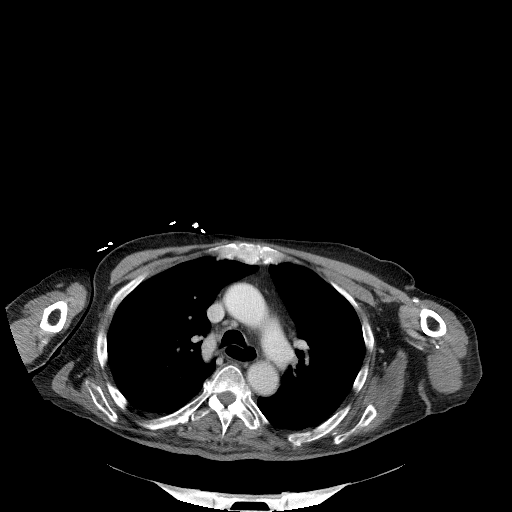
[im 115/126  mediastinal]
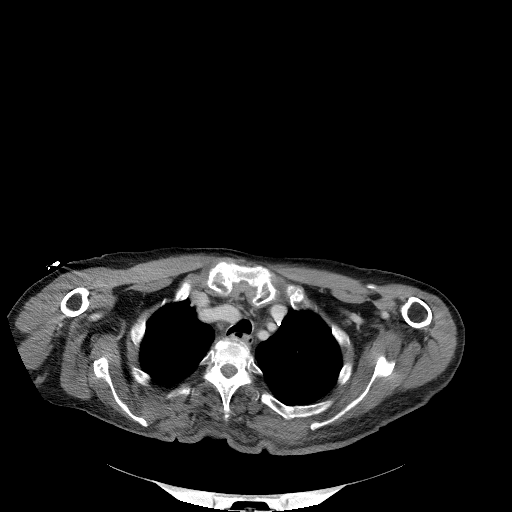

[Series 8: cap with 3mm st cor · coronal · 0.79mm/px · 3 of 130 slices shown]
[im 26/130  mediastinal]
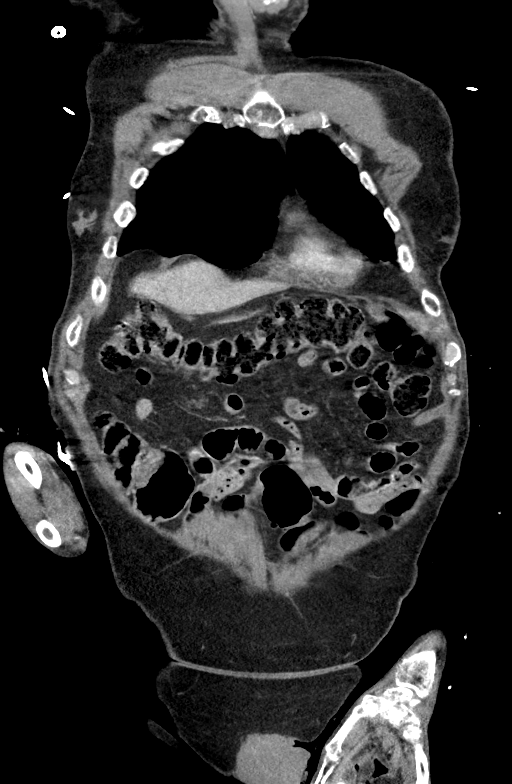
[im 52/130  mediastinal]
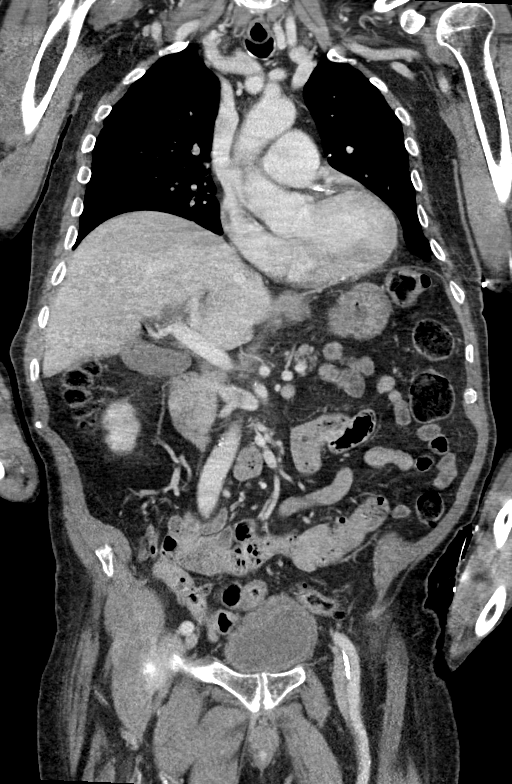
[im 78/130  mediastinal]
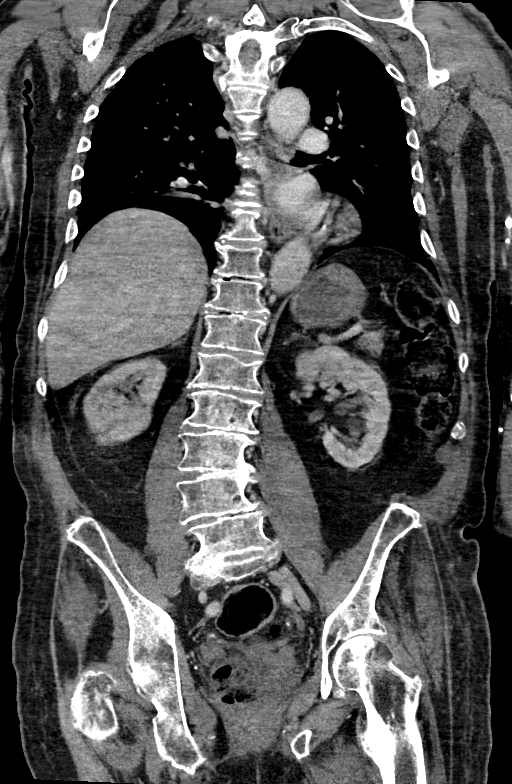

[14 of 36 positions shown; findings below may reference images not displayed]

RADIATION DOSE REDUCTION: This exam was performed according to the
departmental dose-optimization program which includes automated
exposure control, adjustment of the mA and/or kV according to
patient size and/or use of iterative reconstruction technique.

CONTRAST:  100mL OMNIPAQUE IOHEXOL 350 MG/ML SOLN
FINDINGS: CT CHEST FINDINGS

Cardiovascular: Normal heart size. No pericardial effusion. Left
main and three-vessel coronary artery calcifications.
Atherosclerotic disease of the thoracic aorta.

Mediastinum/Nodes: Patulous esophagus. Thyroid is unremarkable. No
pathologically enlarged lymph nodes seen in the chest.

Lungs/Pleura: Central airways are patent. Linear opacities of the
bilateral lower lobes which are likely due to scarring or
atelectasis. No consolidation, pleural effusion or pneumothorax.

Musculoskeletal: Old posterior left-sided rib fractures. No chest
wall mass or suspicious bone lesions identified.

CT ABDOMEN PELVIS FINDINGS

Hepatobiliary: No focal liver abnormality is seen. No gallstones,
gallbladder wall thickening, or biliary dilatation.

Pancreas: Unremarkable. No pancreatic ductal dilatation or
surrounding inflammatory changes.

Spleen: Normal in size without focal abnormality.

Adrenals/Urinary Tract: No hydronephrosis or nephrolithiasis. Renal
vascular calcifications with no definite nephrolithiasis. Bladder
wall thickening.

Stomach/Bowel: Stomach is within normal limits. Appendix appears
normal. Diverticulosis. No evidence of bowel wall thickening,
distention, or inflammatory changes.

Vascular/Lymphatic: Aortic atherosclerosis. No enlarged abdominal or
pelvic lymph nodes.

Reproductive: Mild prostatomegaly.

Other: Tiny fat containing umbilical hernia. No abdominopelvic
ascites.

Musculoskeletal: Unchanged mild compression deformities of the
thoracic and lumbar spine. No acute or significant osseous findings.
IMPRESSION: 1. No acute findings in the chest, abdomen or pelvis.
2.  Aortic Atherosclerosis ([VZ]-[VZ]).

## 2021-04-22 MED ORDER — HEPARIN SODIUM (PORCINE) 5000 UNIT/ML IJ SOLN
5000.0000 [IU] | Freq: Two times a day (BID) | INTRAMUSCULAR | Status: DC
  Administered 2021-04-23 – 2021-04-27 (×11): 5000 [IU] via SUBCUTANEOUS
  Filled 2021-04-22 (×11): qty 1

## 2021-04-22 MED ORDER — SODIUM CHLORIDE 0.9 % IV SOLN
200.0000 mg | Freq: Once | INTRAVENOUS | Status: AC
  Administered 2021-04-22: 200 mg via INTRAVENOUS
  Filled 2021-04-22: qty 40

## 2021-04-22 MED ORDER — LABETALOL HCL 5 MG/ML IV SOLN
10.0000 mg | INTRAVENOUS | Status: DC | PRN
  Administered 2021-04-22 – 2021-04-24 (×5): 10 mg via INTRAVENOUS
  Filled 2021-04-22 (×6): qty 4

## 2021-04-22 MED ORDER — VANCOMYCIN HCL 1250 MG/250ML IV SOLN
1250.0000 mg | INTRAVENOUS | Status: DC
  Administered 2021-04-23: 1250 mg via INTRAVENOUS
  Filled 2021-04-22 (×2): qty 250

## 2021-04-22 MED ORDER — ACETAMINOPHEN 650 MG RE SUPP
650.0000 mg | Freq: Once | RECTAL | Status: AC
Start: 2021-04-22 — End: 2021-04-22
  Administered 2021-04-22: 650 mg via RECTAL
  Filled 2021-04-22: qty 1

## 2021-04-22 MED ORDER — INSULIN ASPART 100 UNIT/ML IJ SOLN
0.0000 [IU] | Freq: Three times a day (TID) | INTRAMUSCULAR | Status: DC
  Administered 2021-04-23: 2 [IU] via SUBCUTANEOUS
  Administered 2021-04-23: 3 [IU] via SUBCUTANEOUS
  Administered 2021-04-24 (×3): 2 [IU] via SUBCUTANEOUS
  Administered 2021-04-25 – 2021-04-26 (×2): 3 [IU] via SUBCUTANEOUS
  Administered 2021-04-26 – 2021-04-27 (×3): 2 [IU] via SUBCUTANEOUS
  Administered 2021-04-27: 3 [IU] via SUBCUTANEOUS
  Administered 2021-04-27 – 2021-04-28 (×2): 2 [IU] via SUBCUTANEOUS

## 2021-04-22 MED ORDER — SODIUM CHLORIDE 0.9 % IV SOLN
100.0000 mg | Freq: Every day | INTRAVENOUS | Status: DC
  Administered 2021-04-23 – 2021-04-24 (×2): 100 mg via INTRAVENOUS
  Filled 2021-04-22 (×2): qty 20
  Filled 2021-04-22: qty 100

## 2021-04-22 MED ORDER — LACTATED RINGERS IV SOLN: INTRAVENOUS | Status: DC

## 2021-04-22 MED ORDER — ACETAMINOPHEN 325 MG PO TABS: 650.0000 mg | ORAL_TABLET | Freq: Four times a day (QID) | ORAL | Status: DC

## 2021-04-22 MED ORDER — POLYETHYLENE GLYCOL 3350 17 GM/SCOOP PO POWD
17.0000 g | Freq: Every day | ORAL | Status: DC
  Filled 2021-04-22: qty 255

## 2021-04-22 MED ORDER — SODIUM CHLORIDE 0.9 % IV SOLN
2.0000 g | Freq: Once | INTRAVENOUS | Status: AC
  Administered 2021-04-22: 2 g via INTRAVENOUS
  Filled 2021-04-22: qty 2

## 2021-04-22 MED ORDER — IOHEXOL 350 MG/ML SOLN
100.0000 mL | Freq: Once | INTRAVENOUS | Status: AC | PRN
  Administered 2021-04-22: 100 mL via INTRAVENOUS

## 2021-04-22 MED ORDER — TRAZODONE HCL 50 MG PO TABS
50.0000 mg | ORAL_TABLET | Freq: Every evening | ORAL | Status: DC | PRN
  Administered 2021-04-26: 50 mg via ORAL
  Filled 2021-04-22: qty 1

## 2021-04-22 MED ORDER — CITALOPRAM HYDROBROMIDE 10 MG PO TABS
15.0000 mg | ORAL_TABLET | Freq: Every morning | ORAL | Status: DC
  Administered 2021-04-26 – 2021-04-28 (×3): 15 mg via ORAL
  Filled 2021-04-22 (×4): qty 2

## 2021-04-22 MED ORDER — ACETAMINOPHEN 650 MG RE SUPP
650.0000 mg | Freq: Four times a day (QID) | RECTAL | Status: DC | PRN
  Administered 2021-04-23 – 2021-04-28 (×2): 650 mg via RECTAL
  Filled 2021-04-22 (×2): qty 1

## 2021-04-22 MED ORDER — VANCOMYCIN HCL 1500 MG/300ML IV SOLN
1500.0000 mg | Freq: Once | INTRAVENOUS | Status: AC
  Administered 2021-04-22: 1500 mg via INTRAVENOUS
  Filled 2021-04-22: qty 300

## 2021-04-22 MED ORDER — DONEPEZIL HCL 10 MG PO TABS
5.0000 mg | ORAL_TABLET | Freq: Every day | ORAL | Status: DC
  Administered 2021-04-25 – 2021-04-27 (×3): 5 mg via ORAL
  Filled 2021-04-22 (×5): qty 1

## 2021-04-22 MED ORDER — LACTATED RINGERS IV BOLUS (SEPSIS)
1000.0000 mL | Freq: Once | INTRAVENOUS | Status: AC
  Administered 2021-04-22: 1000 mL via INTRAVENOUS

## 2021-04-22 MED ORDER — SENNOSIDES-DOCUSATE SODIUM 8.6-50 MG PO TABS
2.0000 | ORAL_TABLET | Freq: Two times a day (BID) | ORAL | Status: DC
  Administered 2021-04-25 – 2021-04-27 (×4): 2 via ORAL
  Filled 2021-04-22 (×6): qty 2

## 2021-04-22 MED ORDER — SODIUM CHLORIDE 0.9 % IV SOLN
2.0000 g | Freq: Two times a day (BID) | INTRAVENOUS | Status: DC
  Administered 2021-04-23 (×3): 2 g via INTRAVENOUS
  Filled 2021-04-22 (×3): qty 2

## 2021-04-22 MED ORDER — TAMSULOSIN HCL 0.4 MG PO CAPS
0.4000 mg | ORAL_CAPSULE | Freq: Every day | ORAL | Status: DC
  Administered 2021-04-25 – 2021-04-27 (×3): 0.4 mg via ORAL
  Filled 2021-04-22 (×3): qty 1

## 2021-04-22 MED ORDER — VANCOMYCIN HCL IN DEXTROSE 1-5 GM/200ML-% IV SOLN
1000.0000 mg | Freq: Once | INTRAVENOUS | Status: DC
  Filled 2021-04-22: qty 200

## 2021-04-22 MED ORDER — DIVALPROEX SODIUM 125 MG PO CSDR
500.0000 mg | DELAYED_RELEASE_CAPSULE | Freq: Two times a day (BID) | ORAL | Status: DC
  Filled 2021-04-22 (×2): qty 4

## 2021-04-22 MED ORDER — METRONIDAZOLE 500 MG/100ML IV SOLN
500.0000 mg | Freq: Once | INTRAVENOUS | Status: AC
  Administered 2021-04-22: 500 mg via INTRAVENOUS
  Filled 2021-04-22: qty 100

## 2021-04-22 MED ORDER — POLYETHYLENE GLYCOL 3350 17 G PO PACK
17.0000 g | PACK | Freq: Every day | ORAL | Status: DC
  Administered 2021-04-27: 17 g via ORAL
  Filled 2021-04-22 (×3): qty 1

## 2021-04-22 NOTE — ED Provider Notes (Signed)
[facility] EMERGENCY DEPARTMENT Provider Note   CSN: [id number] Arrival date & time: 04/22/21  1238     History  Chief Complaint  Patient presents with   Altered Mental Status    Jeffery Haynes is a 88 y.o. male.  Jeffery Haynes is a 88 y.o. male with history of Alzheimer's dementia, stroke, diabetes, who presents to the emergency department via EMS from [facility] facility for evaluation of altered mental status.  Patient is currently nonverbal and not responding to any commands so history is very limited.  Nursing staff report that today patient was altered from baseline, since around approximately 11 AM, staff is unsure if symptoms are present sooner patient has been nonverbal with persistent left gaze deviation.  History of prior stroke, but staff does not think left gaze deviation was present previously.  They report at baseline he has difficulty with movement but is usually able to move his right side more.  They deny any known fevers at facility but patient was febrile at 101.1 with EMS and tachycardic.  Family is not currently at bedside and patient is unable to provide any additional history  The history is provided by the EMS personnel and the nursing home. The history is limited by the condition of the patient.      Home Medications Prior to Admission medications   Medication Sig Start Date End Date Taking? Authorizing Provider  acetaminophen (TYLENOL) 325 MG tablet Take 650 mg by mouth 4 (four) times daily.    [provider]  amLODipine (NORVASC) 5 MG tablet Take 5 mg by mouth in the morning.    [provider]  citalopram (CELEXA) 10 MG tablet Take 15 mg by mouth in the morning.    [provider]  divalproex (DEPAKOTE SPRINKLE) 125 MG capsule Take 4 capsules (500 mg total) by mouth every 12 (twelve) hours. 12/27/20   Oswald Hillock, MD  donepezil (ARICEPT) 5 MG tablet Take 1 tablet (5 mg total) by mouth  at bedtime. 02/01/20 01/31/21  Pokhrel, Corrie Mckusick, MD  insulin aspart (NOVOLOG) 100 UNIT/ML injection Inject 0-9 Units into the skin 3 (three) times daily with meals. Sliding scale insulin Less than 70 initiate hypoglycemia protocol 70-120  0 units 120-150 1 unit 151-200 2 units 201-250 3 units 251-300 5 units 301-350 7 units 351-400 9 units  Greater than 400 call MD 12/27/20   Oswald Hillock, MD  lidocaine (LIDODERM) 5 % Place 1 patch onto the skin daily. Remove & Discard patch within 12 hours or as directed by MD Patient taking differently: Place 1 patch onto the skin daily. 06/10/20   Georgette Shell, MD  metFORMIN (GLUCOPHAGE) 500 MG tablet Take 500 mg by mouth 2 (two) times daily.    [provider]  OLANZapine (ZYPREXA) 2.5 MG tablet Take 2.5 mg by mouth See admin instructions. At bedtime on Monday,Tuesday,Wednesday,Thursday and saturday 05/25/20   [provider]  polyethylene glycol powder (GLYCOLAX/MIRALAX) 17 GM/SCOOP powder Take 17 g by mouth daily.    [provider]  sennosides-docusate sodium (SENOKOT-S) 8.6-50 MG tablet Take 2 tablets by mouth 2 (two) times daily.    [provider]  tamsulosin (FLOMAX) 0.4 MG CAPS capsule Take 0.4 mg by mouth at bedtime.    [provider]  traZODone (DESYREL) 50 MG tablet Take 1 tablet (50 mg total) by mouth at bedtime as needed for sleep. 12/27/20   Oswald Hillock, MD  Allergies    Olanzapine    Review of Systems   Review of Systems  Unable to perform ROS: Mental status change   Physical Exam Updated Vital Signs BP (!) 182/92 (BP Location: Right Arm)    Pulse 86    Temp 99.6 F (37.6 C) (Oral)    Resp 20    SpO2 98%  Physical Exam Vitals and nursing note reviewed.  Constitutional:      General: He is not in acute distress.    Appearance: He is well-developed. He is not diaphoretic.     Comments: Alert but not responding or following any commands, chronically ill-appearing  HENT:      Head: Normocephalic and atraumatic.     Mouth/Throat:     Mouth: Mucous membranes are moist.     Pharynx: Oropharynx is clear.  Eyes:     General:        Right eye: No discharge.        Left eye: No discharge.     Comments: Left gaze deviation, PERRLA, conjunctiva normal  Cardiovascular:     Rate and Rhythm: Regular rhythm. Tachycardia present.     Pulses: Normal pulses.     Heart sounds: Normal heart sounds. No murmur heard.   No friction rub. No gallop.     Comments: Tachycardia with regular rhythm Pulmonary:     Effort: Pulmonary effort is normal. No respiratory distress.     Breath sounds: Normal breath sounds. No wheezing or rales.     Comments: Respirations equal and unlabored, patient nonverbal, but satting well on room air, breath sounds diminished bilaterally but without focal wheezing, rales or rhonchi. Abdominal:     General: Bowel sounds are normal. There is no distension.     Palpations: Abdomen is soft. There is no mass.     Tenderness: There is no abdominal tenderness. There is no guarding.     Comments: Abdomen soft, nondistended, nontender to palpation in all quadrants without guarding or peritoneal signs  Musculoskeletal:        General: No deformity.     Cervical back: Neck supple.  Skin:    General: Skin is warm and dry.     Capillary Refill: Capillary refill takes less than 2 seconds.  Neurological:     Mental Status: He is alert.     Coordination: Coordination normal.     Comments: Neuro exam is very limited as patient is not following commands and is nonverbal Left gaze deviation noted but no facial droop noted Bilateral upper extremities are contracted patient resists some to pull Withdraws all 4 extremities to painful stimuli.   Psychiatric:        Mood and Affect: Mood normal.        Behavior: Behavior normal.    ED Results / Procedures / Treatments   Labs (all labs ordered are listed, but only abnormal results are displayed) Labs Reviewed   RESP PANEL BY RT-PCR (FLU A&B, COVID) ARPGX2 - Abnormal; Notable for the following components:      Result Value   SARS Coronavirus 2 by RT PCR POSITIVE (*)    All other components within normal limits  LACTIC ACID, PLASMA - Abnormal; Notable for the following components:   Lactic Acid, Venous 2.0 (*)    All other components within normal limits  COMPREHENSIVE METABOLIC PANEL - Abnormal; Notable for the following components:   Glucose, Bld 187 (*)    All other components within normal limits  CBC WITH  DIFFERENTIAL/PLATELET - Abnormal; Notable for the following components:   WBC 14.3 (*)    Neutro Abs 12.6 (*)    Abs Immature Granulocytes 0.11 (*)    All other components within normal limits  URINALYSIS, ROUTINE W REFLEX MICROSCOPIC - Abnormal; Notable for the following components:   Color, Urine STRAW (*)    Glucose, UA 150 (*)    Ketones, ur 5 (*)    All other components within normal limits  CULTURE, BLOOD (ROUTINE X 2)  CULTURE, BLOOD (ROUTINE X 2)  URINE CULTURE  PROTIME-INR  APTT  LACTIC ACID, PLASMA  BLOOD GAS, VENOUS  CBC  PROCALCITONIN  PROCALCITONIN  AMMONIA  HEMOGLOBIN A1C  C-REACTIVE PROTEIN  D-DIMER, QUANTITATIVE  FERRITIN  FIBRINOGEN  LACTATE DEHYDROGENASE  CBC WITH DIFFERENTIAL/PLATELET  COMPREHENSIVE METABOLIC PANEL  C-REACTIVE PROTEIN  D-DIMER, QUANTITATIVE  FERRITIN  MAGNESIUM  PHOSPHORUS    EKG EKG Interpretation  Date/Time:  Wednesday April 22 2021 12:49:29 EST Ventricular Rate:  102 PR Interval:  154 QRS Duration: 85 QT Interval:  302 QTC Calculation: 394 R Axis:   33 Text Interpretation: Sinus tachycardia Nonspecific repol abnormality, lateral leads Confirmed by Fredia Sorrow [id number] on 04/22/2021 1:13:39 PM  Radiology CT ANGIO HEAD NECK W WO CM  Result Date: 04/22/2021 CLINICAL DATA:  Acute neuro deficit.  Suspect stroke. EXAM: CT ANGIOGRAPHY HEAD AND NECK TECHNIQUE: Multidetector CT imaging of the head and neck was performed  using the standard protocol during bolus administration of intravenous contrast. Multiplanar CT image reconstructions and MIPs were obtained to evaluate the vascular anatomy. Carotid stenosis measurements (when applicable) are obtained utilizing NASCET criteria, using the distal internal carotid diameter as the denominator. RADIATION DOSE REDUCTION: This exam was performed according to the departmental dose-optimization program which includes automated exposure control, adjustment of the mA and/or kV according to patient size and/or use of iterative reconstruction technique. CONTRAST:  112mL OMNIPAQUE IOHEXOL 350 MG/ML SOLN COMPARISON:  CT head 04/22/2021.  CT angio head and neck 12/12/2020 FINDINGS: CTA NECK FINDINGS Aortic arch: Standard branching. Imaged portion shows no evidence of aneurysm or dissection. No significant stenosis of the major arch vessel origins. Mild atherosclerotic disease aortic arch. Right carotid system: Mild atherosclerotic calcification right carotid bulb. Negative for right carotid stenosis Left carotid system: Mild atherosclerotic calcification left carotid bulb. Negative for stenosis Vertebral arteries: Moderate to severe stenosis at the origin of the vertebral artery bilaterally due to calcific plaque. Both vertebral arteries are then patent to the basilar. Skeleton: Multilevel cervical disc degeneration. No acute skeletal abnormality. Other neck: Negative for mass or adenopathy. Upper chest: Lung apices clear bilaterally. Review of the MIP images confirms the above findings CTA HEAD FINDINGS Anterior circulation: Atherosclerotic calcification throughout the cavernous carotid bilaterally causing mild to moderate stenosis bilaterally. 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 anterior cerebral artery infarct. Moderate stenosis proximal right M1 segment. Severe stenosis inferior division of right MCA. Mild disease in the superior division of the right MCA. Left middle cerebral  artery patent with scattered mild atherosclerotic disease. Posterior circulation: Both vertebral arteries patent to the basilar. PICA patent on the left. Right PICA appears supplied from the right AICA. Basilar widely patent. Superior cerebellar and posterior cerebral arteries patent bilaterally. Mild atherosclerotic disease in the posterior cerebral artery bilaterally. Venous sinuses: Normal venous enhancement Anatomic variants: None Review of the MIP images confirms the above findings IMPRESSION: 1. 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 anterior cerebral artery infarct. This is unchanged from the prior CTA.  2. Moderate stenosis right M1 segment. Severe stenosis inferior division right middle cerebral artery, this appeared occluded previously and has recanalized in the interval. 3. Mild atherosclerotic disease of the carotid bifurcation bilaterally without significant stenosis 4. Moderate to severe stenosis at the origin of the vertebral artery bilaterally. Electronically Signed   By: Franchot Gallo M.D.   On: 04/22/2021 16:16   CT HEAD WO CONTRAST (5MM)  Result Date: 04/22/2021 CLINICAL DATA:  Altered mental status. EXAM: CT HEAD WITHOUT CONTRAST TECHNIQUE: Contiguous axial images were obtained from the base of the skull through the vertex without intravenous contrast. RADIATION DOSE REDUCTION: This exam was performed according to the departmental dose-optimization program which includes automated exposure control, adjustment of the mA and/or kV according to patient size and/or use of iterative reconstruction technique. COMPARISON:  March 02, 2021. FINDINGS: Brain: Right parietal encephalomalacia is noted. Mild diffuse cortical atrophy is noted. Mild chronic ischemic white matter disease is noted. No mass effect or midline shift is noted. Ventricular size is within normal limits. There is no evidence of mass lesion, hemorrhage or acute infarction. Vascular: No hyperdense vessel or  unexpected calcification. Skull: Normal. Negative for fracture or focal lesion. Sinuses/Orbits: No acute finding. Other: None. IMPRESSION: No acute intracranial abnormality seen. Electronically Signed   By: Marijo Conception M.D.   On: 04/22/2021 14:37   CT CHEST ABDOMEN PELVIS W CONTRAST  Result Date: 04/22/2021 CLINICAL DATA:  Sepsis EXAM: CT CHEST, ABDOMEN, AND PELVIS WITH CONTRAST TECHNIQUE: Multidetector CT imaging of the chest, abdomen and pelvis was performed following the standard protocol during bolus administration of intravenous contrast. RADIATION DOSE REDUCTION: This exam was performed according to the departmental dose-optimization program which includes automated exposure control, adjustment of the mA and/or kV according to patient size and/or use of iterative reconstruction technique. CONTRAST:  157mL OMNIPAQUE IOHEXOL 350 MG/ML SOLN COMPARISON:  None. FINDINGS: CT CHEST FINDINGS Cardiovascular: Normal heart size. No pericardial effusion. Left main and three-vessel coronary artery calcifications. Atherosclerotic disease of the thoracic aorta. Mediastinum/Nodes: Patulous esophagus. Thyroid is unremarkable. No pathologically enlarged lymph nodes seen in the chest. Lungs/Pleura: Central airways are patent. Linear opacities of the bilateral lower lobes which are likely due to scarring or atelectasis. No consolidation, pleural effusion or pneumothorax. Musculoskeletal: Old posterior left-sided rib fractures. No chest wall mass or suspicious bone lesions identified. CT ABDOMEN PELVIS FINDINGS Hepatobiliary: No focal liver abnormality is seen. No gallstones, gallbladder wall thickening, or biliary dilatation. Pancreas: Unremarkable. No pancreatic ductal dilatation or surrounding inflammatory changes. Spleen: Normal in size without focal abnormality. Adrenals/Urinary Tract: No hydronephrosis or nephrolithiasis. Renal vascular calcifications with no definite nephrolithiasis. Bladder wall thickening.  Stomach/Bowel: Stomach is within normal limits. Appendix appears normal. Diverticulosis. No evidence of bowel wall thickening, distention, or inflammatory changes. Vascular/Lymphatic: Aortic atherosclerosis. No enlarged abdominal or pelvic lymph nodes. Reproductive: Mild prostatomegaly. Other: Tiny fat containing umbilical hernia. No abdominopelvic ascites. Musculoskeletal: Unchanged mild compression deformities of the thoracic and lumbar spine. No acute or significant osseous findings. IMPRESSION: 1. No acute findings in the chest, abdomen or pelvis. 2.  Aortic Atherosclerosis (ICD10-I70.0). Electronically Signed   By: Yetta Glassman M.D.   On: 04/22/2021 16:10   DG Chest Port 1 View  Result Date: 04/22/2021 CLINICAL DATA:  Questionable sepsis EXAM: PORTABLE CHEST 1 VIEW COMPARISON:  12/17/2020 FINDINGS: Normal mediastinum and cardiac silhouette. Normal pulmonary vasculature. No evidence of effusion, infiltrate, or pneumothorax. No acute bony abnormality. IMPRESSION: No acute cardiopulmonary process. Electronically Signed   By: Nicole Kindred  Leonia Reeves M.D.   On: 04/22/2021 13:40    Procedures .Critical Care Performed by: Jacqlyn Larsen, PA-C Authorized by: Jacqlyn Larsen, PA-C   Critical care provider statement:    Critical care time (minutes):  45   Critical care time was exclusive of:  Separately billable procedures and treating other patients and teaching time   Critical care was necessary to treat or prevent imminent or life-threatening deterioration of the following conditions:  Sepsis   Critical care was time spent personally by me on the following activities:  Development of treatment plan with patient or surrogate, discussions with consultants, evaluation of patient's response to treatment, examination of patient, ordering and review of laboratory studies, ordering and review of radiographic studies, ordering and performing treatments and interventions, pulse oximetry, re-evaluation of patient's  condition and review of old charts   Care discussed with: admitting provider      Medications Ordered in ED Medications  lactated ringers infusion ( Intravenous New Bag/Given 04/22/21 1329)  ceFEPIme (MAXIPIME) 2 g in sodium chloride 0.9 % 100 mL IVPB (has no administration in time range)  vancomycin (VANCOREADY) IVPB 1250 mg/250 mL (has no administration in time range)  acetaminophen (TYLENOL) suppository 650 mg (has no administration in time range)  acetaminophen (TYLENOL) tablet 650 mg (has no administration in time range)  citalopram (CELEXA) tablet 15 mg (has no administration in time range)  donepezil (ARICEPT) tablet 5 mg (has no administration in time range)  traZODone (DESYREL) tablet 50 mg (has no administration in time range)  polyethylene glycol powder (GLYCOLAX/MIRALAX) container 17 g (has no administration in time range)  sennosides-docusate sodium (SENOKOT-S) 8.6-50 MG tablet 2 tablet (has no administration in time range)  divalproex (DEPAKOTE SPRINKLE) capsule 500 mg (has no administration in time range)  tamsulosin (FLOMAX) capsule 0.4 mg (has no administration in time range)  insulin aspart (novoLOG) injection 0-15 Units (has no administration in time range)  heparin injection 5,000 Units (has no administration in time range)  remdesivir 200 mg in sodium chloride 0.9% 250 mL IVPB (has no administration in time range)    Followed by  remdesivir 100 mg in sodium chloride 0.9 % 100 mL IVPB (has no administration in time range)  labetalol (NORMODYNE) injection 10 mg (has no administration in time range)  lactated ringers bolus 1,000 mL (0 mLs Intravenous Stopped 04/22/21 1437)  ceFEPIme (MAXIPIME) 2 g in sodium chloride 0.9 % 100 mL IVPB (0 g Intravenous Stopped 04/22/21 1410)  metroNIDAZOLE (FLAGYL) IVPB 500 mg (0 mg Intravenous Stopped 04/22/21 1437)  vancomycin (VANCOREADY) IVPB 1500 mg/300 mL (0 mg Intravenous Stopped 04/22/21 1624)  iohexol (OMNIPAQUE) 350 MG/ML injection 100 mL  (100 mLs Intravenous Contrast Given 04/22/21 1558)    ED Course/ Medical Decision Making/ A&P                           Jeffery Haynes is a 88 y.o. male presents to the ED for concern of altered mental status and fever, this involves an extensive number of treatment options, and is a complaint that carries with it a high risk of complications and morbidity.  The differential diagnosis includes pneumonia, urinary tract infection, COVID, influenza, intra-abdominal infection, stroke, metabolic derangement, meningitis.  History is very limited, and evaluation is complicated by comorbidities Alzheimer's, stroke and diabetes   Additional history obtained:  Additional history obtained from nursing home staff and External records from outside source obtained and reviewed including prior ED encounters  and hospitalizations   Lab Tests:  I Ordered, reviewed, and interpreted labs.  The pertinent results include: Leukocytosis of 14.3, normal hemoglobin, glucose 187 but otherwise electrolytes are normal and patient has normal renal and liver function.  Initial lactic acid is mildly elevated at 2.0, repeat after IV fluids has normalized, UA without signs of infection. COVID-positive, cycle threshold though is 39.6 so question how acute this COVID infection is Blood cultures pending   Imaging Studies ordered:  I ordered imaging studies including chest x-ray, CT head I independently visualized and interpreted imaging which showed chest x-ray with no evidence of pneumonia or other acute abnormality, and CT head without evidence of bleed or acute infarct Without clear source for infection CT chest, abdomen and pelvis ordered as well with no evidence of occult pneumonia or acute intra-abdominal abnormalities on my independent interpretation. I agree with the radiologist interpretation   Cardiac Monitoring:  The patient was maintained on a cardiac monitor.  I personally viewed and interpreted the  cardiac monitored which showed an underlying rhythm of: Initially sinus tachycardia, but this has resolved with fluids and treatment of fever   Medicines ordered and prescription drug management:  I ordered medication including IV fluid bolus, rectal Tylenol, broad-spectrum antibiotics for sepsis of unknown source Reevaluation of the patient after these medicines showed that the patient improved I have reviewed the patients home medicines and have made adjustments as needed   Critical Interventions:  Code sepsis initiated and patient treated with broad-spectrum antibiotics, patient given 1 L fluid bolus but awaiting initial lactic acid before giving larger 30 cc/kg fluid bolus, patient remains normotensive   Consultations Obtained:  I requested consultation with the neurologist, Dr. Reeves Forth,  and discussed lab and imaging findings as well as pertinent plan - they recommend: CTA head and neck and will review imaging and see patient. Neurologist reviewed CTA head and neck, no evidence of LVO, high clinical suspicion for infection driving altered mental status, consider LP if mental status is not improving with IV hydration and antibiotics.   ED Course:  Patient arrived with fever and altered mental status with underlying dementia, work-up has not revealed a clear source for infection aside from positive COVID test, although it is unclear how acute this is this is the first day patient has had known fevers.  He does have a leukocytosis.  Chest and abdominal imaging reassuring as well.  Blood cultures are pending. Tachycardia has resolved with treatment of fever and IV fluids and patient has not had any episodes of hypotension, no signs of septic shock. Patient will need admission for continued IV antibiotics and monitoring of mental status. Discussed with patient's daughter at bedside and with patient's wife via telephone, they agree with plan for admission and IV antibiotics, MRI ordered for  further evaluation of altered mental status and to rule out concomitant stroke given left gaze deviation. Discussed doing a potential LP to rule out CNS infection given altered mental status and fever, discussed risks, and benefits of this procedure, patient's wife does not want him to have this invasive procedure at this time which I feel is reasonable.   Dispostion:  After consideration of the diagnostic results and the patients response to treatment feel that the patent would benefit from admission.  Medicine consult placed for admission and discussed pertinent labs, imaging and plan with Dr. Roosevelt Locks with [facility] hospitalist who will see and admit the patient.         Final Clinical Impression(s) / ED  Diagnoses Final diagnoses:  Altered mental status, unspecified altered mental status type    Rx / DC Orders ED Discharge Orders     None         Janet Berlin 04/22/21 2153    Fredia Sorrow, MD 04/23/21 [phone]

## 2021-04-22 NOTE — Sepsis Progress Note (Signed)
eLink monitoring code sepsis.  

## 2021-04-22 NOTE — Progress Notes (Signed)
EEG complete - results pending 

## 2021-04-22 NOTE — ED Triage Notes (Signed)
Pt BIB [facility] from [facility]. Pt has dementia at baseline and is normally able to speak in partial sentences, will track with eye movement, and follows simple commands. Staff today reports pt is altered from baseline, pt currently nonverbal and has L gaze deviation. Unknown LKW. Hx stroke. 20g R FA.   EMS VS- temp 101.1, HR 120, RR 24, BP 174/128

## 2021-04-22 NOTE — ED Notes (Signed)
Patient transported to CT 

## 2021-04-22 NOTE — H&P (Signed)
History and Physical    Lamont Shells [id number] DOB: 30-Jul-1931 DOA: 04/22/2021  PCP: Garwin Brothers, MD (Confirm with patient/family/NH records and if not entered, this has to be entered at [facility] point of entry) Patient coming from: Nursing home  I have personally briefly reviewed patient's old medical records in [place]  Chief Complaint: Patient unresponsive  HPI: Calvert Polson is a 88 y.o. male with medical history significant of advanced dementia, IDDM with left sided residual weakness, HTN, BPH, who was sent from nursing home for altered mentation.  Patient unresponsive, history provided by patient daughter at bedside along with ED record and nursing home records.  Baseline, patient 63 dementia, able to feed himself, no history of aspiration.  This morning, patient was noted to be nonverbal and unresponsive.  ED Course: Found febrile temperature 100.9, tachycardia, blood pressure elevated, no hypoxia.  WBC 14.3, COVID positive and CT chest abdomen pelvis showed acute no acute findings.  UA showed no nitrite no leukocyte. Acid 2.0.  ED discussed with patient and family regarding lumbar puncture, family declined.  Neurology was consulted who ordered MRI.  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  Moderate stenosis of right M1 segment and severe stenosis inferior division right MCA.   Review of Systems: Unable to perform, patient has altered mentations.  Past Medical History:  Diagnosis Date   Alzheimer disease ([place])    Dementia ([place])    Diabetes mellitus without complication ([place])    Stroke [facility])     History reviewed. No pertinent surgical history.   reports that he has never smoked. He has never used smokeless tobacco. He reports that he does not currently use alcohol. He reports that he does not use drugs.  Allergies  Allergen Reactions   Olanzapine     Possible neuroleptic malignant syndrome. Pt has been  tolerating at [facility] in [facility]22 for months.    Family History  Problem Relation Age of Onset   Seizures Neg Hx      Prior to Admission medications   Medication Sig Start Date End Date Taking? Authorizing Provider  acetaminophen (TYLENOL) 325 MG tablet Take 650 mg by mouth 4 (four) times daily.    [provider]  amLODipine (NORVASC) 5 MG tablet Take 5 mg by mouth in the morning.    [provider]  citalopram (CELEXA) 10 MG tablet Take 15 mg by mouth in the morning.    [provider]  divalproex (DEPAKOTE SPRINKLE) 125 MG capsule Take 4 capsules (500 mg total) by mouth every 12 (twelve) hours. 12/27/20   Oswald Hillock, MD  donepezil (ARICEPT) 5 MG tablet Take 1 tablet (5 mg total) by mouth at bedtime. 02/01/20 01/31/21  Pokhrel, Corrie Mckusick, MD  insulin aspart (NOVOLOG) 100 UNIT/ML injection Inject 0-9 Units into the skin 3 (three) times daily with meals. Sliding scale insulin Less than 70 initiate hypoglycemia protocol 70-120  0 units 120-150 1 unit 151-200 2 units 201-250 3 units 251-300 5 units 301-350 7 units 351-400 9 units  Greater than 400 call MD 12/27/20   Oswald Hillock, MD  lidocaine (LIDODERM) 5 % Place 1 patch onto the skin daily. Remove & Discard patch within 12 hours or as directed by MD Patient taking differently: Place 1 patch onto the skin daily. 06/10/20   Georgette Shell, MD  metFORMIN (GLUCOPHAGE) 500 MG tablet Take 500 mg by mouth 2 (two) times daily.    [provider]  OLANZapine (ZYPREXA) 2.5 MG tablet Take 2.5 mg by mouth See admin instructions. At bedtime on Monday,Tuesday,Wednesday,Thursday and saturday 05/25/20   [provider]  polyethylene glycol powder (GLYCOLAX/MIRALAX) 17 GM/SCOOP powder Take 17 g by mouth daily.    [provider]  sennosides-docusate sodium (SENOKOT-S) 8.6-50 MG tablet Take 2 tablets by mouth 2 (two) times daily.    [provider]  tamsulosin (FLOMAX) 0.4 MG CAPS capsule Take  0.4 mg by mouth at bedtime.    [provider]  traZODone (DESYREL) 50 MG tablet Take 1 tablet (50 mg total) by mouth at bedtime as needed for sleep. 12/27/20   Oswald Hillock, MD    Physical Exam: Vitals:   04/22/21 1415 04/22/21 1430 04/22/21 1500 04/22/21 1623  BP: (!) 192/103 (!) 177/139 (!) 185/88 (!) 182/92  Pulse: (!) 102 99  86  Resp: (!) 24 16 20 20   Temp:    99.6 F (37.6 C)  TempSrc:    Oral  SpO2: 99% 99%  98%    Constitutional: NAD, calm, comfortable Vitals:   04/22/21 1415 04/22/21 1430 04/22/21 1500 04/22/21 1623  BP: (!) 192/103 (!) 177/139 (!) 185/88 (!) 182/92  Pulse: (!) 102 99  86  Resp: (!) 24 16 20 20   Temp:    99.6 F (37.6 C)  TempSrc:    Oral  SpO2: 99% 99%  98%   Eyes: PERRL, lids and conjunctivae normal ENMT: Mucous membranes are dry. Posterior pharynx clear of any exudate or lesions.Normal dentition.  Neck: normal, supple, no masses, no thyromegaly Respiratory: clear to auscultation bilaterally, no wheezing, no crackles. Normal respiratory effort. No accessory muscle use.  Cardiovascular: Regular rate and rhythm, no murmurs / rubs / gallops. No extremity edema. 2+ pedal pulses. No carotid bruits.  Abdomen: no tenderness, no masses palpated. No hepatosplenomegaly. Bowel sounds positive.  Musculoskeletal: no clubbing / cyanosis. No joint deformity upper and lower extremities. Good ROM, no contractures. Normal muscle tone.  Skin: no rashes, lesions, ulcers. No induration Neurologic: No facial droops, moving limbs, not following commands Psychiatric: Opens eyes to voice, not following commands.  (  Labs on Admission: I have personally reviewed following labs and imaging studies  CBC: Recent Labs  Lab 04/22/21 1308  WBC 14.3*  NEUTROABS 12.6*  HGB 13.7  HCT 42.3  MCV 90.0  PLT [id number]   Basic Metabolic Panel: Recent Labs  Lab 04/22/21 1308  NA 135  K 5.0  CL 100  CO2 25  GLUCOSE 187*  BUN 22  CREATININE 0.86  CALCIUM 9.2    GFR: CrCl cannot be calculated (Unknown ideal weight.). Liver Function Tests: Recent Labs  Lab 04/22/21 1308  AST 16  ALT 11  ALKPHOS 87  BILITOT 0.7  PROT 7.2  ALBUMIN 3.7   No results for input(s): LIPASE, AMYLASE in the last 168 hours. No results for input(s): AMMONIA in the last 168 hours. Coagulation Profile: Recent Labs  Lab 04/22/21 1415  INR 1.0   Cardiac Enzymes: No results for input(s): CKTOTAL, CKMB, CKMBINDEX, TROPONINI in the last 168 hours. BNP (last 3 results) No results for input(s): PROBNP in the last 8760 hours. HbA1C: No results for input(s): HGBA1C in the last 72 hours. CBG: No results for input(s): GLUCAP in the last 168 hours. Lipid Profile: No results for input(s): CHOL, HDL, LDLCALC, TRIG, CHOLHDL, LDLDIRECT in the last 72 hours. Thyroid Function Tests: No results for input(s): TSH, T4TOTAL, FREET4, T3FREE, THYROIDAB in the last 72 hours. Anemia Panel:  No results for input(s): VITAMINB12, FOLATE, FERRITIN, TIBC, IRON, RETICCTPCT in the last 72 hours. Urine analysis:    Component Value Date/Time   COLORURINE STRAW (A) 04/22/2021 1449   APPEARANCEUR CLEAR 04/22/2021 1449   LABSPEC 1.015 04/22/2021 1449   PHURINE 7.0 04/22/2021 1449   GLUCOSEU 150 (A) 04/22/2021 1449   HGBUR NEGATIVE 04/22/2021 1449   BILIRUBINUR NEGATIVE 04/22/2021 1449   KETONESUR 5 (A) 04/22/2021 1449   PROTEINUR NEGATIVE 04/22/2021 1449   NITRITE NEGATIVE 04/22/2021 1449   LEUKOCYTESUR NEGATIVE 04/22/2021 1449    Radiological Exams on Admission: CT ANGIO HEAD NECK W WO CM  Result Date: 04/22/2021 CLINICAL DATA:  Acute neuro deficit.  Suspect stroke. EXAM: CT ANGIOGRAPHY HEAD AND NECK TECHNIQUE: Multidetector CT imaging of the head and neck was performed using the standard protocol during bolus administration of intravenous contrast. Multiplanar CT image reconstructions and MIPs were obtained to evaluate the vascular anatomy. Carotid stenosis measurements (when  applicable) are obtained utilizing NASCET criteria, using the distal internal carotid diameter as the denominator. RADIATION DOSE REDUCTION: This exam was performed according to the departmental dose-optimization program which includes automated exposure control, adjustment of the mA and/or kV according to patient size and/or use of iterative reconstruction technique. CONTRAST:  139mL OMNIPAQUE IOHEXOL 350 MG/ML SOLN COMPARISON:  CT head 04/22/2021.  CT angio head and neck 12/12/2020 FINDINGS: CTA NECK FINDINGS Aortic arch: Standard branching. Imaged portion shows no evidence of aneurysm or dissection. No significant stenosis of the major arch vessel origins. Mild atherosclerotic disease aortic arch. Right carotid system: Mild atherosclerotic calcification right carotid bulb. Negative for right carotid stenosis Left carotid system: Mild atherosclerotic calcification left carotid bulb. Negative for stenosis Vertebral arteries: Moderate to severe stenosis at the origin of the vertebral artery bilaterally due to calcific plaque. Both vertebral arteries are then patent to the basilar. Skeleton: Multilevel cervical disc degeneration. No acute skeletal abnormality. Other neck: Negative for mass or adenopathy. Upper chest: Lung apices clear bilaterally. Review of the MIP images confirms the above findings CTA HEAD FINDINGS Anterior circulation: Atherosclerotic calcification throughout the cavernous carotid bilaterally causing mild to moderate stenosis bilaterally. 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 anterior cerebral artery infarct. Moderate stenosis proximal right M1 segment. Severe stenosis inferior division of right MCA. Mild disease in the superior division of the right MCA. Left middle cerebral artery patent with scattered mild atherosclerotic disease. Posterior circulation: Both vertebral arteries patent to the basilar. PICA patent on the left. Right PICA appears supplied from the right AICA. Basilar  widely patent. Superior cerebellar and posterior cerebral arteries patent bilaterally. Mild atherosclerotic disease in the posterior cerebral artery bilaterally. Venous sinuses: Normal venous enhancement Anatomic variants: None Review of the MIP images confirms the above findings IMPRESSION: 1. 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 anterior cerebral artery infarct. This is unchanged from the prior CTA. 2. Moderate stenosis right M1 segment. Severe stenosis inferior division right middle cerebral artery, this appeared occluded previously and has recanalized in the interval. 3. Mild atherosclerotic disease of the carotid bifurcation bilaterally without significant stenosis 4. Moderate to severe stenosis at the origin of the vertebral artery bilaterally. Electronically Signed   By: Franchot Gallo M.D.   On: 04/22/2021 16:16   CT HEAD WO CONTRAST (5MM)  Result Date: 04/22/2021 CLINICAL DATA:  Altered mental status. EXAM: CT HEAD WITHOUT CONTRAST TECHNIQUE: Contiguous axial images were obtained from the base of the skull through the vertex without intravenous contrast. RADIATION DOSE REDUCTION: This exam was  performed according to the departmental dose-optimization program which includes automated exposure control, adjustment of the mA and/or kV according to patient size and/or use of iterative reconstruction technique. COMPARISON:  March 02, 2021. FINDINGS: Brain: Right parietal encephalomalacia is noted. Mild diffuse cortical atrophy is noted. Mild chronic ischemic white matter disease is noted. No mass effect or midline shift is noted. Ventricular size is within normal limits. There is no evidence of mass lesion, hemorrhage or acute infarction. Vascular: No hyperdense vessel or unexpected calcification. Skull: Normal. Negative for fracture or focal lesion. Sinuses/Orbits: No acute finding. Other: None. IMPRESSION: No acute intracranial abnormality seen. Electronically Signed   By: Marijo Conception M.D.   On: 04/22/2021 14:37   CT CHEST ABDOMEN PELVIS W CONTRAST  Result Date: 04/22/2021 CLINICAL DATA:  Sepsis EXAM: CT CHEST, ABDOMEN, AND PELVIS WITH CONTRAST TECHNIQUE: Multidetector CT imaging of the chest, abdomen and pelvis was performed following the standard protocol during bolus administration of intravenous contrast. RADIATION DOSE REDUCTION: This exam was performed according to the departmental dose-optimization program which includes automated exposure control, adjustment of the mA and/or kV according to patient size and/or use of iterative reconstruction technique. CONTRAST:  175mL OMNIPAQUE IOHEXOL 350 MG/ML SOLN COMPARISON:  None. FINDINGS: CT CHEST FINDINGS Cardiovascular: Normal heart size. No pericardial effusion. Left main and three-vessel coronary artery calcifications. Atherosclerotic disease of the thoracic aorta. Mediastinum/Nodes: Patulous esophagus. Thyroid is unremarkable. No pathologically enlarged lymph nodes seen in the chest. Lungs/Pleura: Central airways are patent. Linear opacities of the bilateral lower lobes which are likely due to scarring or atelectasis. No consolidation, pleural effusion or pneumothorax. Musculoskeletal: Old posterior left-sided rib fractures. No chest wall mass or suspicious bone lesions identified. CT ABDOMEN PELVIS FINDINGS Hepatobiliary: No focal liver abnormality is seen. No gallstones, gallbladder wall thickening, or biliary dilatation. Pancreas: Unremarkable. No pancreatic ductal dilatation or surrounding inflammatory changes. Spleen: Normal in size without focal abnormality. Adrenals/Urinary Tract: No hydronephrosis or nephrolithiasis. Renal vascular calcifications with no definite nephrolithiasis. Bladder wall thickening. Stomach/Bowel: Stomach is within normal limits. Appendix appears normal. Diverticulosis. No evidence of bowel wall thickening, distention, or inflammatory changes. Vascular/Lymphatic: Aortic atherosclerosis. No enlarged  abdominal or pelvic lymph nodes. Reproductive: Mild prostatomegaly. Other: Tiny fat containing umbilical hernia. No abdominopelvic ascites. Musculoskeletal: Unchanged mild compression deformities of the thoracic and lumbar spine. No acute or significant osseous findings. IMPRESSION: 1. No acute findings in the chest, abdomen or pelvis. 2.  Aortic Atherosclerosis (ICD10-I70.0). Electronically Signed   By: Yetta Glassman M.D.   On: 04/22/2021 16:10   DG Chest Port 1 View  Result Date: 04/22/2021 CLINICAL DATA:  Questionable sepsis EXAM: PORTABLE CHEST 1 VIEW COMPARISON:  12/17/2020 FINDINGS: Normal mediastinum and cardiac silhouette. Normal pulmonary vasculature. No evidence of effusion, infiltrate, or pneumothorax. No acute bony abnormality. IMPRESSION: No acute cardiopulmonary process. Electronically Signed   By: Suzy Bouchard M.D.   On: 04/22/2021 13:40    EKG: Independently reviewed. Sinus Tachy  Assessment/Plan Principal Problem:   AMS (altered mental status) Active Problems:   Acute metabolic encephalopathy  (please populate well all problems here in Problem List. (For example, if patient is on BP meds at home and you resume or decide to hold them, it is a problem that needs to be her. Same for CAD, COPD, HLD and so on)  Acute metabolic encephalopathy, [id number] -Urology consulted to related to acute infection.  Currently, COVID test positive however lab reported that the RT-PCR was only weakly positive (at high cycle number), UA,  chest x-ray, CT chest abdomen pelvis negative for acute findings.  Physical exam, patient has a few skin lesions 1 on her left arm and also has a broken nail the right big toe, but none of this appears to be serous infection. -ED also proposed lumbar puncture, however family member including wife and one of the daughters declined. -For now, decided to continue broad-spectrum antibiotics and starting remdesivir, if patient condition/mentation improves, consider  de-escalate tomorrow. -Check procalcitonin trending. -MRI when patient able to cooperate. -Other Ddx, will check VBG and Ammonia level.  Wife reported that patient sensitive to Depakote can make him drowsy sometime, however review patient medical history list sent from the nursing home showed the patient has been on chronically.  Sepsis, POA -Evidenced by fever, leukocytosis and evidence of endorgan damage of mentation changes.  Management as above.  COVID 19 infection -As above.  IDDM with hyperglycemia -Sliding scale for now -NPO until mentation improves.  HTN, uncontrolled -PRN labetalol until patient can take p.o. meds.  Hx of stroke -MRI when able to    DVT prophylaxis: Lovenox Code Status: DNR Family Communication: Wife and daughter Disposition Plan: Patient is sick with fever to mentations, expect more than 2 midnight hospital stay. Consults called: Neurology Admission status: Tele admit   Lequita Halt MD [facility] Pager [phone]  04/22/2021, 6:09 PM

## 2021-04-22 NOTE — ED Notes (Signed)
Delbert Harness daughter [phone] requesting an update on the patient

## 2021-04-22 NOTE — ED Notes (Signed)
Pt's family updated.   Admitting notified that pt is unable to tolerate any PO Medications

## 2021-04-22 NOTE — Progress Notes (Signed)
Pharmacy Antibiotic Note  Jeffery Haynes is a 88 y.o. male admitted on 04/22/2021 with  sepsis .  Pharmacy has been consulted for cefepime and vancomycin dosing.   Patient febrile at 38.2C, WBC elevated at 14.3.   Plan: Vancomycin 1500 mg x1  Vancomycin 1250 mg q24 hours (eAUC=528, SCr used 0.86 mg/dL) Cefepime IV 2g q12h  Trend WBC, temp, renal function  F/U infectious work-up and narrowing of antibiotics Drug levels as indicated    Temp (24hrs), Avg:100.8 F (38.2 C), Min:100.8 F (38.2 C), Max:100.8 F (38.2 C)  No results for input(s): WBC, CREATININE, LATICACIDVEN, VANCOTROUGH, VANCOPEAK, VANCORANDOM, GENTTROUGH, GENTPEAK, GENTRANDOM, TOBRATROUGH, TOBRAPEAK, TOBRARND, AMIKACINPEAK, AMIKACINTROU, AMIKACIN in the last 168 hours.  CrCl cannot be calculated (Patient's most recent lab result is older than the maximum 21 days allowed.).    Allergies  Allergen Reactions   Olanzapine     Possible neuroleptic malignant syndrome. Pt has been tolerating at [facility] in 2022 for months.    Antimicrobials this admission: Cefepime 2/8 >>  Vancomcyin 2/8 >> Metronidazole x1  Microbiology results: 2/8 BCx: Pending  Thank you for allowing pharmacy to be a part of this patients care.  Joseph Art, Pharm.D. PGY-1 Pharmacy Resident [phone] 04/22/2021 1:51 PM

## 2021-04-22 NOTE — Consult Note (Addendum)
Neurology Consultation  Reason for Consult: AMS, left gaze  Referring Physician: Dr. Deretha Emory   CC: AMS  History is obtained from:chart   HPI: Jeffery Haynes is a 88 y.o. male with past medical history of Alzheimer Dementia, DM and prior stroke with residual left side weakness who presents from his facility for be noted to be nonverbal and AMS compared to baseline. Per chart review, able to speak in fragmented sentences and follow simple commands. CT head revealed no acute abnormality. Neurology consulted    LKW: unknown  tpa given?: no, Unknown LKW Premorbid modified Rankin scale (mRS):  5-Severe disability-bedridden, incontinent, needs constant attention  ROS: Full ROS was performed and is negative except as noted in the HPI.  Unable to obtain due to altered mental status.   Past Medical History:  Diagnosis Date   Alzheimer disease (HCC)    Dementia (HCC)    Diabetes mellitus without complication (HCC)    Stroke (HCC)     Family History  Problem Relation Age of Onset   Seizures Neg Hx      Social History:   reports that he has never smoked. He has never used smokeless tobacco. He reports that he does not currently use alcohol. He reports that he does not use drugs.  Medications  Current Facility-Administered Medications:    [START ON 04/23/2021] ceFEPIme (MAXIPIME) 2 g in sodium chloride 0.9 % 100 mL IVPB, 2 g, Intravenous, Q12H, Rushie Goltz, RPH   lactated ringers infusion, , Intravenous, Continuous, Dartha Lodge, [place], Last Rate: 150 mL/hr at 04/22/21 1329, New Bag at 04/22/21 1329   [START ON 04/23/2021] vancomycin (VANCOREADY) IVPB 1250 mg/250 mL, 1,250 mg, Intravenous, Q24H, Rushie Goltz, [facility]  Current Outpatient Medications:    acetaminophen (TYLENOL) 325 MG tablet, Take 650 mg by mouth 4 (four) times daily., Disp: , Rfl:    amLODipine (NORVASC) 5 MG tablet, Take 5 mg by mouth in the morning., Disp: , Rfl:    citalopram (CELEXA) 10 MG tablet, Take 15  mg by mouth in the morning., Disp: , Rfl:    divalproex (DEPAKOTE SPRINKLE) 125 MG capsule, Take 4 capsules (500 mg total) by mouth every 12 (twelve) hours., Disp: , Rfl:    donepezil (ARICEPT) 5 MG tablet, Take 1 tablet (5 mg total) by mouth at bedtime., Disp: , Rfl:    insulin aspart (NOVOLOG) 100 UNIT/ML injection, Inject 0-9 Units into the skin 3 (three) times daily with meals. Sliding scale insulin Less than 70 initiate hypoglycemia protocol 70-120  0 units 120-150 1 unit 151-200 2 units 201-250 3 units 251-300 5 units 301-350 7 units 351-400 9 units  Greater than 400 call MD, Disp: 10 mL, Rfl: 11   lidocaine (LIDODERM) 5 %, Place 1 patch onto the skin daily. Remove & Discard patch within 12 hours or as directed by MD (Patient taking differently: Place 1 patch onto the skin daily.), Disp: 30 patch, Rfl: 0   metFORMIN (GLUCOPHAGE) 500 MG tablet, Take 500 mg by mouth 2 (two) times daily., Disp: , Rfl:    OLANZapine (ZYPREXA) 2.5 MG tablet, Take 2.5 mg by mouth See admin instructions. At bedtime on Monday,Tuesday,Wednesday,Thursday and saturday, Disp: , Rfl:    polyethylene glycol powder (GLYCOLAX/MIRALAX) 17 GM/SCOOP powder, Take 17 g by mouth daily., Disp: , Rfl:    sennosides-docusate sodium (SENOKOT-S) 8.6-50 MG tablet, Take 2 tablets by mouth 2 (two) times daily., Disp: , Rfl:    tamsulosin (FLOMAX) 0.4 MG CAPS capsule, Take 0.4  mg by mouth at bedtime., Disp: , Rfl:    traZODone (DESYREL) 50 MG tablet, Take 1 tablet (50 mg total) by mouth at bedtime as needed for sleep., Disp: , Rfl:    Exam: Current vital signs: BP (!) 182/92 (BP Location: Right Arm)    Pulse 86    Temp 99.6 F (37.6 C) (Oral)    Resp 20    SpO2 98%  Vital signs in last 24 hours: Temp:  [99.6 F (37.6 C)-100.8 F (38.2 C)] 99.6 F (37.6 C) (02/08 1623) Pulse Rate:  [86-110] 86 (02/08 1623) Resp:  [16-28] 20 (02/08 1623) BP: (175-193)/(84-139) 182/92 (02/08 1623) SpO2:  [95 %-99 %] 98 % (02/08 1623)  GENERAL: frail  elderly male HEENT: - Normocephalic and atraumatic, dry mm LUNGS - non labored.  ABDOMEN - Soft, nontender, nondistended  Ext: warm, well perfused, intact peripheral pulses, no edema  NEURO:  Mental Status: Eyes open, no response to voice, does not follow commands, grimaces and withdraws to painful stimuli in all 4 extremities. Left arm/hand is contracted. Face appears symmetric Language: unable to assess. Visual fields appear to be intact to threat bilaterally, PERRL 82mm/brisk. Motor: withdraws in all 4 extremities to noxious stimuli Reduced bulk. Sl. Increased tone in ext. Sensation- Intact to light touch bilaterally Coordination: unable to assess  Gait- deferred    Labs I have reviewed labs in epic and the results pertinent to this consultation are:   CBC    Component Value Date/Time   WBC 14.3 (H) 04/22/2021 1308   RBC 4.70 04/22/2021 1308   HGB 13.7 04/22/2021 1308   HCT 42.3 04/22/2021 1308   PLT 283 04/22/2021 1308   MCV 90.0 04/22/2021 1308   MCH 29.1 04/22/2021 1308   MCHC 32.4 04/22/2021 1308   RDW 14.1 04/22/2021 1308   LYMPHSABS 0.9 04/22/2021 1308   MONOABS 0.7 04/22/2021 1308   EOSABS 0.0 04/22/2021 1308   BASOSABS 0.0 04/22/2021 1308    Imaging I have reviewed the images obtained:  CT-head No acute abnormality   CTA head/neck 1. 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 anterior cerebral artery infarct. This is unchanged from the prior CTA. 2. Moderate stenosis right M1 segment. Severe stenosis inferior division right middle cerebral artery, this appeared occluded previously and has recanalized in the interval. 3. Mild atherosclerotic disease of the carotid bifurcation bilaterally without significant stenosis 4. Moderate to severe stenosis at the origin of the vertebral artery bilaterally.  Assessment:  Jeffery Haynes is a 88 y.o. male with past medical history of Alzheimer Dementia, DM and prior stroke with residual left side  weakness who presents from his facility for be noted to be nonverbal and AMS compared to baseline. Per chart review, able to speak in fragmented sentences and follow simple commands. CT head revealed no acute abnormality He is COVID 19+ likely the reason for his AMS   -Leukocytosis 14.3, febrile t max 100.8  - Lactic acid 2.0 - UA negative, Urine Cx pending  - CXR negative  - COVID +- on RA 98%  Recommendations: - admit to medicine  - hydrate with IVF - check blood cultures  - continue Abx  -Trend Lactic acid  - LP if no improvement in mental status   Gevena Mart, DNP, ACNPC-AG   ATTENDING ATTESTATION:  AMS likely from infectious etiology.   Agree with above plan.  Discussed with ED PA.  Dr. Viviann Spare evaluated pt independently, changes were made where appropriate, reviewed imaging, chart, labs. Discussed  and formulated plan with the APP. Please see APP note above for details.      Jaquis Picklesimer,MD

## 2021-04-23 ENCOUNTER — Inpatient Hospital Stay (HOSPITAL_COMMUNITY): Payer: Medicare Other

## 2021-04-23 DIAGNOSIS — R6520 Severe sepsis without septic shock: Secondary | ICD-10-CM

## 2021-04-23 DIAGNOSIS — G309 Alzheimer's disease, unspecified: Secondary | ICD-10-CM

## 2021-04-23 DIAGNOSIS — A419 Sepsis, unspecified organism: Secondary | ICD-10-CM

## 2021-04-23 DIAGNOSIS — E119 Type 2 diabetes mellitus without complications: Secondary | ICD-10-CM

## 2021-04-23 DIAGNOSIS — G9341 Metabolic encephalopathy: Principal | ICD-10-CM

## 2021-04-23 DIAGNOSIS — F02C3 Dementia in other diseases classified elsewhere, severe, with mood disturbance: Secondary | ICD-10-CM

## 2021-04-23 DIAGNOSIS — I10 Essential (primary) hypertension: Secondary | ICD-10-CM

## 2021-04-23 DIAGNOSIS — R569 Unspecified convulsions: Secondary | ICD-10-CM

## 2021-04-23 LAB — GLUCOSE, CAPILLARY
Glucose-Capillary: 127 mg/dL — ABNORMAL HIGH (ref 70–99)
Glucose-Capillary: 102 mg/dL — ABNORMAL HIGH (ref 70–99)

## 2021-04-23 LAB — URINE CULTURE: Culture: NO GROWTH

## 2021-04-23 LAB — COMPREHENSIVE METABOLIC PANEL
ALT: 10 U/L (ref 0–44)
AST: 12 U/L — ABNORMAL LOW (ref 15–41)
Albumin: 3.1 g/dL — ABNORMAL LOW (ref 3.5–5.0)
Alkaline Phosphatase: 67 U/L (ref 38–126)
Anion gap: 9 (ref 5–15)
BUN: 17 mg/dL (ref 8–23)
CO2: 24 mmol/L (ref 22–32)
Calcium: 8.8 mg/dL — ABNORMAL LOW (ref 8.9–10.3)
Chloride: 100 mmol/L (ref 98–111)
Creatinine, Ser: 0.85 mg/dL (ref 0.61–1.24)
GFR, Estimated: >60 mL/min (ref >60)
Glucose, Bld: 170 mg/dL — ABNORMAL HIGH (ref 70–99)
Potassium: 4.4 mmol/L (ref 3.5–5.1)
Sodium: 133 mmol/L — ABNORMAL LOW (ref 135–145)
Total Bilirubin: 0.5 mg/dL (ref 0.3–1.2)
Total Protein: 6.1 g/dL — ABNORMAL LOW (ref 6.5–8.1)

## 2021-04-23 LAB — CBC WITH DIFFERENTIAL/PLATELET
Abs Immature Granulocytes: 0.1 10*3/uL — ABNORMAL HIGH (ref 0.00–0.07)
Basophils Absolute: 0 10*3/uL (ref 0.0–0.1)
Basophils Relative: 0 %
Eosinophils Absolute: 0 10*3/uL (ref 0.0–0.5)
Eosinophils Relative: 0 %
HCT: 38.2 % — ABNORMAL LOW (ref 39.0–52.0)
Hemoglobin: 12.3 g/dL — ABNORMAL LOW (ref 13.0–17.0)
Immature Granulocytes: 1 %
Lymphocytes Relative: 15 %
Lymphs Abs: 1.6 10*3/uL (ref 0.7–4.0)
MCH: 29.3 pg (ref 26.0–34.0)
MCHC: 32.2 g/dL (ref 30.0–36.0)
MCV: 91 fL (ref 80.0–100.0)
Monocytes Absolute: 0.9 10*3/uL (ref 0.1–1.0)
Monocytes Relative: 9 %
Neutro Abs: 8 10*3/uL — ABNORMAL HIGH (ref 1.7–7.7)
Neutrophils Relative %: 75 %
Platelets: 223 10*3/uL (ref 150–400)
RBC: 4.2 MIL/uL — ABNORMAL LOW (ref 4.22–5.81)
RDW: 14.5 % (ref 11.5–15.5)
WBC: 10.6 10*3/uL — ABNORMAL HIGH (ref 4.0–10.5)
nRBC: 0 % (ref 0.0–0.2)

## 2021-04-23 LAB — CBG MONITORING, ED
Glucose-Capillary: 182 mg/dL — ABNORMAL HIGH (ref 70–99)
Glucose-Capillary: 157 mg/dL — ABNORMAL HIGH (ref 70–99)
Glucose-Capillary: 112 mg/dL — ABNORMAL HIGH (ref 70–99)

## 2021-04-23 LAB — C-REACTIVE PROTEIN: CRP: 1.5 mg/dL — ABNORMAL HIGH (ref <1.0)

## 2021-04-23 LAB — FERRITIN: Ferritin: 59 ng/mL (ref 24–336)

## 2021-04-23 LAB — D-DIMER, QUANTITATIVE: D-Dimer, Quant: 0.34 ug/mL-FEU (ref 0.00–0.50)

## 2021-04-23 LAB — MAGNESIUM: Magnesium: 1.3 mg/dL — ABNORMAL LOW (ref 1.7–2.4)

## 2021-04-23 LAB — PROCALCITONIN: Procalcitonin: <0.1 ng/mL

## 2021-04-23 LAB — PHOSPHORUS: Phosphorus: 2.9 mg/dL (ref 2.5–4.6)

## 2021-04-23 IMAGING — MR MR HEAD W/O CM
7 of 12 series · 29 of 48 positions shown · non-contrast
Comparison: CT head [DATE].  MRI [DATE].

CLINICAL DATA: Mental status change, unknown cause

EXAM:
MRI HEAD WITHOUT CONTRAST
TECHNIQUE: Multiplanar, multiecho pulse sequences of the brain and surrounding
structures were obtained without intravenous contrast.

[Series 3: DWI · axial · 3.0mm · 0.94mm/px · z∈[-4,+139]mm · 9 of 100 slices shown (1 of 2)]
[im 1/100]
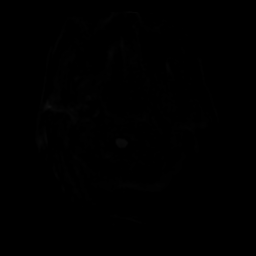
[im 13/100]
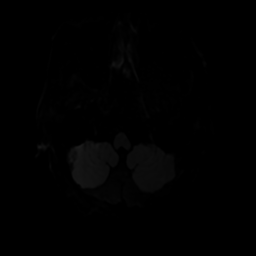
[im 25/100]
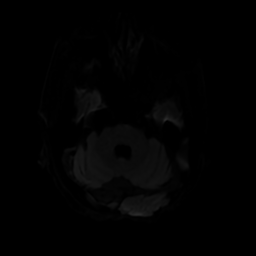
[im 38/100]
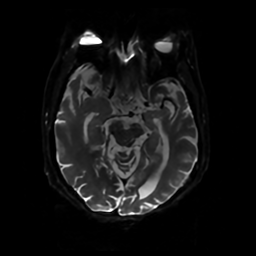
[im 50/100]
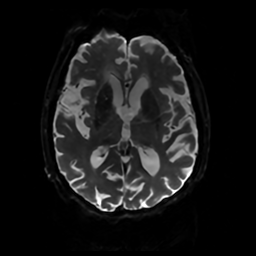
[im 62/100]
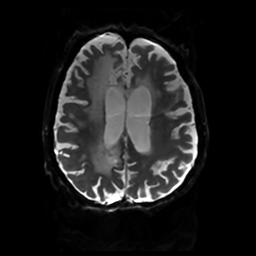
[im 75/100]
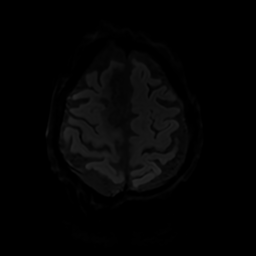
[im 87/100]
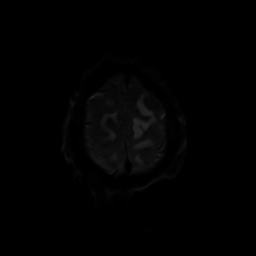
[im 100/100]
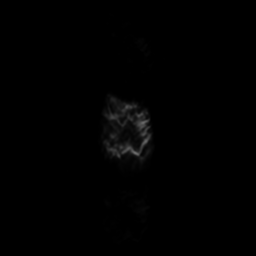

[Series 4: DWI · coronal · 4.0mm · 0.94mm/px · 6 of 78 slices shown (2 of 2)]
[im 1/78]
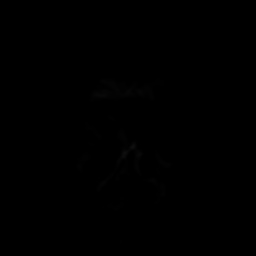
[im 16/78]
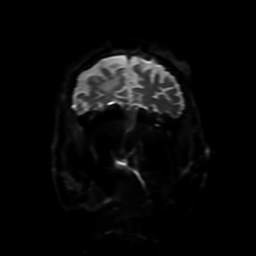
[im 31/78]
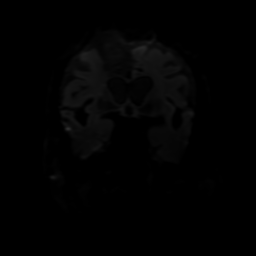
[im 47/78]
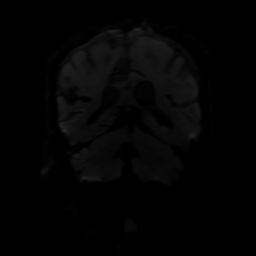
[im 62/78]
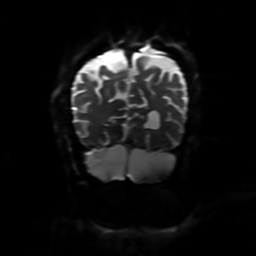
[im 78/78]
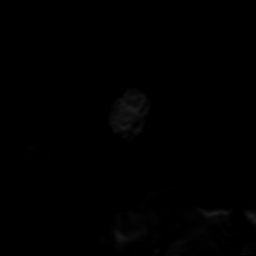

[Series 5: FLAIR · sagittal · 5.0mm · 0.23mm/px · 2 of 25 slices shown (1 of 3)]
[im 1/25]
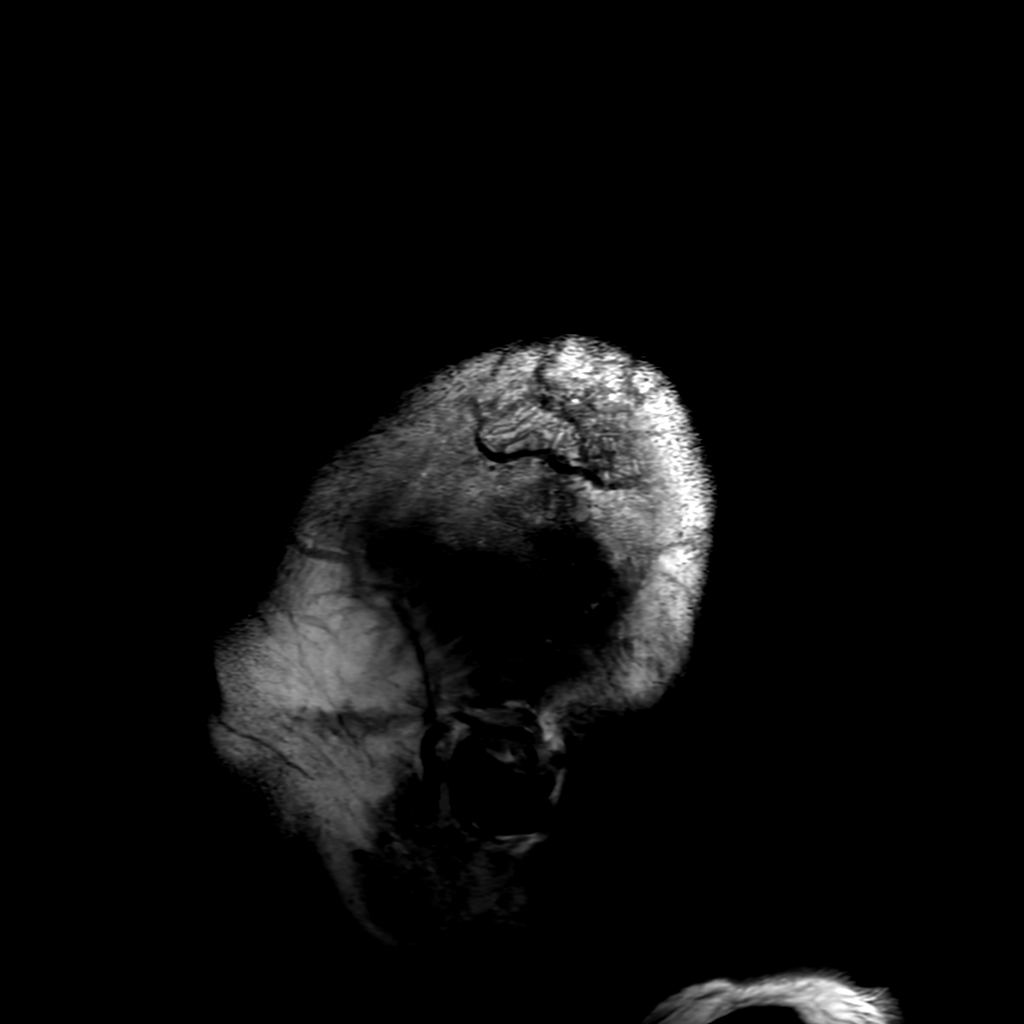
[im 25/25]
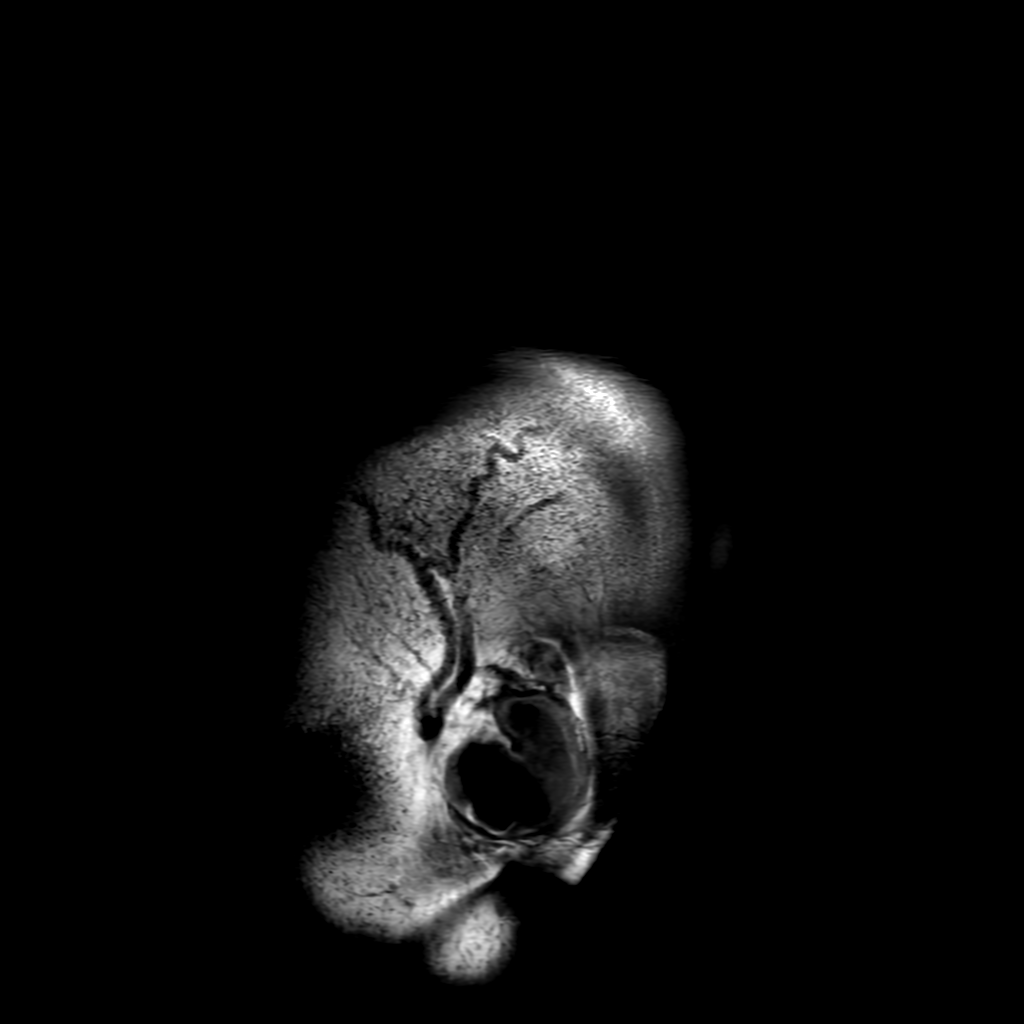

[Series 7: FLAIR · axial · 4.0mm · 0.45mm/px · z∈[-3,+141]mm · 3 of 35 slices shown (2 of 3)]
[im 1/35]
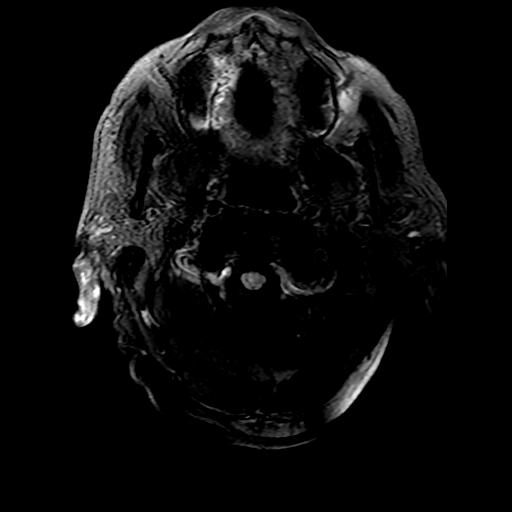
[im 18/35]
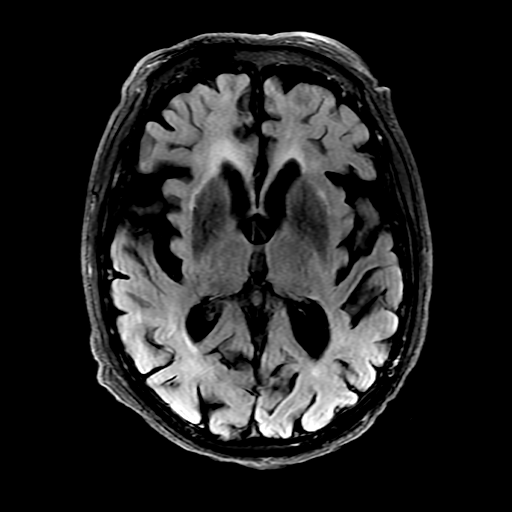
[im 35/35]
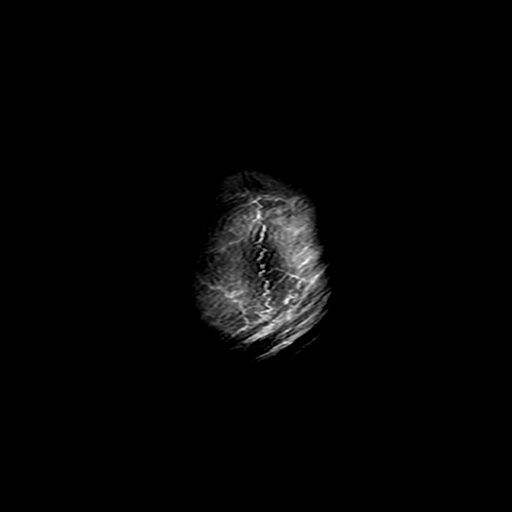

[Series 11: FLAIR · coronal · 3.0mm · 0.39mm/px · 2 of 30 slices shown (3 of 3)]
[im 1/30]
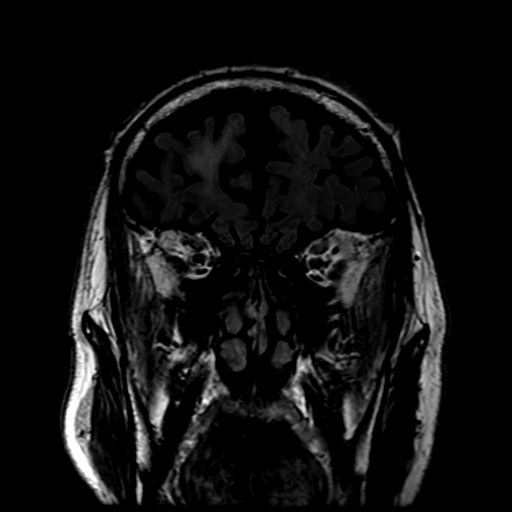
[im 30/30]
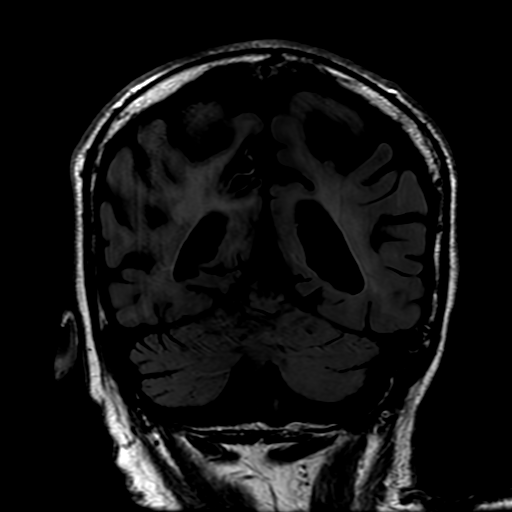

[Series 350: ADC · axial · 3.0mm · 0.94mm/px · z∈[-4,+139]mm · 4 of 50 slices shown (1 of 2)]
[im 1/50]
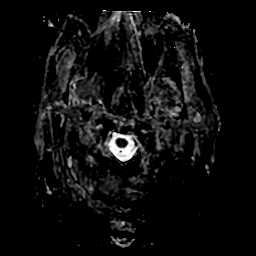
[im 17/50]
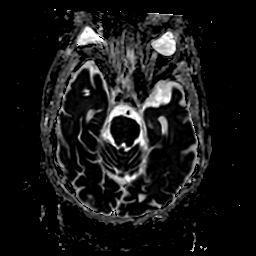
[im 33/50]
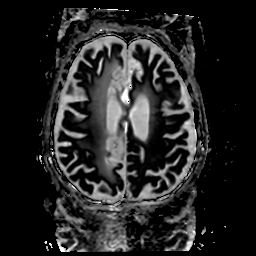
[im 50/50]
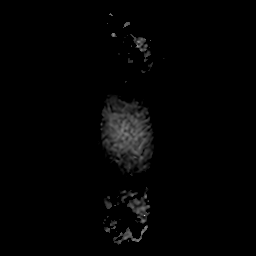

[Series 450: ADC · coronal · 4.0mm · 0.94mm/px · 3 of 39 slices shown (2 of 2)]
[im 1/39]
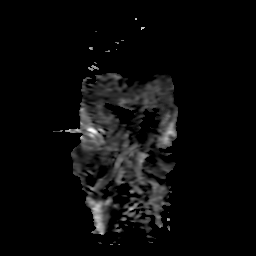
[im 20/39]
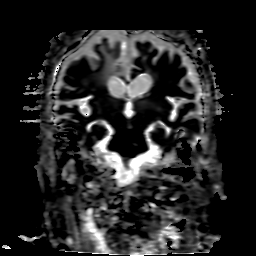
[im 39/39]
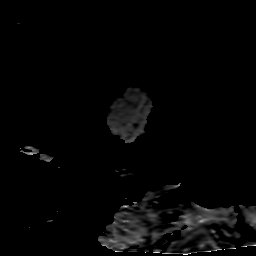

[29 of 48 positions shown; findings below may reference images not displayed]

FINDINGS: Brain: Large remote right ACA territory infarct with similar remote
blood products and encephalomalacia. Additional patchy T2/FLAIR
hyperintensity white matter, nonspecific compatible with chronic
microvascular ischemic disease. No evidence of acute infarct, acute
hemorrhage, mass lesion, midline shift, hydrocephalus, or
extra-axial fluid collection. Chronic mega cisterna magna. Chronic
microhemorrhage in the medial right temporal lobe. Moderate atrophy.

Vascular: Major arterial flow voids are maintained at the skull
base.

Skull and upper cervical spine: Normal marrow signal.

Sinuses/Orbits: Mild paranasal sinus mucosal thickening.
Unremarkable orbits.

Other: Small bilateral mastoid effusions.
IMPRESSION: 1. No evidence of acute abnormality.
2. Large remote right ACA territory infarct.
3. Moderate chronic microvascular disease and cerebral atrophy
([R2]-[R2]).

## 2021-04-23 MED ORDER — HYDRALAZINE HCL 20 MG/ML IJ SOLN
5.0000 mg | Freq: Four times a day (QID) | INTRAMUSCULAR | Status: DC | PRN
  Administered 2021-04-24: 5 mg via INTRAVENOUS
  Filled 2021-04-23: qty 1

## 2021-04-23 MED ORDER — MAGNESIUM SULFATE 2 GM/50ML IV SOLN
2.0000 g | Freq: Once | INTRAVENOUS | Status: AC
  Administered 2021-04-23: 2 g via INTRAVENOUS
  Filled 2021-04-23: qty 50

## 2021-04-23 MED ORDER — LACTATED RINGERS IV SOLN: INTRAVENOUS | Status: AC

## 2021-04-23 MED ORDER — VALPROATE SODIUM 100 MG/ML IV SOLN
500.0000 mg | Freq: Two times a day (BID) | INTRAVENOUS | Status: DC
  Administered 2021-04-23 – 2021-04-25 (×6): 500 mg via INTRAVENOUS
  Filled 2021-04-23 (×10): qty 5

## 2021-04-23 MED ORDER — LACTATED RINGERS IV SOLN: INTRAVENOUS | Status: DC

## 2021-04-23 NOTE — ED Notes (Signed)
Applied condom catheter due to incontinence. RN and EMT changed pads, gown, provided peri care. Pt resting in stretcher, no distress noted. Pt nonverbal, baseline left gaze noted. Call light in reach.

## 2021-04-23 NOTE — Evaluation (Signed)
Clinical/Bedside Swallow Evaluation Patient Details  Name: Jeffery Haynes MRN: [id number] Date of Birth: 11-May-1931  Today's Date: 04/23/2021 Time: SLP Start Time (ACUTE ONLY): 1100 SLP Stop Time (ACUTE ONLY): 1113 SLP Time Calculation (min) (ACUTE ONLY): 13 min  Past Medical History:  Past Medical History:  Diagnosis Date   Alzheimer disease ([place])    Dementia ([place])    Diabetes mellitus without complication ([place])    Stroke [facility])    Past Surgical History: History reviewed. No pertinent surgical history. HPI:  Pt is an 88 yo male presenting from SNF with AMS. COVID positive. CT Chest and MRI Brain without acute findings. Previous clinical swallowing evaluations in March and October 2022 both revealed relatively functional swallowing despite cognitive deficits with regular solids and thin liquids recommended (meds crushed in puree per family). PMH includes: advanced dementia, stroke with residual L sided weakness, DM, HTN, BPH    Assessment / Plan / Recommendation  Clinical Impression  Pt does not have a h/o dysphagia despite chronic cognitive impairments; however, with current mentation he also does not open his mouth at all to presentation of boluses. SLP offered multiple consistencies via various bolus delivery methods, and attempted to help pt with self-feeding. Question if he got a very small amount of water off a spoon x1, but resultant coughing was noted either on water or perhaps on secretions. SLP will f/u to see if swallowing can be better evaluated if mentation improves. SLP Visit Diagnosis: Dysphagia, unspecified (R13.10)    Aspiration Risk  Moderate aspiration risk;Risk for inadequate nutrition/hydration    Diet Recommendation NPO   Medication Administration: Via alternative means    Other  Recommendations Oral Care Recommendations: Oral care QID    Recommendations for follow up therapy are one component of a multi-disciplinary discharge planning process, led by the  attending physician.  Recommendations may be updated based on patient status, additional functional criteria and insurance authorization.  Follow up Recommendations Skilled nursing-short term rehab (<3 hours/day)      Assistance Recommended at Discharge    Functional Status Assessment Patient has had a recent decline in their functional status and demonstrates the ability to make significant improvements in function in a reasonable and predictable amount of time.  Frequency and Duration min 2x/week  2 weeks       Prognosis Prognosis for Safe Diet Advancement: Fair Barriers to Reach Goals: Cognitive deficits      Swallow Study   General HPI: Pt is an 88 yo male presenting from SNF with AMS. COVID positive. CT Chest and MRI Brain without acute findings. Previous clinical swallowing evaluations in March and October 2022 both revealed relatively functional swallowing despite cognitive deficits with regular solids and thin liquids recommended (meds crushed in puree per family). PMH includes: advanced dementia, stroke with residual L sided weakness, DM, HTN, BPH Type of Study: Bedside Swallow Evaluation Previous Swallow Assessment: see HPI Diet Prior to this Study: NPO Temperature Spikes Noted: Yes (100.8) Respiratory Status: Room air History of Recent Intubation: No Behavior/Cognition: Alert;Doesn't follow directions Oral Cavity Assessment:  (UTA) Oral Care Completed by SLP: No Oral Cavity - Dentition:  (UTA) Self-Feeding Abilities: Total assist Patient Positioning: Upright in bed Baseline Vocal Quality: Not observed Volitional Cough: Cognitively unable to elicit Volitional Swallow: Unable to elicit    Oral/Motor/Sensory Function Overall Oral Motor/Sensory Function:  (UTA)   Ice Chips     Thin Liquid Thin Liquid: Impaired Presentation: Cup;Spoon;Straw Oral Phase Impairments: Poor awareness of bolus;Other (comment) (no acceptance)  Nectar Thick Nectar Thick Liquid: Not tested    Honey Thick Honey Thick Liquid: Not tested   Puree Puree: Impaired Presentation: Spoon Oral Phase Impairments: Poor awareness of bolus;Other (comment) (no acceptance)   Solid     Solid: Not tested      Osie Bond., M.A. [place] [facility] Pager [phone] Office [phone]  04/23/2021,11:24 AM

## 2021-04-23 NOTE — Progress Notes (Signed)
°  Transition of Care [facility]) Screening Note   Patient Details  Name: Usama Boshers Date of Birth: April 16, 1931   Transition of Care [facility]) CM/SW Contact:    Geralynn Ochs, LCSW Phone Number: 04/23/2021, 3:53 PM    Transition of Care Department [facility]) has reviewed patient and no TOC needs have been identified at this time. We will continue to monitor patient advancement through interdisciplinary progression rounds. If new patient transition needs arise, please place a TOC consult.

## 2021-04-23 NOTE — Assessment & Plan Note (Signed)
Elevated BP parameters. Prn hydralazine ordered.

## 2021-04-23 NOTE — Progress Notes (Signed)
[facility]                                                                               Jeffery Haynes, is a 88 y.o. male, DOB - 08-05-1931, [id number] Admit date - 04/22/2021    Outpatient Primary MD for the patient is Garwin Brothers, MD  LOS - 1  days    Brief summary   Jeffery Haynes is a 88 y.o. male with medical history significant of advanced dementia, IDDM with left sided residual weakness, HTN, BPH, who was sent from nursing home for altered mentation. Found febrile temperature 100.9, tachycardia, blood pressure elevated, no hypoxia.  WBC 14.3, COVID positive and CT chest abdomen pelvis showed acute no acute findings.  UA showed no nitrite no leukocyte. Acid 2.0.  ED discussed with patient and family regarding lumbar puncture, family declined.  Neurology was consulted who ordered MRI.  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  Moderate stenosis of right M1 segment and severe stenosis inferior division right MCA  Assessment & Plan    Assessment and Plan: Sepsis ([place])- (present on admission) Sepsis ruled out.  Pt was febrile, leukocytosis and AMS. He was started on broad spectrum IV Antibiotics, follow cultures.   Acute metabolic encephalopathy- (present on admission) Suspect secondary to COVID 19 infection/ decreased po intake. On exam, he is alert, but not following commands.  Pt has right sided weakness and deficits. Continue with remdesevir.  Hypomagnesemia Replaced, repeat in am.   Seizure ([place]) None this admission. Transitioned to IV valproic acid as pt is NPO.   Diabetes mellitus type 2 in nonobese (HCC) Hemoglobin A1c is 7.4%  Currently on SSI.  CBG (last 3)  Recent Labs    04/23/21 0228 04/23/21 0800 04/23/21 1148  GLUCAP 182* 157* 112*     Essential hypertension- (present on admission) Elevated BP parameters. Prn hydralazine ordered.   Alzheimer's dementia ([place])- (present on  admission) No agitation.  Dementia ([place])- (present on admission) Not agitated.  Resume home meds once able to take oral.     Estimated body mass index is 19.43 kg/m as calculated from the following:   Height as of 12/27/20: 6' (1.829 m).   Weight as of 12/27/20: 65 kg.  Code Status: DNR DVT Prophylaxis:  heparin injection 5,000 Units Start: 04/22/21 2200   Level of Care: Level of care: Telemetry Medical Family Communication: none at bedside.   Disposition Plan:     Remains inpatient appropriate:  AMS  Procedures:  MRI brain   Consultants:   neurology  Antimicrobials:  Antibiotics Given (last 72 hours)    Date/Time Action Medication Dose Rate   04/22/21 1327 New Bag/Given   ceFEPIme (MAXIPIME) 2 g in sodium chloride 0.9 % 100 mL IVPB 2 g 200 mL/hr   04/22/21 1328 New Bag/Given   metroNIDAZOLE (FLAGYL) IVPB 500 mg 500 mg 100 mL/hr   04/22/21 1409 New Bag/Given   vancomycin (VANCOREADY) IVPB 1500 mg/300 mL 1,500 mg 150 mL/hr   04/22/21 2032 New Bag/Given   remdesivir 200 mg in sodium chloride 0.9% 250 mL IVPB 200 mg 580 mL/hr   04/23/21 0051 New Bag/Given  ceFEPIme (MAXIPIME) 2 g in sodium chloride 0.9 % 100 mL IVPB 2 g 200 mL/hr   04/23/21 1003 New Bag/Given   remdesivir 100 mg in sodium chloride 0.9 % 100 mL IVPB 100 mg 200 mL/hr   04/23/21 1007 New Bag/Given   ceFEPIme (MAXIPIME) 2 g in sodium chloride 0.9 % 100 mL IVPB 2 g 200 mL/hr      Medications  Scheduled Meds:  citalopram  15 mg Oral q AM   donepezil  5 mg Oral QHS   heparin  5,000 Units Subcutaneous Q12H   insulin aspart  0-15 Units Subcutaneous TID WC   polyethylene glycol  17 g Oral Daily   senna-docusate  2 tablet Oral BID   tamsulosin  0.4 mg Oral QHS   Continuous Infusions:  ceFEPime (MAXIPIME) IV Stopped (04/23/21 1037)   lactated ringers 100 mL/hr at 04/23/21 1057   magnesium sulfate bolus IVPB     remdesivir 100 mg in NS 100 mL Stopped (04/23/21 1033)   valproate sodium  Stopped (04/23/21 1245)   vancomycin     PRN Meds:.acetaminophen, hydrALAZINE, labetalol, traZODone    Subjective:   Jeffery Haynes was seen and examined today. Pt nonverbal.   Objective:   Vitals:   04/23/21 0745 04/23/21 0800 04/23/21 1010 04/23/21 1146  BP: (!) 159/75 (!) 168/81 (!) 177/92 (!) 174/92  Pulse: (!) 58 64 66 64  Resp: 19 20 15 18   Temp:    99.1 F (37.3 C)  TempSrc:    Rectal  SpO2: 98% 99% 98% 98%    Intake/Output Summary (Last 24 hours) at 04/23/2021 1349 Last data filed at 04/23/2021 1037 Gross per 24 hour  Intake 2320.19 ml  Output --  Net 2320.19 ml   There were no vitals filed for this visit.   Exam General exam: Appears calm and comfortable  Respiratory system: Clear to auscultation. Respiratory effort normal. Cardiovascular system: S1 & S2 heard, RRR. No JVD, murmurs, rubs, gallops or clicks. No pedal edema. Gastrointestinal system: Abdomen is nondistended, soft and nontender. No organomegaly or masses felt. Normal bowel sounds heard. Central nervous system: Alert and oriented. No focal neurological deficits. Extremities: Symmetric 5 x 5 power. Skin: No rashes, lesions or ulcers  Psychiatry:cannot be assessed.     Data Reviewed:  I have personally reviewed following labs and imaging studies   CBC Lab Results  Component Value Date   WBC 10.6 (H) 04/23/2021   RBC 4.20 (L) 04/23/2021   HGB 12.3 (L) 04/23/2021   HCT 38.2 (L) 04/23/2021   MCV 91.0 04/23/2021   MCH 29.3 04/23/2021   PLT 223 04/23/2021   MCHC 32.2 04/23/2021   RDW 14.5 04/23/2021   LYMPHSABS 1.6 04/23/2021   MONOABS 0.9 04/23/2021   EOSABS 0.0 04/23/2021   BASOSABS 0.0 [id number]     Last metabolic panel Lab Results  Component Value Date   NA 133 (L) 04/23/2021   K 4.4 04/23/2021   CL 100 04/23/2021   CO2 24 04/23/2021   BUN 17 04/23/2021   CREATININE 0.85 04/23/2021   GLUCOSE 170 (H) 04/23/2021   GFRNONAA >60 04/23/2021   GFRAA >60 09/28/2019   CALCIUM  8.8 (L) 04/23/2021   PHOS 2.9 04/23/2021   PROT 6.1 (L) 04/23/2021   ALBUMIN 3.1 (L) 04/23/2021   BILITOT 0.5 04/23/2021   ALKPHOS 67 04/23/2021   AST 12 (L) 04/23/2021   ALT 10 04/23/2021   ANIONGAP 9 04/23/2021    CBG (last 3)  Recent Labs  04/23/21 0228 04/23/21 0800 04/23/21 1148  GLUCAP 182* 157* 112*      Coagulation Profile: Recent Labs  Lab 04/22/21 1415  INR 1.0     Radiology Studies: CT ANGIO HEAD NECK W WO CM  Result Date: 04/22/2021 CLINICAL DATA:  Acute neuro deficit.  Suspect stroke. EXAM: CT ANGIOGRAPHY HEAD AND NECK TECHNIQUE: Multidetector CT imaging of the head and neck was performed using the standard protocol during bolus administration of intravenous contrast. Multiplanar CT image reconstructions and MIPs were obtained to evaluate the vascular anatomy. Carotid stenosis measurements (when applicable) are obtained utilizing NASCET criteria, using the distal internal carotid diameter as the denominator. RADIATION DOSE REDUCTION: This exam was performed according to the departmental dose-optimization program which includes automated exposure control, adjustment of the mA and/or kV according to patient size and/or use of iterative reconstruction technique. CONTRAST:  140mL OMNIPAQUE IOHEXOL 350 MG/ML SOLN COMPARISON:  CT head 04/22/2021.  CT angio head and neck 12/12/2020 FINDINGS: CTA NECK FINDINGS Aortic arch: Standard branching. Imaged portion shows no evidence of aneurysm or dissection. No significant stenosis of the major arch vessel origins. Mild atherosclerotic disease aortic arch. Right carotid system: Mild atherosclerotic calcification right carotid bulb. Negative for right carotid stenosis Left carotid system: Mild atherosclerotic calcification left carotid bulb. Negative for stenosis Vertebral arteries: Moderate to severe stenosis at the origin of the vertebral artery bilaterally due to calcific plaque. Both vertebral arteries are then patent to the  basilar. Skeleton: Multilevel cervical disc degeneration. No acute skeletal abnormality. Other neck: Negative for mass or adenopathy. Upper chest: Lung apices clear bilaterally. Review of the MIP images confirms the above findings CTA HEAD FINDINGS Anterior circulation: Atherosclerotic calcification throughout the cavernous carotid bilaterally causing mild to moderate stenosis bilaterally. 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 anterior cerebral artery infarct. Moderate stenosis proximal right M1 segment. Severe stenosis inferior division of right MCA. Mild disease in the superior division of the right MCA. Left middle cerebral artery patent with scattered mild atherosclerotic disease. Posterior circulation: Both vertebral arteries patent to the basilar. PICA patent on the left. Right PICA appears supplied from the right AICA. Basilar widely patent. Superior cerebellar and posterior cerebral arteries patent bilaterally. Mild atherosclerotic disease in the posterior cerebral artery bilaterally. Venous sinuses: Normal venous enhancement Anatomic variants: None Review of the MIP images confirms the above findings IMPRESSION: 1. 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 anterior cerebral artery infarct. This is unchanged from the prior CTA. 2. Moderate stenosis right M1 segment. Severe stenosis inferior division right middle cerebral artery, this appeared occluded previously and has recanalized in the interval. 3. Mild atherosclerotic disease of the carotid bifurcation bilaterally without significant stenosis 4. Moderate to severe stenosis at the origin of the vertebral artery bilaterally. Electronically Signed   By: Franchot Gallo M.D.   On: 04/22/2021 16:16   CT HEAD WO CONTRAST (5MM)  Result Date: 04/22/2021 CLINICAL DATA:  Altered mental status. EXAM: CT HEAD WITHOUT CONTRAST TECHNIQUE: Contiguous axial images were obtained from the base of the skull through the vertex without  intravenous contrast. RADIATION DOSE REDUCTION: This exam was performed according to the departmental dose-optimization program which includes automated exposure control, adjustment of the mA and/or kV according to patient size and/or use of iterative reconstruction technique. COMPARISON:  March 02, 2021. FINDINGS: Brain: Right parietal encephalomalacia is noted. Mild diffuse cortical atrophy is noted. Mild chronic ischemic white matter disease is noted. No mass effect or midline shift is noted. Ventricular size is within  normal limits. There is no evidence of mass lesion, hemorrhage or acute infarction. Vascular: No hyperdense vessel or unexpected calcification. Skull: Normal. Negative for fracture or focal lesion. Sinuses/Orbits: No acute finding. Other: None. IMPRESSION: No acute intracranial abnormality seen. Electronically Signed   By: Lupita Raider M.D.   On: 04/22/2021 14:37   MR BRAIN WO CONTRAST  Result Date: 04/23/2021 CLINICAL DATA:  Mental status change, unknown cause EXAM: MRI HEAD WITHOUT CONTRAST TECHNIQUE: Multiplanar, multiecho pulse sequences of the brain and surrounding structures were obtained without intravenous contrast. COMPARISON:  CT head April 22, 2021.  MRI December 13, 2020. FINDINGS: Brain: Large remote right ACA territory infarct with similar remote blood products and encephalomalacia. Additional patchy T2/FLAIR hyperintensity white matter, nonspecific compatible with chronic microvascular ischemic disease. No evidence of acute infarct, acute hemorrhage, mass lesion, midline shift, hydrocephalus, or extra-axial fluid collection. Chronic mega cisterna magna. Chronic microhemorrhage in the medial right temporal lobe. Moderate atrophy. Vascular: Major arterial flow voids are maintained at the skull base. Skull and upper cervical spine: Normal marrow signal. Sinuses/Orbits: Mild paranasal sinus mucosal thickening. Unremarkable orbits. Other: Small bilateral mastoid effusions.  IMPRESSION: 1. No evidence of acute abnormality. 2. Large remote right ACA territory infarct. 3. Moderate chronic microvascular disease and cerebral atrophy (ICD10-G31.9). Electronically Signed   By: Feliberto Harts M.D.   On: 04/23/2021 09:29   CT CHEST ABDOMEN PELVIS W CONTRAST  Result Date: 04/22/2021 CLINICAL DATA:  Sepsis EXAM: CT CHEST, ABDOMEN, AND PELVIS WITH CONTRAST TECHNIQUE: Multidetector CT imaging of the chest, abdomen and pelvis was performed following the standard protocol during bolus administration of intravenous contrast. RADIATION DOSE REDUCTION: This exam was performed according to the departmental dose-optimization program which includes automated exposure control, adjustment of the mA and/or kV according to patient size and/or use of iterative reconstruction technique. CONTRAST:  OMNIPAQUE IOHEXOL 350 MG/ML SOLN COMPARISON:  None. FINDINGS: CT CHEST FINDINGS Cardiovascular: Normal heart size. No pericardial effusion. Left main and three-vessel coronary artery calcifications. Atherosclerotic disease of the thoracic aorta. Mediastinum/Nodes: Patulous esophagus. Thyroid is unremarkable. No pathologically enlarged lymph nodes seen in the chest. Lungs/Pleura: Central airways are patent. Linear opacities of the bilateral lower lobes which are likely due to scarring or atelectasis. No consolidation, pleural effusion or pneumothorax. Musculoskeletal: Old posterior left-sided rib fractures. No chest wall mass or suspicious bone lesions identified. CT ABDOMEN PELVIS FINDINGS Hepatobiliary: No focal liver abnormality is seen. No gallstones, gallbladder wall thickening, or biliary dilatation. Pancreas: Unremarkable. No pancreatic ductal dilatation or surrounding inflammatory changes. Spleen: Normal in size without focal abnormality. Adrenals/Urinary Tract: No hydronephrosis or nephrolithiasis. Renal vascular calcifications with no definite nephrolithiasis. Bladder wall thickening.  Stomach/Bowel: Stomach is within normal limits. Appendix appears normal. Diverticulosis. No evidence of bowel wall thickening, distention, or inflammatory changes. Vascular/Lymphatic: Aortic atherosclerosis. No enlarged abdominal or pelvic lymph nodes. Reproductive: Mild prostatomegaly. Other: Tiny fat containing umbilical hernia. No abdominopelvic ascites. Musculoskeletal: Unchanged mild compression deformities of the thoracic and lumbar spine. No acute or significant osseous findings. IMPRESSION: 1. No acute findings in the chest, abdomen or pelvis. 2.  Aortic Atherosclerosis (ICD10-I70.0). Electronically Signed   By: Allegra Lai M.D.   On: 04/22/2021 16:10   DG Chest Port 1 View  Result Date: 04/22/2021 CLINICAL DATA:  Questionable sepsis EXAM: PORTABLE CHEST 1 VIEW COMPARISON:  12/17/2020 FINDINGS: Normal mediastinum and cardiac silhouette. Normal pulmonary vasculature. No evidence of effusion, infiltrate, or pneumothorax. No acute bony abnormality. IMPRESSION: No acute cardiopulmonary process. Electronically Signed  By: Suzy Bouchard M.D.   On: 04/22/2021 13:40   EEG adult  Result Date: 04/23/2021 Mickie Hillier, MD     04/23/2021  8:42 AM Telespecialist EEG Report  Routine EEG Date: 04/22/21 Read 04/23/21 Duration 22:02 Clinical Indication: encephalopathy r/o seizures EEG Procedure:  This is a digitally recorded ROUTINE electroencephalogram. The international 10-20 electrode placement system is used for scalp electrode placement. Eighteen channels of scalp EEG are recorded. The data are stored digitally and reviewed in reformatted montages for optimal display.  EEG Description: This EEG is not well organized.  There is no clear posterior dominant rhythm.  The background consists of a mixture of a disorganized mixture of delta, theta, intermixed with faster frequencies.  There is no discreet sleep architecture.  There are no focal abnormalities, persistent asymmetries or epileptiform discharges.  Activating procedures:  Photic stimulation was not performed.  Hyperventilation was not performed. EKG:  The EKG rhythm strip demonstrated a NSR at 78 bpm. EEG Classification:  Abnormal Background slowing, generalized EEG Interpretation: This routine EEG recorded in the comatose state is abnormal.  The background slowing suggest moderate to severe diffuse and/or multifocal cerebral dysfunction.  There are no focal abnormalities, persistent asymmetries or epileptiform discharges.       Hosie Poisson M.D. [facility] 04/23/2021, 1:49 PM  Available via Epic secure chat 7am-7pm After 7 pm, please refer to night coverage provider listed on amion.

## 2021-04-23 NOTE — Assessment & Plan Note (Signed)
Replaced, repeat in am.

## 2021-04-23 NOTE — ED Notes (Signed)
RN sent secure chat to Dr Blake Divine re: poor mentation and inability to participate/follow instructions to perform PO challenge. Unsafe to attempt PO meds at this time, MD aware. Pt in MRI now. RN requested potential speech therapy consult after MRI results to assess if PO is appropriate. RN asked if IV seizure medication would be appropriate as pt unable to take PO depakote for hx seizures.

## 2021-04-23 NOTE — Procedures (Signed)
Telespecialist EEG Report   Routine EEG  Date: 04/22/21 Read 04/23/21  Duration 22:02  Clinical Indication: encephalopathy r/o seizures  EEG Procedure:  This is a digitally recorded ROUTINE electroencephalogram. The international 10-20 electrode placement system is used for scalp electrode placement. Eighteen channels of scalp EEG are recorded. The data are stored digitally and reviewed in reformatted montages for optimal display.   EEG Description: This EEG is not well organized.  There is no clear posterior dominant rhythm.  The background consists of a mixture of a disorganized mixture of delta, theta, intermixed with faster frequencies.  There is no discreet sleep architecture.  There are no focal abnormalities, persistent asymmetries or epileptiform discharges.  Activating procedures:  Photic stimulation was not performed.  Hyperventilation was not performed.   EKG:  The EKG rhythm strip demonstrated a NSR at 78 bpm.  EEG Classification:  Abnormal Background slowing, generalized  EEG Interpretation:  This routine EEG recorded in the comatose state is abnormal.  The background slowing suggest moderate to severe diffuse and/or multifocal cerebral dysfunction.  There are no focal abnormalities, persistent asymmetries or epileptiform discharges.

## 2021-04-23 NOTE — ED Notes (Signed)
Jacorie Ernsberger wife [phone] requesting an update

## 2021-04-23 NOTE — Assessment & Plan Note (Signed)
No agitation.

## 2021-04-23 NOTE — Assessment & Plan Note (Signed)
Sepsis ruled out.  Pt was febrile, leukocytosis and AMS. He was started on broad spectrum IV Antibiotics, follow cultures.

## 2021-04-23 NOTE — Progress Notes (Signed)
Patient arrived to unit. Responsive to pain. Vitals stable. Bed in low position, alarm activated.   Melony Overly, RN

## 2021-04-23 NOTE — ED Notes (Signed)
Jeffery Haynes (Spouse) called asking for an update in [name]. [phone]

## 2021-04-23 NOTE — ED Notes (Signed)
Patient transported to MRI 

## 2021-04-23 NOTE — Assessment & Plan Note (Addendum)
Suspect secondary to COVID 19 infection/ decreased po intake. On exam, he is alert, but not following commands.  Pt has right sided weakness and deficits. Continue with remdesevir.

## 2021-04-23 NOTE — Assessment & Plan Note (Signed)
None this admission. Transitioned to IV valproic acid as pt is NPO.

## 2021-04-23 NOTE — Assessment & Plan Note (Signed)
Not agitated.  Resume home meds once able to take oral.

## 2021-04-23 NOTE — Assessment & Plan Note (Signed)
Hemoglobin A1c is 7.4%  Currently on SSI.  CBG (last 3)  Recent Labs    04/23/21 0228 04/23/21 0800 04/23/21 1148  GLUCAP 182* 157* 112*

## 2021-04-24 DIAGNOSIS — Z515 Encounter for palliative care: Secondary | ICD-10-CM

## 2021-04-24 DIAGNOSIS — Z66 Do not resuscitate: Secondary | ICD-10-CM

## 2021-04-24 LAB — CBC WITH DIFFERENTIAL/PLATELET
Abs Immature Granulocytes: 0.12 10*3/uL — ABNORMAL HIGH (ref 0.00–0.07)
Basophils Absolute: 0 10*3/uL (ref 0.0–0.1)
Basophils Relative: 0 %
Eosinophils Absolute: 0 10*3/uL (ref 0.0–0.5)
Eosinophils Relative: 0 %
HCT: 37.6 % — ABNORMAL LOW (ref 39.0–52.0)
Hemoglobin: 12.5 g/dL — ABNORMAL LOW (ref 13.0–17.0)
Immature Granulocytes: 1 %
Lymphocytes Relative: 17 %
Lymphs Abs: 1.5 10*3/uL (ref 0.7–4.0)
MCH: 29.3 pg (ref 26.0–34.0)
MCHC: 33.2 g/dL (ref 30.0–36.0)
MCV: 88.3 fL (ref 80.0–100.0)
Monocytes Absolute: 0.5 10*3/uL (ref 0.1–1.0)
Monocytes Relative: 6 %
Neutro Abs: 6.6 10*3/uL (ref 1.7–7.7)
Neutrophils Relative %: 76 %
Platelets: 227 10*3/uL (ref 150–400)
RBC: 4.26 MIL/uL (ref 4.22–5.81)
RDW: 14.2 % (ref 11.5–15.5)
WBC: 8.8 10*3/uL (ref 4.0–10.5)
nRBC: 0 % (ref 0.0–0.2)

## 2021-04-24 LAB — COMPREHENSIVE METABOLIC PANEL
ALT: 10 U/L (ref 0–44)
AST: 13 U/L — ABNORMAL LOW (ref 15–41)
Albumin: 3 g/dL — ABNORMAL LOW (ref 3.5–5.0)
Alkaline Phosphatase: 63 U/L (ref 38–126)
Anion gap: 11 (ref 5–15)
BUN: 19 mg/dL (ref 8–23)
CO2: 23 mmol/L (ref 22–32)
Calcium: 8.7 mg/dL — ABNORMAL LOW (ref 8.9–10.3)
Chloride: 97 mmol/L — ABNORMAL LOW (ref 98–111)
Creatinine, Ser: 0.86 mg/dL (ref 0.61–1.24)
GFR, Estimated: >60 mL/min (ref >60)
Glucose, Bld: 148 mg/dL — ABNORMAL HIGH (ref 70–99)
Potassium: 4 mmol/L (ref 3.5–5.1)
Sodium: 131 mmol/L — ABNORMAL LOW (ref 135–145)
Total Bilirubin: 0.8 mg/dL (ref 0.3–1.2)
Total Protein: 5.9 g/dL — ABNORMAL LOW (ref 6.5–8.1)

## 2021-04-24 LAB — PHOSPHORUS: Phosphorus: 3.3 mg/dL (ref 2.5–4.6)

## 2021-04-24 LAB — RESP PANEL BY RT-PCR (FLU A&B, COVID) ARPGX2
Influenza A by PCR: NEGATIVE
Influenza B by PCR: NEGATIVE
SARS Coronavirus 2 by RT PCR: NEGATIVE

## 2021-04-24 LAB — MRSA NEXT GEN BY PCR, NASAL: MRSA by PCR Next Gen: NOT DETECTED

## 2021-04-24 LAB — GLUCOSE, CAPILLARY
Glucose-Capillary: 135 mg/dL — ABNORMAL HIGH (ref 70–99)
Glucose-Capillary: 144 mg/dL — ABNORMAL HIGH (ref 70–99)
Glucose-Capillary: 126 mg/dL — ABNORMAL HIGH (ref 70–99)
Glucose-Capillary: 110 mg/dL — ABNORMAL HIGH (ref 70–99)

## 2021-04-24 LAB — C-REACTIVE PROTEIN: CRP: 0.6 mg/dL (ref <1.0)

## 2021-04-24 LAB — PROCALCITONIN: Procalcitonin: <0.1 ng/mL

## 2021-04-24 LAB — D-DIMER, QUANTITATIVE: D-Dimer, Quant: 0.36 ug/mL-FEU (ref 0.00–0.50)

## 2021-04-24 LAB — FERRITIN: Ferritin: 78 ng/mL (ref 24–336)

## 2021-04-24 LAB — MAGNESIUM: Magnesium: 1.6 mg/dL — ABNORMAL LOW (ref 1.7–2.4)

## 2021-04-24 MED ORDER — HYDRALAZINE HCL 20 MG/ML IJ SOLN
10.0000 mg | Freq: Four times a day (QID) | INTRAMUSCULAR | Status: DC | PRN
  Administered 2021-04-28: 10 mg via INTRAVENOUS
  Filled 2021-04-24: qty 1

## 2021-04-24 MED ORDER — HALOPERIDOL LACTATE 5 MG/ML IJ SOLN
1.0000 mg | Freq: Four times a day (QID) | INTRAMUSCULAR | Status: DC | PRN
  Administered 2021-04-24: 1 mg via INTRAVENOUS
  Filled 2021-04-24 (×3): qty 1

## 2021-04-24 MED ORDER — MAGNESIUM SULFATE 2 GM/50ML IV SOLN
2.0000 g | Freq: Once | INTRAVENOUS | Status: AC
  Administered 2021-04-24: 2 g via INTRAVENOUS
  Filled 2021-04-24: qty 50

## 2021-04-24 NOTE — Plan of Care (Signed)

## 2021-04-24 NOTE — Plan of Care (Signed)
  Problem: Activity: Goal: Risk for activity intolerance will decrease Outcome: Progressing   Problem: Safety: Goal: Ability to remain free from injury will improve Outcome: Progressing   

## 2021-04-24 NOTE — Progress Notes (Signed)
[facility]                                                                               Jeffery Haynes, is a 88 y.o. male, DOB - 1931-07-08, EW:[id number] Admit date - 04/22/2021    Outpatient Primary MD for the patient is Garwin Brothers, MD  LOS - 2  days    Brief summary   Jeffery Haynes is a 88 y.o. male with medical history significant of advanced dementia, IDDM with left sided residual weakness, HTN, BPH, who was sent from nursing home for altered mentation. Found febrile temperature 100.9, tachycardia, blood pressure elevated, no hypoxia.  WBC 14.3, COVID positive and CT chest abdomen pelvis showed acute no acute findings.  UA showed no nitrite no leukocyte. Acid 2.0.   ED discussed with patient and family regarding lumbar puncture, family declined.   Neurology was consulted who ordered MRI.  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  Moderate stenosis of right M1 segment and severe stenosis inferior division right MCA.    Assessment & Plan    Assessment and Plan: Sepsis ([place])- (present on admission) Sepsis ruled out.  Pt was febrile,  had leukocytosis and AMS on admission. He is more awake now but agitated, not following commands and unable to assess his swallowing.  He was started on broad spectrum IV Antibiotics, , so far negative cultures.  Will d/c antibiotics and monitor.   Acute metabolic encephalopathy- (present on admission) Suspect secondary to COVID 19 infection or COVID encephalopathy/ decreased po intake. LP was recommended but pt 's family refused.  On exam, he is alert, but not following commands.  Continue with remdesevir to complete the course.   Hypomagnesemia Replaced, repeat in am.   Seizure ([place]) None this admission. Transitioned to IV valproic acid as pt is NPO.   Diabetes mellitus type 2 in nonobese (HCC) Hemoglobin A1c is 7.4%  Currently on SSI.  CBG (last 3)  Recent Labs     04/23/21 0228 04/23/21 0800 04/23/21 1148  GLUCAP 182* 157* 112*     Essential hypertension- (present on admission) Not well controlled.  Continue with labetalol and hydralazine.   Alzheimer's dementia ([place])- (present on admission) Dementia ([place])- (present on admission) Agitated.  SLP eval reordered.   COVID 19 infection: - on Remdesevir.  - repeat test ordered.  - unclear if this is new infection.     Estimated body mass index is 19.43 kg/m as calculated from the following:   Height as of 12/27/20: 6' (1.829 m).   Weight as of 12/27/20: 65 kg.  Code Status: DNR DVT Prophylaxis:  heparin injection 5,000 Units Start: 04/22/21 2200   Level of Care: Level of care: Telemetry Medical Family Communication: none at bedside.   Disposition Plan:     Remains inpatient appropriate:  AMS  Procedures:  MRI brain   Consultants:   neurology  Antimicrobials:  Antibiotics Given (last 72 hours)     Date/Time Action Medication Dose Rate   04/22/21 1327 New Bag/Given   ceFEPIme (MAXIPIME) 2 g in sodium chloride 0.9 % 100 mL IVPB 2 g 200 mL/hr   04/22/21  1328 New Bag/Given   metroNIDAZOLE (FLAGYL) IVPB 500 mg 500 mg 100 mL/hr   04/22/21 1409 New Bag/Given   vancomycin (VANCOREADY) IVPB 1500 mg/300 mL 1,500 mg 150 mL/hr   04/22/21 2032 New Bag/Given   remdesivir 200 mg in sodium chloride 0.9% 250 mL IVPB 200 mg 580 mL/hr   04/23/21 0051 New Bag/Given   ceFEPIme (MAXIPIME) 2 g in sodium chloride 0.9 % 100 mL IVPB 2 g 200 mL/hr   04/23/21 1003 New Bag/Given   remdesivir 100 mg in sodium chloride 0.9 % 100 mL IVPB 100 mg 200 mL/hr   04/23/21 1007 New Bag/Given   ceFEPIme (MAXIPIME) 2 g in sodium chloride 0.9 % 100 mL IVPB 2 g 200 mL/hr   04/23/21 1410 New Bag/Given   vancomycin (VANCOREADY) IVPB 1250 mg/250 mL 1,250 mg 166.7 mL/hr   04/23/21 2147 New Bag/Given   ceFEPIme (MAXIPIME) 2 g in sodium chloride 0.9 % 100 mL IVPB 2 g 200 mL/hr       Medications  Scheduled  Meds:  citalopram  15 mg Oral q AM   donepezil  5 mg Oral QHS   heparin  5,000 Units Subcutaneous Q12H   insulin aspart  0-15 Units Subcutaneous TID WC   polyethylene glycol  17 g Oral Daily   senna-docusate  2 tablet Oral BID   tamsulosin  0.4 mg Oral QHS   Continuous Infusions:  lactated ringers 100 mL/hr at 04/24/21 0640   magnesium sulfate bolus IVPB 2 g (04/24/21 1113)   remdesivir 100 mg in NS 100 mL Stopped (04/23/21 1033)   valproate sodium Stopped (04/23/21 2301)   PRN Meds:.acetaminophen, hydrALAZINE, labetalol, traZODone    Subjective:   Jeffery Haynes was seen and examined today.  Agitated, not following commands.   Objective:   Vitals:   04/24/21 0057 04/24/21 0134 04/24/21 0440 04/24/21 0809  BP: (!) 154/90 (!) 143/90 (!) 145/85 (!) 160/79  Pulse: 71 64 70 86  Resp: 18  16 16   Temp: 98.7 F (37.1 C)  98.7 F (37.1 C) 98.7 F (37.1 C)  TempSrc: Axillary  Axillary Axillary  SpO2: 98%  98% 99%    Intake/Output Summary (Last 24 hours) at 04/24/2021 1114 Last data filed at 04/24/2021 0640 Gross per 24 hour  Intake 2337.86 ml  Output 3300 ml  Net -962.14 ml    There were no vitals filed for this visit.   Exam General exam: Appears calm and comfortable  Respiratory system: Clear to auscultation. Respiratory effort normal. Cardiovascular system: S1 & S2 heard, RRR. No JVD,  No pedal edema. Gastrointestinal system: Abdomen is nondistended, soft and nontender.  Normal bowel sounds heard. Central nervous system: Alert, agitated, not following commands.  Extremities: no pedal edema.  Skin: No rashes seen.  Psychiatry: cannot be assessed.      Data Reviewed:  I have personally reviewed following labs and imaging studies   CBC Lab Results  Component Value Date   WBC 8.8 04/24/2021   RBC 4.26 04/24/2021   HGB 12.5 (L) 04/24/2021   HCT 37.6 (L) 04/24/2021   MCV 88.3 04/24/2021   MCH 29.3 04/24/2021   PLT 227 04/24/2021   MCHC 33.2 04/24/2021    RDW 14.2 04/24/2021   LYMPHSABS 1.5 04/24/2021   MONOABS 0.5 04/24/2021   EOSABS 0.0 04/24/2021   BASOSABS 0.0 0000000     Last metabolic panel Lab Results  Component Value Date   NA 131 (L) 04/24/2021   K 4.0 04/24/2021   CL  97 (L) 04/24/2021   CO2 23 04/24/2021   BUN 19 04/24/2021   CREATININE 0.86 04/24/2021   GLUCOSE 148 (H) 04/24/2021   GFRNONAA >60 04/24/2021   GFRAA >60 09/28/2019   CALCIUM 8.7 (L) 04/24/2021   PHOS 3.3 04/24/2021   PROT 5.9 (L) 04/24/2021   ALBUMIN 3.0 (L) 04/24/2021   BILITOT 0.8 04/24/2021   ALKPHOS 63 04/24/2021   AST 13 (L) 04/24/2021   ALT 10 04/24/2021   ANIONGAP 11 04/24/2021    CBG (last 3)  Recent Labs    04/23/21 1611 04/23/21 2149 04/24/21 0619  GLUCAP 127* 102* 135*       Coagulation Profile: Recent Labs  Lab 04/22/21 1415  INR 1.0      Radiology Studies: CT ANGIO HEAD NECK W WO CM  Result Date: 04/22/2021 CLINICAL DATA:  Acute neuro deficit.  Suspect stroke. EXAM: CT ANGIOGRAPHY HEAD AND NECK TECHNIQUE: Multidetector CT imaging of the head and neck was performed using the standard protocol during bolus administration of intravenous contrast. Multiplanar CT image reconstructions and MIPs were obtained to evaluate the vascular anatomy. Carotid stenosis measurements (when applicable) are obtained utilizing NASCET criteria, using the distal internal carotid diameter as the denominator. RADIATION DOSE REDUCTION: This exam was performed according to the departmental dose-optimization program which includes automated exposure control, adjustment of the mA and/or kV according to patient size and/or use of iterative reconstruction technique. CONTRAST:  134mL OMNIPAQUE IOHEXOL 350 MG/ML SOLN COMPARISON:  CT head 04/22/2021.  CT angio head and neck 12/12/2020 FINDINGS: CTA NECK FINDINGS Aortic arch: Standard branching. Imaged portion shows no evidence of aneurysm or dissection. No significant stenosis of the major arch vessel  origins. Mild atherosclerotic disease aortic arch. Right carotid system: Mild atherosclerotic calcification right carotid bulb. Negative for right carotid stenosis Left carotid system: Mild atherosclerotic calcification left carotid bulb. Negative for stenosis Vertebral arteries: Moderate to severe stenosis at the origin of the vertebral artery bilaterally due to calcific plaque. Both vertebral arteries are then patent to the basilar. Skeleton: Multilevel cervical disc degeneration. No acute skeletal abnormality. Other neck: Negative for mass or adenopathy. Upper chest: Lung apices clear bilaterally. Review of the MIP images confirms the above findings CTA HEAD FINDINGS Anterior circulation: Atherosclerotic calcification throughout the cavernous carotid bilaterally causing mild to moderate stenosis bilaterally. 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 anterior cerebral artery infarct. Moderate stenosis proximal right M1 segment. Severe stenosis inferior division of right MCA. Mild disease in the superior division of the right MCA. Left middle cerebral artery patent with scattered mild atherosclerotic disease. Posterior circulation: Both vertebral arteries patent to the basilar. PICA patent on the left. Right PICA appears supplied from the right AICA. Basilar widely patent. Superior cerebellar and posterior cerebral arteries patent bilaterally. Mild atherosclerotic disease in the posterior cerebral artery bilaterally. Venous sinuses: Normal venous enhancement Anatomic variants: None Review of the MIP images confirms the above findings IMPRESSION: 1. 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 anterior cerebral artery infarct. This is unchanged from the prior CTA. 2. Moderate stenosis right M1 segment. Severe stenosis inferior division right middle cerebral artery, this appeared occluded previously and has recanalized in the interval. 3. Mild atherosclerotic disease of the carotid  bifurcation bilaterally without significant stenosis 4. Moderate to severe stenosis at the origin of the vertebral artery bilaterally. Electronically Signed   By: Franchot Gallo M.D.   On: 04/22/2021 16:16   CT HEAD WO CONTRAST (5MM)  Result Date: 04/22/2021 CLINICAL DATA:  Altered  mental status. EXAM: CT HEAD WITHOUT CONTRAST TECHNIQUE: Contiguous axial images were obtained from the base of the skull through the vertex without intravenous contrast. RADIATION DOSE REDUCTION: This exam was performed according to the departmental dose-optimization program which includes automated exposure control, adjustment of the mA and/or kV according to patient size and/or use of iterative reconstruction technique. COMPARISON:  March 02, 2021. FINDINGS: Brain: Right parietal encephalomalacia is noted. Mild diffuse cortical atrophy is noted. Mild chronic ischemic white matter disease is noted. No mass effect or midline shift is noted. Ventricular size is within normal limits. There is no evidence of mass lesion, hemorrhage or acute infarction. Vascular: No hyperdense vessel or unexpected calcification. Skull: Normal. Negative for fracture or focal lesion. Sinuses/Orbits: No acute finding. Other: None. IMPRESSION: No acute intracranial abnormality seen. Electronically Signed   By: Marijo Conception M.D.   On: 04/22/2021 14:37   MR BRAIN WO CONTRAST  Result Date: 04/23/2021 CLINICAL DATA:  Mental status change, unknown cause EXAM: MRI HEAD WITHOUT CONTRAST TECHNIQUE: Multiplanar, multiecho pulse sequences of the brain and surrounding structures were obtained without intravenous contrast. COMPARISON:  CT head April 22, 2021.  MRI December 13, 2020. FINDINGS: Brain: Large remote right ACA territory infarct with similar remote blood products and encephalomalacia. Additional patchy T2/FLAIR hyperintensity white matter, nonspecific compatible with chronic microvascular ischemic disease. No evidence of acute infarct, acute  hemorrhage, mass lesion, midline shift, hydrocephalus, or extra-axial fluid collection. Chronic mega cisterna magna. Chronic microhemorrhage in the medial right temporal lobe. Moderate atrophy. Vascular: Major arterial flow voids are maintained at the skull base. Skull and upper cervical spine: Normal marrow signal. Sinuses/Orbits: Mild paranasal sinus mucosal thickening. Unremarkable orbits. Other: Small bilateral mastoid effusions. IMPRESSION: 1. No evidence of acute abnormality. 2. Large remote right ACA territory infarct. 3. Moderate chronic microvascular disease and cerebral atrophy (ICD10-G31.9). Electronically Signed   By: Margaretha Sheffield M.D.   On: 04/23/2021 09:29   CT CHEST ABDOMEN PELVIS W CONTRAST  Result Date: 04/22/2021 CLINICAL DATA:  Sepsis EXAM: CT CHEST, ABDOMEN, AND PELVIS WITH CONTRAST TECHNIQUE: Multidetector CT imaging of the chest, abdomen and pelvis was performed following the standard protocol during bolus administration of intravenous contrast. RADIATION DOSE REDUCTION: This exam was performed according to the departmental dose-optimization program which includes automated exposure control, adjustment of the mA and/or kV according to patient size and/or use of iterative reconstruction technique. CONTRAST:  110mL OMNIPAQUE IOHEXOL 350 MG/ML SOLN COMPARISON:  None. FINDINGS: CT CHEST FINDINGS Cardiovascular: Normal heart size. No pericardial effusion. Left main and three-vessel coronary artery calcifications. Atherosclerotic disease of the thoracic aorta. Mediastinum/Nodes: Patulous esophagus. Thyroid is unremarkable. No pathologically enlarged lymph nodes seen in the chest. Lungs/Pleura: Central airways are patent. Linear opacities of the bilateral lower lobes which are likely due to scarring or atelectasis. No consolidation, pleural effusion or pneumothorax. Musculoskeletal: Old posterior left-sided rib fractures. No chest wall mass or suspicious bone lesions identified. CT ABDOMEN  PELVIS FINDINGS Hepatobiliary: No focal liver abnormality is seen. No gallstones, gallbladder wall thickening, or biliary dilatation. Pancreas: Unremarkable. No pancreatic ductal dilatation or surrounding inflammatory changes. Spleen: Normal in size without focal abnormality. Adrenals/Urinary Tract: No hydronephrosis or nephrolithiasis. Renal vascular calcifications with no definite nephrolithiasis. Bladder wall thickening. Stomach/Bowel: Stomach is within normal limits. Appendix appears normal. Diverticulosis. No evidence of bowel wall thickening, distention, or inflammatory changes. Vascular/Lymphatic: Aortic atherosclerosis. No enlarged abdominal or pelvic lymph nodes. Reproductive: Mild prostatomegaly. Other: Tiny fat containing umbilical hernia. No abdominopelvic ascites. Musculoskeletal: Unchanged mild  compression deformities of the thoracic and lumbar spine. No acute or significant osseous findings. IMPRESSION: 1. No acute findings in the chest, abdomen or pelvis. 2.  Aortic Atherosclerosis (ICD10-I70.0). Electronically Signed   By: Yetta Glassman M.D.   On: 04/22/2021 16:10   DG Chest Port 1 View  Result Date: 04/22/2021 CLINICAL DATA:  Questionable sepsis EXAM: PORTABLE CHEST 1 VIEW COMPARISON:  12/17/2020 FINDINGS: Normal mediastinum and cardiac silhouette. Normal pulmonary vasculature. No evidence of effusion, infiltrate, or pneumothorax. No acute bony abnormality. IMPRESSION: No acute cardiopulmonary process. Electronically Signed   By: Suzy Bouchard M.D.   On: 04/22/2021 13:40   EEG adult  Result Date: 04/23/2021 Mickie Hillier, MD     04/23/2021  8:42 AM Telespecialist EEG Report  Routine EEG Date: 04/22/21 Read 04/23/21 Duration 22:02 Clinical Indication: encephalopathy r/o seizures EEG Procedure:  This is a digitally recorded ROUTINE electroencephalogram. The international 10-20 electrode placement system is used for scalp electrode placement. Eighteen channels of scalp EEG are recorded. The  data are stored digitally and reviewed in reformatted montages for optimal display.  EEG Description: This EEG is not well organized.  There is no clear posterior dominant rhythm.  The background consists of a mixture of a disorganized mixture of delta, theta, intermixed with faster frequencies.  There is no discreet sleep architecture.  There are no focal abnormalities, persistent asymmetries or epileptiform discharges. Activating procedures:  Photic stimulation was not performed.  Hyperventilation was not performed. EKG:  The EKG rhythm strip demonstrated a NSR at 78 bpm. EEG Classification:  Abnormal Background slowing, generalized EEG Interpretation: This routine EEG recorded in the comatose state is abnormal.  The background slowing suggest moderate to severe diffuse and/or multifocal cerebral dysfunction.  There are no focal abnormalities, persistent asymmetries or epileptiform discharges.        Hosie Poisson M.D. [facility] 04/24/2021, 11:14 AM  Available via Epic secure chat 7am-7pm After 7 pm, please refer to night coverage provider listed on amion.

## 2021-04-24 NOTE — Progress Notes (Signed)
Speech Language Pathology Treatment: Dysphagia  Patient Details Name: Jeffery Haynes MRN: [id number] DOB: 01-Nov-1931 Today's Date: 04/24/2021 Time: 9379-0240 SLP Time Calculation (min) (ACUTE ONLY): 23 min  Assessment / Plan / Recommendation Clinical Impression  Pt is alert but more vocal today, vocalizing throughout session. Per RN, this started approximately 10 minutes prior to SLP arrival. Pt still does not open his mouth to any POs offered and therefore pharyngeal swallowing cannot be assessed clinically. Even if he is capable of swallowing, with his current mentation he would likely not be able to consume enough. Will continue to follow with hopeful progression to PO diet if mentation begins to improve. Would consider if temporary, alternative means of nutrition (cortrak) would be within GOC for pt/family.   HPI HPI: Pt is an 88 yo male presenting from SNF with AMS. COVID positive. CT Chest and MRI Brain without acute findings. Previous clinical swallowing evaluations in March and October 2022 both revealed relatively functional swallowing despite cognitive deficits with regular solids and thin liquids recommended (meds crushed in puree per family). PMH includes: advanced dementia, stroke with residual L sided weakness, DM, HTN, BPH      SLP Plan  Continue with current plan of care      Recommendations for follow up therapy are one component of a multi-disciplinary discharge planning process, led by the attending physician.  Recommendations may be updated based on patient status, additional functional criteria and insurance authorization.    Recommendations  Diet recommendations: NPO Medication Administration: Via alternative means                Oral Care Recommendations: Oral care QID Follow Up Recommendations: Skilled nursing-short term rehab (<3 hours/day) Assistance recommended at discharge: Frequent or constant Supervision/Assistance SLP Visit Diagnosis: Dysphagia,  unspecified (R13.10) Plan: Continue with current plan of care           Mahala Menghini., M.A. CCC-SLP Acute Rehabilitation Services Pager [phone] Office [phone]  04/24/2021, 11:47 AM

## 2021-04-24 NOTE — Consult Note (Signed)
Consultation Note Date: 04/24/2021   Patient Name: Jeffery Haynes  DOB: 1931-03-20  MRN: [id number]  Age / Sex: 88 y.o., male  PCP: Garwin Brothers, MD Referring Physician: Hosie Poisson, MD  Reason for Consultation: Establishing goals of care  HPI/Patient Profile: 88 y.o. male  with past medical history of IDDM, stroke with left residual weakness, HTN, and BPH admitted on 04/22/2021 from [place] home with altered mental status.  Patient found to be COVID-positive with fever, tachycardia, and high blood pressure.   On 2/10 repeat COVID nasopharyngeal swab resulted as negative.  As per infection prevention, patient no longer considered COVID-positive but rather residual COVID from infection in October 2022.  Neurology was consulted.  CTA revealed chronic occlusion of right ACA and chronic right anterior cerebral infarct.  Also of note moderate stenosis of right M1 segment and severe stenosis of inferior divisional right MCA.  On admission family declined lumbar puncture.  SLP was consulted and patient made n.p.o. due to inability to clear either water or his own secretions without choking and coughing.  Clinical Assessment and Goals of Care: I have reviewed medical records including EPIC notes, labs and imaging, assessed the patient and then met with patient and his sister Elvis Coil to discuss diagnosis prognosis, [place], EOL wishes, disposition and options.  Elvis Coil shared patient's wife is not able to visit at the hospital today because she is in rehabilitation for a recent knee replacement.  Elvis Coil shares wife is unavailable by phone but would like to be called and contacted by palliative medicine tomorrow, 2/11.  Patient did not acknowledge my presence upon entry into the room.  He did not participate in discussions with sister at bedside.  He is easily arousable and able to open eyes to physical touch.   However, he was nonverbal and unable to make his wishes known.  I introduced Palliative Medicine as specialized medical care for people living with serious illness. It focuses on providing relief from the symptoms and stress of a serious illness. The goal is to improve quality of life for both the patient and the family.  We discussed a brief life review of the patient.  Patient is 1 of 13 children, 5 of whom are still living.  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  He has been married to his wife for over 53 years.  They have 5 daughters.  Elvis Coil shares childhood stories wherein he never once argued or fought with her.  As far as nutritional status Elvis Coil endorses patient was feeding himself up until about a month ago.  Patient was receiving OT at [facility] endorses when it stopped patient was no longer able to move the food from the plate to his mouth.  She endorses someone has had to feed him over the last month.  As far as functional status patient has not been able to be ambulatory for "some time now" and patient is bedbound at baseline.  As far as cognitive  endorses patient was able to make conversation and had some difficulty in initiating his sentences.  She reports he could carry on a conversation and occasionally needed additional time to gather his thoughts. However, she notices a sharp decline since the patient is now essentially nonverbal or vocalizes nonsensical words. ° °We discussed patient's current illness and what it means in the larger context of patient's on-going co-morbidities.  I attempted to elicit values and goals of care important to the patient.  Queen shares that the patient's wife knows the patient would never want to have a feeding tube.  Queen does not know any other wishes to be on this. ° °The difference between aggressive medical intervention and comfort care was considered in light of the patient's goals of care.  She  remains hopeful and steadfast in prayer that the patient will improve.  I shared that my main concern is that his baseline has now become even more significantly debilitated.  I shared my concern about his nutritional status  since he is not taking any PO.  ° °Education offered regarding concept specific to human mortality and the limitations of medical interventions to prolong life when the body begins to fail to thrive. ° °Queen shared she thinks the patient needs more time to see if he will improve. We discussed allowing time for outcomes, hoping for a miracle, and also being realistic with the patient's current health state.  °  °Discussed with patient/family the importance of continued conversation with family and the medical providers regarding overall plan of care and treatment options, ensuring decisions are within the context of the patient’s values and GOCs.   ° °Questions and concerns were addressed. The family was encouraged to call with questions or concerns. I will ask my colleague to speak with the patient's wife tomorrow, 2/11, as wife is not available by phone today. PMT will continue to follow the patient throughout his hospitalization.  ° °Primary Decision Maker °NEXT OF KIN ° °Code Status/Advance Care Planning: °DNR ° °Prognosis:   °Unable to determine ° °Discharge Planning: To Be Determined ° °Primary Diagnoses: °Present on Admission: ° Acute metabolic encephalopathy ° AMS (altered mental status) ° Alzheimer's dementia (HCC) ° Essential hypertension ° Dementia (HCC) ° Sepsis (HCC) ° ° °Physical Exam °Vitals and nursing note reviewed.  °Constitutional:   °   General: He is not in acute distress. °   Appearance: He is not ill-appearing.  °HENT:  °   Head: Normocephalic.  °   Comments: Healing bruise on right of forehead °   Mouth/Throat:  °   Mouth: Mucous membranes are moist.  °Cardiovascular:  °   Rate and Rhythm: Normal rate.  °Pulmonary:  °   Effort: Pulmonary effort is normal.  °Abdominal:  °    Palpations: Abdomen is soft.  °Musculoskeletal:  °   Cervical back: Normal range of motion.  °   Comments: Generalized weakness, bedbound  °Skin: °   General: Skin is warm.  °Neurological:  °   Comments: nonverbal  ° ° °Palliative Assessment/Data: 30% ° ° ° ° °I discussed this patient's plan of care with patient, patient's sister, RN Ashley. ° °Thank you for this consult. Palliative medicine will continue to follow and assist holistically.  ° °Time Total: 75 minutes °Greater than 50%  of this time was spent counseling and coordinating care related to the above assessment and plan. ° °Signed by: ° , DNP, FNP-BC °Palliative Medicine ° °  °Please contact Palliative Medicine Team phone   Team phone at [phone] for questions and concerns.  For individual provider: See Shea Evans

## 2021-04-24 NOTE — Progress Notes (Signed)
Infection Prevention  Unit: 3W Regarding: Covid Status of patient IP Recommendation: CT 39.6. on 2/8 1051 hrs: Jeffery Haynes. Jeffery Haynes had a positive Covid in Oct, and recently positive a couple of days ago. When the Provider team does rounds, can you ask to repeat the Covid test? Depending on the the test read we might be able to get Jeffery Haynes off of precautions.  1400 hrs: Repeat test is negative. His chest imagings are unremarkable. His VS look ok now except for his BP. On Room Air. His first Covid test was probably residual of his Covid in Oct. There was a high CT value for his Covid on test on 2/8, this test cycled high enough to show that it was negative. From an IP standpoint he does not appear to have Covid currently. Jeffery Haynes to talk with the Provider team and let them know that this does not appear to be Covid and he can come off of Airborne/Contact precautions, unless they feel there is a need/concern that he needs to stay on.

## 2021-04-25 DIAGNOSIS — Z515 Encounter for palliative care: Secondary | ICD-10-CM

## 2021-04-25 DIAGNOSIS — R001 Bradycardia, unspecified: Secondary | ICD-10-CM | POA: Diagnosis not present

## 2021-04-25 DIAGNOSIS — Z7189 Other specified counseling: Secondary | ICD-10-CM

## 2021-04-25 LAB — COMPREHENSIVE METABOLIC PANEL
ALT: 10 U/L (ref 0–44)
AST: 11 U/L — ABNORMAL LOW (ref 15–41)
Albumin: 2.8 g/dL — ABNORMAL LOW (ref 3.5–5.0)
Alkaline Phosphatase: 66 U/L (ref 38–126)
Anion gap: 7 (ref 5–15)
BUN: 19 mg/dL (ref 8–23)
CO2: 29 mmol/L (ref 22–32)
Calcium: 8.9 mg/dL (ref 8.9–10.3)
Chloride: 103 mmol/L (ref 98–111)
Creatinine, Ser: 0.94 mg/dL (ref 0.61–1.24)
GFR, Estimated: >60 mL/min (ref >60)
Glucose, Bld: 121 mg/dL — ABNORMAL HIGH (ref 70–99)
Potassium: 3.9 mmol/L (ref 3.5–5.1)
Sodium: 139 mmol/L (ref 135–145)
Total Bilirubin: 0.6 mg/dL (ref 0.3–1.2)
Total Protein: 5.7 g/dL — ABNORMAL LOW (ref 6.5–8.1)

## 2021-04-25 LAB — CBC WITH DIFFERENTIAL/PLATELET
Abs Immature Granulocytes: 0.08 10*3/uL — ABNORMAL HIGH (ref 0.00–0.07)
Basophils Absolute: 0.1 10*3/uL (ref 0.0–0.1)
Basophils Relative: 1 %
Eosinophils Absolute: 0.1 10*3/uL (ref 0.0–0.5)
Eosinophils Relative: 1 %
HCT: 36.2 % — ABNORMAL LOW (ref 39.0–52.0)
Hemoglobin: 11.7 g/dL — ABNORMAL LOW (ref 13.0–17.0)
Immature Granulocytes: 1 %
Lymphocytes Relative: 27 %
Lymphs Abs: 2.3 10*3/uL (ref 0.7–4.0)
MCH: 29.3 pg (ref 26.0–34.0)
MCHC: 32.3 g/dL (ref 30.0–36.0)
MCV: 90.5 fL (ref 80.0–100.0)
Monocytes Absolute: 0.9 10*3/uL (ref 0.1–1.0)
Monocytes Relative: 10 %
Neutro Abs: 5.1 10*3/uL (ref 1.7–7.7)
Neutrophils Relative %: 60 %
Platelets: 216 10*3/uL (ref 150–400)
RBC: 4 MIL/uL — ABNORMAL LOW (ref 4.22–5.81)
RDW: 14.4 % (ref 11.5–15.5)
WBC: 8.4 10*3/uL (ref 4.0–10.5)
nRBC: 0 % (ref 0.0–0.2)

## 2021-04-25 LAB — C-REACTIVE PROTEIN: CRP: 0.9 mg/dL (ref <1.0)

## 2021-04-25 LAB — MAGNESIUM: Magnesium: 2.1 mg/dL (ref 1.7–2.4)

## 2021-04-25 LAB — GLUCOSE, CAPILLARY
Glucose-Capillary: 98 mg/dL (ref 70–99)
Glucose-Capillary: 103 mg/dL — ABNORMAL HIGH (ref 70–99)
Glucose-Capillary: 166 mg/dL — ABNORMAL HIGH (ref 70–99)
Glucose-Capillary: 107 mg/dL — ABNORMAL HIGH (ref 70–99)

## 2021-04-25 LAB — PHOSPHORUS: Phosphorus: 3.6 mg/dL (ref 2.5–4.6)

## 2021-04-25 LAB — D-DIMER, QUANTITATIVE: D-Dimer, Quant: 0.6 ug/mL-FEU — ABNORMAL HIGH (ref 0.00–0.50)

## 2021-04-25 LAB — FERRITIN: Ferritin: 89 ng/mL (ref 24–336)

## 2021-04-25 MED ORDER — FOOD THICKENER (SIMPLYTHICK)
1.0000 | ORAL | Status: DC | PRN
  Filled 2021-04-25: qty 1

## 2021-04-25 NOTE — Progress Notes (Signed)
[facility]                                                                               [name], is a 88 y.o. male, DOB - Dec 07, 1931, [id number] Admit date - 04/22/2021    Outpatient Primary MD for the patient is Garwin Brothers, MD  LOS - 3  days    Brief summary   Jeffery Haynes is a 88 y.o. male with medical history significant of advanced dementia, IDDM with left sided residual weakness, HTN, BPH, who was sent from nursing home for altered mentation. Found febrile temperature 100.9, tachycardia, blood pressure elevated, no hypoxia.  WBC 14.3, COVID positive and CT chest abdomen pelvis showed acute no acute findings.  UA showed no nitrite no leukocyte. Acid 2.0.   ED discussed with patient and family regarding lumbar puncture, family declined.   Neurology was consulted who ordered MRI.  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  Moderate stenosis of right M1 segment and severe stenosis inferior division right MCA.  Pt seen and examined, he remains confused, non verbal, not following commands.     Assessment & Plan    Assessment and Plan: Sepsis ([place])- (present on admission) Sepsis ruled out.  Pt was febrile,  had leukocytosis and AMS on admission. He is more awake right now, not following commands and non verbal. SLP  on board.  He was started on broad spectrum IV Antibiotics, , so far negative cultures.  Will d/c antibiotics and monitor.   Acute metabolic encephalopathy- (present on admission) Suspect secondary to COVID 19 infection or COVID encephalopathy/ decreased po intake. LP was recommended but pt 's family refused. He is more awake right now, not following commands and non verbal. SLP  on board.  On exam, he is alert, but not following commands.    Hypomagnesemia Replaced.   Seizure ([place]) None this admission. Transitioned to IV valproic acid as pt is NPO.   Diabetes mellitus type 2 in  nonobese (HCC) Hemoglobin A1c is 7.4%  Currently on SSI.  CBG (last 3)  Recent Labs    04/23/21 0228 04/23/21 0800 04/23/21 1148  GLUCAP 182* 157* 112*     Essential hypertension- (present on admission) BP parameters have improved.    Bradycardia:  - improved after stopping the labetalol.   Alzheimer's dementia ([place])- (present on admission) Dementia ([place])- (present on admission) Agitated.  SLP eval reordered.   COVID 19 infection: -completed 3 days of remdesevir.     Estimated body mass index is 19.43 kg/m as calculated from the following:   Height as of 12/27/20: 6' (1.829 m).   Weight as of 12/27/20: 65 kg.  Code Status: DNR DVT Prophylaxis:  heparin injection 5,000 Units Start: 04/22/21 2200   Level of Care: Level of care: Telemetry Medical Family Communication: none at bedside.   Disposition Plan:     Remains inpatient appropriate:  AMS  Procedures:  MRI brain   Consultants:   neurology  Antimicrobials:  Antibiotics Given (last 72 hours)     Date/Time Action Medication Dose Rate   04/22/21 1327 New Bag/Given   ceFEPIme (MAXIPIME) 2 g  in sodium chloride 0.9 % 100 mL IVPB 2 g 200 mL/hr   04/22/21 1328 New Bag/Given   metroNIDAZOLE (FLAGYL) IVPB 500 mg 500 mg 100 mL/hr   04/22/21 1409 New Bag/Given   vancomycin (VANCOREADY) IVPB 1500 mg/300 mL 1,500 mg 150 mL/hr   04/22/21 2032 New Bag/Given   remdesivir 200 mg in sodium chloride 0.9% 250 mL IVPB 200 mg 580 mL/hr   04/23/21 0051 New Bag/Given   ceFEPIme (MAXIPIME) 2 g in sodium chloride 0.9 % 100 mL IVPB 2 g 200 mL/hr   04/23/21 1003 New Bag/Given   remdesivir 100 mg in sodium chloride 0.9 % 100 mL IVPB 100 mg 200 mL/hr   04/23/21 1007 New Bag/Given   ceFEPIme (MAXIPIME) 2 g in sodium chloride 0.9 % 100 mL IVPB 2 g 200 mL/hr   04/23/21 1410 New Bag/Given   vancomycin (VANCOREADY) IVPB 1250 mg/250 mL 1,250 mg 166.7 mL/hr   04/23/21 2147 New Bag/Given   ceFEPIme (MAXIPIME) 2 g in sodium chloride  0.9 % 100 mL IVPB 2 g 200 mL/hr   04/24/21 1241 New Bag/Given  [dose time moved due to compatibility with other IV meds not tested.]   remdesivir 100 mg in sodium chloride 0.9 % 100 mL IVPB 100 mg 200 mL/hr       Medications  Scheduled Meds:  citalopram  15 mg Oral q AM   donepezil  5 mg Oral QHS   heparin  5,000 Units Subcutaneous Q12H   insulin aspart  0-15 Units Subcutaneous TID WC   polyethylene glycol  17 g Oral Daily   senna-docusate  2 tablet Oral BID   tamsulosin  0.4 mg Oral QHS   Continuous Infusions:  lactated ringers 100 mL/hr at 04/25/21 0309   valproate sodium 500 mg (04/24/21 2258)   PRN Meds:.acetaminophen, haloperidol lactate, hydrALAZINE, traZODone    Subjective:   [name] was seen and examined today.  Alert, not in distress.   Objective:   Vitals:   04/24/21 2324 04/25/21 0010 04/25/21 0339 04/25/21 0803  BP: (!) 165/84 (!) 146/74 137/71 (!) 153/79  Pulse: 73  60 (!) 59  Resp: 16  16 19   Temp: 97.6 F (36.4 C)  97.7 F (36.5 C) 98.4 F (36.9 C)  TempSrc: Axillary  Axillary Oral  SpO2: 97%  98% 100%    Intake/Output Summary (Last 24 hours) at 04/25/2021 0957 Last data filed at 04/25/2021 0338 Gross per 24 hour  Intake 559.37 ml  Output 1750 ml  Net -1190.63 ml    There were no vitals filed for this visit.   Exam General exam: Appears calm and comfortable  Respiratory system: Clear to auscultation. Respiratory effort normal. Cardiovascular system: S1 & S2 heard, RRR. Marland Kitchen No pedal edema. Gastrointestinal system: Abdomen is nondistended, soft and nontender.  Normal bowel sounds heard. Central nervous system: Alert, abel to say some words like yes and No ,but not following commands.  Extremities: no pedal edema.  Skin: No rashes, lesions or ulcers Psychiatry: unable to assess.     Data Reviewed:  I have personally reviewed following labs and imaging studies   CBC Lab Results  Component Value Date   WBC 8.4 04/25/2021    RBC 4.00 (L) 04/25/2021   HGB 11.7 (L) 04/25/2021   HCT 36.2 (L) 04/25/2021   MCV 90.5 04/25/2021   MCH 29.3 04/25/2021   PLT 216 04/25/2021   MCHC 32.3 04/25/2021   RDW 14.4 04/25/2021   LYMPHSABS 2.3 04/25/2021   MONOABS  0.9 04/25/2021   EOSABS 0.1 04/25/2021   BASOSABS 0.1 [id number]     Last metabolic panel Lab Results  Component Value Date   NA 139 04/25/2021   K 3.9 04/25/2021   CL 103 04/25/2021   CO2 29 04/25/2021   BUN 19 04/25/2021   CREATININE 0.94 04/25/2021   GLUCOSE 121 (H) 04/25/2021   GFRNONAA >60 04/25/2021   GFRAA >60 09/28/2019   CALCIUM 8.9 04/25/2021   PHOS 3.6 04/25/2021   PROT 5.7 (L) 04/25/2021   ALBUMIN 2.8 (L) 04/25/2021   BILITOT 0.6 04/25/2021   ALKPHOS 66 04/25/2021   AST 11 (L) 04/25/2021   ALT 10 04/25/2021   ANIONGAP 7 04/25/2021    CBG (last 3)  Recent Labs    04/24/21 1625 04/24/21 2141 04/25/21 0607  GLUCAP 126* 110* 98       Coagulation Profile: Recent Labs  Lab 04/22/21 1415  INR 1.0      Radiology Studies: No results found.      Hosie Poisson M.D. [facility] 04/25/2021, 9:57 AM  Available via Epic secure chat 7am-7pm After 7 pm, please refer to night coverage provider listed on amion.

## 2021-04-25 NOTE — Progress Notes (Signed)
Dr. Antionette Char called back,  reviewed medication, and discontinued the Labetalol and said will further discuss plan in the morning.

## 2021-04-25 NOTE — Progress Notes (Signed)
Tele called to report  HR dropping down to the 30'S and also he had 4 beats of Vtach non-sustained. Asymptomatic and sleeping at this time. No signs of distress. Paged Dr. Antionette Char and waiting for a response.

## 2021-04-25 NOTE — Progress Notes (Signed)
Speech Language Pathology Treatment: Dysphagia  Patient Details Name: Jeffery Haynes MRN: [id number] DOB: 1932/02/11 Today's Date: 04/25/2021 Time: 0626-9485 SLP Time Calculation (min) (ACUTE ONLY): 29 min  Assessment / Plan / Recommendation Clinical Impression   Pt seen for skilled SLP to address pt's dysphagia goals.  Pt today fully alert when SLP walked into room.  Pt did not follow directions.  He "swung" at SLP x3 after SLP and NT slid pt up in bed and SLP raised HOB. Pt accepted po intake readily from SLP.    Multiple swallows (4-5) across all consistencies given - with delayed strong coug after approx 12 ounces nectar and applesauce.  Cough observed x2 during entire session - largely after large amounts of po - making SLP more concerned for potential esophageal deficits. Patulous esophagus on CT raises concerns for clinical findings possibly c/w esophageal dysmotility.   Note no plan for PEG per palliative notes, thus,  would recommend to allow floor stock of nectar liquids via straw in small amounts for comfort.  Pt needs to be allowed plenty of time to conduct multiple swallows per bolus.  During today's session - he readily accepted *Open mouth* for all po intake.    Follow up indicated to assess po tolerance. At this time, pt will not participate in any instrumental eval due to mentation/agitation, thus SLP can not rule out pharyngeal deficits. Pt was on a regular/thin diet Prior to admission however.  SLP informed RN of recommendations and messaged MD, RN, and palliative NP *who was in agreement but deferred final plan to hospitalist*.  Will follow closely for dysphagia management.          HPI HPI: Pt is an 88 yo male presenting from SNF with AMS. COVID positive. CT Chest and MRI Brain without acute findings. Previous clinical swallowing evaluations in March and October 2022 both revealed relatively functional swallowing despite cognitive deficits with regular solids and thin  liquids recommended (meds crushed in puree per family). PMH includes: advanced dementia, stroke with residual L sided weakness, DM, HTN, BPH      SLP Plan  Continue with current plan of care      Recommendations for follow up therapy are one component of a multi-disciplinary discharge planning process, led by the attending physician.  Recommendations may be updated based on patient status, additional functional criteria and insurance authorization.    Recommendations  Diet recommendations: Nectar-thick liquid Liquids provided via: Straw Medication Administration: Other (Comment) (with nectar liquids) Compensations: Small sips/bites (allow time for multiple swallows) Postural Changes and/or Swallow Maneuvers: Seated upright 90 degrees;Upright 30-60 min after meal                Oral Care Recommendations: Oral care QID Follow Up Recommendations: Skilled nursing-short term rehab (<3 hours/day) Assistance recommended at discharge: Frequent or constant Supervision/Assistance SLP Visit Diagnosis: Dysphagia, unspecified (R13.10) (suspect pharyngo=cervical esophageal  - ? esoph component given patulous esoph seen on CT imaging) Plan: Continue with current plan of care           Chales Abrahams. Rolena Infante, MS [facility] SLP Acute Rehab Services Office [phone] Pager [phone]   04/25/2021, 12:47 PM

## 2021-04-25 NOTE — Progress Notes (Signed)
SLP received communication from RN that pt's daughter requests he receive trays of current diet.    Per RN, "pt ate pretty well. 2 puddings and 2 apple sauce. Sister is bedside and she's wondering if he could be evaluated to be eligible for trays. Drunk 263ml fluids no issue."    SLP wrote for full liquid, nectar tray.   Will follow up.    Kathleen Lime, MS [facility] SLP Acute Rehab Services Office [phone] Pager [phone]

## 2021-04-25 NOTE — Progress Notes (Addendum)
Palliative Medicine Inpatient Follow Up Note  HPI:  88 y.o. male  with past medical history of IDDM, stroke with left residual weakness, HTN, and BPH admitted on 04/22/2021 from [place] home with altered mental status.  Patient found to be COVID-positive with fever, tachycardia, and high blood pressure. (+) Acute Stroke. SLP --> unable to clear secretions high aspiration risk.   Palliative care was asked to get involved to establish goals with patient's family in the setting of a significant.  Today's Discussion (04/25/2021):  *Please note that this is a verbal dictation therefore any spelling or grammatical errors are due to the "[employer]" system interpretation.  Chart reviewed inclusive of vital signs, progress notes, laboratory results, and diagnostic images.   I spoke to [facility] nurse this morning who expresses no significant concerns.  She shares that Jeffery Haynes is able to open his eyes and squeeze her hand this morning.  I met with Sekou at bedside he was able to open his eyes though the squeezing of my hand was not consistent.  He was not able to respond to questions.  I called the patient's spouse, Jeffery Haynes.  We reviewed Kathryn's  conversation with [facility] yesterday.   A formal discussion of Jeffery Haynes's past medical history was had inclusive of his diabetes mellitus, prior stroke, hypertension, Alzheimer's dementia, and more recent history of COVID-19.  We reviewed Haynes's decline over the past few years in the setting of his dementia.  Discussed that this has been hard to see and likely harder for Ori to endure.  We reviewed that in the setting of chronic illness and events such as strokes these can accelerate various dementias.  We discussed that one of the final phases of dementia is that whereby patients no longer wish to eat or drink.  Jeffery Haynes is very familiar with this and shares that she would not want artificial nutrition.  She goes on to  tell me that she realizes nothing else time unearth may be short though she does wish to give him a little bit more time to see if he could seek improvements.  I shared if in the oncoming days he either declines or neglects to improve it would behoove [place] to further talk about comfort care.  I also mentioned hospice as she would not be able to care for Jeffery Haynes being that she has recently had a knee replacement if he were to go home with hospice.  A review of comfort care was had inclusive of what that would entail inclusive of medications to control pain, dyspnea, agitation, nausea, itching, and hiccups.  We discussed stopping all uneccessary measures such as cardiac monitoring, blood draws, needle sticks, and frequent vital signs. Utilized reflective listening throughout our time together.   Created space and opportunity for patient to explore thoughts feelings and fears regarding current medical situation.  Jeffery Haynes is very much aligned with this plan.  She shares that "when it's you body's time, it's your body's time".  Questions and concerns addressed   Palliative Support Provided.   Objective Assessment: Vital Signs Vitals:   04/25/21 0803 04/25/21 1135  BP: (!) 153/79 (!) 162/72  Pulse: (!) 59 63  Resp: 19 16  Temp: 98.4 F (36.9 C) 98.5 F (36.9 C)  SpO2: 100% 100%    Intake/Output Summary (Last 24 hours) at 04/25/2021 1142 Last data filed at 04/25/2021 1139 Gross per 24 hour  Intake 559.37 ml  Output 2200 ml  Net -1640.63 ml  Gen: Frail elderly African-American male in no acute distress HEENT: moist mucous membranes CV: Regular rate and rhythm PULM: On room air breathing is even and nonlabored ABD: soft/nontender EXT: No edema Neuro: Somnolent  SUMMARY OF RECOMMENDATIONS   DNAR/DNI  Plan for time trial of present treatments until Monday  If no improvements are seen on Monday further conversations regarding comfort focused care and inpatient hospice will take  place  Ongoing palliative support at this time  Time Spent: 65  MDM - High  Medical Decision Making:4 #/Complex Problems: 4                     Data Reviewed:    4             Management: 4 (1-Straightforward, 2-Low, 3-Moderate, 4-High) ______________________________________________________________________________________ [facility] Palliative Medicine Team Team Cell Phone: [phone] Please utilize secure chat with additional questions, if there is no response within 30 minutes please call the above phone number  Palliative Medicine Team providers are available by phone from 7am to 7pm daily and can be reached through the team cell phone.  Should this patient require assistance outside of these hours, please call the patient's attending physician.

## 2021-04-25 NOTE — Plan of Care (Signed)
  Problem: Activity: Goal: Risk for activity intolerance will decrease Outcome: Progressing   Problem: Nutrition: Goal: Adequate nutrition will be maintained Outcome: Progressing   

## 2021-04-26 LAB — CBC WITH DIFFERENTIAL/PLATELET
Abs Immature Granulocytes: 0.07 10*3/uL (ref 0.00–0.07)
Basophils Absolute: 0 10*3/uL (ref 0.0–0.1)
Basophils Relative: 0 %
Eosinophils Absolute: 0.1 10*3/uL (ref 0.0–0.5)
Eosinophils Relative: 2 %
HCT: 33.6 % — ABNORMAL LOW (ref 39.0–52.0)
Hemoglobin: 10.7 g/dL — ABNORMAL LOW (ref 13.0–17.0)
Immature Granulocytes: 1 %
Lymphocytes Relative: 26 %
Lymphs Abs: 1.6 10*3/uL (ref 0.7–4.0)
MCH: 28.6 pg (ref 26.0–34.0)
MCHC: 31.8 g/dL (ref 30.0–36.0)
MCV: 89.8 fL (ref 80.0–100.0)
Monocytes Absolute: 0.6 10*3/uL (ref 0.1–1.0)
Monocytes Relative: 10 %
Neutro Abs: 3.8 10*3/uL (ref 1.7–7.7)
Neutrophils Relative %: 61 %
Platelets: 210 10*3/uL (ref 150–400)
RBC: 3.74 MIL/uL — ABNORMAL LOW (ref 4.22–5.81)
RDW: 14.3 % (ref 11.5–15.5)
WBC: 6.2 10*3/uL (ref 4.0–10.5)
nRBC: 0 % (ref 0.0–0.2)

## 2021-04-26 LAB — GLUCOSE, CAPILLARY
Glucose-Capillary: 121 mg/dL — ABNORMAL HIGH (ref 70–99)
Glucose-Capillary: 178 mg/dL — ABNORMAL HIGH (ref 70–99)
Glucose-Capillary: 147 mg/dL — ABNORMAL HIGH (ref 70–99)
Glucose-Capillary: 90 mg/dL (ref 70–99)

## 2021-04-26 LAB — COMPREHENSIVE METABOLIC PANEL
ALT: 12 U/L (ref 0–44)
AST: 13 U/L — ABNORMAL LOW (ref 15–41)
Albumin: 2.8 g/dL — ABNORMAL LOW (ref 3.5–5.0)
Alkaline Phosphatase: 62 U/L (ref 38–126)
Anion gap: 8 (ref 5–15)
BUN: 18 mg/dL (ref 8–23)
CO2: 27 mmol/L (ref 22–32)
Calcium: 8.5 mg/dL — ABNORMAL LOW (ref 8.9–10.3)
Chloride: 101 mmol/L (ref 98–111)
Creatinine, Ser: 0.81 mg/dL (ref 0.61–1.24)
GFR, Estimated: >60 mL/min (ref >60)
Glucose, Bld: 112 mg/dL — ABNORMAL HIGH (ref 70–99)
Potassium: 3.7 mmol/L (ref 3.5–5.1)
Sodium: 136 mmol/L (ref 135–145)
Total Bilirubin: 0.6 mg/dL (ref 0.3–1.2)
Total Protein: 5.2 g/dL — ABNORMAL LOW (ref 6.5–8.1)

## 2021-04-26 LAB — C-REACTIVE PROTEIN: CRP: 0.7 mg/dL (ref <1.0)

## 2021-04-26 LAB — MAGNESIUM: Magnesium: 1.7 mg/dL (ref 1.7–2.4)

## 2021-04-26 LAB — D-DIMER, QUANTITATIVE: D-Dimer, Quant: 0.33 ug/mL-FEU (ref 0.00–0.50)

## 2021-04-26 LAB — PHOSPHORUS: Phosphorus: 2.7 mg/dL (ref 2.5–4.6)

## 2021-04-26 LAB — FERRITIN: Ferritin: 81 ng/mL (ref 24–336)

## 2021-04-26 MED ORDER — DIVALPROEX SODIUM 250 MG PO DR TAB
500.0000 mg | DELAYED_RELEASE_TABLET | Freq: Two times a day (BID) | ORAL | Status: DC
  Administered 2021-04-26 – 2021-04-28 (×5): 500 mg via ORAL
  Filled 2021-04-26 (×5): qty 2

## 2021-04-26 NOTE — Progress Notes (Signed)
[facility]                                                                               Jeffery Haynes, is a 88 y.o. male, DOB - June 22, 1931, [id number] Admit date - 04/22/2021    Outpatient Primary MD for the patient is Garwin Brothers, MD  LOS - 4  days    Brief summary   Jeffery Haynes is a 88 y.o. male with medical history significant of advanced dementia, IDDM with left sided residual weakness, HTN, BPH, who was sent from nursing home for altered mentation. Found febrile temperature 100.9, tachycardia, blood pressure elevated, no hypoxia.  WBC 14.3, COVID positive and CT chest abdomen pelvis showed acute no acute findings.  UA showed no nitrite no leukocyte.   ED discussed with patient and family regarding lumbar puncture, family declined.   Neurology was consulted who ordered MRI.  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  Moderate stenosis of right M1 segment and severe stenosis inferior division right MCA. Pts mental status improved. He is more alert and has taken liquid diet with SLP.     Assessment & Plan    Assessment and Plan: Sepsis ([place])- (present on admission) Sepsis ruled out.  Pt was febrile,  had leukocytosis and AMS on admission. He is more awake right now, getting closer to baseline. No source of infection found so far.  He was started on broad spectrum IV Antibiotics on admission., in view of his improving mental status and negative cultures, we have discontinued antibiotics. He continues to improve. No fever .     Acute metabolic encephalopathy- (present on admission) Suspect secondary to COVID 19 infection or COVID encephalopathy/ decreased po intake. LP was recommended but pt 's family refused. He is more awake right now, not following commands, able to say one word answers. Able to take full liquid diet without any issues.     Hypomagnesemia Replaced. Repeat level wnl.   Seizure  ([place]) None this admission.  Resume depakote in the oral form as he is able to take by mouth.   Diabetes mellitus type 2 in nonobese (HCC) Hemoglobin A1c is 7.4%  Currently on SSI.  CBG (last 3)  Recent Labs    04/23/21 0228 04/23/21 0800 04/23/21 1148  GLUCAP 182* 157* 112*   No change in meds.   Essential hypertension- (present on admission) BP parameters have improved.  Continue to monitr.    Bradycardia:  - improved after stopping the labetalol.   Alzheimer's dementia ([place])- (present on admission) Dementia ([place])- (present on admission) No agitation seen today. Continue with home meds.   COVID 19 infection: -completed 3 days of remdesevir.     Estimated body mass index is 19.43 kg/m as calculated from the following:   Height as of 12/27/20: 6' (1.829 m).   Weight as of 12/27/20: 65 kg.  Code Status: DNR DVT Prophylaxis:  heparin injection 5,000 Units Start: 04/22/21 2200   Level of Care: Level of care: Telemetry Medical Family Communication: none at bedside.   Disposition Plan:     Remains inpatient appropriate:  therapy evals and palliative care meeting.  Procedures:  MRI brain   Consultants:   Neurology Palliative care.   Antimicrobials:  Antibiotics Given (last 72 hours)     Date/Time Action Medication Dose Rate   04/23/21 1410 New Bag/Given   vancomycin (VANCOREADY) IVPB 1250 mg/250 mL 1,250 mg 166.7 mL/hr   04/23/21 2147 New Bag/Given   ceFEPIme (MAXIPIME) 2 g in sodium chloride 0.9 % 100 mL IVPB 2 g 200 mL/hr   04/24/21 1241 New Bag/Given  [dose time moved due to compatibility with other IV meds not tested.]   remdesivir 100 mg in sodium chloride 0.9 % 100 mL IVPB 100 mg 200 mL/hr       Medications  Scheduled Meds:  citalopram  15 mg Oral q AM   divalproex  500 mg Oral Q12H   donepezil  5 mg Oral QHS   heparin  5,000 Units Subcutaneous Q12H   insulin aspart  0-15 Units Subcutaneous TID WC   polyethylene glycol  17 g Oral Daily    senna-docusate  2 tablet Oral BID   tamsulosin  0.4 mg Oral QHS   Continuous Infusions:   PRN Meds:.acetaminophen, food thickener, haloperidol lactate, hydrALAZINE, traZODone    Subjective:   Jeffery Haynes was seen and examined today.  Not in distress. Not agitated. Saying yes and no to questions.    Objective:   Vitals:   04/26/21 0030 04/26/21 0337 04/26/21 0800 04/26/21 1200  BP: 123/90 (!) 150/68 (!) 143/82 (!) (P) 149/77  Pulse: 62 66    Resp: 18 18 19    Temp:  98.6 F (37 C) (!) 97.4 F (36.3 C)   TempSrc:  Oral Oral   SpO2:  96% 100%     Intake/Output Summary (Last 24 hours) at 04/26/2021 1351 Last data filed at 04/26/2021 1208 Gross per 24 hour  Intake 240 ml  Output 2450 ml  Net -2210 ml    There were no vitals filed for this visit.   Exam General exam: Appears calm and comfortable  Respiratory system: Clear to auscultation. Respiratory effort normal. Cardiovascular system: S1 & S2 heard, RRR. No JVD, . No pedal edema. Gastrointestinal system: Abdomen is nondistended, soft and nontender.  Normal bowel sounds heard. Central nervous system: alert, does not communicate much. Able to move all extremities. Saying yes and No  to questions.  Extremities: no pedal edeam.  Skin: No rashes, lesions or ulcers Psychiatry: cannot be assessed.      Data Reviewed:  I have personally reviewed following labs and imaging studies   CBC Lab Results  Component Value Date   WBC 6.2 04/26/2021   RBC 3.74 (L) 04/26/2021   HGB 10.7 (L) 04/26/2021   HCT 33.6 (L) 04/26/2021   MCV 89.8 04/26/2021   MCH 28.6 04/26/2021   PLT 210 04/26/2021   MCHC 31.8 04/26/2021   RDW 14.3 04/26/2021   LYMPHSABS 1.6 04/26/2021   MONOABS 0.6 04/26/2021   EOSABS 0.1 04/26/2021   BASOSABS 0.0 [id number]     Last metabolic panel Lab Results  Component Value Date   NA 136 04/26/2021   K 3.7 04/26/2021   CL 101 04/26/2021   CO2 27 04/26/2021   BUN 18 04/26/2021   CREATININE  0.81 04/26/2021   GLUCOSE 112 (H) 04/26/2021   GFRNONAA >60 04/26/2021   GFRAA >60 09/28/2019   CALCIUM 8.5 (L) 04/26/2021   PHOS 2.7 04/26/2021   PROT 5.2 (L) 04/26/2021   ALBUMIN 2.8 (L) 04/26/2021   BILITOT 0.6 04/26/2021   ALKPHOS 62 04/26/2021  AST 13 (L) 04/26/2021   ALT 12 04/26/2021   ANIONGAP 8 04/26/2021    CBG (last 3)  Recent Labs    04/25/21 1645 04/25/21 2213 04/26/21 0625  GLUCAP 166* 107* 121*       Coagulation Profile: Recent Labs  Lab 04/22/21 1415  INR 1.0      Radiology Studies: No results found.      Hosie Poisson M.D. [facility] 04/26/2021, 1:51 PM  Available via Epic secure chat 7am-7pm After 7 pm, please refer to night coverage provider listed on amion.

## 2021-04-26 NOTE — Progress Notes (Signed)
Speech Language Pathology Treatment: Dysphagia  Patient Details Name: Jeffery Haynes MRN: [id number] DOB: Mar 21, 1931 Today's Date: 04/26/2021 Time: [id number] SLP Time Calculation (min) (ACUTE ONLY): 20 min  Assessment / Plan / Recommendation Clinical Impression  Patient seen with multiple family members present in room for skilled ST session focused on dysphagia goals. Patient was awake, alert and cooperative throughout. He was sitting up in bed and in a good position for safe PO intake. SLP observed him with straw sips and cup sips of thin liquids (water) as well as dysphagia 3 solids (diced canned peaches) and regular solids (graham cracker). He exhibited delayed coughing and throat clearing with thin liquids via straw sips and with graham cracker. Mastication was milldly prolonged but patient demonstrated good awareness to boluses in mouth and was observed to independently use tongue to clear residuals. Swallow initiation appeared timely with thin liquids. When given cup sips of thin liquids he did not exhibit any immediate or delayed coughing. At this time, SLP is recommending to upgrade patient's diet to Dys 3 solids, continue with nectar thick liquids during meal time but allow for thin liquids in between meals. Family educated regarding recommendations and swallow safety signs updated in patient's room; RN informed. Of note, patient does say that food/eating is very important to patient. ("He lives to eat").    HPI HPI: Pt is an 88 yo male presenting from SNF with AMS. COVID positive. CT Chest and MRI Brain without acute findings. Previous clinical swallowing evaluations in March and October 2022 both revealed relatively functional swallowing despite cognitive deficits with regular solids and thin liquids recommended (meds crushed in puree per family). PMH includes: advanced dementia, stroke with residual L sided weakness, DM, HTN, BPH      SLP Plan  Continue with current plan of care       Recommendations for follow up therapy are one component of a multi-disciplinary discharge planning process, led by the attending physician.  Recommendations may be updated based on patient status, additional functional criteria and insurance authorization.    Recommendations  Diet recommendations: Dysphagia 3 (mechanical soft);Nectar-thick liquid;Other(comment) (thin liquids in between meals) Liquids provided via: Straw;Cup (cup sips only with thins, straws ok with nectar thick) Medication Administration: Whole meds with puree Supervision: Staff to assist with self feeding;Full supervision/cueing for compensatory strategies;Trained caregiver to feed patient Compensations: Small sips/bites;Slow rate Postural Changes and/or Swallow Maneuvers: Seated upright 90 degrees;Upright 30-60 min after meal                Oral Care Recommendations: Oral care QID;Staff/trained caregiver to provide oral care Follow Up Recommendations: Skilled nursing-short term rehab (<3 hours/day) Assistance recommended at discharge: Frequent or constant Supervision/Assistance SLP Visit Diagnosis: Dysphagia, unspecified (R13.10) Plan: Continue with current plan of care         Sonia Baller, MA, CCC-SLP Speech Therapy

## 2021-04-27 ENCOUNTER — Inpatient Hospital Stay (HOSPITAL_COMMUNITY): Payer: Medicare Other

## 2021-04-27 LAB — CULTURE, BLOOD (ROUTINE X 2)
Culture: NO GROWTH
Culture: NO GROWTH
Special Requests: ADEQUATE
Special Requests: ADEQUATE

## 2021-04-27 LAB — GLUCOSE, CAPILLARY
Glucose-Capillary: 108 mg/dL — ABNORMAL HIGH (ref 70–99)
Glucose-Capillary: 130 mg/dL — ABNORMAL HIGH (ref 70–99)
Glucose-Capillary: 132 mg/dL — ABNORMAL HIGH (ref 70–99)
Glucose-Capillary: 130 mg/dL — ABNORMAL HIGH (ref 70–99)
Glucose-Capillary: 163 mg/dL — ABNORMAL HIGH (ref 70–99)
Glucose-Capillary: 78 mg/dL (ref 70–99)

## 2021-04-27 MED ORDER — AMLODIPINE BESYLATE 5 MG PO TABS
5.0000 mg | ORAL_TABLET | Freq: Every day | ORAL | Status: DC
  Administered 2021-04-27: 5 mg via ORAL
  Filled 2021-04-27: qty 1

## 2021-04-27 NOTE — Progress Notes (Signed)
Modified Barium Swallow Progress Note  Patient Details  Name: Jeffery Haynes MRN: [id number] Date of Birth: 1931/03/16  Today's Date: 04/27/2021  Modified Barium Swallow completed.  Full report located under Chart Review in the Imaging Section.  Brief recommendations include the following:  Clinical Impression  Pt demonstrates a moderate to severe oropharyngeal dysphagia with high risk of aspiration regardless of texture modifcation or strategies utilized. Pt has significant cognitive deficits leading to rapid intake of large boluses even with total assisted feeding. Pt has delayed swallow initiation as well as oral and base of tongue residuals due to weakness. Residuals spill and pool in pyriforms and are silently aspirated after the swallow or during subsequent swallows. Pt has an inconsistent, very delayed cough that does not expel aspirate. Both thickened and thin liquids pose risk of aspiration and so modifying liquid texture is not recommended. Pt could benefit from trained caregivers to assist in feeding using a slow rate of intake with a straw, encouraging a chin tucked position and also intermittent throat clearing to eject penetrate/aspirate. Also implementing a thorough oral care regimen will reduce oral bacterial load and improve pulmonary hygiene. Would continue to recommend a mechanical soft/thin liquids diet with the above strategies.   Swallow Evaluation Recommendations       SLP Diet Recommendations: Dysphagia 3 (Mech soft) solids;Thin liquid   Liquid Administration via: Straw   Medication Administration: Crushed with puree   Supervision: Full assist for feeding;Full supervision/cueing for compensatory strategies   Compensations: Slow rate;Clear throat intermittently;Clear throat after each swallow;Multiple dry swallows after each bite/sip   Postural Changes: Remain semi-upright after after feeds/meals (Comment);Seated upright at 90 degrees   Oral Care Recommendations:  Oral care QID     Jeffery [place], MA CCC-SLP  Acute Rehabilitation Services Office [phone]    Lynann Beaver 04/27/2021,2:22 PM

## 2021-04-27 NOTE — Care Management Important Message (Signed)
Important Message  Patient Details  Name: Jeffery Haynes MRN: [id number] Date of Birth: October 18, 1931   Medicare Important Message Given:  Yes     Promiss Labarbera Stefan Church 04/27/2021, 3:41 PM

## 2021-04-27 NOTE — Evaluation (Signed)
Occupational Therapy Evaluation Patient Details Name: Jeffery Haynes MRN: [id number] DOB: [birth year] Today's Date: 04/27/2021   History of Present Illness Jeffery Haynes is a 88 y.o. male with medical history significant of advanced dementia, IDDM with left sided residual weakness, HTN, BPH, who was sent from nursing home for altered mentation.   Clinical Impression   Jeffery Haynes was evaluated s/p the above admission list, he is from SNF. Upon evaluation pt was able to state his name and birthday with increased time, otherwise not oriented and unable to follow simple commands. Likely near baseline due to advanced dementia. Overall pt requires max A +2 - total A for all aspects of care at bed/EOB level. He presents with a severe posterior lean in sitting. Pt will benefit from OT acutely to work on self feeding, grooming and EOB tasks. Recommend d/c back to SNF.     Recommendations for follow up therapy are one component of a multi-disciplinary discharge planning process, led by the attending physician.  Recommendations may be updated based on patient status, additional functional criteria and insurance authorization.   Follow Up Recommendations  Skilled nursing-short term rehab (<3 hours/day)    Assistance Recommended at Discharge Frequent or constant Supervision/Assistance     Functional Status Assessment  Patient has had a recent decline in their functional status and/or demonstrates limited ability to make significant improvements in function in a reasonable and predictable amount of time  Equipment Recommendations  None recommended by OT       Precautions / Restrictions Precautions Precautions: Fall Precaution Comments: advanced dementia, rigid Restrictions Weight Bearing Restrictions: No      Mobility Bed Mobility Overal bed mobility: Needs Assistance Bed Mobility: Sit to Supine, Supine to Sit     Supine to sit: Total assist, +2 for physical assistance, HOB elevated Sit  to supine: Total assist, +2 for physical assistance   General bed mobility comments: pt would briefly initiated movement of R LE towards EOB with freq verbal cues but ultimately required totalAX2 to achieve transfer to EOB, pt with rigidity requiring tactile cues at hip flexors to complete hip flexion to allow for sitting position. Pt with strong posterior, R lateral lean requiring maxA to maintain EOB balance    Transfers Overall transfer level: Needs assistance Equipment used: 2 person hand held assist Transfers: Sit to/from Stand Sit to Stand: Max assist, +2 physical assistance           General transfer comment: with tactile and verbal cues pt did lean forward and initiated standing up. pt maxAX2 to power up into standing. PT and OT completed 2 1/2 stand to help scoot pt up to [facility]. pt unable to step. tactle cues to trunk and blocking of bilat knees to achieve full upright standing      Balance Overall balance assessment: Needs assistance Sitting-balance support: Feet supported, Bilateral upper extremity supported Sitting balance-Leahy Scale: Zero Sitting balance - Comments: pt with strong posterior, R lateral lean requiring maxA to maintain EOB sitting, pt would react to tactile cues at hip flexor and hamstrings however couldn't maintain, pt with no sense of positional awareness Postural control: Posterior lean, Right lateral lean   Standing balance-Leahy Scale: Zero                             ADL either performed or assessed with clinical judgement   ADL Overall ADL's : Needs assistance/impaired Eating/Feeding: Maximal assistance;Bed level   Grooming: Maximal assistance;Sitting Grooming  Details (indicate cue type and reason): place tooth brush in pts hand, he said "comb." no attempt to brush teeth. Warm cloth placed, max A to wash face. Upper Body Bathing: Maximal assistance;Bed level   Lower Body Bathing: Maximal assistance;Bed level   Upper Body Dressing :  Maximal assistance;Sitting   Lower Body Dressing: Maximal assistance;Bed level   Toilet Transfer: Maximal assistance;+2 for physical assistance;+2 for safety/equipment   Toileting- Clothing Manipulation and Hygiene: Total assistance;Bed level       Functional mobility during ADLs: Maximal assistance;+2 for physical assistance;+2 for safety/equipment General ADL Comments: posterior lean in sitting, very rigid. ADLs at bed level. Pt was max A +2 for sit<>stand with minimal active participation     Vision Baseline Vision/History: 0 No visual deficits Ability to See in Adequate Light: 0 Adequate Patient Visual Report: No change from baseline Vision Assessment?: No apparent visual deficits Additional Comments: difficult to assess            Pertinent Vitals/Pain Pain Assessment Pain Assessment: Faces Faces Pain Scale: Hurts a little bit Pain Location: pt stated "my back" when asked Pain Descriptors / Indicators: Moaning Pain Intervention(s): Limited activity within patient's tolerance, Monitored during session     Hand Dominance Right   Extremity/Trunk Assessment Upper Extremity Assessment Upper Extremity Assessment: Generalized weakness;Difficult to assess due to impaired cognition (Actively resisting PROM wtih good strength, not following commands well enough to assess functionally)   Lower Extremity Assessment Lower Extremity Assessment: Defer to PT evaluation   Cervical / Trunk Assessment Cervical / Trunk Assessment: Other exceptions Cervical / Trunk Exceptions: forward head   Communication Communication Communication: Expressive difficulties   Cognition Arousal/Alertness: Awake/alert Behavior During Therapy: Flat affect Overall Cognitive Status: History of cognitive impairments - at baseline         General Comments: pt with known advanced dementia. Pt oriented to name and birthdate but slow response. Pt with noted poor attention span as pt would initiate task  ask briefly but couldn't carry out     General Comments  VSS on RA            Home Living Family/patient expects to be discharged to:: Skilled nursing facility         Additional Comments: resident of local SNF      Prior Functioning/Environment Prior Level of Function : Needs assist;Patient poor historian/Family not available             Mobility Comments: per chart pt was bed bound ADLs Comments: per chart pt dependent for ADLs but was able to feed self        OT Problem List: Decreased strength;Decreased activity tolerance;Decreased range of motion;Impaired balance (sitting and/or standing);Decreased cognition;Decreased safety awareness;Decreased knowledge of use of DME or AE      OT Treatment/Interventions: Therapeutic exercise;Self-care/ADL training;DME and/or AE instruction;Therapeutic activities;Balance training;Patient/family education    OT Goals(Current goals can be found in the care plan section) Acute Rehab OT Goals Patient Stated Goal: unable to state OT Goal Formulation: With patient Time For Goal Achievement: 05/11/21 Potential to Achieve Goals: Fair ADL Goals Pt Will Perform Eating: with set-up;with supervision;sitting Pt Will Perform Grooming: with set-up;with supervision;sitting Additional ADL Goal #1: pt will complete bed mobility with min A as a precursor to EOB ADLs  OT Frequency: Min 2X/week    Co-evaluation PT/OT/SLP Co-Evaluation/Treatment: Yes Reason for Co-Treatment: For patient/therapist safety;To address functional/ADL transfers PT goals addressed during session: Mobility/safety with mobility OT goals addressed during session: ADL's and self-care  AM-PAC OT "6 Clicks" Daily Activity     Outcome Measure Help from another person eating meals?: A Lot Help from another person taking care of personal grooming?: A Lot Help from another person toileting, which includes using toliet, bedpan, or urinal?: Total Help from another person  bathing (including washing, rinsing, drying)?: A Lot Help from another person to put on and taking off regular upper body clothing?: A Lot Help from another person to put on and taking off regular lower body clothing?: A Lot 6 Click Score: 11   End of Session Equipment Utilized During Treatment: Gait belt Nurse Communication: Mobility status  Activity Tolerance: Patient tolerated treatment well Patient left: in bed;with bed alarm set;with call bell/phone within reach  OT Visit Diagnosis: Other abnormalities of gait and mobility (R26.89);Muscle weakness (generalized) (M62.81);History of falling (Z91.81)                Time: 9326-7124 OT Time Calculation (min): 16 min Charges:  OT General Charges $OT Visit: 1 Visit OT Evaluation $OT Eval Moderate Complexity: 1 Mod   Jeffery Haynes 04/27/2021, 9:53 AM

## 2021-04-27 NOTE — Progress Notes (Signed)
[facility]                                                                               Jeffery Haynes, is a 88 y.o. male, DOB - 07/03/1931, [id number] Admit date - 04/22/2021    Outpatient Primary MD for the patient is Jeffery Brothers, MD  LOS - 5  days    Brief summary   Jeffery Haynes is a 88 y.o. male with medical history significant of advanced dementia, IDDM with left sided residual weakness, HTN, BPH, who was sent from nursing home for altered mentation. Found febrile temperature 100.9, tachycardia, blood pressure elevated, no hypoxia.  WBC 14.3, COVID positive and CT chest abdomen pelvis showed acute no acute findings.  UA showed no nitrite no leukocyte.   ED discussed with patient and family regarding lumbar puncture, family declined.   Neurology was consulted who ordered MRI.  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  Moderate stenosis of right M1 segment and severe stenosis inferior division right MCA. Pts mental status improved. He is more alert and has taken liquid diet with SLP.  He is swallowing better, and currently on dysphagia 3 diet. Therapy eval recommending SNF.  Pt stable for discharge.    Assessment & Plan    Assessment and Plan: Sepsis ([place])- (present on admission) Sepsis ruled out.  Pt was febrile,  had leukocytosis and AMS on admission. He is more awake right now, getting closer to baseline. No source of infection found so far.  He was started on broad spectrum IV Antibiotics on admission., in view of his improving mental status and negative cultures, we have discontinued antibiotics. He continues to improve. No fever .      Acute metabolic encephalopathy- (present on admission) Suspect secondary to COVID 19 infection or COVID encephalopathy/ decreased po intake. LP was recommended but pt 's family refused. He is more awake right now, not following commands, able to say one word answers.  Able to take full liquid diet without any issues.  SLP eval advanced diet to dysphagia 3 diet.     Hypomagnesemia Replaced. Repeat level wnl.   Seizure ([place]) None this admission.  Resume depakote in the oral form as he is able to take by mouth.   Diabetes mellitus type 2 in nonobese (HCC) Hemoglobin A1c is 7.4%  Currently on SSI.  CBG (last 3)  Recent Labs    04/23/21 0228 04/23/21 0800 04/23/21 1148  GLUCAP 182* 157* 112*   No change in meds.   Essential hypertension- (present on admission) Not well controlled this am.  Restarted home BP meds. And prn hydralazine.    Bradycardia:  - improved after stopping the labetalol.   Alzheimer's dementia ([place])- (present on admission) Dementia ([place])- (present on admission) No agitation seen today. Continue with home meds.   COVID 19 infection: -completed 3 days of remdesevir.     Estimated body mass index is 19.43 kg/m as calculated from the following:   Height as of 12/27/20: 6' (1.829 m).   Weight as of 12/27/20: 65 kg.  Code Status: DNR DVT Prophylaxis:  heparin injection 5,000 Units Start: 04/22/21 2200  Level of Care: Level of care: Med-Surg Family Communication: none at bedside.   Disposition Plan:     Remains inpatient appropriate:  UNSAFE D/C PLAN.   Procedures:  MRI brain   Consultants:   Neurology Palliative care.   Antimicrobials:  Antibiotics Given (last 72 hours)     Date/Time Action Medication Dose Rate   04/24/21 1241 New Bag/Given  [dose time moved due to compatibility with other IV meds not tested.]   remdesivir 100 mg in sodium chloride 0.9 % 100 mL IVPB 100 mg 200 mL/hr       Medications  Scheduled Meds:  citalopram  15 mg Oral q AM   divalproex  500 mg Oral Q12H   donepezil  5 mg Oral QHS   heparin  5,000 Units Subcutaneous Q12H   insulin aspart  0-15 Units Subcutaneous TID WC   polyethylene glycol  17 g Oral Daily   senna-docusate  2 tablet Oral BID   tamsulosin  0.4 mg  Oral QHS   Continuous Infusions:   PRN Meds:.acetaminophen, food thickener, haloperidol lactate, hydrALAZINE, traZODone    Subjective:   Jeffery Haynes was seen and examined today.  Appears comfortable. One word answers.    Objective:   Vitals:   04/26/21 2312 04/27/21 0322 04/27/21 0740 04/27/21 1200  BP: (!) 147/87 (!) 159/80 (!) 146/77 (!) 193/103  Pulse: 70 68 (!) 42 71  Resp: 16 18 18 18   Temp: 97.9 F (36.6 C) 97.7 F (36.5 C) 98.3 F (36.8 C) 98.3 F (36.8 C)  TempSrc: Oral Oral    SpO2: 100% 100% 100% 100%    Intake/Output Summary (Last 24 hours) at 04/27/2021 1229 Last data filed at 04/27/2021 1033 Gross per 24 hour  Intake 720 ml  Output 1000 ml  Net -280 ml    There were no vitals filed for this visit.   Exam General exam: Appears calm and comfortable  Respiratory system: Clear to auscultation. Respiratory effort normal. Cardiovascular system: S1 & S2 heard, RRR. No JVD, No pedal edema. Gastrointestinal system: Abdomen is nondistended, soft and nontender. Normal bowel sounds heard. Central nervous system: Alert , confused, able to say one word answers.  Extremities: Symmetric 5 x 5 power. Skin: No rashes, lesions or ulcers Psychiatry: not agitated.       Data Reviewed:  I have personally reviewed following labs and imaging studies   CBC Lab Results  Component Value Date   WBC 6.2 04/26/2021   RBC 3.74 (L) 04/26/2021   HGB 10.7 (L) 04/26/2021   HCT 33.6 (L) 04/26/2021   MCV 89.8 04/26/2021   MCH 28.6 04/26/2021   PLT 210 04/26/2021   MCHC 31.8 04/26/2021   RDW 14.3 04/26/2021   LYMPHSABS 1.6 04/26/2021   MONOABS 0.6 04/26/2021   EOSABS 0.1 04/26/2021   BASOSABS 0.0 [id number]     Last metabolic panel Lab Results  Component Value Date   NA 136 04/26/2021   K 3.7 04/26/2021   CL 101 04/26/2021   CO2 27 04/26/2021   BUN 18 04/26/2021   CREATININE 0.81 04/26/2021   GLUCOSE 112 (H) 04/26/2021   GFRNONAA >60 04/26/2021    GFRAA >60 09/28/2019   CALCIUM 8.5 (L) 04/26/2021   PHOS 2.7 04/26/2021   PROT 5.2 (L) 04/26/2021   ALBUMIN 2.8 (L) 04/26/2021   BILITOT 0.6 04/26/2021   ALKPHOS 62 04/26/2021   AST 13 (L) 04/26/2021   ALT 12 04/26/2021   ANIONGAP 8 04/26/2021    CBG (last 3)  Recent Labs    04/27/21 0557 04/27/21 0814 04/27/21 1154  GLUCAP 130* 132* 130*       Coagulation Profile: Recent Labs  Lab 04/22/21 1415  INR 1.0      Radiology Studies: No results found.      Hosie Poisson M.D. [facility] 04/27/2021, 12:29 PM  Available via Epic secure chat 7am-7pm After 7 pm, please refer to night coverage provider listed on amion.

## 2021-04-27 NOTE — Progress Notes (Signed)
° °  Palliative Medicine Inpatient Follow Up Note  HPI:  88 y.o. male  with past medical history of IDDM, stroke with left residual weakness, HTN, and BPH admitted on 04/22/2021 from [facility] home with altered mental status.  Patient found to be COVID-positive with fever, tachycardia, and high blood pressure. (+) Acute Stroke. SLP --> unable to clear secretions high aspiration risk.   Palliative care was asked to get involved to establish goals with patient's family in the setting of a significant.  Today's Discussion (04/27/2021):  *Please note that this is a verbal dictation therefore any spelling or grammatical errors are due to the "[employer]" system interpretation.  Chart reviewed inclusive of vital signs, progress notes, laboratory results, and diagnostic images.   Reviewed the improvements that patient has had since admission.  I called patients spouse this morning, [name]. We reviewed that he is presently improved. She shares that she too was aware of this per her conversations over the phone with him. She states that he is not "quite" back to his baseline but is very close.  Reviewed that [facility] is hopeful Charron can get to [facility] nursing home in [place]. She shares that this is closer to where she lives and easier to transfer to. We reviewed that she feels many incidents/hospitalizations from [place] where he has lived for the past three year have been in the setting of over medication. She expresses that she worries this will happen again moving forward. I shared that I am not in charge of this process though I will reach out to the [facility] team to better support her concerns.  Otherwise, confirmed the plan for Shawan to go to rehabilitation then back to custodial care.   Questions and concerns addressed   Palliative Support Provided.   Objective Assessment: Vital Signs Vitals:   04/27/21 0322 04/27/21 0740  BP: (!) 159/80 (!) 146/77   Pulse: 68 (!) 42  Resp: 18 18  Temp: 97.7 F (36.5 C) 98.3 F (36.8 C)  SpO2: 100% 100%    Intake/Output Summary (Last 24 hours) at 04/27/2021 1131 Last data filed at 04/27/2021 1033 Gross per 24 hour  Intake 720 ml  Output 1650 ml  Net -930 ml    Gen: Frail elderly African-American male in no acute distress HEENT: moist mucous membranes CV: Regular rate and rhythm PULM: On room air breathing is even and nonlabored ABD: soft/nontender EXT: No edema Neuro: Awake and alert to self  SUMMARY OF RECOMMENDATIONS   DNAR/DNI  Has improved - ongoing supportive care  Skilled nursing placement --> Lives at [facility] though wife concerned about the care there. TOC informed.  Ongoing palliative support at this time  MDM - Moderate  ______________________________________________________________________________________ [place] Team Team Cell Phone: [phone] Please utilize secure chat with additional questions, if there is no response within 30 minutes please call the above phone number  Palliative Medicine Team providers are available by phone from 7am to 7pm daily and can be reached through the team cell phone.  Should this patient require assistance outside of these hours, please call the patient's attending physician.

## 2021-04-27 NOTE — TOC Progression Note (Signed)
Transition of Care [facility]) - Progression Note    Patient Details  Name: Jeffery Haynes MRN: [id number] Date of Birth: 1931/06/30  Transition of Care [facility]) CM/SW Contact  Carley Hammed, [place] Phone Number: 04/27/2021, 4:00 PM  Clinical Narrative:    CSW set up transportation with [facility] for 10 AM. MD, RN and primary CSW notified. Attempted to update pt's spouse, VM was left. TOC will continue to follow.        Expected Discharge Plan and Services                                                 Social Determinants of Health (SDOH) Interventions    Readmission Risk Interventions No flowsheet data found.

## 2021-04-27 NOTE — Progress Notes (Signed)
Speech Language Pathology Treatment: Dysphagia  Patient Details Name: Jeffery Haynes MRN: [id number] DOB: 1931/04/09 Today's Date: 04/27/2021 Time: [id number] SLP Time Calculation (min) (ACUTE ONLY): 15 min  Assessment / Plan / Recommendation Clinical Impression  Jeffery Haynes was seen at bedside for dysphagia treatment. He was alert and cooperative throughout the entirety of the session. SLP observed PO intake with thin liquids via straw cup, as well as puree applesauce and regular solids (graham cracker). Pt tolerated consecutive sips of thin liquids and individual bites of various textures without immediate overt s/sx of aspiration. Pt adequately accepted all textures; Mastication was [facility] and oral clearance adequate. He exhibited delayed coughing and throat clearing at the end of the session. SLP is recommending MBS for further instrumental testing of swallowing function. Pt to remain on Dys 3 and Nectar Thick Liquids pending MBS.  HPI HPI: Pt is an 88 yo male presenting from SNF with AMS. COVID positive. CT Chest and MRI Brain without acute findings. Previous clinical swallowing evaluations in March and October 2022 both revealed relatively functional swallowing despite cognitive deficits with regular solids and thin liquids recommended (meds crushed in puree per family). PMH includes: advanced dementia, stroke with residual L sided weakness, DM, HTN, BPH      SLP Plan  MBS;New goals to be determined pending instrumental study      Recommendations for follow up therapy are one component of a multi-disciplinary discharge planning process, led by the attending physician.  Recommendations may be updated based on patient status, additional functional criteria and insurance authorization.    Recommendations  Diet recommendations: Regular;Nectar-thick liquid Liquids provided via: Straw;Cup Medication Administration: Whole meds with puree Supervision: Staff to assist with self feeding;Full  supervision/cueing for compensatory strategies Compensations: Small sips/bites;Slow rate Postural Changes and/or Swallow Maneuvers: Seated upright 90 degrees;Upright 30-60 min after meal                Oral Care Recommendations: Oral care BID Follow Up Recommendations: Skilled nursing-short term rehab (<3 hours/day) Assistance recommended at discharge: Frequent or constant Supervision/Assistance SLP Visit Diagnosis: Dysphagia, unspecified (R13.10) Plan: MBS;New goals to be determined pending instrumental study           Jeffery Haynes  04/27/2021, 9:53 AM

## 2021-04-27 NOTE — Evaluation (Signed)
Physical Therapy Evaluation Patient Details Name: Jeffery Haynes MRN: [id number] DOB: 05/30/1931 Today's Date: 04/27/2021  History of Present Illness  Jeffery Haynes is a 88 y.o. male with medical history significant of advanced dementia, IDDM with left sided residual weakness, HTN, BPH, who was sent from nursing home for altered mentation.   Clinical Impression  Pt admitted with above. No family present, PLOF taken from chart. Pt with known advanced dementia and is only oriented to self and birth date. Pt appears to be near baseline level of function. Pt requiring maxAx2 for transfer to EOB and to attempt standing. Pt remains appropriate to return to SNF of residence once medical stable. Acute PT to follow.       Recommendations for follow up therapy are one component of a multi-disciplinary discharge planning process, led by the attending physician.  Recommendations may be updated based on patient status, additional functional criteria and insurance authorization.  Follow Up Recommendations Skilled nursing-short term rehab (<3 hours/day) (return to facility of residence)    Assistance Recommended at Discharge Frequent or constant Supervision/Assistance  Patient can return home with the following       Equipment Recommendations  (facility has needed equip for pt)  Recommendations for Other Services       Functional Status Assessment       Precautions / Restrictions Precautions Precautions: Fall Precaution Comments: advanced dementia, rigid Restrictions Weight Bearing Restrictions: No      Mobility  Bed Mobility Overal bed mobility: Needs Assistance Bed Mobility: Sit to Supine, Supine to Sit     Supine to sit: Total assist, +2 for physical assistance, HOB elevated Sit to supine: Total assist, +2 for physical assistance   General bed mobility comments: pt would briefly initiated movement of R LE towards EOB with freq verbal cues but ultimately required totalAX2 to  achieve transfer to EOB, pt with rigidity requiring tactile cues at hip flexors to complete hip flexion to allow for sitting position. Pt with strong posterior, R lateral lean requiring maxA to maintain EOB balance    Transfers Overall transfer level: Needs assistance Equipment used: 2 person hand held assist (2 person face to face lift with gait belt and bed pad) Transfers: Sit to/from Stand Sit to Stand: Max assist, +2 physical assistance           General transfer comment: with tactile and verbal cues pt did lean forward and initiated standing up. pt maxAX2 to power up into standing. PT and OT completed 2 1/2 stand to help scoot pt up to [facility]. pt unable to step. tactle cues to trunk and blocking of bilat knees to achieve full upright standing    Ambulation/Gait               General Gait Details: unable  Stairs            Wheelchair Mobility    Modified Rankin (Stroke Patients Only) Modified Rankin (Stroke Patients Only) Pre-Morbid Rankin Score: Severe disability Modified Rankin: Severe disability     Balance Overall balance assessment: Needs assistance Sitting-balance support: Feet supported, Bilateral upper extremity supported Sitting balance-Leahy Scale: Zero Sitting balance - Comments: pt with strong posterior, R lateral lean requiring maxA to maintain EOB sitting, pt would react to tactile cues at hip flexor and hamstrings however couldn't maintain, pt with no sense of positional awareness Postural control: Posterior lean, Right lateral lean  Pertinent Vitals/Pain Pain Assessment Pain Assessment: Faces Faces Pain Scale: Hurts a little bit Pain Location: pt stated "my back" when asked Pain Descriptors / Indicators: Moaning Pain Intervention(s): Limited activity within patient's tolerance    Home Living Family/patient expects to be discharged to:: Skilled nursing facility                    Additional Comments: resident of local SNF    Prior Function Prior Level of Function : Needs assist;Patient poor historian/Family not available             Mobility Comments: per chart pt was bed bound ADLs Comments: per chart pt dependent for ADLs but was able to feed self     Hand Dominance   Dominant Hand: Right    Extremity/Trunk Assessment   Upper Extremity Assessment Upper Extremity Assessment: Defer to OT evaluation    Lower Extremity Assessment Lower Extremity Assessment: Generalized weakness;Difficult to assess due to impaired cognition (pt very rigid, reluctant to flex at hips and knees. Pt with noted L residual weakness from previous stroke)    Cervical / Trunk Assessment Cervical / Trunk Assessment: Other exceptions (forward head) Cervical / Trunk Exceptions: forward head  Communication   Communication: Expressive difficulties  Cognition Arousal/Alertness: Awake/alert Behavior During Therapy: Flat affect Overall Cognitive Status: History of cognitive impairments - at baseline                                 General Comments: pt with known advanced dementia. Pt oriented to name and birthdate but slow response. Pt with noted poor attention span as pt would initiate task ask briefly but couldn't carry out        General Comments General comments (skin integrity, edema, etc.): VSS, pt soiled with urine, RN tech notified    Exercises     Assessment/Plan    PT Assessment Patient needs continued PT services  PT Problem List Decreased strength;Decreased activity tolerance;Decreased balance;Decreased mobility;Decreased coordination;Decreased cognition;Decreased knowledge of use of DME;Decreased safety awareness;Decreased knowledge of precautions       PT Treatment Interventions Functional mobility training;Therapeutic activities;Therapeutic exercise;Balance training;Neuromuscular re-education;Cognitive remediation    PT Goals (Current goals  can be found in the Care Plan section)  Acute Rehab PT Goals PT Goal Formulation: Patient unable to participate in goal setting Time For Goal Achievement: 05/11/21 Potential to Achieve Goals: Fair    Frequency Min 2X/week     Co-evaluation PT/OT/SLP Co-Evaluation/Treatment: Yes Reason for Co-Treatment: To address functional/ADL transfers PT goals addressed during session: Mobility/safety with mobility         AM-PAC PT "6 Clicks" Mobility  Outcome Measure Help needed turning from your back to your side while in a flat bed without using bedrails?: Total Help needed moving from lying on your back to sitting on the side of a flat bed without using bedrails?: Total Help needed moving to and from a bed to a chair (including a wheelchair)?: Total Help needed standing up from a chair using your arms (e.g., wheelchair or bedside chair)?: Total Help needed to walk in hospital room?: Total Help needed climbing 3-5 steps with a railing? : Total 6 Click Score: 6    End of Session Equipment Utilized During Treatment: Gait belt Activity Tolerance: Patient tolerated treatment well Patient left: in bed;with call bell/phone within reach;with bed alarm set (in chair position) Nurse Communication: Mobility status (notified RN tech pt was soiled)  PT Visit Diagnosis: Unsteadiness on feet (R26.81);Muscle weakness (generalized) (M62.81);Difficulty in walking, not elsewhere classified (R26.2)    Time: [id number] PT Time Calculation (min) (ACUTE ONLY): 16 min   Charges:   PT Evaluation $PT Eval Moderate Complexity: 1 Mod          Kittie Plater, PT, DPT Acute Rehabilitation Services Pager #: [phone] Office #: [phone]   Berline Lopes 04/27/2021, 9:36 AM

## 2021-04-27 NOTE — NC FL2 (Signed)
[name] LEVEL OF CARE SCREENING TOOL     IDENTIFICATION  Patient Name: Jeffery Haynes Birthdate: 01/02/1932 Sex: male Admission Date (Current Location): 04/22/2021  [facility] and [place] Number:  Herbalist and Address:  [facility] [place]. [facility], [place] [address], [place], [place] [address]      Provider Number: [id number]  Attending Physician Name and Address:  Hosie Poisson, MD  Relative Name and Phone Number:       Current Level of Care: Hospital Recommended Level of Care: [place] Prior Approval Number:    Date Approved/Denied:   PASRR Number: [id number]  Discharge Plan: SNF    Current Diagnoses: Patient Active Problem List   Diagnosis Date Noted   Sinus bradycardia 04/25/2021   Hypomagnesemia 04/23/2021   Sepsis ([place]) 04/23/2021   AMS (altered mental status) 04/22/2021   Seizure ([place]) 12/13/2020   Alzheimer's dementia ([place]) 06/07/2020   Fracture of femoral neck, left, closed ([place]) 06/07/2020   Closed fracture of left distal femur ([place]) 06/07/2020   Essential hypertension 06/07/2020   Diabetes mellitus type 2 in nonobese ([employer]) 06/07/2020   Fracture of femoral neck, left ([place]) [id number]   Acute metabolic encephalopathy [id number]   Atrial fibrillation with RVR ([place]) 01/29/2020   Alzheimer disease ([place])    Dementia ([place])     Orientation RESPIRATION BLADDER Height & Weight     Self  Normal Incontinent Weight:   Height:     BEHAVIORAL SYMPTOMS/MOOD NEUROLOGICAL BOWEL NUTRITION STATUS    Convulsions/Seizures Incontinent Diet (see DC summary)  AMBULATORY STATUS COMMUNICATION OF NEEDS Skin   Extensive Assist Verbally Skin abrasions (skin tear, left elbow, foam dressing: lift every shift to assess and change PRN)                       Personal Care Assistance Level of Assistance  Bathing, Feeding, Dressing Bathing Assistance: Maximum assistance Feeding assistance: Maximum assistance Dressing  Assistance: Maximum assistance     Functional Limitations Info  Speech     Speech Info: Impaired    SPECIAL CARE FACTORS FREQUENCY  PT (By licensed PT), OT (By licensed OT), Speech therapy     PT Frequency: 5x/wk OT Frequency: 5x/wk     Speech Therapy Frequency: 5x/wk      Contractures Contractures Info: Not present    Additional Factors Info  Code Status, Allergies, Psychotropic, Insulin Sliding Scale Code Status Info: DNR Allergies Info: Olanzapine Psychotropic Info: Celexa 15mg  daily; Aricept 5mg  daily at bed Insulin Sliding Scale Info: see DC summary       Current Medications (04/27/2021):  This is the current hospital active medication list Current Facility-Administered Medications  Medication Dose Route Frequency Provider Last Rate Last Admin   acetaminophen (TYLENOL) suppository 650 mg  650 mg Rectal Q6H PRN Opyd, Ilene Qua, MD   650 mg at 04/23/21 0029   citalopram (CELEXA) tablet 15 mg  15 mg Oral q AM Lequita Halt, MD   15 mg at 04/27/21 0644   divalproex (DEPAKOTE) DR tablet 500 mg  500 mg Oral Q12H Hosie Poisson, MD   500 mg at 04/27/21 1044   donepezil (ARICEPT) tablet 5 mg  5 mg Oral QHS Wynetta Fines T, MD   5 mg at 04/26/21 2053   food thickener (SIMPLYTHICK (NECTAR/LEVEL 2/MILDLY THICK)) 1 packet  1 packet Oral PRN Hosie Poisson, MD       haloperidol lactate (HALDOL) injection 1 mg  1 mg Intravenous  Q6H PRN Hosie Poisson, MD   1 mg at 04/24/21 1329   heparin injection 5,000 Units  5,000 Units Subcutaneous Q12H Wynetta Fines T, MD   5,000 Units at 04/27/21 1043   hydrALAZINE (APRESOLINE) injection 10 mg  10 mg Intravenous Q6H PRN Hosie Poisson, MD       insulin aspart (novoLOG) injection 0-15 Units  0-15 Units Subcutaneous TID WC Lequita Halt, MD   2 Units at 04/27/21 0644   polyethylene glycol (MIRALAX / GLYCOLAX) packet 17 g  17 g Oral Daily Wynetta Fines T, MD   17 g at 04/27/21 1043   senna-docusate (Senokot-S) tablet 2 tablet  2 tablet Oral BID Wynetta Fines  T, MD   2 tablet at 04/26/21 1044   tamsulosin (FLOMAX) capsule 0.4 mg  0.4 mg Oral QHS Wynetta Fines T, MD   0.4 mg at 04/26/21 2053   traZODone (DESYREL) tablet 50 mg  50 mg Oral QHS PRN Lequita Halt, MD   50 mg at 04/26/21 2053     Discharge Medications: Please see discharge summary for a list of discharge medications.  Relevant Imaging Results:  Relevant Lab Results:   Additional Information SS#: [id number]; patient has received two covid vaccines and two boosters  Jeffery Haynes, [place]

## 2021-04-28 DIAGNOSIS — R4182 Altered mental status, unspecified: Secondary | ICD-10-CM

## 2021-04-28 LAB — RESP PANEL BY RT-PCR (FLU A&B, COVID) ARPGX2
Influenza A by PCR: NEGATIVE
Influenza B by PCR: NEGATIVE
SARS Coronavirus 2 by RT PCR: NEGATIVE

## 2021-04-28 LAB — GLUCOSE, CAPILLARY
Glucose-Capillary: 144 mg/dL — ABNORMAL HIGH (ref 70–99)
Glucose-Capillary: 177 mg/dL — ABNORMAL HIGH (ref 70–99)

## 2021-04-28 MED ORDER — AMLODIPINE BESYLATE 10 MG PO TABS
10.0000 mg | ORAL_TABLET | Freq: Every day | ORAL | Status: DC
  Administered 2021-04-28: 10 mg via ORAL
  Filled 2021-04-28: qty 1

## 2021-04-28 MED ORDER — HYDRALAZINE HCL 25 MG PO TABS
25.0000 mg | ORAL_TABLET | Freq: Three times a day (TID) | ORAL | Status: DC
  Administered 2021-04-28: 25 mg via ORAL
  Filled 2021-04-28: qty 1

## 2021-04-28 MED ORDER — HYDRALAZINE HCL 25 MG PO TABS: 25.0000 mg | ORAL_TABLET | Freq: Three times a day (TID) | ORAL | 0 refills | Status: AC

## 2021-04-28 MED ORDER — FOOD THICKENER (SIMPLYTHICK): 1.0000 | ORAL | Status: AC | PRN

## 2021-04-28 MED ORDER — AMLODIPINE BESYLATE 10 MG PO TABS: 10.0000 mg | ORAL_TABLET | Freq: Every day | ORAL | 0 refills | Status: AC

## 2021-04-28 NOTE — TOC Transition Note (Signed)
Transition of Care [facility]) - CM/SW Discharge Note   Patient Details  Name: Jeffery Haynes MRN: [id number] Date of Birth: [birth year]  [facility]) CM/SW Contact:  Geralynn Ochs, LCSW Phone Number: 04/28/2021, 9:28 AM   Clinical Narrative:   Nurse to call report to [phone].    Final next level of care: [place] Barriers to Discharge: Barriers Resolved   Patient Goals and CMS Choice        Discharge Placement              Patient chooses bed at:  ([facility]) Patient to be transferred to facility by: [place] Name of family member notified: [name] Patient and family notified of of transfer: 04/28/21  Discharge Plan and Services                                     Social Determinants of Health (SDOH) Interventions     Readmission Risk Interventions No flowsheet data found.

## 2021-04-28 NOTE — Discharge Summary (Signed)
Physician Discharge Summary   Patient: Jeffery Haynes MRN: [id number] DOB: Jul 31, 1931  Admit date:     04/22/2021  Discharge date: 04/28/21  Discharge Physician: Hosie Poisson   PCP: Garwin Brothers, MD   Recommendations at discharge:  Please follow up with PCP in one week Please follow up with outpatient palliative care services.   Discharge Diagnoses: Principal Problem:   AMS (altered mental status) Active Problems:   Acute metabolic encephalopathy   Sepsis ([place])   Dementia ([place])   Alzheimer's dementia ([place])   Essential hypertension   Diabetes mellitus type 2 in nonobese ([place])   Seizure ([place])   Hypomagnesemia   Sinus bradycardia    Hospital Course:  Yaasir Trotta is a 88 y.o. male with medical history significant of advanced dementia, IDDM with left sided residual weakness, HTN, BPH, who was sent from nursing home for altered mentation. Found febrile temperature 100.9, tachycardia, blood pressure elevated, no hypoxia.  WBC 14.3, COVID positive and CT chest abdomen pelvis showed acute no acute findings.  UA showed no nitrite no leukocyte.   ED discussed with patient and family regarding lumbar puncture, family declined.   Neurology was consulted who ordered MRI.  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  Moderate stenosis of right M1 segment and severe stenosis inferior division right MCA. Pts mental status improved. He is more alert and has taken liquid diet with SLP.  He is swallowing better, and currently on dysphagia 3 diet. Therapy eval recommending SNF.  Pt stable for discharge.    Assessment and Plan: Sepsis ([place])- (present on admission) Sepsis ruled out.  Pt was febrile,  had leukocytosis and AMS on admission. He is more awake right now, getting closer to baseline. No source of infection found so far.  He was started on broad spectrum IV Antibiotics on admission., in view of his improving mental status and negative  cultures, we have discontinued antibiotics. He continues to improve. No fever .          Acute metabolic encephalopathy- (present on admission) Suspect secondary to COVID 19 infection or COVID encephalopathy/ decreased po intake. LP was recommended but pt 's family refused. He is more awake right now, not following commands, able to say one word answers. Able to take full liquid diet without any issues.  SLP eval advanced diet to dysphagia 3 diet.        Hypomagnesemia Replaced. Repeat level wnl.    Seizure ([place]) None this admission.  Resume depakote in the oral form as he is able to take by mouth.    Diabetes mellitus type 2 in nonobese ([facility]) Hemoglobin A1c is 7.4%  Currently on SSI.  CBG (last 3)       Recent Labs    04/23/21 0228 04/23/21 0800 04/23/21 1148  GLUCAP 182* 157* 112*    No change in meds.    Essential hypertension- (present on admission) Started the patient on amlodipine and hydralazine.      Bradycardia:  - improved after stopping the labetalol.    Alzheimer's dementia ([place])- (present on admission) Dementia ([place])- (present on admission) No agitation seen today. Continue with home meds.    COVID 19 infection: -completed 3 days of remdesevir.    Estimated body mass index is 19.43 kg/m as calculated from the following:   Height as of 12/27/20: 6' (1.829 m).   Weight as of 12/27/20: 65 kg. Consultants: palliative care  Neurology. Procedures performed: none.   Disposition: Skilled  nursing facility Diet recommendation:  Discharge Diet Orders (From admission, onward)     Start     Ordered   04/28/21 0000  Diet - low sodium heart healthy        04/28/21 0838           Dysphagia type 3 nectar thick Liquid  DISCHARGE MEDICATION: Allergies as of 04/28/2021       Reactions   Olanzapine    Possible neuroleptic malignant syndrome. Pt has been tolerating at [facility] in [facility]22 for months.        Medication List     STOP taking these  medications    LORazepam 0.5 MG tablet Commonly known as: ATIVAN   traZODone 50 MG tablet Commonly known as: DESYREL       TAKE these medications    acetaminophen 325 MG tablet Commonly known as: TYLENOL Take 650 mg by mouth 4 (four) times daily.   amLODipine 10 MG tablet Commonly known as: NORVASC Take 1 tablet (10 mg total) by mouth daily. What changed:  medication strength how much to take when to take this   citalopram 10 MG tablet Commonly known as: CELEXA Take 15 mg by mouth in the morning.   divalproex 125 MG DR tablet Commonly known as: DEPAKOTE Take 500 mg by mouth every 12 (twelve) hours. What changed: Another medication with the same name was removed. Continue taking this medication, and follow the directions you see here.   donepezil 5 MG tablet Commonly known as: Aricept Take 1 tablet (5 mg total) by mouth at bedtime.   food thickener Gel Commonly known as: SIMPLYTHICK (NECTAR/LEVEL 2/MILDLY THICK) Take 1 packet by mouth as needed.   hydrALAZINE 25 MG tablet Commonly known as: APRESOLINE Take 1 tablet (25 mg total) by mouth every 8 (eight) hours.   insulin aspart 100 UNIT/ML injection Commonly known as: novoLOG Inject 0-9 Units into the skin 3 (three) times daily with meals. Sliding scale insulin Less than 70 initiate hypoglycemia protocol 70-120  0 units 120-150 1 unit 151-200 2 units 201-250 3 units 251-300 5 units 301-350 7 units 351-400 9 units  Greater than 400 call MD   lidocaine 5 % Commonly known as: LIDODERM Place 1 patch onto the skin daily. Remove & Discard patch within 12 hours or as directed by MD What changed:  when to take this additional instructions   metFORMIN 500 MG tablet Commonly known as: GLUCOPHAGE Take 500 mg by mouth 2 (two) times daily.   OLANZapine 2.5 MG tablet Commonly known as: ZYPREXA Take 2.5 mg by mouth See admin instructions. Take 1 tablet at bedtime every  Monday,Tuesday,Wednesday,Thursday and  saturday   polyethylene glycol powder 17 GM/SCOOP powder Commonly known as: GLYCOLAX/MIRALAX Take 17 g by mouth daily.   PRESCRIPTION MEDICATION Apply 1 application topically See admin instructions. Apply 1 ml to arm/neck topically in the afternoon for anxiety apply 1 ml to arms and legs   sennosides-docusate sodium 8.6-50 MG tablet Commonly known as: SENOKOT-S Take 2 tablets by mouth 2 (two) times daily.   tamsulosin 0.4 MG Caps capsule Commonly known as: FLOMAX Take 0.4 mg by mouth at bedtime.        Follow-up Information     Garwin Brothers, MD. Schedule an appointment as soon as possible for a visit in 1 week(s).   Specialty: Internal Medicine Contact information: [address] [place] [address] [phone]  Discharge Exam: There were no vitals filed for this visit. Vitals with BMI 04/28/2021 04/28/2021 04/28/2021  Height - - -  Weight - - -  BMI - - -  Systolic [id number]  Diastolic 96 80 85  Pulse [id number] 86 77   General exam: Appears calm and comfortable  Respiratory system: Clear to auscultation. Respiratory effort normal. Cardiovascular system: S1 & S2 heard, RRR. No JVD, . No pedal edema. Gastrointestinal system: Abdomen is nondistended, soft and nontender.. Normal bowel sounds heard. Central nervous system: Alert , baseline cognitive deficits.  Extremities: Symmetric 5 x 5 power. Skin: No rashes, lesions or ulcers Psychiatry:  Mood & affect appropriate.    Condition at discharge: fair  The results of significant diagnostics from this hospitalization (including imaging, microbiology, ancillary and laboratory) are listed below for reference.   Imaging Studies: CT ANGIO HEAD NECK W WO CM  Result Date: 04/22/2021 CLINICAL DATA:  Acute neuro deficit.  Suspect stroke. EXAM: CT ANGIOGRAPHY HEAD AND NECK TECHNIQUE: Multidetector CT imaging of the head and neck was performed using the standard protocol during bolus administration of  intravenous contrast. Multiplanar CT image reconstructions and MIPs were obtained to evaluate the vascular anatomy. Carotid stenosis measurements (when applicable) are obtained utilizing NASCET criteria, using the distal internal carotid diameter as the denominator. RADIATION DOSE REDUCTION: This exam was performed according to the departmental dose-optimization program which includes automated exposure control, adjustment of the mA and/or kV according to patient size and/or use of iterative reconstruction technique. CONTRAST:  159mL OMNIPAQUE IOHEXOL 350 MG/ML SOLN COMPARISON:  CT head 04/22/2021.  CT angio head and neck 12/12/2020 FINDINGS: CTA NECK FINDINGS Aortic arch: Standard branching. Imaged portion shows no evidence of aneurysm or dissection. No significant stenosis of the major arch vessel origins. Mild atherosclerotic disease aortic arch. Right carotid system: Mild atherosclerotic calcification right carotid bulb. Negative for right carotid stenosis Left carotid system: Mild atherosclerotic calcification left carotid bulb. Negative for stenosis Vertebral arteries: Moderate to severe stenosis at the origin of the vertebral artery bilaterally due to calcific plaque. Both vertebral arteries are then patent to the basilar. Skeleton: Multilevel cervical disc degeneration. No acute skeletal abnormality. Other neck: Negative for mass or adenopathy. Upper chest: Lung apices clear bilaterally. Review of the MIP images confirms the above findings CTA HEAD FINDINGS Anterior circulation: Atherosclerotic calcification throughout the cavernous carotid bilaterally causing mild to moderate stenosis bilaterally. 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 anterior cerebral artery infarct. Moderate stenosis proximal right M1 segment. Severe stenosis inferior division of right MCA. Mild disease in the superior division of the right MCA. Left middle cerebral artery patent with scattered mild atherosclerotic disease.  Posterior circulation: Both vertebral arteries patent to the basilar. PICA patent on the left. Right PICA appears supplied from the right AICA. Basilar widely patent. Superior cerebellar and posterior cerebral arteries patent bilaterally. Mild atherosclerotic disease in the posterior cerebral artery bilaterally. Venous sinuses: Normal venous enhancement Anatomic variants: None Review of the MIP images confirms the above findings IMPRESSION: 1. 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 anterior cerebral artery infarct. This is unchanged from the prior CTA. 2. Moderate stenosis right M1 segment. Severe stenosis inferior division right middle cerebral artery, this appeared occluded previously and has recanalized in the interval. 3. Mild atherosclerotic disease of the carotid bifurcation bilaterally without significant stenosis 4. Moderate to severe stenosis at the origin of the vertebral artery bilaterally. Electronically Signed   By: Franchot Gallo M.D.   On: 04/22/2021  16:16   CT HEAD WO CONTRAST (5MM)  Result Date: 04/22/2021 CLINICAL DATA:  Altered mental status. EXAM: CT HEAD WITHOUT CONTRAST TECHNIQUE: Contiguous axial images were obtained from the base of the skull through the vertex without intravenous contrast. RADIATION DOSE REDUCTION: This exam was performed according to the departmental dose-optimization program which includes automated exposure control, adjustment of the mA and/or kV according to patient size and/or use of iterative reconstruction technique. COMPARISON:  March 02, 2021. FINDINGS: Brain: Right parietal encephalomalacia is noted. Mild diffuse cortical atrophy is noted. Mild chronic ischemic white matter disease is noted. No mass effect or midline shift is noted. Ventricular size is within normal limits. There is no evidence of mass lesion, hemorrhage or acute infarction. Vascular: No hyperdense vessel or unexpected calcification. Skull: Normal. Negative for  fracture or focal lesion. Sinuses/Orbits: No acute finding. Other: None. IMPRESSION: No acute intracranial abnormality seen. Electronically Signed   By: Marijo Conception M.D.   On: 04/22/2021 14:37   MR BRAIN WO CONTRAST  Result Date: 04/23/2021 CLINICAL DATA:  Mental status change, unknown cause EXAM: MRI HEAD WITHOUT CONTRAST TECHNIQUE: Multiplanar, multiecho pulse sequences of the brain and surrounding structures were obtained without intravenous contrast. COMPARISON:  CT head April 22, 2021.  MRI December 13, 2020. FINDINGS: Brain: Large remote right ACA territory infarct with similar remote blood products and encephalomalacia. Additional patchy T2/FLAIR hyperintensity white matter, nonspecific compatible with chronic microvascular ischemic disease. No evidence of acute infarct, acute hemorrhage, mass lesion, midline shift, hydrocephalus, or extra-axial fluid collection. Chronic mega cisterna magna. Chronic microhemorrhage in the medial right temporal lobe. Moderate atrophy. Vascular: Major arterial flow voids are maintained at the skull base. Skull and upper cervical spine: Normal marrow signal. Sinuses/Orbits: Mild paranasal sinus mucosal thickening. Unremarkable orbits. Other: Small bilateral mastoid effusions. IMPRESSION: 1. No evidence of acute abnormality. 2. Large remote right ACA territory infarct. 3. Moderate chronic microvascular disease and cerebral atrophy (ICD10-G31.9). Electronically Signed   By: Margaretha Sheffield M.D.   On: 04/23/2021 09:29   CT CHEST ABDOMEN PELVIS W CONTRAST  Result Date: 04/22/2021 CLINICAL DATA:  Sepsis EXAM: CT CHEST, ABDOMEN, AND PELVIS WITH CONTRAST TECHNIQUE: Multidetector CT imaging of the chest, abdomen and pelvis was performed following the standard protocol during bolus administration of intravenous contrast. RADIATION DOSE REDUCTION: This exam was performed according to the departmental dose-optimization program which includes automated exposure control,  adjustment of the mA and/or kV according to patient size and/or use of iterative reconstruction technique. CONTRAST:  181mL OMNIPAQUE IOHEXOL 350 MG/ML SOLN COMPARISON:  None. FINDINGS: CT CHEST FINDINGS Cardiovascular: Normal heart size. No pericardial effusion. Left main and three-vessel coronary artery calcifications. Atherosclerotic disease of the thoracic aorta. Mediastinum/Nodes: Patulous esophagus. Thyroid is unremarkable. No pathologically enlarged lymph nodes seen in the chest. Lungs/Pleura: Central airways are patent. Linear opacities of the bilateral lower lobes which are likely due to scarring or atelectasis. No consolidation, pleural effusion or pneumothorax. Musculoskeletal: Old posterior left-sided rib fractures. No chest wall mass or suspicious bone lesions identified. CT ABDOMEN PELVIS FINDINGS Hepatobiliary: No focal liver abnormality is seen. No gallstones, gallbladder wall thickening, or biliary dilatation. Pancreas: Unremarkable. No pancreatic ductal dilatation or surrounding inflammatory changes. Spleen: Normal in size without focal abnormality. Adrenals/Urinary Tract: No hydronephrosis or nephrolithiasis. Renal vascular calcifications with no definite nephrolithiasis. Bladder wall thickening. Stomach/Bowel: Stomach is within normal limits. Appendix appears normal. Diverticulosis. No evidence of bowel wall thickening, distention, or inflammatory changes. Vascular/Lymphatic: Aortic atherosclerosis. No enlarged abdominal or pelvic lymph  nodes. Reproductive: Mild prostatomegaly. Other: Tiny fat containing umbilical hernia. No abdominopelvic ascites. Musculoskeletal: Unchanged mild compression deformities of the thoracic and lumbar spine. No acute or significant osseous findings. IMPRESSION: 1. No acute findings in the chest, abdomen or pelvis. 2.  Aortic Atherosclerosis (ICD10-I70.0). Electronically Signed   By: Yetta Glassman M.D.   On: 04/22/2021 16:10   DG Chest Port 1 View  Result Date:  04/22/2021 CLINICAL DATA:  Questionable sepsis EXAM: PORTABLE CHEST 1 VIEW COMPARISON:  12/17/2020 FINDINGS: Normal mediastinum and cardiac silhouette. Normal pulmonary vasculature. No evidence of effusion, infiltrate, or pneumothorax. No acute bony abnormality. IMPRESSION: No acute cardiopulmonary process. Electronically Signed   By: Suzy Bouchard M.D.   On: 04/22/2021 13:40   DG Swallowing Func-Speech Pathology  Result Date: 04/27/2021 Table formatting from the original result was not included. Objective Swallowing Evaluation: Type of Study: MBS-Modified Barium Swallow Study  Patient Details Name: Jawon Alvidrez MRN: [id number] Date of Birth: Aug 24, 1931 Today's Date: 04/27/2021 Time: SLP Start Time (ACUTE ONLY): 95 -SLP Stop Time (ACUTE ONLY): 1300 SLP Time Calculation (min) (ACUTE ONLY): 20 min Past Medical History: Past Medical History: Diagnosis Date  Alzheimer disease ([place])   Dementia ([place])   Diabetes mellitus without complication ([place])   Stroke [facility])  Past Surgical History: No past surgical history on file. HPI: Pt is an 88 yo male presenting from SNF with AMS. COVID positive. CT Chest and MRI Brain without acute findings. Previous clinical swallowing evaluations in March and October 2022 both revealed relatively functional swallowing despite cognitive deficits with regular solids and thin liquids recommended (meds crushed in puree per family). PMH includes: advanced dementia, stroke with residual L sided weakness, DM, HTN, BPH  Subjective: pt fully alert, does not follow directions but willing to consume po intake  Recommendations for follow up therapy are one component of a multi-disciplinary discharge planning process, led by the attending physician.  Recommendations may be updated based on patient status, additional functional criteria and insurance authorization. Assessment / Plan / Recommendation Clinical Impressions 04/27/2021 Clinical Impression Pt demonstrates a moderate to severe oropharyngeal  dysphagia with high risk of aspiration regardless of texture modifcation or strategies utilized. Pt has significant cognitive deficits leading to rapid intake of large boluses even with total assisted feeding. Pt has delayed swallow initiation as well as oral and base of tongue residuals due to weakness. Residuals spill and pool in pyriforms and are silently aspirated after the swallow or during subsequent swallows. Pt has an inconsistent, very delayed cough that does not expel aspirate. Both thickened and thin liquids pose risk of aspiration and so modifying liquid texture is not recommended. Pt could benefit from trained caregivers to assist in feeding using a slow rate of intake with a straw, encouraging a chin tucked position and also intermittent throat clearing to eject penetrate/aspirate. Also implementing a thorough oral care regimen will reduce oral bacterial load and improve pulmonary hygiene. Would continue to recommend a mechanical soft/thin liquids diet with the above strategies. SLP Visit Diagnosis Dysphagia, unspecified (R13.10) Attention and concentration deficit following -- Frontal lobe and executive function deficit following -- Impact on safety and function --   Treatment Recommendations 04/27/2021 Treatment Recommendations Therapy as outlined in treatment plan below   Prognosis 04/23/2021 Prognosis for Safe Diet Advancement Fair Barriers to Reach Goals Cognitive deficits Barriers/Prognosis Comment -- Diet Recommendations 04/27/2021 SLP Diet Recommendations Dysphagia 3 (Mech soft) solids;Thin liquid Liquid Administration via Straw Medication Administration Crushed with puree Compensations Slow rate;Clear throat intermittently;Clear throat after  each swallow;Multiple dry swallows after each bite/sip Postural Changes Remain semi-upright after after feeds/meals (Comment);Seated upright at 90 degrees   Other Recommendations 04/27/2021 Recommended Consults -- Oral Care Recommendations Oral care QID Other  Recommendations -- Follow Up Recommendations Skilled nursing-short term rehab (<3 hours/day) Assistance recommended at discharge Frequent or constant Supervision/Assistance Functional Status Assessment Patient has had a recent decline in their functional status and demonstrates the ability to make significant improvements in function in a reasonable and predictable amount of time. Frequency and Duration  04/27/2021 Speech Therapy Frequency (ACUTE ONLY) min 2x/week Treatment Duration 2 weeks   Oral Phase 04/27/2021 Oral Phase WFL Oral - Pudding Teaspoon -- Oral - Pudding Cup -- Oral - Honey Teaspoon -- Oral - Honey Cup -- Oral - Nectar Teaspoon -- Oral - Nectar Cup -- Oral - Nectar Straw -- Oral - Thin Teaspoon -- Oral - Thin Cup -- Oral - Thin Straw -- Oral - Puree -- Oral - Mech Soft -- Oral - Regular -- Oral - Multi-Consistency -- Oral - Pill -- Oral Phase - Comment --  Pharyngeal Phase 04/27/2021 Pharyngeal Phase Impaired Pharyngeal- Pudding Teaspoon -- Pharyngeal -- Pharyngeal- Pudding Cup -- Pharyngeal -- Pharyngeal- Honey Teaspoon -- Pharyngeal -- Pharyngeal- Honey Cup -- Pharyngeal -- Pharyngeal- Nectar Teaspoon NT Pharyngeal -- Pharyngeal- Nectar Cup -- Pharyngeal -- Pharyngeal- Nectar Straw -- Pharyngeal -- Pharyngeal- Thin Teaspoon -- Pharyngeal -- Pharyngeal- Thin Cup -- Pharyngeal -- Pharyngeal- Thin Straw -- Pharyngeal -- Pharyngeal- Puree -- Pharyngeal -- Pharyngeal- Mechanical Soft -- Pharyngeal -- Pharyngeal- Regular -- Pharyngeal -- Pharyngeal- Multi-consistency NT Pharyngeal -- Pharyngeal- Pill NT Pharyngeal -- Pharyngeal Comment --  No flowsheet data found. Herbie Baltimore, MA CCC-SLP Acute Rehabilitation Services Office [phone] Lynann Beaver 04/27/2021, 2:25 PM                     EEG adult  Result Date: 04/23/2021 Mickie Hillier, MD     04/23/2021  8:42 AM Telespecialist EEG Report  Routine EEG Date: 04/22/21 Read 04/23/21 Duration 22:02 Clinical Indication: encephalopathy r/o seizures  EEG Procedure:  This is a digitally recorded ROUTINE electroencephalogram. The international 10-20 electrode placement system is used for scalp electrode placement. Eighteen channels of scalp EEG are recorded. The data are stored digitally and reviewed in reformatted montages for optimal display.  EEG Description: This EEG is not well organized.  There is no clear posterior dominant rhythm.  The background consists of a mixture of a disorganized mixture of delta, theta, intermixed with faster frequencies.  There is no discreet sleep architecture.  There are no focal abnormalities, persistent asymmetries or epileptiform discharges. Activating procedures:  Photic stimulation was not performed.  Hyperventilation was not performed. EKG:  The EKG rhythm strip demonstrated a NSR at 78 bpm. EEG Classification:  Abnormal Background slowing, generalized EEG Interpretation: This routine EEG recorded in the comatose state is abnormal.  The background slowing suggest moderate to severe diffuse and/or multifocal cerebral dysfunction.  There are no focal abnormalities, persistent asymmetries or epileptiform discharges.    Microbiology: Results for orders placed or performed during the hospital encounter of 04/22/21  Resp Panel by RT-PCR (Flu A&B, Covid) Nasopharyngeal Swab     Status: Abnormal   Collection Time: 04/22/21 12:50 PM   Specimen: Nasopharyngeal Swab; Nasopharyngeal(NP) swabs in vial transport medium  Result Value Ref Range Status   SARS Coronavirus 2 by RT PCR POSITIVE (A) NEGATIVE Final    Comment: (NOTE) SARS-CoV-2 target nucleic acids are DETECTED.  The  SARS-CoV-2 RNA is generally detectable in upper respiratory specimens during the acute phase of infection. Positive results are indicative of the presence of the identified virus, but do not rule out bacterial infection or co-infection with other pathogens not detected by the test. Clinical correlation with patient history and other diagnostic  information is necessary to determine patient infection status. The expected result is Negative.  Fact Sheet for Patients: EntrepreneurPulse.com.au  Fact Sheet for Healthcare Providers: IncredibleEmployment.be  This test is not yet approved or cleared by the [place] FDA and  has been authorized for detection and/or diagnosis of SARS-CoV-2 by FDA under an Emergency Use Authorization (EUA).  This EUA will remain in effect (meaning this test can be used) for the duration of  the COVID-19 declaration under Section 564(b)(1) of the A ct, 21 U.S.C. section 360bbb-3(b)(1), unless the authorization is terminated or revoked sooner.     Influenza A by PCR NEGATIVE NEGATIVE Final   Influenza B by PCR NEGATIVE NEGATIVE Final    Comment: (NOTE) The Xpert Xpress SARS-CoV-2/FLU/RSV plus assay is intended as an aid in the diagnosis of influenza from Nasopharyngeal swab specimens and should not be used as a sole basis for treatment. Nasal washings and aspirates are unacceptable for Xpert Xpress SARS-CoV-2/FLU/RSV testing.  Fact Sheet for Patients: EntrepreneurPulse.com.au  Fact Sheet for Healthcare Providers: IncredibleEmployment.be  This test is not yet approved or cleared by the [place] FDA and has been authorized for detection and/or diagnosis of SARS-CoV-2 by FDA under an Emergency Use Authorization (EUA). This EUA will remain in effect (meaning this test can be used) for the duration of the COVID-19 declaration under Section 564(b)(1) of the Act, 21 U.S.C. section 360bbb-3(b)(1), unless the authorization is terminated or revoked.  Performed at [facility], [place] [address] [place], [place] [address]   Blood Culture (routine x 2)     Status: None   Collection Time: 04/22/21 12:50 PM   Specimen: BLOOD LEFT FOREARM  Result Value Ref Range Status   Specimen Description BLOOD LEFT FOREARM  Final    Special Requests   Final    BOTTLES DRAWN AEROBIC AND ANAEROBIC Blood Culture adequate volume   Culture   Final    NO GROWTH 5 DAYS Performed at [facility], [place] [address] [place], [place] [address]    Report Status 04/27/2021 FINAL  Final  Blood Culture (routine x 2)     Status: None   Collection Time: 04/22/21  2:05 PM   Specimen: BLOOD RIGHT FOREARM  Result Value Ref Range Status   Specimen Description BLOOD RIGHT FOREARM  Final   Special Requests   Final    BOTTLES DRAWN AEROBIC AND ANAEROBIC Blood Culture adequate volume   Culture   Final    NO GROWTH 5 DAYS Performed at [facility], [place] [address], [place], [place] [address]    Report Status 04/27/2021 FINAL  Final  Urine Culture     Status: None   Collection Time: 04/22/21  2:46 PM   Specimen: In/Out Cath Urine  Result Value Ref Range Status   Specimen Description IN/OUT CATH URINE  Final   Special Requests NONE  Final   Culture   Final    NO GROWTH Performed at [facility], [place] [address], [place],  [address]    Report Status 04/23/2021 FINAL  Final  MRSA Next Gen by PCR, Nasal     Status: None   Collection Time: 04/24/21  5:01 AM   Specimen: Nasal Mucosa; Nasal Swab  Result Value Ref Range Status   MRSA by PCR Next Gen NOT DETECTED NOT DETECTED Final    Comment: (NOTE) The GeneXpert MRSA Assay (FDA approved for NASAL specimens only), is one component of a comprehensive MRSA colonization surveillance program. It is not intended to diagnose MRSA infection nor to guide or monitor treatment for MRSA infections. Test performance is not FDA approved in patients less than 60 years old. Performed at [facility], [place] [address], [place], [place] [address]   Resp Panel by RT-PCR (Flu A&B, Covid) Nasopharyngeal Swab     Status: None   Collection Time: 04/24/21 11:15 AM   Specimen: Nasopharyngeal Swab; Nasopharyngeal(NP) swabs in vial transport medium  Result Value Ref Range Status    SARS Coronavirus 2 by RT PCR NEGATIVE NEGATIVE Final    Comment: (NOTE) SARS-CoV-2 target nucleic acids are NOT DETECTED.  The SARS-CoV-2 RNA is generally detectable in upper respiratory specimens during the acute phase of infection. The lowest concentration of SARS-CoV-2 viral copies this assay can detect is 138 copies/mL. A negative result does not preclude SARS-Cov-2 infection and should not be used as the sole basis for treatment or other patient management decisions. A negative result may occur with  improper specimen collection/handling, submission of specimen other than nasopharyngeal swab, presence of viral mutation(s) within the areas targeted by this assay, and inadequate number of viral copies(<138 copies/mL). A negative result must be combined with clinical observations, patient history, and epidemiological information. The expected result is Negative.  Fact Sheet for Patients:  EntrepreneurPulse.com.au  Fact Sheet for Healthcare Providers:  IncredibleEmployment.be  This test is no t yet approved or cleared by the [place] FDA and  has been authorized for detection and/or diagnosis of SARS-CoV-2 by FDA under an Emergency Use Authorization (EUA). This EUA will remain  in effect (meaning this test can be used) for the duration of the COVID-19 declaration under Section 564(b)(1) of the Act, 21 U.S.C.section 360bbb-3(b)(1), unless the authorization is terminated  or revoked sooner.       Influenza A by PCR NEGATIVE NEGATIVE Final   Influenza B by PCR NEGATIVE NEGATIVE Final    Comment: (NOTE) The Xpert Xpress SARS-CoV-2/FLU/RSV plus assay is intended as an aid in the diagnosis of influenza from Nasopharyngeal swab specimens and should not be used as a sole basis for treatment. Nasal washings and aspirates are unacceptable for Xpert Xpress SARS-CoV-2/FLU/RSV testing.  Fact Sheet for  Patients: EntrepreneurPulse.com.au  Fact Sheet for Healthcare Providers: IncredibleEmployment.be  This test is not yet approved or cleared by the [place] FDA and has been authorized for detection and/or diagnosis of SARS-CoV-2 by FDA under an Emergency Use Authorization (EUA). This EUA will remain in effect (meaning this test can be used) for the duration of the COVID-19 declaration under Section 564(b)(1) of the Act, 21 U.S.C. section 360bbb-3(b)(1), unless the authorization is terminated or revoked.  Performed at [facility], [place] [address] [place], [place] [address]     Labs: CBC: Recent Labs  Lab 04/22/21 1308 04/22/21 2038 04/23/21 0500 04/24/21 0602 04/25/21 0217 04/26/21 0203  WBC 14.3*  --  10.6* 8.8 8.4 6.2  NEUTROABS 12.6*  --  8.0* 6.6 5.1 3.8  HGB 13.7 13.6 12.3* 12.5* 11.7* 10.7*  HCT 42.3 40.0 38.2* 37.6* 36.2* 33.6*  MCV 90.0  --  91.0 88.3 90.5 89.8  PLT 283  --  223 227 216 210  Basic Metabolic Panel: Recent Labs  Lab 04/22/21 1308 04/22/21 2038 04/23/21 0500 04/24/21 0602 04/25/21 0217 04/26/21 0203  NA 135 130* 133* 131* 139 136  K 5.0 4.9 4.4 4.0 3.9 3.7  CL 100  --  100 97* 103 101  CO2 25  --  24 23 29 27   GLUCOSE 187*  --  170* 148* 121* 112*  BUN 22  --  17 19 19 18   CREATININE 0.86  --  0.85 0.86 0.94 0.81  CALCIUM 9.2  --  8.8* 8.7* 8.9 8.5*  MG  --   --  1.3* 1.6* 2.1 1.7  PHOS  --   --  2.9 3.3 3.6 2.7   Liver Function Tests: Recent Labs  Lab 04/22/21 1308 04/23/21 0500 04/24/21 0602 04/25/21 0217 04/26/21 0203  AST 16 12* 13* 11* 13*  ALT 11 10 10 10 12   ALKPHOS 87 67 63 66 62  BILITOT 0.7 0.5 0.8 0.6 0.6  PROT 7.2 6.1* 5.9* 5.7* 5.2*  ALBUMIN 3.7 3.1* 3.0* 2.8* 2.8*   CBG: Recent Labs  Lab 04/27/21 1154 04/27/21 1627 04/27/21 2119 04/28/21 0628 04/28/21 0811  GLUCAP 130* 163* 78 144* 177*    Discharge time spent: 39 minutes.   Signed: Hosie Poisson,  MD [facility] 04/28/2021

## 2021-04-28 NOTE — Progress Notes (Signed)
Speech Language Pathology Treatment: Dysphagia  Patient Details Name: Jeffery Haynes MRN: [id number] DOB: 1931/08/08 Today's Date: 04/28/2021 Time: 0910-0930 SLP Time Calculation (min) (ACUTE ONLY): 20 min  Assessment / Plan / Recommendation Clinical Impression  Mr. Daughdrill was seen at bedside for dysphagia treatment. Pt has baseline dementia and required total assist during mealtime. SLP observed PO intake with Dys 3 breakfast tray and thin liquids. Observed that when alternating between liquids/solids, pt exhibited frequent coughing episodes indicative of penetration/aspiration. Pt tolerated individual bites of 1 texture (I.e., grits/banana) and then receiving a drink following the mealtime. Pt adequately accepted all boluses. Mastication was [facility] and oral clearance adequate. SLP incorporated intermittent throat clearing cues after pt received small sips of water. Pt benefits from  a model to clear cough and then a verbal/tactile cue to swallow. SLP recommends continuing patient on Dys 3 diet with thin liquids pending discharge.   HPI HPI: Pt is an 88 yo male presenting from SNF with AMS. COVID positive. CT Chest and MRI Brain without acute findings. Previous clinical swallowing evaluations in March and October 2022 both revealed relatively functional swallowing despite cognitive deficits with regular solids and thin liquids recommended (meds crushed in puree per family). PMH includes: advanced dementia, stroke with residual L sided weakness, DM, HTN, BPH      SLP Plan  Continue with current plan of care      Recommendations for follow up therapy are one component of a multi-disciplinary discharge planning process, led by the attending physician.  Recommendations may be updated based on patient status, additional functional criteria and insurance authorization.    Recommendations  Diet recommendations: Dysphagia 3 (mechanical soft);Thin liquid Liquids provided via: Straw;Cup Medication  Administration: Whole meds with puree Supervision: Staff to assist with self feeding;Full supervision/cueing for compensatory strategies Compensations: Minimize environmental distractions;Slow rate;Small sips/bites;Clear throat intermittently;Chin tuck Postural Changes and/or Swallow Maneuvers: Seated upright 90 degrees;Upright 30-60 min after meal                Oral Care Recommendations: Oral care BID Follow Up Recommendations: Skilled nursing-short term rehab (<3 hours/day) Assistance recommended at discharge: Frequent or constant Supervision/Assistance SLP Visit Diagnosis: Dysphagia, oropharyngeal phase (R13.12) Plan: Continue with current plan of care           [facility]  04/28/2021, 9:34 AM

## 2021-04-28 NOTE — Progress Notes (Signed)
Pt discharging to SNF, [facility]. No family present at discharge today.  Discharge packet sent with transporters for facility.  Pt d/c'd with belongings, transported by [facility].     At [id number] report was given to nurse Colin Mulders at [facility] facility.
